# Patient Record
Sex: Female | Born: 1965 | Race: White | Hispanic: No | Marital: Married | Smoking: Former smoker
Health system: Southern US, Community
[De-identification: ages and names within clinical notes are randomized; demographics above are authoritative.]

## PROBLEM LIST (undated history)

## (undated) DIAGNOSIS — T7840XA Allergy, unspecified, initial encounter: Secondary | ICD-10-CM

## (undated) DIAGNOSIS — M1990 Unspecified osteoarthritis, unspecified site: Secondary | ICD-10-CM

## (undated) DIAGNOSIS — F419 Anxiety disorder, unspecified: Secondary | ICD-10-CM

## (undated) DIAGNOSIS — J45909 Unspecified asthma, uncomplicated: Secondary | ICD-10-CM

## (undated) DIAGNOSIS — F4310 Post-traumatic stress disorder, unspecified: Secondary | ICD-10-CM

## (undated) DIAGNOSIS — R0902 Hypoxemia: Secondary | ICD-10-CM

## (undated) DIAGNOSIS — J449 Chronic obstructive pulmonary disease, unspecified: Secondary | ICD-10-CM

## (undated) DIAGNOSIS — F32A Depression, unspecified: Secondary | ICD-10-CM

## (undated) DIAGNOSIS — J439 Emphysema, unspecified: Secondary | ICD-10-CM

## (undated) HISTORY — DX: Chronic obstructive pulmonary disease, unspecified: J44.9

## (undated) HISTORY — DX: Hypoxemia: R09.02

## (undated) HISTORY — DX: Depression, unspecified: F32.A

## (undated) HISTORY — PX: ABDOMINAL HYSTERECTOMY: SHX81

## (undated) HISTORY — DX: Post-traumatic stress disorder, unspecified: F43.10

## (undated) HISTORY — DX: Anxiety disorder, unspecified: F41.9

## (undated) HISTORY — PX: OTHER SURGICAL HISTORY: SHX169

## (undated) HISTORY — PX: BREAST EXCISIONAL BIOPSY: SUR124

## (undated) HISTORY — DX: Unspecified osteoarthritis, unspecified site: M19.90

## (undated) HISTORY — DX: Allergy, unspecified, initial encounter: T78.40XA

## (undated) HISTORY — PX: TONSILLECTOMY: SUR1361

## (undated) HISTORY — DX: Emphysema, unspecified: J43.9

---

## 1998-09-30 ENCOUNTER — Inpatient Hospital Stay (HOSPITAL_COMMUNITY): Admission: RE | Admit: 1998-09-30 | Discharge: 1998-10-01 | Payer: Self-pay | Admitting: Gynecology

## 1998-09-30 ENCOUNTER — Encounter (INDEPENDENT_AMBULATORY_CARE_PROVIDER_SITE_OTHER): Payer: Self-pay | Admitting: Specialist

## 1999-09-09 ENCOUNTER — Other Ambulatory Visit: Admission: RE | Admit: 1999-09-09 | Discharge: 1999-09-09 | Payer: Self-pay | Admitting: Gynecology

## 2001-05-16 ENCOUNTER — Inpatient Hospital Stay (HOSPITAL_COMMUNITY): Admission: EM | Admit: 2001-05-16 | Discharge: 2001-05-21 | Payer: Self-pay | Admitting: Psychiatry

## 2001-05-22 ENCOUNTER — Other Ambulatory Visit (HOSPITAL_COMMUNITY): Admission: RE | Admit: 2001-05-22 | Discharge: 2001-06-04 | Payer: Self-pay | Admitting: Psychiatry

## 2005-08-27 ENCOUNTER — Emergency Department (HOSPITAL_COMMUNITY): Admission: EM | Admit: 2005-08-27 | Discharge: 2005-08-27 | Payer: Self-pay | Admitting: Emergency Medicine

## 2009-02-20 ENCOUNTER — Emergency Department (HOSPITAL_COMMUNITY): Admission: EM | Admit: 2009-02-20 | Discharge: 2009-02-20 | Payer: Self-pay | Admitting: Family Medicine

## 2009-10-12 ENCOUNTER — Emergency Department (HOSPITAL_COMMUNITY): Admission: EM | Admit: 2009-10-12 | Discharge: 2009-10-12 | Payer: Self-pay | Admitting: Family Medicine
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
## 2013-07-24 ENCOUNTER — Encounter (HOSPITAL_COMMUNITY): Payer: Self-pay | Admitting: Emergency Medicine

## 2013-07-24 ENCOUNTER — Emergency Department (HOSPITAL_COMMUNITY): Payer: BC Managed Care – PPO

## 2013-07-24 ENCOUNTER — Emergency Department (HOSPITAL_COMMUNITY)
Admission: EM | Admit: 2013-07-24 | Discharge: 2013-07-24 | Disposition: A | Discharge status: Home / Self Care | Payer: BC Managed Care – PPO | Attending: Emergency Medicine | Admitting: Emergency Medicine

## 2013-07-24 DIAGNOSIS — F17200 Nicotine dependence, unspecified, uncomplicated: Secondary | ICD-10-CM | POA: Insufficient documentation

## 2013-07-24 DIAGNOSIS — Y929 Unspecified place or not applicable: Secondary | ICD-10-CM | POA: Insufficient documentation

## 2013-07-24 DIAGNOSIS — X500XXA Overexertion from strenuous movement or load, initial encounter: Secondary | ICD-10-CM | POA: Insufficient documentation

## 2013-07-24 DIAGNOSIS — S93401A Sprain of unspecified ligament of right ankle, initial encounter: Secondary | ICD-10-CM

## 2013-07-24 DIAGNOSIS — S93409A Sprain of unspecified ligament of unspecified ankle, initial encounter: Secondary | ICD-10-CM | POA: Insufficient documentation

## 2013-07-24 DIAGNOSIS — Y9389 Activity, other specified: Secondary | ICD-10-CM | POA: Insufficient documentation

## 2013-07-24 MED ORDER — IBUPROFEN 800 MG PO TABS: 800.0000 mg | ORAL_TABLET | Freq: Three times a day (TID) | ORAL | Status: DC

## 2013-07-24 NOTE — Discharge Instructions (Signed)
Ankle Sprain °An ankle sprain is an injury to the strong, fibrous tissues (ligaments) that hold the bones of your ankle joint together.  °CAUSES °An ankle sprain is usually caused by a fall or by twisting your ankle. Ankle sprains most commonly occur when you step on the outer edge of your foot, and your ankle turns inward. People who participate in sports are more prone to these types of injuries.  °SYMPTOMS  °· Pain in your ankle. The pain may be present at rest or only when you are trying to stand or walk. °· Swelling. °· Bruising. Bruising may develop immediately or within 1 to 2 days after your injury. °· Difficulty standing or walking, particularly when turning corners or changing directions. °DIAGNOSIS  °Your caregiver will ask you details about your injury and perform a physical exam of your ankle to determine if you have an ankle sprain. During the physical exam, your caregiver will press on and apply pressure to specific areas of your foot and ankle. Your caregiver will try to move your ankle in certain ways. An X-ray exam may be done to be sure a bone was not broken or a ligament did not separate from one of the bones in your ankle (avulsion fracture).  °TREATMENT  °Certain types of braces can help stabilize your ankle. Your caregiver can make a recommendation for this. Your caregiver may recommend the use of medicine for pain. If your sprain is severe, your caregiver may refer you to a surgeon who helps to restore function to parts of your skeletal system (orthopedist) or a physical therapist. °HOME CARE INSTRUCTIONS  °· Apply ice to your injury for 1-2 days or as directed by your caregiver. Applying ice helps to reduce inflammation and pain. °¨ Put ice in a plastic bag. °¨ Place a towel between your skin and the bag. °¨ Leave the ice on for 15-20 minutes at a time, every 2 hours while you are awake. °· Only take over-the-counter or prescription medicines for pain, discomfort, or fever as directed by  your caregiver. °· Elevate your injured ankle above the level of your heart as much as possible for 2-3 days. °· If your caregiver recommends crutches, use them as instructed. Gradually put weight on the affected ankle. Continue to use crutches or a cane until you can walk without feeling pain in your ankle. °· If you have a plaster splint, wear the splint as directed by your caregiver. Do not rest it on anything harder than a pillow for the first 24 hours. Do not put weight on it. Do not get it wet. You may take it off to take a shower or bath. °· You may have been given an elastic bandage to wear around your ankle to provide support. If the elastic bandage is too tight (you have numbness or tingling in your foot or your foot becomes cold and blue), adjust the bandage to make it comfortable. °· If you have an air splint, you may blow more air into it or let air out to make it more comfortable. You may take your splint off at night and before taking a shower or bath. Wiggle your toes in the splint several times per day to decrease swelling. °SEEK MEDICAL CARE IF:  °· You have rapidly increasing bruising or swelling. °· Your toes feel extremely cold or you lose feeling in your foot. °· Your pain is not relieved with medicine. °SEEK IMMEDIATE MEDICAL CARE IF: °· Your toes are numb or blue. °·   You have severe pain that is increasing. °MAKE SURE YOU:  °· Understand these instructions. °· Will watch your condition. °· Will get help right away if you are not doing well or get worse. °Document Released: 12/20/2004 Document Revised: 09/14/2011 Document Reviewed: 01/01/2011 °ExitCare® Patient Information ©2015 [employer]. This information is not intended to replace advice given to you by your health care provider. Make sure you discuss any questions you have with your health care provider. ° °Cryotherapy °Cryotherapy means treatment with cold. Ice or gel packs can be used to reduce both pain and swelling. Ice is the most  helpful within the first 24 to 48 hours after an injury or flareup from overusing a muscle or joint. Sprains, strains, spasms, burning pain, shooting pain, and aches can all be eased with ice. Ice can also be used when recovering from surgery. Ice is effective, has very few side effects, and is safe for most people to use. °PRECAUTIONS  °Ice is not a safe treatment option for people with: °· Raynaud's phenomenon. This is a condition affecting small blood vessels in the extremities. Exposure to cold may cause your problems to return. °· Cold hypersensitivity. There are many forms of cold hypersensitivity, including: °¨ Cold urticaria. Red, itchy hives appear on the skin when the tissues begin to warm after being iced. °¨ Cold erythema. This is a red, itchy rash caused by exposure to cold. °¨ Cold hemoglobinuria. Red blood cells break down when the tissues begin to warm after being iced. The hemoglobin that carry oxygen are passed into the urine because they cannot combine with blood proteins fast enough. °· Numbness or altered sensitivity in the area being iced. °If you have any of the following conditions, do not use ice until you have discussed cryotherapy with your caregiver: °· Heart conditions, such as arrhythmia, angina, or chronic heart disease. °· High blood pressure. °· Healing wounds or open skin in the area being iced. °· Current infections. °· Rheumatoid arthritis. °· Poor circulation. °· Diabetes. °Ice slows the blood flow in the region it is applied. This is beneficial when trying to stop inflamed tissues from spreading irritating chemicals to surrounding tissues. However, if you expose your skin to cold temperatures for too long or without the proper protection, you can damage your skin or nerves. Watch for signs of skin damage due to cold. °HOME CARE INSTRUCTIONS °Follow these tips to use ice and cold packs safely. °· Place a dry or damp towel between the ice and skin. A damp towel will cool the skin  more quickly, so you may need to shorten the time that the ice is used. °· For a more rapid response, add gentle compression to the ice. °· Ice for no more than 10 to 20 minutes at a time. The bonier the area you are icing, the less time it will take to get the benefits of ice. °· Check your skin after 5 minutes to make sure there are no signs of a poor response to cold or skin damage. °· Rest 20 minutes or more in between uses. °· Once your skin is numb, you can end your treatment. You can test numbness by very lightly touching your skin. The touch should be so light that you do not see the skin dimple from the pressure of your fingertip. When using ice, most people will feel these normal sensations in this order: cold, burning, aching, and numbness. °· Do not use ice on someone who cannot communicate their responses   to pain, such as small children or people with dementia. °HOW TO MAKE AN ICE PACK °Ice packs are the most common way to use ice therapy. Other methods include ice massage, ice baths, and cryo-sprays. Muscle creams that cause a cold, tingly feeling do not offer the same benefits that ice offers and should not be used as a substitute unless recommended by your caregiver. °To make an ice pack, do one of the following: °· Place crushed ice or a bag of frozen vegetables in a sealable plastic bag. Squeeze out the excess air. Place this bag inside another plastic bag. Slide the bag into a pillowcase or place a damp towel between your skin and the bag. °· Mix 3 parts water with 1 part rubbing alcohol. Freeze the mixture in a sealable plastic bag. When you remove the mixture from the freezer, it will be slushy. Squeeze out the excess air. Place this bag inside another plastic bag. Slide the bag into a pillowcase or place a damp towel between your skin and the bag. °SEEK MEDICAL CARE IF: °· You develop white spots on your skin. This may give the skin a blotchy (mottled) appearance. °· Your skin turns blue or  pale. °· Your skin becomes waxy or hard. °· Your swelling gets worse. °MAKE SURE YOU:  °· Understand these instructions. °· Will watch your condition. °· Will get help right away if you are not doing well or get worse. °Document Released: 08/16/2010 Document Revised: 03/14/2011 Document Reviewed: 08/16/2010 °ExitCare® Patient Information ©2015 [employer]. This information is not intended to replace advice given to you by your health care provider. Make sure you discuss any questions you have with your health care provider. ° °

## 2013-07-24 NOTE — ED Provider Notes (Signed)
Medical screening examination/treatment/procedure(s) were performed by non-physician practitioner and as supervising physician I was immediately available for consultation/collaboration.   EKG Interpretation None      Devoria AlbeIva Latoya Diskin, MD, Armando GangFACEP   Ward GivensIva L Vinie Charity, MD 07/24/13 [phone]

## 2013-07-24 NOTE — ED Notes (Signed)
Pt presents with c/o right foot injury. Pt says that around 7 this morning she stepped on to some uneven concrete, has had trouble putting pressure on the foot since then. Pt does have some swelling to that foot.

## 2013-07-24 NOTE — ED Notes (Signed)
Patient transported to X-ray 

## 2013-07-24 NOTE — ED Provider Notes (Signed)
CSN: [id number]     Arrival date & time 07/24/13  [id number] History   First MD Initiated Contact with Patient 07/24/13 1014     Chief Complaint  Patient presents with  . Foot Injury     (Consider location/radiation/quality/duration/timing/severity/associated sxs/prior Treatment) Patient is a 48 y.o. female presenting with foot injury. The history is provided by the patient. No language interpreter was used.  Foot Injury Location:  Ankle Ankle location:  R ankle Associated symptoms: no fever   Associated symptoms comment:  Right ankle swelling after inversion injury earlier this morning. No other injury.    History reviewed. No pertinent past medical history. Past Surgical History  Procedure Laterality Date  . Abdominal hysterectomy     No family history on file. History  Substance Use Topics  . Smoking status: Current Every Day Smoker  . Smokeless tobacco: Not on file  . Alcohol Use: Yes     Comment: rarely    OB History   Grav Para Term Preterm Abortions TAB SAB Ect Mult Living                 Review of Systems  Constitutional: Negative for fever and chills.  Musculoskeletal:       See HPI.  Skin: Negative.   Neurological: Negative.  Negative for numbness.      Allergies  Codeine  Home Medications   Prior to Admission medications   Not on File   BP 115/78  Pulse 82  Temp(Src) 97.9 F (36.6 C) (Oral)  Resp 14  SpO2 98% Physical Exam  Constitutional: She is oriented to person, place, and time. She appears well-developed and well-nourished.  Neck: Normal range of motion.  Pulmonary/Chest: Effort normal.  Musculoskeletal:  Right ankle swollen laterally without discoloration or bony deformity. Joint stable. Achilles intact. FROM distally.   Neurological: She is alert and oriented to person, place, and time.  Skin: Skin is warm and dry.  Psychiatric: She has a normal mood and affect.    ED Course  Procedures (including critical care time) Labs  Review Labs Reviewed - No data to display  Imaging Review Dg Ankle Complete Right  07/24/2013   CLINICAL DATA:  Lateral right ankle pain and swelling status post injury  EXAM: RIGHT ANKLE - COMPLETE 3+ VIEW  COMPARISON:  None.  FINDINGS: The ankle joint mortise is preserved. The talar dome is intact. There is no acute malleolar fracture. There is soft tissue swelling laterally. There is a 2.5 x 4.5 mm bony density adjacent to the inferior aspect of the distal fibula and the adjacent talus. A definite avulsion fracture donor site is not demonstrated. The other hindfoot bones are normal.  IMPRESSION: There is soft tissue swelling laterally. There is no definite evidence of an acute fracture. An accessory fibular ossicle or old avulsion fracture fragment is present along the inferior aspect of the distal fibula.   Electronically Signed   By: David  SwazilandJordan   On: 07/24/2013 11:28     EKG Interpretation None      MDM   Final diagnoses:  None    1. Right ankle sprain  Uncomplicated ankle sprain without suspicion of worse underlying injury.    Arnoldo HookerShari A Atilla Zollner, PA-C 07/24/13 1158

## 2013-10-29 ENCOUNTER — Encounter (HOSPITAL_COMMUNITY): Payer: Self-pay | Admitting: Emergency Medicine

## 2013-10-29 ENCOUNTER — Emergency Department (HOSPITAL_COMMUNITY)
Admission: EM | Admit: 2013-10-29 | Discharge: 2013-10-29 | Disposition: A | Discharge status: Home / Self Care | Payer: BC Managed Care – PPO | Source: Home / Self Care | Attending: Family Medicine | Admitting: Family Medicine

## 2013-10-29 ENCOUNTER — Ambulatory Visit (HOSPITAL_COMMUNITY): Payer: BC Managed Care – PPO | Attending: Family Medicine

## 2013-10-29 DIAGNOSIS — R0602 Shortness of breath: Secondary | ICD-10-CM | POA: Diagnosis not present

## 2013-10-29 DIAGNOSIS — R05 Cough: Secondary | ICD-10-CM | POA: Insufficient documentation

## 2013-10-29 DIAGNOSIS — J441 Chronic obstructive pulmonary disease with (acute) exacerbation: Secondary | ICD-10-CM

## 2013-10-29 DIAGNOSIS — R509 Fever, unspecified: Secondary | ICD-10-CM | POA: Diagnosis not present

## 2013-10-29 HISTORY — DX: Unspecified asthma, uncomplicated: J45.909

## 2013-10-29 MED ORDER — IPRATROPIUM BROMIDE 0.02 % IN SOLN
RESPIRATORY_TRACT | Status: AC
  Filled 2013-10-29: qty 2.5

## 2013-10-29 MED ORDER — ALBUTEROL SULFATE (2.5 MG/3ML) 0.083% IN NEBU
5.0000 mg | INHALATION_SOLUTION | Freq: Once | RESPIRATORY_TRACT | Status: AC
  Administered 2013-10-29: 5 mg via RESPIRATORY_TRACT

## 2013-10-29 MED ORDER — MINOCYCLINE HCL 100 MG PO CAPS: 100.0000 mg | ORAL_CAPSULE | Freq: Two times a day (BID) | ORAL | Status: DC

## 2013-10-29 MED ORDER — METHYLPREDNISOLONE 4 MG PO KIT: PACK | ORAL | Status: DC

## 2013-10-29 MED ORDER — METHYLPREDNISOLONE SODIUM SUCC 125 MG IJ SOLR
INTRAMUSCULAR | Status: AC
  Filled 2013-10-29: qty 2

## 2013-10-29 MED ORDER — ALBUTEROL SULFATE (2.5 MG/3ML) 0.083% IN NEBU
INHALATION_SOLUTION | RESPIRATORY_TRACT | Status: AC
  Filled 2013-10-29: qty 6

## 2013-10-29 MED ORDER — METHYLPREDNISOLONE SODIUM SUCC 125 MG IJ SOLR
125.0000 mg | Freq: Once | INTRAMUSCULAR | Status: AC
  Administered 2013-10-29: 125 mg via INTRAMUSCULAR

## 2013-10-29 MED ORDER — IPRATROPIUM BROMIDE 0.02 % IN SOLN
0.5000 mg | Freq: Once | RESPIRATORY_TRACT | Status: AC
  Administered 2013-10-29: 0.5 mg via RESPIRATORY_TRACT

## 2013-10-29 NOTE — ED Provider Notes (Signed)
CSN: [id number]     Arrival date & time 10/29/13  1122 History   First MD Initiated Contact with Patient 10/29/13 1133     Chief Complaint  Patient presents with  . Cough   (Consider location/radiation/quality/duration/timing/severity/associated sxs/prior Treatment) Patient is a 48 y.o. female presenting with shortness of breath. The history is provided by the patient.  Shortness of Breath Severity:  Moderate Onset quality:  Gradual Duration:  1 week Progression:  Worsening Chronicity:  Chronic Context: smoke exposure   Ineffective treatments:  Inhaler Associated symptoms: wheezing   Associated symptoms: no fever   Risk factors: tobacco use     Past Medical History  Diagnosis Date  . Asthma    Past Surgical History  Procedure Laterality Date  . Abdominal hysterectomy    . Tonsillectomy     History reviewed. No pertinent family history. History  Substance Use Topics  . Smoking status: Current Every Day Smoker -- 1.00 packs/day  . Smokeless tobacco: Not on file  . Alcohol Use: Yes     Comment: rarely    OB History   Grav Para Term Preterm Abortions TAB SAB Ect Mult Living                 Review of Systems  Constitutional: Negative.  Negative for fever.  Respiratory: Positive for chest tightness, shortness of breath and wheezing.   Cardiovascular: Negative.  Negative for leg swelling.  Gastrointestinal: Negative.     Allergies  Codeine  Home Medications   Prior to Admission medications   Medication Sig Start Date End Date Taking? Authorizing Provider  ibuprofen (ADVIL,MOTRIN) 800 MG tablet Take 1 tablet (800 mg total) by mouth 3 (three) times daily. 07/24/13   Shari A Upstill, PA-C  methylPREDNISolone (MEDROL DOSEPAK) 4 MG tablet follow package directions, take until finished, start on wed. 10/29/13   Linna HoffJames D Jaquia Benedicto, MD  minocycline (MINOCIN,DYNACIN) 100 MG capsule Take 1 capsule (100 mg total) by mouth 2 (two) times daily. 10/29/13   Linna HoffJames D Jean Alejos, MD   BP  123/85  Pulse 76  Temp(Src) 99 F (37.2 C) (Oral)  Resp 16  SpO2 97% Physical Exam  Nursing note and vitals reviewed. Constitutional: She is oriented to person, place, and time. She appears well-developed and well-nourished. She appears distressed.  HENT:  Right Ear: External ear normal.  Left Ear: External ear normal.  Mouth/Throat: Oropharynx is clear and moist.  Eyes: Conjunctivae are normal. Pupils are equal, round, and reactive to light.  Neck: Normal range of motion. Neck supple.  Cardiovascular: Regular rhythm, normal heart sounds and intact distal pulses.   Pulmonary/Chest: Tachypnea noted. No respiratory distress. She has decreased breath sounds. She has wheezes.  Abdominal: Soft. Bowel sounds are normal.  Lymphadenopathy:    She has no cervical adenopathy.  Neurological: She is alert and oriented to person, place, and time.  Skin: Skin is warm and dry.    ED Course  Procedures (including critical care time) Labs Review Labs Reviewed - No data to display  Imaging Review Dg Chest 2 View  10/29/2013   CLINICAL DATA:  Six day history of productive cough and low grade fever. Shortness of breath beginning 3 days ago. Thirty year history of smoking.  EXAM: CHEST  2 VIEW  COMPARISON:  02/20/2009  FINDINGS: Lungs are hyperexpanded with flattened hemidiaphragms consistent with COPD. No lung consolidation or edema. No lung mass or nodule. Mild scarring is noted in the upper lobes at the apices, unchanged.  No  pleural effusion or pneumothorax.  Heart, mediastinum hila are unremarkable.  Bony thorax is intact.  IMPRESSION: 1. No acute cardiopulmonary disease. 2. COPD.   Electronically Signed   By: Amie Portlandavid  Ormond M.D.   On: 10/29/2013 12:52   X-rays reviewed and report per radiologist.   MDM   1. SOB (shortness of breath)   2. Acute exacerbation of chronic obstructive airways disease    Sx improved, lungs clear at time of d/c    Linna HoffJames D Creighton Longley, MD 10/29/13 [phone]

## 2013-10-29 NOTE — ED Notes (Signed)
Pt states that she has been coughing for over a week with no relief. Cough is continues to get worse. Pt denies pain at this time.

## 2013-10-29 NOTE — Discharge Instructions (Signed)
Take all of medicine, drink lots of fluids, no more smoking, see your doctor if further problems  °

## 2015-05-19 ENCOUNTER — Ambulatory Visit (INDEPENDENT_AMBULATORY_CARE_PROVIDER_SITE_OTHER): Payer: BLUE CROSS/BLUE SHIELD

## 2015-05-19 ENCOUNTER — Encounter (HOSPITAL_COMMUNITY): Payer: Self-pay | Admitting: *Deleted

## 2015-05-19 ENCOUNTER — Ambulatory Visit (HOSPITAL_COMMUNITY)
Admission: EM | Admit: 2015-05-19 | Discharge: 2015-05-19 | Disposition: A | Discharge status: Home / Self Care | Payer: BLUE CROSS/BLUE SHIELD | Attending: Family Medicine | Admitting: Family Medicine

## 2015-05-19 DIAGNOSIS — J441 Chronic obstructive pulmonary disease with (acute) exacerbation: Secondary | ICD-10-CM

## 2015-05-19 MED ORDER — PREDNISONE 50 MG PO TABS: ORAL_TABLET | ORAL | Status: DC

## 2015-05-19 MED ORDER — TIOTROPIUM BROMIDE MONOHYDRATE 18 MCG IN CAPS
18.0000 ug | ORAL_CAPSULE | Freq: Every morning | RESPIRATORY_TRACT | Status: DC
Start: 2015-05-19 — End: 2015-05-19

## 2015-05-19 MED ORDER — METHYLPREDNISOLONE SODIUM SUCC 125 MG IJ SOLR
125.0000 mg | Freq: Once | INTRAMUSCULAR | Status: AC
  Administered 2015-05-19: 125 mg via INTRAMUSCULAR

## 2015-05-19 MED ORDER — TIOTROPIUM BROMIDE MONOHYDRATE 18 MCG IN CAPS: 18.0000 ug | ORAL_CAPSULE | Freq: Every morning | RESPIRATORY_TRACT | Status: DC

## 2015-05-19 MED ORDER — IPRATROPIUM BROMIDE 0.02 % IN SOLN
0.5000 mg | Freq: Once | RESPIRATORY_TRACT | Status: AC
  Administered 2015-05-19: 0.5 mg via RESPIRATORY_TRACT

## 2015-05-19 MED ORDER — ALBUTEROL SULFATE (2.5 MG/3ML) 0.083% IN NEBU
INHALATION_SOLUTION | RESPIRATORY_TRACT | Status: AC
  Filled 2015-05-19: qty 6

## 2015-05-19 MED ORDER — ALBUTEROL SULFATE (2.5 MG/3ML) 0.083% IN NEBU
5.0000 mg | INHALATION_SOLUTION | Freq: Once | RESPIRATORY_TRACT | Status: AC
  Administered 2015-05-19: 5 mg via RESPIRATORY_TRACT

## 2015-05-19 MED ORDER — METHYLPREDNISOLONE SODIUM SUCC 125 MG IJ SOLR
INTRAMUSCULAR | Status: AC
  Filled 2015-05-19: qty 2

## 2015-05-19 MED ORDER — IPRATROPIUM BROMIDE 0.02 % IN SOLN
RESPIRATORY_TRACT | Status: AC
  Filled 2015-05-19: qty 2.5

## 2015-05-19 NOTE — Discharge Instructions (Signed)
Drink plenty of water, use medicine as prescribed, see your doctor as needed.

## 2015-05-19 NOTE — ED Provider Notes (Signed)
CSN: [id number]     Arrival date & time 05/19/15  1751 History   First MD Initiated Contact with Patient 05/19/15 1820     Chief Complaint  Patient presents with  . Cough   (Consider location/radiation/quality/duration/timing/severity/associated sxs/prior Treatment) Patient is a 50 y.o. female presenting with cough. The history is provided by the patient and the spouse.  Cough Cough characteristics:  Productive Sputum characteristics:  Yellow Severity:  Moderate Onset quality:  Gradual Duration:  4 weeks Progression:  Worsening Chronicity:  Chronic Smoker: yes   Ineffective treatments:  Beta-agonist inhaler Associated symptoms: shortness of breath and wheezing   Associated symptoms: no fever     Past Medical History  Diagnosis Date  . Asthma    Past Surgical History  Procedure Laterality Date  . Abdominal hysterectomy    . Tonsillectomy     History reviewed. No pertinent family history. Social History  Substance Use Topics  . Smoking status: Current Every Day Smoker -- 1.00 packs/day  . Smokeless tobacco: None  . Alcohol Use: Yes     Comment: rarely    OB History    No data available     Review of Systems  Constitutional: Negative.  Negative for fever.  HENT: Positive for congestion and postnasal drip.   Respiratory: Positive for cough, shortness of breath and wheezing.   Cardiovascular: Negative.   All other systems reviewed and are negative.   Allergies  Codeine  Home Medications   Prior to Admission medications   Medication Sig Start Date End Date Taking? Authorizing Provider  ibuprofen (ADVIL,MOTRIN) 800 MG tablet Take 1 tablet (800 mg total) by mouth 3 (three) times daily. 07/24/13   Elpidio AnisShari Upstill, PA-C  methylPREDNISolone (MEDROL DOSEPAK) 4 MG tablet follow package directions, take until finished, start on wed. 10/29/13   Linna HoffJames D Borghild Thaker, MD  minocycline (MINOCIN,DYNACIN) 100 MG capsule Take 1 capsule (100 mg total) by mouth 2 (two) times daily.  10/29/13   Linna HoffJames D Ling Flesch, MD  predniSONE (DELTASONE) 50 MG tablet 1 tab daily for 2 days then 1/2 tab daily for 2 days. 05/19/15   Linna HoffJames D Christan Defranco, MD  tiotropium (SPIRIVA) 18 MCG inhalation capsule Place 1 capsule (18 mcg total) into inhaler and inhale every morning. 05/19/15   Linna HoffJames D Latayna Ritchie, MD   Meds Ordered and Administered this Visit   Medications  ipratropium (ATROVENT) nebulizer solution 0.5 mg (0.5 mg Nebulization Given 05/19/15 1846)  albuterol (PROVENTIL) (2.5 MG/3ML) 0.083% nebulizer solution 5 mg (5 mg Nebulization Given 05/19/15 1845)  methylPREDNISolone sodium succinate (SOLU-MEDROL) 125 mg/2 mL injection 125 mg (125 mg Intramuscular Given 05/19/15 1846)    BP 133/72 mmHg  Pulse 102  Temp(Src) 98.5 F (36.9 C) (Oral)  Resp 28  SpO2 96% No data found.   Physical Exam  Constitutional: She is oriented to person, place, and time. She appears well-developed and well-nourished. No distress.  HENT:  Right Ear: External ear normal.  Left Ear: External ear normal.  Mouth/Throat: Oropharynx is clear and moist.  Neck: Normal range of motion. Neck supple.  Cardiovascular: Normal rate, regular rhythm, normal heart sounds and intact distal pulses.   Pulmonary/Chest: Effort normal. No respiratory distress. She has wheezes.  Lymphadenopathy:    She has no cervical adenopathy.  Neurological: She is alert and oriented to person, place, and time.  Skin: Skin is warm and dry.  Nursing note and vitals reviewed.   ED Course  Procedures (including critical care time)  Labs Review Labs Reviewed -  No data to display  Imaging Review Dg Chest 2 View  05/19/2015  CLINICAL DATA:  Sick for 2 days, loss of voice, bad cough, low-grade fever, smoker, history childhood asthma, pneumonia, bronchitis EXAM: CHEST  2 VIEW COMPARISON:  10/29/2013 FINDINGS: Normal heart size, mediastinal contours, and pulmonary vascularity. Lungs hyperinflated compatible with COPD. Chronic interstitial prominence again  identified, perhaps minimally increased. Mild RIGHT apex scarring. No definite acute infiltrate, pleural effusion or pneumothorax. Bones unremarkable. IMPRESSION: COPD changes with RIGHT apical scarring. Slight progression of chronic interstitial lung disease since 2015. Electronically Signed   By: Ulyses Southward M.D.   On: 05/19/2015 18:45   X-rays reviewed and report per radiologist.   Visual Acuity Review  Right Eye Distance:   Left Eye Distance:   Bilateral Distance:    Right Eye Near:   Left Eye Near:    Bilateral Near:         MDM   1. COPD with acute exacerbation (HCC)    Lungs clear and sx improved at d/c.    Linna Hoff, MD 05/19/15 [phone]

## 2015-05-19 NOTE — ED Notes (Signed)
Pt  Has   A  History  Of  Copd   /  Bronchitis        She  Is  A   Smoker    She reports     Symptoms  For  About  1  Month       Pt   Reports    She has  Used  Inhaler  In  Past     But has  Ran out

## 2020-11-02 ENCOUNTER — Observation Stay (HOSPITAL_COMMUNITY)
Admission: EM | Admit: 2020-11-02 | Discharge: 2020-11-03 | Disposition: A | Discharge status: Home / Self Care | Payer: BC Managed Care – PPO | Attending: Family Medicine | Admitting: Family Medicine

## 2020-11-02 ENCOUNTER — Emergency Department (HOSPITAL_COMMUNITY): Payer: BC Managed Care – PPO

## 2020-11-02 ENCOUNTER — Other Ambulatory Visit: Payer: Self-pay

## 2020-11-02 DIAGNOSIS — J101 Influenza due to other identified influenza virus with other respiratory manifestations: Secondary | ICD-10-CM | POA: Diagnosis not present

## 2020-11-02 DIAGNOSIS — J9621 Acute and chronic respiratory failure with hypoxia: Principal | ICD-10-CM | POA: Insufficient documentation

## 2020-11-02 DIAGNOSIS — E876 Hypokalemia: Secondary | ICD-10-CM | POA: Diagnosis not present

## 2020-11-02 DIAGNOSIS — Z20822 Contact with and (suspected) exposure to covid-19: Secondary | ICD-10-CM | POA: Diagnosis not present

## 2020-11-02 DIAGNOSIS — Z8616 Personal history of COVID-19: Secondary | ICD-10-CM | POA: Diagnosis not present

## 2020-11-02 DIAGNOSIS — F17200 Nicotine dependence, unspecified, uncomplicated: Secondary | ICD-10-CM | POA: Diagnosis not present

## 2020-11-02 DIAGNOSIS — F17210 Nicotine dependence, cigarettes, uncomplicated: Secondary | ICD-10-CM | POA: Insufficient documentation

## 2020-11-02 DIAGNOSIS — Z79899 Other long term (current) drug therapy: Secondary | ICD-10-CM | POA: Insufficient documentation

## 2020-11-02 DIAGNOSIS — J9601 Acute respiratory failure with hypoxia: Secondary | ICD-10-CM | POA: Diagnosis present

## 2020-11-02 DIAGNOSIS — R0602 Shortness of breath: Secondary | ICD-10-CM | POA: Diagnosis present

## 2020-11-02 DIAGNOSIS — J45909 Unspecified asthma, uncomplicated: Secondary | ICD-10-CM | POA: Insufficient documentation

## 2020-11-02 LAB — CBC WITH DIFFERENTIAL/PLATELET
Abs Immature Granulocytes: 0.02 10*3/uL (ref 0.00–0.07)
Basophils Absolute: 0.1 10*3/uL (ref 0.0–0.1)
Basophils Relative: 1 %
Eosinophils Absolute: 0.2 10*3/uL (ref 0.0–0.5)
Eosinophils Relative: 2 %
HCT: 43.4 % (ref 36.0–46.0)
Hemoglobin: 14.2 g/dL (ref 12.0–15.0)
Immature Granulocytes: 0 %
Lymphocytes Relative: 26 %
Lymphs Abs: 2.2 10*3/uL (ref 0.7–4.0)
MCH: 29.5 pg (ref 26.0–34.0)
MCHC: 32.7 g/dL (ref 30.0–36.0)
MCV: 90.2 fL (ref 80.0–100.0)
Monocytes Absolute: 0.3 10*3/uL (ref 0.1–1.0)
Monocytes Relative: 3 %
Neutro Abs: 5.6 10*3/uL (ref 1.7–7.7)
Neutrophils Relative %: 68 %
Platelets: 338 10*3/uL (ref 150–400)
RBC: 4.81 MIL/uL (ref 3.87–5.11)
RDW: 13.4 % (ref 11.5–15.5)
WBC: 8.3 10*3/uL (ref 4.0–10.5)
nRBC: 0 % (ref 0.0–0.2)

## 2020-11-02 LAB — COMPREHENSIVE METABOLIC PANEL
ALT: 14 U/L (ref 0–44)
AST: 21 U/L (ref 15–41)
Albumin: 4 g/dL (ref 3.5–5.0)
Alkaline Phosphatase: 52 U/L (ref 38–126)
Anion gap: 10 (ref 5–15)
BUN: 10 mg/dL (ref 6–20)
CO2: 23 mmol/L (ref 22–32)
Calcium: 8.8 mg/dL — ABNORMAL LOW (ref 8.9–10.3)
Chloride: 105 mmol/L (ref 98–111)
Creatinine, Ser: 0.66 mg/dL (ref 0.44–1.00)
GFR, Estimated: >60 mL/min (ref >60)
Glucose, Bld: 198 mg/dL — ABNORMAL HIGH (ref 70–99)
Potassium: 3.1 mmol/L — ABNORMAL LOW (ref 3.5–5.1)
Sodium: 138 mmol/L (ref 135–145)
Total Bilirubin: 0.4 mg/dL (ref 0.3–1.2)
Total Protein: 6.9 g/dL (ref 6.5–8.1)

## 2020-11-02 LAB — I-STAT VENOUS BLOOD GAS, ED
Acid-base deficit: 3 mmol/L — ABNORMAL HIGH (ref 0.0–2.0)
Bicarbonate: 22.2 mmol/L (ref 20.0–28.0)
Calcium, Ion: 1.13 mmol/L — ABNORMAL LOW (ref 1.15–1.40)
HCT: 43 % (ref 36.0–46.0)
Hemoglobin: 14.6 g/dL (ref 12.0–15.0)
O2 Saturation: 90 %
Potassium: 3.3 mmol/L — ABNORMAL LOW (ref 3.5–5.1)
Sodium: 141 mmol/L (ref 135–145)
TCO2: 23 mmol/L (ref 22–32)
pCO2, Ven: 41.3 mmHg — ABNORMAL LOW (ref 44.0–60.0)
pH, Ven: 7.338 (ref 7.250–7.430)
pO2, Ven: 62 mmHg — ABNORMAL HIGH (ref 32.0–45.0)

## 2020-11-02 LAB — D-DIMER, QUANTITATIVE: D-Dimer, Quant: <0.27 ug/mL-FEU (ref 0.00–0.50)

## 2020-11-02 LAB — RESP PANEL BY RT-PCR (FLU A&B, COVID) ARPGX2
Influenza A by PCR: POSITIVE — AB
Influenza B by PCR: NEGATIVE
SARS Coronavirus 2 by RT PCR: NEGATIVE

## 2020-11-02 LAB — BRAIN NATRIURETIC PEPTIDE: B Natriuretic Peptide: 14 pg/mL (ref 0.0–100.0)

## 2020-11-02 LAB — TROPONIN I (HIGH SENSITIVITY): Troponin I (High Sensitivity): 2 ng/L (ref <18)

## 2020-11-02 MED ORDER — ONDANSETRON 4 MG PO TBDP
4.0000 mg | ORAL_TABLET | Freq: Once | ORAL | Status: AC
  Administered 2020-11-02: 4 mg via ORAL
  Filled 2020-11-02: qty 1

## 2020-11-02 MED ORDER — OSELTAMIVIR PHOSPHATE 75 MG PO CAPS
75.0000 mg | ORAL_CAPSULE | Freq: Two times a day (BID) | ORAL | Status: DC
  Administered 2020-11-02 – 2020-11-03 (×2): 75 mg via ORAL
  Filled 2020-11-02 (×4): qty 1

## 2020-11-02 MED ORDER — IPRATROPIUM-ALBUTEROL 0.5-2.5 (3) MG/3ML IN SOLN
3.0000 mL | Freq: Four times a day (QID) | RESPIRATORY_TRACT | Status: DC
  Administered 2020-11-02 – 2020-11-03 (×4): 3 mL via RESPIRATORY_TRACT
  Filled 2020-11-02 (×5): qty 3

## 2020-11-02 MED ORDER — LORAZEPAM 2 MG/ML IJ SOLN: 0.5000 mg | Freq: Once | INTRAMUSCULAR | Status: DC | PRN

## 2020-11-02 MED ORDER — ONDANSETRON HCL 4 MG/2ML IJ SOLN
4.0000 mg | Freq: Once | INTRAMUSCULAR | Status: AC
  Administered 2020-11-02: 4 mg via INTRAVENOUS
  Filled 2020-11-02: qty 2

## 2020-11-02 MED ORDER — ALBUTEROL SULFATE (2.5 MG/3ML) 0.083% IN NEBU
10.0000 mg/h | INHALATION_SOLUTION | Freq: Once | RESPIRATORY_TRACT | Status: AC
  Administered 2020-11-02: 10 mg/h via RESPIRATORY_TRACT
  Filled 2020-11-02: qty 12

## 2020-11-02 MED ORDER — POTASSIUM CHLORIDE CRYS ER 20 MEQ PO TBCR
40.0000 meq | EXTENDED_RELEASE_TABLET | Freq: Once | ORAL | Status: AC
  Administered 2020-11-02: 40 meq via ORAL
  Filled 2020-11-02: qty 2

## 2020-11-02 MED ORDER — ONDANSETRON HCL 4 MG PO TABS: 4.0000 mg | ORAL_TABLET | Freq: Once | ORAL | Status: DC

## 2020-11-02 MED ORDER — NICOTINE 21 MG/24HR TD PT24
21.0000 mg | MEDICATED_PATCH | Freq: Every day | TRANSDERMAL | Status: DC
  Administered 2020-11-02 – 2020-11-03 (×2): 21 mg via TRANSDERMAL
  Filled 2020-11-02 (×2): qty 1

## 2020-11-02 MED ORDER — OSELTAMIVIR PHOSPHATE 75 MG PO CAPS: 75.0000 mg | ORAL_CAPSULE | Freq: Two times a day (BID) | ORAL | Status: DC

## 2020-11-02 MED ORDER — ENOXAPARIN SODIUM 40 MG/0.4ML IJ SOSY
40.0000 mg | PREFILLED_SYRINGE | INTRAMUSCULAR | Status: DC
  Administered 2020-11-02 – 2020-11-03 (×2): 40 mg via SUBCUTANEOUS
  Filled 2020-11-02 (×2): qty 0.4

## 2020-11-02 NOTE — ED Notes (Signed)
Patient is continuing to vomit, care team made aware

## 2020-11-02 NOTE — ED Notes (Signed)
Patient vommiting, Dr. Miquel Dunn paged at this time

## 2020-11-02 NOTE — ED Provider Notes (Signed)
I personally evaluated the patient during the encounter and completed a history, physical, procedures, medical decision making to contribute to the overall care of the patient and decision making for the patient briefly, the patient is a 55 y.o. female with history of asthma who presents the ED with shortness of breath, cough.  Patient on nonrebreather upon arrival.  Placed on BiPAP for increased work of breathing, diminished breath sounds throughout.  She was given breathing treatment, steroids, magnesium with EMS.  She has been having viral type symptoms the last several days.  She is a smoker and suppose it has asthma history.  She appears very uncomfortable and tachypneic upon arrival but on reevaluation after BiPAP she looks much improved.  Chest x-ray negative for infection.  No significant leukocytosis or anemia.  BNP within normal limits.  We will obtain a D-dimer, troponin, COVID swab.  Anticipate admission for what we suspect is hypoxia in the setting of respiratory disease from likely asthma/COPD.  Still awaiting lab work but patient has transitioned to 5 L of oxygen at this time.  Looking more comfortable.  This chart was dictated using voice recognition software.  Despite best efforts to proofread,  errors can occur which can change the documentation meaning.    EKG Interpretation None            Virgina Norfolk, DO 11/02/20 1546

## 2020-11-02 NOTE — ED Triage Notes (Signed)
Pt had cough since Thursday and becoming increasingly short of breath, pt has hx of asthma w/EMS she presented in tripod positioning and saturating at 84% on room air

## 2020-11-02 NOTE — H&P (Addendum)
Family Medicine [facility] Admission History and Physical Service Pager: [phone]  Patient name: Jill Glenn Medical record number: [id number] Date of birth: April 17, 1965 Age: 55 y.o. Gender: female  Primary Care Provider: Patient, No Pcp Per (Inactive) Consultants: None Code Status: Full Preferred Emergency Contact: Jill Glenn (spouse)  Chief Complaint: Shortness of breath  Assessment and Plan: Jill Glenn is a 55 y.o. female presenting with shortness of breath. PMH is significant for asthma, suspected COPD, tobacco use, and COVID-19 pneumonia in January 2022.  Acute Hypoxic Respiratory Failure likely 2/2 Influenza infection Patient began experiencing symptoms starting on 10/27, including non-productive cough, fever/chills, and headache. She developed shortness of breath beginning on 10/30. She had sats in the low 80's today with EMS. Shortly after arrival she was started on BiPAP x1 hr, and transitioned well to 2L [place]. She received methylpred 125 mg x1, 1 g of mag, albuterol and ipratropium. On my assessment patient has notable increased work of breathing but is able to speak in full sentences (albeit with difficulty). She is tachypneic. On auscultation she has coarse and decreased breath sounds. Found to be Flu A positive. Pertinent negatives include: an unremarkable CXR, negative D-Dimer, negative trop, and a BNP of 14 (patient does not appear volume overloaded on exam as well). Most likely etiology of AHRF currently is influenza infection.  -Admit to FPTS, attending Dr. Miquel Dunn -Start Tamiflu despite symptom onset 4 days ago given severity of presentation -Duonebs q6hrs -Consider steroids given COPD Hx -Goal O2 sat of 88-92% -Ambulatory O2 sats  Tobacco Use Disorder Smokes 1PPD for the past 40 years. Rates her motivation to quit a 10/10 to avoid future episodes of respiratory infection.  -Nicotine patch 21mg  daily -Cessation counseling  Hypokalemia K  of 3.1 on admission.  -40 meq PO potassium x1 -recheck BMP tomorrow AM  Asthma  ?COPD Only home med is Albuterol as needed, which the patient reports using regularly. Emphysematous changes/COPD noted on prior CXR and highly likely given her smoking history, although this has not been formally diagnosed (no PFTs). -Hold home medication, duonebs as above   FEN/GI: Regular diet Prophylaxis: Lovenox  Disposition: med-tele  History of Present Illness:  Jill Glenn is a 56 y.o. female presenting with shortness of breath. Reports her symptoms started 4 days ago (Thursday 10/27) with fever, chills, muscle aches, and cough. Cough is nonproductive. Also has clear rhinorrhea. She reports that she started taking Nyquil Cold & Flu which made her feel better. However, she developed shortness of breath yesterday evening while at work. She has not received a flu shot this year. Endorses slight chest heaviness, which is improved from initial presentation. Also reports a headache, which she attributes to lack of caffeine/nicotine today. She denies ABD pain, melena/hematochezia, urinary symptoms. Endorses recent weight loss, saying that she works 60-70 hours per week so she doesn't have a chance to eat as much. She states that she does not follow with a PCP. She reports home medications of albuterol prn, OTC meds (Ibuprofen, Goody powder, Nyquil/Dayquil, Mucinex)  Review Of Systems: Per HPI    Patient Active Problem List   Diagnosis Date Noted   Influenza A 11/02/2020   Acute respiratory failure with hypoxia (HCC)    Tobacco use disorder    Hypokalemia     Past Medical History: Past Medical History:  Diagnosis Date   Asthma     Past Surgical History: Past Surgical History:  Procedure Laterality Date   ABDOMINAL HYSTERECTOMY  TONSILLECTOMY      Social History: Social History   Tobacco Use   Smoking status: Every Day    Packs/day: 1.00    Types: Cigarettes  Substance Use Topics    Alcohol use: Yes    Comment: rarely    Drug use: No  3-4 alcoholic drinks/month Marijuana once in a while  Family History: Mom died of lung cancer and COPD Mom's side of the family heart disease and stroke Dad's side cancer  Allergies and Medications: Allergies  Allergen Reactions   Codeine    No current facility-administered medications on file prior to encounter.   Current Outpatient Medications on File Prior to Encounter  Medication Sig Dispense Refill   ibuprofen (ADVIL,MOTRIN) 800 MG tablet Take 1 tablet (800 mg total) by mouth 3 (three) times daily. 21 tablet 0   methylPREDNISolone (MEDROL DOSEPAK) 4 MG tablet follow package directions, take until finished, start on wed. 21 tablet 0   minocycline (MINOCIN,DYNACIN) 100 MG capsule Take 1 capsule (100 mg total) by mouth 2 (two) times daily. 20 capsule 0   predniSONE (DELTASONE) 50 MG tablet 1 tab daily for 2 days then 1/2 tab daily for 2 days. 3 tablet 0   tiotropium (SPIRIVA) 18 MCG inhalation capsule Place 1 capsule (18 mcg total) into inhaler and inhale every morning. 30 capsule 12    Objective: BP 108/75   Pulse 87   Temp 98.3 F (36.8 C) (Oral)   Resp (!) 21   Ht 5\' 5"  (1.651 m)   Wt 150 lb (68 kg)   SpO2 96%   BMI 24.96 kg/m  Physical Exam Vitals reviewed.  Cardiovascular:     Rate and Rhythm: Normal rate and regular rhythm.     Heart sounds: No murmur heard.    Comments: No JVD, no lower extremity edema Pulmonary:     Effort: Tachypnea present.     Comments: Coarse breath sounds, diminished Abdominal:     General: Bowel sounds are normal.     Palpations: Abdomen is soft.     Tenderness: There is no abdominal tenderness.  Neurological:     Mental Status: She is alert.     Labs and Imaging: CBC BMET  Recent Labs  Lab 11/02/20 0750 11/02/20 1246  WBC 8.3  --   HGB 14.2 14.6  HCT 43.4 43.0  PLT 338  --    Recent Labs  Lab 11/02/20 0750 11/02/20 1246  NA 138 141  K 3.1* 3.3*  CL 105  --    CO2 23  --   BUN 10  --   CREATININE 0.66  --   GLUCOSE 198*  --   CALCIUM 8.8*  --      EKG: sinus tachycardia, no notable ST irregularities, prolonged QT but difficult to manually discern T waves.   DG Chest Port 1 View  Result Date: 11/02/2020 CLINICAL DATA:  55 year old female with history of shortness of breath. EXAM: PORTABLE CHEST 1 VIEW COMPARISON:  Chest x-ray 05/19/2015. FINDINGS: Lung volumes are normal. No consolidative airspace disease. No pleural effusions. No pneumothorax. No pulmonary nodule or mass noted. Pulmonary vasculature and the cardiomediastinal silhouette are within normal limits. Metallic foreign body projecting over the lower cervical region appears to represent a safety pen. IMPRESSION: 1. No radiographic evidence of acute cardiopulmonary disease. 2. Safety pin projecting over the lower cervical region, presumably exterior to the patient. Correlation with physical examination is recommended to exclude the possibility of an aspirated foreign body. Electronically Signed  By: Trudie Reed M.D.   On: 11/02/2020 07:42     Carlyn Reichert, MD PGY-1 FPTS Intern pager: [phone], text pages welcome  FPTS Upper-Level Resident Addendum   I have independently interviewed and examined the patient. I have discussed the above with Dr. Jerrel Ivory and agree with the documented plan. My edits for correction/addition/clarification are included above. Please see any attending notes.   Maury Dus, MD PGY-2, [facility] 11/02/2020 4:32 PM  FPTS Service pager: [phone] (text pages welcome through [facility])

## 2020-11-02 NOTE — Progress Notes (Signed)
  FMTS Attending Admission Note: Burley Saver, MD  Personal pager:  [phone] FPTS Service Pager:  [phone]   I  have seen and examined this patient, reviewed their chart. I have discussed this patient with the resident. I agree with the resident's findings, assessment and care plan.  This Jill Glenn is a 55 year old female with a past medical history of asthma, possible COPD not on home oxygen, tobacco use who presents with 4 days of dry cough, fevers and chills, and headaches and shortness of breath that started on Saturday.  When she called EMS she was found to be satting in the low 80s and required an hour of BiPAP in the ED.  She was given 125 mg of IV Solu-Medrol and albuterol x1.  At the time my exam she is feeling better, she is on 2 L/min of nasal cannula.  On exam, she is tachypneic with sternal tugging and intercostal retractions.  Diffuse air movement in all 4 lung fields without wheezing. Heart is tachycardic without murmurs or gallops.  No JVD, no edema.    She notes she smoked 1 pack/day for 30 years.  Lab work notable for influenza A positive, D-dimer negative, troponin 2, BNP 14.  Her potassium is 3.1 on BMP.  VBG showing a decreased PCO2.  EKG showing sinus tachycardia rate 106, QTc 504, no ST segment elevation or T wave inversions.  Chest x-ray is negative for acute process.  Acute hypoxic respiratory failure secondary to influenza A-status post albuterol and IV steroids with EMS.  We will continue DuoNebs every 6 hours.  Despite that she is out of the 72-hour window, due to severity of illness we will start Tamiflu 75 mg twice daily.  Goal O2 sats 88 to 92%.  Reassess for steroids tomorrow, she is not truly meeting criteria for COPD exacerbation at this time.  Considered PE (Wells score 1.5) but D-dimer negative and no signs of edema at this time. Hypokalemia-we will replete orally. Tobacco use disorder-nicotine patch and lozenges as needed.  Resident H&P to follow.

## 2020-11-02 NOTE — Progress Notes (Signed)
RT at bedside to check on pt. RN stated she removed pt from BiPAP that pt stated it was uncomfortable. RN placed pt on 4LNC and pt tolerating well with SVS at this time, no distress noted.

## 2020-11-02 NOTE — ED Provider Notes (Signed)
[facility] EMERGENCY DEPARTMENT Provider Note   CSN: [id number] Arrival date & time: 11/02/20  0814     History Chief Complaint  Patient presents with   Shortness of Breath    Jill Glenn is a 55 y.o. female who presents emergency department with chief complaint of shortness of breath.  History is gathered by EMS.  History is limited by acuity of patient's condition.  4 days ago the patient began running a high fever, she has had persistent cough, worsening shortness of breath.  She has been using her MDI at home without relief of her symptoms.  Patient called out in respiratory distress this morning.  EMS reports that the patient's room air O2 saturation was 80%.  She was given high flow oxygen on nonrebreather and arrives with this in place.  She was given 125 of Solu-Medrol, 1 g of IV magnesium, 10 mg of albuterol and 1 mg of ipratropium prior to arrival.  Patient remains in respiratory distress with tachypnea, intermittent coughing and very labored breathing.  She denies any new onset swelling.  She complains of pain when she coughs.  She is a daily smoker but has not been able to smoke for several days due to her illness.  HPI     Past Medical History:  Diagnosis Date   Asthma     Patient Active Problem List   Diagnosis Date Noted   Influenza A 11/02/2020   Acute respiratory failure with hypoxia (HCC)    Tobacco use disorder    Hypokalemia     Past Surgical History:  Procedure Laterality Date   ABDOMINAL HYSTERECTOMY     TONSILLECTOMY       OB History   No obstetric history on file.     No family history on file.  Social History   Tobacco Use   Smoking status: Every Day    Packs/day: 1.00    Types: Cigarettes  Substance Use Topics   Alcohol use: Yes    Comment: rarely    Drug use: No    Home Medications Prior to Admission medications   Medication Sig Start Date End Date Taking? Authorizing Provider  ibuprofen (ADVIL,MOTRIN)  800 MG tablet Take 1 tablet (800 mg total) by mouth 3 (three) times daily. 07/24/13   Elpidio Anis, PA-C  methylPREDNISolone (MEDROL DOSEPAK) 4 MG tablet follow package directions, take until finished, start on wed. 10/29/13   Linna Hoff, MD  minocycline (MINOCIN,DYNACIN) 100 MG capsule Take 1 capsule (100 mg total) by mouth 2 (two) times daily. 10/29/13   Linna Hoff, MD  predniSONE (DELTASONE) 50 MG tablet 1 tab daily for 2 days then 1/2 tab daily for 2 days. 05/19/15   Linna Hoff, MD  tiotropium (SPIRIVA) 18 MCG inhalation capsule Place 1 capsule (18 mcg total) into inhaler and inhale every morning. 05/19/15   Linna Hoff, MD    Allergies    Codeine  Review of Systems   Review of Systems  Unable to perform ROS: Acuity of condition   Physical Exam Updated Vital Signs BP 115/74   Pulse (!) 102   Temp 98.3 F (36.8 C) (Oral)   Resp 19   Ht 5\' 5"  (1.651 m)   Wt 68 kg   SpO2 97%   BMI 24.96 kg/m   Physical Exam Vitals and nursing note reviewed.  Constitutional:      General: She is in acute distress.     Appearance: She is well-developed.  She is not diaphoretic.  HENT:     Head: Normocephalic and atraumatic.     Right Ear: External ear normal.     Left Ear: External ear normal.     Nose: Nose normal.     Mouth/Throat:     Mouth: Mucous membranes are moist.  Eyes:     General: No scleral icterus.    Conjunctiva/sclera: Conjunctivae normal.  Cardiovascular:     Rate and Rhythm: Normal rate and regular rhythm.     Heart sounds: Normal heart sounds. No murmur heard.   No friction rub. No gallop.  Pulmonary:     Effort: Tachypnea, accessory muscle usage and respiratory distress present.     Breath sounds: Decreased air movement present. Decreased breath sounds and wheezing present.  Abdominal:     General: Bowel sounds are normal. There is no distension.     Palpations: Abdomen is soft. There is no mass.     Tenderness: There is no abdominal tenderness. There  is no guarding.  Musculoskeletal:     Cervical back: Normal range of motion.     Right lower leg: No edema.     Left lower leg: No edema.  Skin:    General: Skin is warm and dry.  Neurological:     Mental Status: She is alert and oriented to person, place, and time.  Psychiatric:        Behavior: Behavior normal.    ED Results / Procedures / Treatments   Labs (all labs ordered are listed, but only abnormal results are displayed) Labs Reviewed  RESP PANEL BY RT-PCR (FLU A&B, COVID) ARPGX2 - Abnormal; Notable for the following components:      Result Value   Influenza A by PCR POSITIVE (*)    All other components within normal limits  COMPREHENSIVE METABOLIC PANEL - Abnormal; Notable for the following components:   Potassium 3.1 (*)    Glucose, Bld 198 (*)    Calcium 8.8 (*)    All other components within normal limits  I-STAT VENOUS BLOOD GAS, ED - Abnormal; Notable for the following components:   pCO2, Ven 41.3 (*)    pO2, Ven 62.0 (*)    Acid-base deficit 3.0 (*)    Potassium 3.3 (*)    Calcium, Ion 1.13 (*)    All other components within normal limits  CBC WITH DIFFERENTIAL/PLATELET  BRAIN NATRIURETIC PEPTIDE  D-DIMER, QUANTITATIVE  URINALYSIS, ROUTINE W REFLEX MICROSCOPIC  TROPONIN I (HIGH SENSITIVITY)  TROPONIN I (HIGH SENSITIVITY)    EKG EKG Interpretation  Date/Time:  Monday November 02 2020 09:27:13 EDT Ventricular Rate:  106 PR Interval:  138 QRS Duration: 73 QT Interval:  379 QTC Calculation: 504 R Axis:   76 Text Interpretation: Sinus tachycardia Anterolateral infarct, age indeterminate Confirmed by Virgina Norfolk ([id number]) on 11/02/2020 10:34:06 AM  Radiology DG Chest Port 1 View  Result Date: 11/02/2020 CLINICAL DATA:  55 year old female with history of shortness of breath. EXAM: PORTABLE CHEST 1 VIEW COMPARISON:  Chest x-ray 05/19/2015. FINDINGS: Lung volumes are normal. No consolidative airspace disease. No pleural effusions. No pneumothorax. No  pulmonary nodule or mass noted. Pulmonary vasculature and the cardiomediastinal silhouette are within normal limits. Metallic foreign body projecting over the lower cervical region appears to represent a safety pen. IMPRESSION: 1. No radiographic evidence of acute cardiopulmonary disease. 2. Safety pin projecting over the lower cervical region, presumably exterior to the patient. Correlation with physical examination is recommended to exclude the possibility of an aspirated  foreign body. Electronically Signed   By: Trudie Reed M.D.   On: 11/02/2020 07:42    Procedures .Critical Care E&M Performed by: Arthor Captain, PA-C  Critical care provider statement:    Critical care time (minutes):  75   Critical care time was exclusive of:  Separately billable procedures and treating other patients   Critical care was necessary to treat or prevent imminent or life-threatening deterioration of the following conditions:  Respiratory failure   Critical care was time spent personally by me on the following activities:  Development of treatment plan with patient or surrogate, discussions with consultants, evaluation of patient's response to treatment, examination of patient, interpretation of cardiac output measurements, ordering and performing treatments and interventions, ordering and review of laboratory studies, ordering and review of radiographic studies, pulse oximetry and re-evaluation of patient's condition After initial E/M assessment, critical care services were subsequently performed that were exclusive of separately billable procedures or treatment.     Medications Ordered in ED Medications  enoxaparin (LOVENOX) injection 40 mg (has no administration in time range)  nicotine (NICODERM CQ - dosed in mg/24 hours) patch 21 mg (has no administration in time range)  albuterol (PROVENTIL) (2.5 MG/3ML) 0.083% nebulizer solution (10 mg/hr Nebulization Given 11/02/20 0830)    ED Course  I have  reviewed the triage vital signs and the nursing notes.  Pertinent labs & imaging results that were available during my care of the patient were reviewed by me and considered in my medical decision making (see chart for details).  Clinical Course as of 11/02/20 1539  Mon Nov 02, 2020  0539 RN Sibyl Parr removed BIPAP due to patient intolerance with coughing  Currently maintaing 95 % on 5 L via nasal cannula [AH]    Clinical Course User Index [AH] Arthor Captain, PA-C   MDM Rules/Calculators/A&P                            7:37 AM Patient seen and evaluated at bedside with EMS. Patient will be placed on BIPAP immediately due to work off breathing. 10 mg albuterol ordered. Resp therapist notified.   JQ:[id number] distress VS: BP 115/74   Pulse (!) 102   Temp 98.3 F (36.8 C) (Oral)   Resp 19   Ht 5\' 5"  (1.651 m)   Wt 68 kg   SpO2 97%   BMI 24.96 kg/m  is gathered by patient  and EMS and patient. Previous records obtained and reviewed. DDX:The patient's complaint of sob involves an extensive number of diagnostic and treatment options, and is a complaint that carries with it a high risk of complications, morbidity, and potential mortality. Given the large differential diagnosis, medical decision making is of high complexity. The emergent differential diagnosis for shortness of breath includes, but is not limited to, Pulmonary edema, bronchoconstriction, Pneumonia, Pulmonary embolism, Pneumotherax/ Hemothorax, Dysrythmia, ACS.   Labs: I ordered reviewed and interpreted labs which include CBC shows no acute abnormalities, CMP with mild hypokalemia likely secondary to albuterol use, slightly elevated glucose of 198, BMP troponin and D-dimer all within normal limits. Imaging: I ordered and reviewed images which included . I independently visualized and interpreted all imaging. There are no acute, significant findings on today's images. EKG: EKG shows sinus tachycardia at a rate  of 106 Consults:family medicine service MDM: Patient here with acute hypoxic respiratory failure secondary to influenza, history of asthma and chronic smoking.  Patient initially placed on BiPAP but  able to be brought down to nasal cannula on 5 L. Patient disposition:The patient appears reasonably stabilized for admission considering the current resources, flow, and capabilities available in the ED at this time, and I doubt any other [facility] requiring further screening and/or treatment in the ED prior to admission.        Final Clinical Impression(s) / ED Diagnoses Final diagnoses:  Acute respiratory failure with hypoxia [facility])  Influenza A    Rx / DC Orders ED Discharge Orders     None        Arthor Captain, PA-C 11/02/20 1541    Virgina Norfolk, DO 11/02/20 1545

## 2020-11-02 NOTE — ED Notes (Signed)
Pt given sandwich and water.

## 2020-11-02 NOTE — Progress Notes (Signed)
FPTS Interim Progress Note  S:Patient was seen and evaluated at bedside on nighttime rounds with Dr. Rachael Darby. Patient reports cough b ut improvement in shortness of breath s/p DuoNebs x1. She had nausea and vomiting earlier, both of which are now resolved after Zofran IV 4mg . No chest pain, dizziness.  O: BP 113/83   Pulse 90   Temp 98.4 F (36.9 C)   Resp (!) 26   Ht 5\' 5"  (1.651 m)   Wt 68 kg   SpO2 97%   BMI 24.96 kg/m   General: Ill but non-toxic appearing, awake and alert Resp: Coughing, no increased work of breathing. On 1.5L [place] maintaining SpO2 99% CV: Regular rate per telemetry Skin: Warm, dry, well-perfused   A/P: Influenza A, acute hypoxic respiratory failure - Continue Tamiflu - Oxygen supplementation as needed, goal sats >90% - Continue other current management per day team - Duonebs q6h  Labs and orders reviewed. No changes made.   , DO 11/02/2020, 11:50 PM PGY-1, [facility] Family Medicine Service pager [phone]

## 2020-11-03 DIAGNOSIS — J9601 Acute respiratory failure with hypoxia: Secondary | ICD-10-CM | POA: Diagnosis not present

## 2020-11-03 LAB — BASIC METABOLIC PANEL
Anion gap: 8 (ref 5–15)
BUN: 10 mg/dL (ref 6–20)
CO2: 24 mmol/L (ref 22–32)
Calcium: 9.4 mg/dL (ref 8.9–10.3)
Chloride: 105 mmol/L (ref 98–111)
Creatinine, Ser: 0.71 mg/dL (ref 0.44–1.00)
GFR, Estimated: >60 mL/min (ref >60)
Glucose, Bld: 94 mg/dL (ref 70–99)
Potassium: 4.1 mmol/L (ref 3.5–5.1)
Sodium: 137 mmol/L (ref 135–145)

## 2020-11-03 LAB — URINALYSIS, ROUTINE W REFLEX MICROSCOPIC
Bilirubin Urine: NEGATIVE
Glucose, UA: NEGATIVE mg/dL
Hgb urine dipstick: NEGATIVE
Ketones, ur: NEGATIVE mg/dL
Leukocytes,Ua: NEGATIVE
Nitrite: NEGATIVE
Protein, ur: NEGATIVE mg/dL
Specific Gravity, Urine: 1.016 (ref 1.005–1.030)
pH: 6 (ref 5.0–8.0)

## 2020-11-03 LAB — HIV ANTIBODY (ROUTINE TESTING W REFLEX): HIV Screen 4th Generation wRfx: NONREACTIVE

## 2020-11-03 MED ORDER — ACETAMINOPHEN 325 MG PO TABS
650.0000 mg | ORAL_TABLET | Freq: Four times a day (QID) | ORAL | Status: DC | PRN
  Administered 2020-11-03: 650 mg via ORAL
  Filled 2020-11-03: qty 2

## 2020-11-03 MED ORDER — OSELTAMIVIR PHOSPHATE 75 MG PO CAPS: 75.0000 mg | ORAL_CAPSULE | Freq: Two times a day (BID) | ORAL | 0 refills | Status: AC

## 2020-11-03 MED ORDER — ACETAMINOPHEN 325 MG PO TABS: 650.0000 mg | ORAL_TABLET | Freq: Four times a day (QID) | ORAL | Status: AC | PRN

## 2020-11-03 MED ORDER — NICOTINE 21 MG/24HR TD PT24: 21.0000 mg | MEDICATED_PATCH | Freq: Every day | TRANSDERMAL | 0 refills | Status: DC

## 2020-11-03 MED ORDER — ALBUTEROL SULFATE HFA 108 (90 BASE) MCG/ACT IN AERS: 2.0000 | INHALATION_SPRAY | Freq: Four times a day (QID) | RESPIRATORY_TRACT | 0 refills | Status: DC | PRN

## 2020-11-03 NOTE — Progress Notes (Signed)
Discharge Note Patient given discharge instructions and verbalized understanding of medications changes.  Patient advised to obtain a PCP and she reports she intends to and verbalized the knowledge that [facility] offers a service to assist with finding a PCP.  IV d/c'd with bleeding controlled.  Spouse in room and will transport patient via POV.

## 2020-11-03 NOTE — Discharge Instructions (Addendum)
Dear Jill Glenn,   Thank you for letting us participate in your care! In this section, you will find a brief summary of why you were admitted to the hospital, what happened during your admission, your diagnosis/diagnoses, and recommended follow up.  You were admitted because you were experiencing shortness of breath.  You were diagnosed with the flu. You were treated with Tamiflu as well as inhalers and supplemental oxygen.  Your breathing improved and you were discharged from the hospital for meeting this goal.   POST-HOSPITAL & CARE INSTRUCTIONS You will need to continue your recovery over the next few days at home. Take it easy (increase activity slowly, only as tolerated by your breathing) Please see medications section of this packet for any medication changes.   DOCTOR'S APPOINTMENTS & FOLLOW UP It is very important to establish with a primary care doctor who you can see regularly. There are several health items we recommend your primary care doctor follow-up on.   Thank you for choosing [facility]! Take care and be well!  Family Medicine Teaching Service Inpatient Team [place]  [facility]  [address] [place], [place] [address] [phone]

## 2020-11-03 NOTE — Hospital Course (Addendum)
Jill Glenn is a 55 y.o. female admitted for shortness of breath and influenza A.  Acute Hypoxic Respiratory Failure 2/2 Influenza A Patient presented with shortness of breath and SpO2 in the low 80s via EMS. She received 125mg  Solumedrol and briefly required CAT and BiPap in the ED. Her breathing improved significantly and she was quickly weaned to 2L and then to room air by the following morning. Patient treated with Tamiflu (5 day total course) and Duonebs every 6 hours due to her hx of asthma and possible COPD.  Asthma  Suspected COPD Patient with hx of childhood asthma. Also suspected COPD (based on smoking hx of prior chest x-ray findings) although this has never been confirmed by PFTs. Other than the 125mg  Solumedrol, she was not treated with a course of steroids, as she did not meet criteria for COPD exacerbation (no sputum production, cough, etc). She was provided refills on her Albuterol inhaler at discharge. Recommend PCP follow-up for PFTs and possible addition of a daily controlled medication.  Tobacco Use Patient has smoked 1PPD for 30 years. She expressed motivation to quit during hospitalization. Encouraged PCP follow up for ongoing smoking cessation. Rx sent for nicotine patches at discharge.

## 2020-11-03 NOTE — ED Notes (Signed)
ED TO INPATIENT HANDOFF REPORT  ED Nurse Name and Phone #: [phone] Jill Glenn  S Name/Age/Gender Jill Glenn 55 y.o. female Room/Bed: 044C/044C  Code Status   Code Status: Full Code  Home/SNF/Other Home Patient oriented to: self, place, time, and situation Is this baseline? Yes    Triage Complete: Triage complete  Chief Complaint Influenza A [J10.1]  Triage Note Pt had cough since Thursday and becoming increasingly short of breath, pt has hx of asthma w/EMS she presented in tripod positioning and saturating at 84% on room air    Allergies Allergies  Allergen Reactions   Codeine     Level of Care/Admitting Diagnosis ED Disposition     ED Disposition  Admit   Condition  --   Comment  Hospital Area: [facility] [[id number]]  Level of Care: Telemetry Medical [104]  May place patient in observation at [facility] or [facility] if equivalent level of care is available:: No  Covid Evaluation: Confirmed COVID Negative  Diagnosis: Influenza A [614431]  Admitting Physician: Billey Co [[id number]]  Attending Physician: Billey Co [[id number]]          B Medical/Surgery History Past Medical History:  Diagnosis Date   Asthma    Past Surgical History:  Procedure Laterality Date   ABDOMINAL HYSTERECTOMY     TONSILLECTOMY       A IV Location/Drains/Wounds Patient Lines/Drains/Airways Status     Active Line/Drains/Airways     Name Placement date Placement time Site Days   Peripheral IV 11/02/20 20 G Anterior;Left;Proximal Antecubital 11/02/20  --  Antecubital  1   External Urinary Catheter 11/02/20  1609  --  1            Intake/Output Last 24 hours No intake or output data in the 24 hours ending 11/03/20 1256  Labs/Imaging Results for orders placed or performed during the hospital encounter of 11/02/20 (from the past 48 hour(s))  Resp Panel by RT-PCR (Flu A&B, Covid) Nasopharyngeal Swab     Status: Abnormal   Collection  Time: 11/02/20  7:32 AM   Specimen: Nasopharyngeal Swab; Nasopharyngeal(NP) swabs in vial transport medium  Result Value Ref Range   SARS Coronavirus 2 by RT PCR NEGATIVE NEGATIVE    Comment: (NOTE) SARS-CoV-2 target nucleic acids are NOT DETECTED.  The SARS-CoV-2 RNA is generally detectable in upper respiratory specimens during the acute phase of infection. The lowest concentration of SARS-CoV-2 viral copies this assay can detect is 138 copies/mL. A negative result does not preclude SARS-Cov-2 infection and should not be used as the sole basis for treatment or other patient management decisions. A negative result may occur with  improper specimen collection/handling, submission of specimen other than nasopharyngeal swab, presence of viral mutation(s) within the areas targeted by this assay, and inadequate number of viral copies(<138 copies/mL). A negative result must be combined with clinical observations, patient history, and epidemiological information. The expected result is Negative.  Fact Sheet for Patients:  BloggerCourse.com  Fact Sheet for Healthcare Providers:  SeriousBroker.it  This test is no t yet approved or cleared by the [place] FDA and  has been authorized for detection and/or diagnosis of SARS-CoV-2 by FDA under an Emergency Use Authorization (EUA). This EUA will remain  in effect (meaning this test can be used) for the duration of the COVID-19 declaration under Section 564(b)(1) of the Act, 21 U.S.C.section 360bbb-3(b)(1), unless the authorization is terminated  or revoked sooner.       Influenza  A by PCR POSITIVE (A) NEGATIVE   Influenza B by PCR NEGATIVE NEGATIVE    Comment: (NOTE) The Xpert Xpress SARS-CoV-2/FLU/RSV plus assay is intended as an aid in the diagnosis of influenza from Nasopharyngeal swab specimens and should not be used as a sole basis for treatment. Nasal washings and aspirates are  unacceptable for Xpert Xpress SARS-CoV-2/FLU/RSV testing.  Fact Sheet for Patients: BloggerCourse.com  Fact Sheet for Healthcare Providers: SeriousBroker.it  This test is not yet approved or cleared by the [place] FDA and has been authorized for detection and/or diagnosis of SARS-CoV-2 by FDA under an Emergency Use Authorization (EUA). This EUA will remain in effect (meaning this test can be used) for the duration of the COVID-19 declaration under Section 564(b)(1) of the Act, 21 U.S.C. section 360bbb-3(b)(1), unless the authorization is terminated or revoked.  Performed at [facility], [address], [place], [place] [address]   Comprehensive metabolic panel     Status: Abnormal   Collection Time: 11/02/20  7:50 AM  Result Value Ref Range   Sodium 138 135 - 145 mmol/L   Potassium 3.1 (L) 3.5 - 5.1 mmol/L   Chloride 105 98 - 111 mmol/L   CO2 23 22 - 32 mmol/L   Glucose, Bld 198 (H) 70 - 99 mg/dL    Comment: Glucose reference range applies only to samples taken after fasting for at least 8 hours.   BUN 10 6 - 20 mg/dL   Creatinine, Ser 3.97 0.44 - 1.00 mg/dL   Calcium 8.8 (L) 8.9 - 10.3 mg/dL   Total Protein 6.9 6.5 - 8.1 g/dL   Albumin 4.0 3.5 - 5.0 g/dL   AST 21 15 - 41 U/L   ALT 14 0 - 44 U/L   Alkaline Phosphatase 52 38 - 126 U/L   Total Bilirubin 0.4 0.3 - 1.2 mg/dL   GFR, Estimated >67 >34 mL/min    Comment: (NOTE) Calculated using the CKD-EPI Creatinine Equation (2021)    Anion gap 10 5 - 15    Comment: Performed at [facility], [address], [place], [place] [address]  CBC WITH DIFFERENTIAL     Status: None   Collection Time: 11/02/20  7:50 AM  Result Value Ref Range   WBC 8.3 4.0 - 10.5 K/uL   RBC 4.81 3.87 - 5.11 MIL/uL   Hemoglobin 14.2 12.0 - 15.0 g/dL   HCT 02.4 09.7 - 35.3 %   MCV 90.2 80.0 - 100.0 fL   MCH 29.5 26.0 - 34.0 pg   MCHC 32.7 30.0 - 36.0 g/dL   RDW 29.9 24.2 - 68.3 %    Platelets 338 150 - 400 K/uL   nRBC 0.0 0.0 - 0.2 %   Neutrophils Relative % 68 %   Neutro Abs 5.6 1.7 - 7.7 K/uL   Lymphocytes Relative 26 %   Lymphs Abs 2.2 0.7 - 4.0 K/uL   Monocytes Relative 3 %   Monocytes Absolute 0.3 0.1 - 1.0 K/uL   Eosinophils Relative 2 %   Eosinophils Absolute 0.2 0.0 - 0.5 K/uL   Basophils Relative 1 %   Basophils Absolute 0.1 0.0 - 0.1 K/uL   Immature Granulocytes 0 %   Abs Immature Granulocytes 0.02 0.00 - 0.07 K/uL    Comment: Performed at [facility], [address], [place], [place] [address]  Brain natriuretic peptide     Status: None   Collection Time: 11/02/20  7:50 AM  Result Value Ref  Range   B Natriuretic Peptide 14.0 0.0 - 100.0 pg/mL    Comment: Performed at [facility], [address], [place], [place] [address]  Troponin I (High Sensitivity)     Status: None   Collection Time: 11/02/20  9:25 AM  Result Value Ref Range   Troponin I (High Sensitivity) 2 <18 ng/L    Comment: (NOTE) Elevated high sensitivity troponin I (hsTnI) values and significant  changes across serial measurements may suggest ACS but many other  chronic and acute conditions are known to elevate hsTnI results.  Refer to the "Links" section for chest pain algorithms and additional  guidance. Performed at [facility], [address] [place], [place] [address]   D-dimer, quantitative     Status: None   Collection Time: 11/02/20  9:26 AM  Result Value Ref Range   D-Dimer, Quant <0.27 0.00 - 0.50 ug/mL-FEU    Comment: (NOTE) At the manufacturer cut-off value of 0.5 g/mL FEU, this assay has a negative predictive value of 95-100%.This assay is intended for use in conjunction with a clinical pretest probability (PTP) assessment model to exclude pulmonary embolism (PE) and deep venous thrombosis (DVT) in outpatients suspected of PE or DVT. Results should be correlated with clinical presentation. Performed at [facility], [address] [place], [place] [address]   I-Stat venous blood gas, [facility] ED)     Status: Abnormal   Collection Time: 11/02/20 12:46 PM  Result Value Ref Range   pH, Ven 7.338 7.250 - 7.430   pCO2, Ven 41.3 (L) 44.0 - 60.0 mmHg   pO2, Ven 62.0 (H) 32.0 - 45.0 mmHg   Bicarbonate 22.2 20.0 - 28.0 mmol/L   TCO2 23 22 - 32 mmol/L   O2 Saturation 90.0 %   Acid-base deficit 3.0 (H) 0.0 - 2.0 mmol/L   Sodium 141 135 - 145 mmol/L   Potassium 3.3 (L) 3.5 - 5.1 mmol/L   Calcium, Ion 1.13 (L) 1.15 - 1.40 mmol/L   HCT 43.0 36.0 - 46.0 %   Hemoglobin 14.6 12.0 - 15.0 g/dL   Sample type VENOUS   Urinalysis, Routine w reflex microscopic Urine, Clean Catch     Status: None   Collection Time: 11/03/20  5:00 AM  Result Value Ref Range   Color, Urine YELLOW YELLOW   APPearance CLEAR CLEAR   Specific Gravity, Urine 1.016 1.005 - 1.030   pH 6.0 5.0 - 8.0   Glucose, UA NEGATIVE NEGATIVE mg/dL   Hgb urine dipstick NEGATIVE NEGATIVE   Bilirubin Urine NEGATIVE NEGATIVE   Ketones, ur NEGATIVE NEGATIVE mg/dL   Protein, ur NEGATIVE NEGATIVE mg/dL   Nitrite NEGATIVE NEGATIVE   Leukocytes,Ua NEGATIVE NEGATIVE    Comment: Performed at [facility], [address], [place], [place] [address]  Basic metabolic panel     Status: None   Collection Time: 11/03/20  5:17 AM  Result Value Ref Range   Sodium 137 135 - 145 mmol/L   Potassium 4.1 3.5 - 5.1 mmol/L    Comment: DELTA CHECK NOTED   Chloride 105 98 - 111 mmol/L   CO2 24 22 - 32 mmol/L   Glucose, Bld 94 70 - 99 mg/dL    Comment: Glucose reference range applies only to samples taken after fasting for at least 8 hours.   BUN 10 6 - 20 mg/dL   Creatinine, Ser 2.70 0.44 - 1.00 mg/dL   Calcium 9.4 8.9 - 35.0 mg/dL  GFR, Estimated >60 >60 mL/min    Comment: (NOTE) Calculated using the CKD-EPI Creatinine Equation (2021)    Anion gap 8 5 - 15    Comment: Performed at [facility], [address], [place], [place] [address]   DG Chest Port 1 View  Result  Date: 11/02/2020 CLINICAL DATA:  55 year old female with history of shortness of breath. EXAM: PORTABLE CHEST 1 VIEW COMPARISON:  Chest x-ray 05/19/2015. FINDINGS: Lung volumes are normal. No consolidative airspace disease. No pleural effusions. No pneumothorax. No pulmonary nodule or mass noted. Pulmonary vasculature and the cardiomediastinal silhouette are within normal limits. Metallic foreign body projecting over the lower cervical region appears to represent a safety pen. IMPRESSION: 1. No radiographic evidence of acute cardiopulmonary disease. 2. Safety pin projecting over the lower cervical region, presumably exterior to the patient. Correlation with physical examination is recommended to exclude the possibility of an aspirated foreign body. Electronically Signed   By: Trudie Reed M.D.   On: 11/02/2020 07:42    Pending Labs Unresulted Labs (From admission, onward)     Start     Ordered   11/03/20 0500  HIV Antibody (routine testing w rflx)  (HIV Antibody (Routine testing w reflex) panel)  Tomorrow morning,   R        11/02/20 1518            Vitals/Pain Today's Vitals   11/03/20 1115 11/03/20 1205 11/03/20 1230 11/03/20 1245  BP:  91/63    Pulse: 80 73 79 82  Resp: 19 (!) 22 20 (!) 22  Temp:      TempSrc:      SpO2: 95% 98% 94% 95%  Weight:      Height:      PainSc:        Isolation Precautions No active isolations  Medications Medications  enoxaparin (LOVENOX) injection 40 mg (40 mg Subcutaneous Given 11/02/20 1553)  nicotine (NICODERM CQ - dosed in mg/24 hours) patch 21 mg (21 mg Transdermal Patch Applied 11/03/20 1111)  ipratropium-albuterol (DUONEB) 0.5-2.5 (3) MG/3ML nebulizer solution 3 mL (3 mLs Nebulization Given 11/03/20 1034)  oseltamivir (TAMIFLU) capsule 75 mg (75 mg Oral Given 11/03/20 1110)  acetaminophen (TYLENOL) tablet 650 mg (650 mg Oral Given 11/03/20 0116)  albuterol (PROVENTIL) (2.5 MG/3ML) 0.083% nebulizer solution (10 mg/hr Nebulization Given  11/02/20 0830)  potassium chloride SA (KLOR-CON) CR tablet 40 mEq (40 mEq Oral Given 11/02/20 1715)  ondansetron (ZOFRAN-ODT) disintegrating tablet 4 mg (4 mg Oral Given 11/02/20 2002)  ondansetron (ZOFRAN) injection 4 mg (4 mg Intravenous Given 11/02/20 2126)    Mobility walks Low fall risk   Focused Assessments   R Recommendations: See Admitting Provider Note  Report given to:   Additional Notes:

## 2020-11-03 NOTE — Evaluation (Signed)
Physical Therapy Evaluation Patient Details Name: Jill Glenn MRN: [id number] DOB: 1965-05-22 Today's Date: 11/03/2020  History of Present Illness  Jill Glenn is a 55 y.o. female presenting with shortness of breath. PMH is significant for asthma, suspected COPD, tobacco use, and COVID-19 pneumonia in January 2022.  Positive for Influenza A.  Clinical Impression  Patient presents with decreased mobility due to decreased strength, decreased activity tolerance,  decreased balanec and today needed min A with hand hold A to ambulate in hallway demonstrating wide BOS and staggering at times due to LOB.  She was SOB, but SpO2 stayed 89% or higher on RA.  She normally works at Production designer, theatre/television/film at [employer].  Patient will benefit from skilled PT in the acute setting prior to d/c home with spouse assist and follow up HHPT.     Recommendations for follow up therapy are one component of a multi-disciplinary discharge planning process, led by the attending physician.  Recommendations may be updated based on patient status, additional functional criteria and insurance authorization.  Follow Up Recommendations Home health PT    Assistance Recommended at Discharge Frequent or constant Supervision/Assistance  Functional Status Assessment Patient has had a recent decline in their functional status and demonstrates the ability to make significant improvements in function in a reasonable and predictable amount of time.  Equipment Recommendations  Rolling walker (2 wheels)    Recommendations for Other Services       Precautions / Restrictions Precautions Precautions: Fall      Mobility  Bed Mobility Overal bed mobility: Needs Assistance Bed Mobility: Supine to Sit;Sit to Supine     Supine to sit: Supervision;HOB elevated Sit to supine: Min guard   General bed mobility comments: assist for lines, for positioning once supine    Transfers Overall transfer level: Needs assistance   Transfers:  Sit to/from Stand Sit to Stand: Min guard           General transfer comment: assist for balance    Ambulation/Gait Ambulation/Gait assistance: Min assist Gait Distance (Feet): 120 Feet Assistive device: 1 person hand held assist Gait Pattern/deviations: Step-through pattern;Decreased stride length;Staggering left;Staggering right;Wide base of support     General Gait Details: reports heavy legs, noted wide BOS and LOB with min A needed for safety  Stairs            Wheelchair Mobility    Modified Rankin (Stroke Patients Only)       Balance Overall balance assessment: Needs assistance   Sitting balance-Leahy Scale: Good     Standing balance support: Single extremity supported Standing balance-Leahy Scale: Poor Standing balance comment: needs support for balance                             Pertinent Vitals/Pain Pain Assessment: No/denies pain    Home Living Family/patient expects to be discharged to:: Private residence Living Arrangements: Spouse/significant other Available Help at Discharge: Family Type of Home: House Home Access: Stairs to enter Entrance Stairs-Rails: Doctor, general practice of Steps: 3   Home Layout: One level Home Equipment: Cane - single point      Prior Function Prior Level of Function : Independent/Modified Independent             Mobility Comments: works as Production designer, theatre/television/film at [employer]        Extremity/Trunk Assessment   Upper Extremity Assessment Upper Extremity Assessment: Overall WFL for tasks  assessed    Lower Extremity Assessment Lower Extremity Assessment: Generalized weakness (esp weak with hip flexion)       Communication   Communication: No difficulties  Cognition Arousal/Alertness: Awake/alert Behavior During Therapy: WFL for tasks assessed/performed Overall Cognitive Status: Within Functional Limits for tasks assessed                                           General Comments General comments (skin integrity, edema, etc.): on RA SpO2 89% at lowest, moderate dyspnea noted    Exercises     Assessment/Plan    PT Assessment Patient needs continued PT services  PT Problem List Decreased strength;Decreased mobility;Decreased activity tolerance;Decreased knowledge of use of DME;Decreased balance       PT Treatment Interventions DME instruction;Therapeutic activities;Patient/family education;Therapeutic exercise;Gait training;Stair training;Functional mobility training;Balance training    PT Goals (Current goals can be found in the Care Plan section)  Acute Rehab PT Goals Patient Stated Goal: to feel better, go home PT Goal Formulation: With patient Time For Goal Achievement: 11/17/20 Potential to Achieve Goals: Good    Frequency Min 3X/week   Barriers to discharge        Co-evaluation               AM-PAC PT "6 Clicks" Mobility  Outcome Measure Help needed turning from your back to your side while in a flat bed without using bedrails?: None Help needed moving from lying on your back to sitting on the side of a flat bed without using bedrails?: None Help needed moving to and from a bed to a chair (including a wheelchair)?: A Little Help needed standing up from a chair using your arms (e.g., wheelchair or bedside chair)?: A Little Help needed to walk in hospital room?: A Little Help needed climbing 3-5 steps with a railing? : A Lot 6 Click Score: 19    End of Session   Activity Tolerance: Patient limited by fatigue Patient left: in bed;with call bell/phone within reach   PT Visit Diagnosis: Other abnormalities of gait and mobility (R26.89);Muscle weakness (generalized) (M62.81)    Time: 1320-1340 PT Time Calculation (min) (ACUTE ONLY): 20 min   Charges:   PT Evaluation $PT Eval Moderate Complexity: 1 Mod          Cyndi Joell Buerger, PT Acute Rehabilitation  Services Pager:[phone] Office:[phone] 11/03/2020   Elray Mcgregor 11/03/2020, 4:52 PM

## 2020-11-03 NOTE — Progress Notes (Signed)
Family Medicine Teaching Service Daily Progress Note Intern Pager: [phone]  Patient name: Jill Glenn Medical record number: [id number] Date of birth: 1965-08-24 Age: 55 y.o. Gender: female  Primary Care Provider: Patient, No Pcp Per (Inactive) Consultants: None Code Status: FULL  Pt Overview and Major Events to Date:  10/31 admitted for SOB  Assessment and Plan:  Jill Glenn is a 55 year old female admitted for SOB found to be positive for Flu A with PMH significant for asthma, possible COPD, and tobacco use.   Acute Hypoxic Respiratory Failure 2/2 to Influenza A Patient's breathing today is improved per patient. Overnight she had some nausea after her breathing treatment which has resolved with zofran yesterday. Says her headache improved last night with tylenol. CTAB on lung exam today without retractions. Cough present with clear sputum. Feels she is improving overall with the cough being her main symptom. Was on 2L O2 [place] sats 99-100%, turned oxygen off in room and continued saturating high. Has remained afebrile. -ambulatory o2 sats today -acetaminophen 650 mg q6h prn -duoneb 3 mL q6h -tamiflu 75 mg BID day 2/5 -O2 therapy prn keep sats 88-92%  Asthma/Possible COPD Home med of albuterol prn. No formal PFTs, COPD exacerbation less likely given clear sputum, cough present, but dyspnea improving. -management as in above problem -consider maintenance inhaler at discharge -consider PFTs outpatient  Hypokalemia, resolved K today is 4.1 from 3.3 yesterday. -potassium repletion as needed -Daily BMP  Tobacco Use Disorder Stable, had smoked 1 ppd for last 40 years. -Nicotine patch 21 mg daily  -Encourage cessation  FEN/GI: Regular Diet PPx: lovenox 40 mg q24h Dispo:Home pending ambulatory oxygen saturation/respiratory status today  Subjective:  Says she feels better today than before.  Objective: Temp:  [98.4 F (36.9 C)-98.5 F (36.9 C)] 98.5 F (36.9 C)  (11/01 0014) Pulse Rate:  [63-132] 63 (11/01 0800) Resp:  [16-31] 19 (11/01 0800) BP: (97-129)/(66-86) 97/83 (11/01 0800) SpO2:  [93 %-100 %] 95 % (11/01 0800) Physical Exam: General: Non-toxic, tired Cardiovascular: RRR no m/r/g Respiratory: Speaking full sentences. CTAB no w/r/c no retractions, productive sounding cough present Abdomen: Non distended non tender to palpation Extremities: No LE edema  Laboratory: Recent Labs  Lab 11/02/20 0750 11/02/20 1246  WBC 8.3  --   HGB 14.2 14.6  HCT 43.4 43.0  PLT 338  --    Recent Labs  Lab 11/02/20 0750 11/02/20 1246 11/03/20 0517  NA 138 141 137  K 3.1* 3.3* 4.1  CL 105  --  105  CO2 23  --  24  BUN 10  --  10  CREATININE 0.66  --  0.71  CALCIUM 8.8*  --  9.4  PROT 6.9  --   --   BILITOT 0.4  --   --   ALKPHOS 52  --   --   ALT 14  --   --   AST 21  --   --   GLUCOSE 198*  --  94    Imaging/Diagnostic Tests:   Levin Erp, MD 11/03/2020, 11:49 AM PGY-1, [facility] FPTS Intern pager: [phone], text pages welcome

## 2020-11-03 NOTE — Progress Notes (Signed)
SATURATION QUALIFICATIONS: (This note is used to comply with regulatory documentation for home oxygen)  Patient Saturations on Room Air at Rest = 95%  Patient Saturations on Room Air while Ambulating = 88%  Patient Saturations on 0  Liters of oxygen while Ambulating = na%  Please briefly explain why patient needs home oxygen:  Patient on room air while resting with saturation of 95%.  With ambulation saturation dropped to 88% and upon returning to resting saturation returned to 95% quickly.

## 2020-11-06 NOTE — Discharge Summary (Signed)
Family Medicine Teaching [facility] Discharge Summary  Patient name: Jill Glenn Medical record number: [id number] Date of birth: [birth year] Age: 55 y.o. Gender: female Date of Admission: 11/02/2020  Date of Discharge: 11/03/2020 Admitting Physician: Billey Co, MD  Primary Care Provider: Patient, No Pcp Per (Inactive) Consultants: None  Indication for Hospitalization: Acute Hypoxic Respiratory Failure, Influenza A  Discharge Diagnoses/Problem List:  Acute Hypoxic Respiratory Failure Influenza A Asthma Suspected COPD Hypokalemia Tobacco Use Disorder  Disposition: Home  Discharge Condition: Improved, stable  Discharge Exam:  Performed by Dr. Laroy Apple on day of discharge: General: Non-toxic, tired Cardiovascular: RRR no m/r/g Respiratory: Speaking full sentences. CTAB no w/r/c no retractions, productive sounding cough present Abdomen: Non distended non tender to palpation Extremities: No LE edema  Brief Hospital Course:  Jill Glenn is a 55 y.o. female admitted for shortness of breath and influenza A.  Acute Hypoxic Respiratory Failure 2/2 Influenza A Patient presented with shortness of breath and SpO2 in the low 80s via EMS. She received 125mg  Solumedrol and briefly required CAT and BiPap in the ED. Her breathing improved significantly and she was quickly weaned to 2L and then to room air by the following morning. Patient treated with Tamiflu (5 day total course) and Duonebs every 6 hours due to her hx of asthma and possible COPD.  Asthma  Suspected COPD Patient with hx of childhood asthma. Also suspected COPD (based on smoking hx of prior chest x-ray findings) although this has never been confirmed by PFTs. Other than the 125mg  Solumedrol, she was not treated with a course of steroids, as she did not meet criteria for COPD exacerbation (no sputum production, cough, etc). She was provided refills on her Albuterol inhaler at discharge. Recommend PCP  follow-up for PFTs and possible addition of a daily controlled medication.  Tobacco Use Patient has smoked 1PPD for 30 years. She expressed motivation to quit during hospitalization. Encouraged PCP follow up for ongoing smoking cessation. Rx sent for nicotine patches at discharge.  Hypokalemia Patient with mild hypokalemia to 3.1. She was given PO repletion and this resolved.   Issues for Follow Up:  Recommend PFTs as an outpatient She may benefit from daily controller medication for asthma vs COPD Ongoing smoking cessation counseling  Significant Procedures: None  Significant Labs and Imaging:  Recent Labs  Lab 11/02/20 0750 11/02/20 1246  WBC 8.3  --   HGB 14.2 14.6  HCT 43.4 43.0  PLT 338  --    Recent Labs  Lab 11/02/20 0750 11/02/20 1246 11/03/20 0517  NA 138 141 137  K 3.1* 3.3* 4.1  CL 105  --  105  CO2 23  --  24  GLUCOSE 198*  --  94  BUN 10  --  10  CREATININE 0.66  --  0.71  CALCIUM 8.8*  --  9.4  ALKPHOS 52  --   --   AST 21  --   --   ALT 14  --   --   ALBUMIN 4.0  --   --       Results/Tests Pending at Time of Discharge: None  Discharge Medications:  Allergies as of 11/03/2020       Reactions   Codeine         Medication List     STOP taking these medications    ibuprofen 800 MG tablet Commonly known as: ADVIL   methylPREDNISolone 4 MG tablet Commonly known as: MEDROL DOSEPAK   minocycline 100 MG  capsule Commonly known as: MINOCIN   NYQUIL PO   predniSONE 50 MG tablet Commonly known as: DELTASONE   tiotropium 18 MCG inhalation capsule Commonly known as: SPIRIVA       TAKE these medications    acetaminophen 325 MG tablet Commonly known as: TYLENOL Take 2 tablets (650 mg total) by mouth every 6 (six) hours as needed for moderate pain, mild pain or headache.   albuterol 108 (90 Base) MCG/ACT inhaler Commonly known as: VENTOLIN HFA Inhale 2 puffs into the lungs every 6 (six) hours as needed for wheezing or shortness  of breath.   Cranberry 500 MG Tabs Take 1 tablet by mouth daily as needed (uti prevention).   guaiFENesin 600 MG 12 hr tablet Commonly known as: MUCINEX Take 600 mg by mouth 2 (two) times daily as needed for cough.   Multivitamin Adult Tabs Take 1 tablet by mouth daily.   nicotine 21 mg/24hr patch Commonly known as: NICODERM CQ - dosed in mg/24 hours Place 1 patch (21 mg total) onto the skin daily.   oseltamivir 75 MG capsule Commonly known as: TAMIFLU Take 1 capsule (75 mg total) by mouth 2 (two) times daily for 3 days.   vitamin C 500 MG tablet Commonly known as: ASCORBIC ACID Take 500 mg by mouth daily.        Discharge Instructions: Please refer to Patient Instructions section of EMR for full details.  Patient was counseled important signs and symptoms that should prompt return to medical care, changes in medications, dietary instructions, activity restrictions, and follow up appointments.   Follow-Up Appointments: Patient advised to follow up with a primary care physician. Future Appointments  Date Time Provider Department Center  12/21/2020  8:20 AM Etta Grandchild, MD [facility] None     Maury Dus, MD 11/06/2020, 3:51 PM PGY-2, [facility]

## 2020-12-21 ENCOUNTER — Ambulatory Visit: Payer: BC Managed Care – PPO | Admitting: Internal Medicine

## 2020-12-21 ENCOUNTER — Ambulatory Visit (INDEPENDENT_AMBULATORY_CARE_PROVIDER_SITE_OTHER): Payer: BC Managed Care – PPO

## 2020-12-21 ENCOUNTER — Other Ambulatory Visit: Payer: Self-pay

## 2020-12-21 ENCOUNTER — Encounter: Payer: Self-pay | Admitting: Internal Medicine

## 2020-12-21 VITALS — BP 114/78 | HR 61 | Temp 97.6°F | Resp 20 | Ht 65.0 in | Wt 129.0 lb

## 2020-12-21 DIAGNOSIS — Z1211 Encounter for screening for malignant neoplasm of colon: Secondary | ICD-10-CM | POA: Insufficient documentation

## 2020-12-21 DIAGNOSIS — J431 Panlobular emphysema: Secondary | ICD-10-CM

## 2020-12-21 DIAGNOSIS — J418 Mixed simple and mucopurulent chronic bronchitis: Secondary | ICD-10-CM | POA: Diagnosis not present

## 2020-12-21 DIAGNOSIS — Z1231 Encounter for screening mammogram for malignant neoplasm of breast: Secondary | ICD-10-CM

## 2020-12-21 DIAGNOSIS — R053 Chronic cough: Secondary | ICD-10-CM

## 2020-12-21 DIAGNOSIS — Z0001 Encounter for general adult medical examination with abnormal findings: Secondary | ICD-10-CM | POA: Diagnosis not present

## 2020-12-21 DIAGNOSIS — F17200 Nicotine dependence, unspecified, uncomplicated: Secondary | ICD-10-CM

## 2020-12-21 DIAGNOSIS — Z23 Encounter for immunization: Secondary | ICD-10-CM | POA: Diagnosis not present

## 2020-12-21 DIAGNOSIS — Z1159 Encounter for screening for other viral diseases: Secondary | ICD-10-CM

## 2020-12-21 LAB — LIPID PANEL
Cholesterol: 177 mg/dL (ref 0–200)
HDL: 74.1 mg/dL (ref >39.00)
LDL Cholesterol: 91 mg/dL (ref 0–99)
NonHDL: 102.58
Total CHOL/HDL Ratio: 2
Triglycerides: 59 mg/dL (ref 0.0–149.0)
VLDL: 11.8 mg/dL (ref 0.0–40.0)

## 2020-12-21 LAB — HEPATITIS C ANTIBODY
Hepatitis C Ab: NONREACTIVE
SIGNAL TO CUT-OFF: <0.02 (ref <1.00)

## 2020-12-21 MED ORDER — TRELEGY ELLIPTA 100-62.5-25 MCG/ACT IN AEPB: 1.0000 | INHALATION_SPRAY | Freq: Every day | RESPIRATORY_TRACT | 1 refills | Status: DC

## 2020-12-21 NOTE — Patient Instructions (Signed)

## 2020-12-21 NOTE — Progress Notes (Signed)
Subjective:  Patient ID: Jill Glenn, female    DOB: August 18, 1965  Age: 55 y.o. MRN: [id number]  CC: Annual Exam, Cough, and COPD  This visit occurred during the SARS-CoV-2 public health emergency.  Safety protocols were in place, including screening questions prior to the visit, additional usage of staff PPE, and extensive cleaning of exam room while observing appropriate contact time as indicated for disinfecting solutions.    HPI Jill Glenn presents for a CPX and f/up -  She has a chronic cough and has had 2 exacerbations of COPD and pneumonia over the last year.  She continues to have a cough that is nonproductive.  She has shortness of breath and wheezing.  History Jill Glenn has a past medical history of Asthma.   [name]   Her family history includes COPD in her mother; Lung cancer in her mother.She reports that she has been smoking cigarettes. She has a 40.00 pack-year smoking history. She has never used smokeless tobacco. She reports that she does not currently use alcohol. She reports that she does not use drugs.  Outpatient Medications Prior to Visit  Medication Sig Dispense Refill   acetaminophen (TYLENOL) 325 MG tablet Take 2 tablets (650 mg total) by mouth every 6 (six) hours as needed for moderate pain, mild pain or headache.     albuterol (VENTOLIN HFA) 108 (90 Base) MCG/ACT inhaler Inhale 2 puffs into the lungs every 6 (six) hours as needed for wheezing or shortness of breath. 18 g 0   Cranberry 500 MG TABS Take 1 tablet by mouth daily as needed (uti prevention).     guaiFENesin (MUCINEX) 600 MG 12 hr tablet Take 600 mg by mouth 2 (two) times daily as needed for cough.     Multiple Vitamin (MULTIVITAMIN ADULT) TABS Take 1 tablet by mouth daily.     nicotine (NICODERM CQ - DOSED IN MG/24 HOURS) 21 mg/24hr patch Place 1 patch (21 mg total) onto the skin daily. 28 patch 0   vitamin C  (ASCORBIC ACID) 500 MG tablet Take 500 mg by mouth daily.     No facility-administered medications prior to visit.    ROS Review of Systems  Constitutional:  Negative for chills, diaphoresis, fatigue and fever.  HENT: Negative.    Eyes: Negative.   Respiratory:  Positive for cough, shortness of breath and wheezing. Negative for choking, chest tightness and stridor.   Cardiovascular:  Negative for chest pain, palpitations and leg swelling.  Gastrointestinal:  Negative for abdominal pain, constipation, diarrhea and vomiting.  Endocrine: Negative.   Genitourinary: Negative.  Negative for difficulty urinating.  Musculoskeletal: Negative.   Skin: Negative.   Neurological: Negative.  Negative for dizziness, weakness, light-headedness, numbness and headaches.  Hematological:  Negative for adenopathy. Does not bruise/bleed easily.  Psychiatric/Behavioral: Negative.     Objective:  BP 114/78 (BP Location: Right Arm, Patient Position: Sitting, Cuff Size: Large)    Pulse 61    Temp 97.6 F (36.4 C) (Oral)    Resp 20    Ht 5\' 5"  (1.651 m)    Wt 129 lb (58.5 kg)    SpO2 97%    BMI 21.47 kg/m   Physical Exam Vitals reviewed.  Constitutional:      Appearance: Normal appearance. She is not ill-appearing.  HENT:     Nose: Nose normal.     Mouth/Throat:     Mouth: Mucous membranes are moist.  Pharynx: No oropharyngeal exudate or posterior oropharyngeal erythema.  Eyes:     General: No scleral icterus.    Conjunctiva/sclera: Conjunctivae normal.  Cardiovascular:     Rate and Rhythm: Normal rate and regular rhythm.     Heart sounds: No murmur heard. Pulmonary:     Effort: Accessory muscle usage present. No tachypnea or respiratory distress.     Breath sounds: Examination of the right-upper field reveals decreased breath sounds and rhonchi. Examination of the left-upper field reveals decreased breath sounds and rhonchi. Examination of the right-middle field reveals decreased breath sounds.  Examination of the left-middle field reveals decreased breath sounds. Examination of the right-lower field reveals decreased breath sounds. Examination of the left-lower field reveals decreased breath sounds. Decreased breath sounds and rhonchi present. No wheezing or rales.  Abdominal:     General: Abdomen is flat.     Palpations: There is no mass.     Tenderness: There is no abdominal tenderness. There is no guarding.     Hernia: No hernia is present.  Musculoskeletal:        General: Normal range of motion.     Cervical back: Neck supple.  Lymphadenopathy:     Cervical: No cervical adenopathy.  Skin:    General: Skin is warm and dry.     Findings: No rash.  Neurological:     General: No focal deficit present.     Mental Status: She is alert. Mental status is at baseline.  Psychiatric:        Mood and Affect: Mood normal.        Behavior: Behavior normal.    Lab Results  Component Value Date   WBC 8.3 11/02/2020   HGB 14.6 11/02/2020   HCT 43.0 11/02/2020   PLT 338 11/02/2020   GLUCOSE 94 11/03/2020   CHOL 177 12/21/2020   TRIG 59.0 12/21/2020   HDL 74.10 12/21/2020   LDLCALC 91 12/21/2020   ALT 14 11/02/2020   AST 21 11/02/2020   NA 137 11/03/2020   K 4.1 11/03/2020   CL 105 11/03/2020   CREATININE 0.71 11/03/2020   BUN 10 11/03/2020   CO2 24 11/03/2020    FINDINGS: The lungs are hyperinflated, increased from prior exam. Increased bronchial thickening. No focal airspace disease, pleural effusion, pulmonary edema or pneumothorax. No visualized pulmonary nodule or mass. The heart is normal in size with stable mediastinal contours. No acute osseous abnormalities are seen.   IMPRESSION: Increased hyperinflation and bronchial thickening, suggesting acute bronchitis. No evidence of pneumonia.     Electronically Signed   By: Narda Rutherford M.D.   On: 12/21/2020 10:52  Assessment & Plan:   Jill Glenn was seen today for annual exam, cough and copd.  Diagnoses and  all orders for this visit:  Mixed simple and mucopurulent chronic bronchitis (HCC)- I recommended that she stop smoking cigarettes and start using inhaled ICS/LAMA/LABA. -     TRELEGY ELLIPTA 100-62.5-25 MCG/ACT AEPB; Inhale 1 puff into the lungs daily.  Panlobular emphysema (HCC) -     TRELEGY ELLIPTA 100-62.5-25 MCG/ACT AEPB; Inhale 1 puff into the lungs daily.  Chronic cough- Her chest x-ray is negative for mass or infiltrate. -     DG Chest 2 View; Future  Need for hepatitis C screening test -     Hepatitis C antibody; Future -     Hepatitis C antibody  Encounter for general adult medical examination with abnormal findings- Exam completed, labs reviewed, vaccines reviewed and updated, cancer  screenings addressed, patient education was given. -     Lipid panel; Future -     Hepatitis C antibody; Future -     Hepatitis C antibody -     Lipid panel  Need for vaccination -     Pneumococcal conjugate vaccine 20-valent (Prevnar 20)  Screen for colon cancer -     Cologuard  Visit for screening mammogram -     MM DIGITAL SCREENING BILATERAL; Future  Tobacco use disorder -     Ambulatory Referral for Lung Cancer Scre  Other orders -     Flu Vaccine QUAD 6+ mos PF IM (Fluarix Quad PF)   I am having Jill Glenn start on Trelegy Ellipta. I am also having her maintain her guaiFENesin, vitamin C, Multivitamin Adult, Cranberry, albuterol, acetaminophen, and nicotine.  Meds ordered this encounter  Medications   TRELEGY ELLIPTA 100-62.5-25 MCG/ACT AEPB    Sig: Inhale 1 puff into the lungs daily.    Dispense:  120 each    Refill:  1     Follow-up: Return in about 3 months (around 03/21/2021).  Sanda Linger, MD

## 2021-01-18 LAB — COLOGUARD: COLOGUARD: NEGATIVE

## 2021-02-04 ENCOUNTER — Ambulatory Visit
Admission: RE | Admit: 2021-02-04 | Discharge: 2021-02-04 | Disposition: A | Discharge status: Home / Self Care | Payer: BC Managed Care – PPO | Source: Ambulatory Visit | Attending: Internal Medicine | Admitting: Internal Medicine

## 2021-02-04 DIAGNOSIS — Z1231 Encounter for screening mammogram for malignant neoplasm of breast: Secondary | ICD-10-CM

## 2021-02-10 ENCOUNTER — Telehealth: Payer: Self-pay | Admitting: Internal Medicine

## 2021-02-10 NOTE — Telephone Encounter (Signed)
Connected to [facility] 2.7.2023.  Caller states she started Trelegy on 12/19- states she had a HA this AM, and a busted blood vessel in her eye. Caller states she also has bilateral leg cramping.  Advised to call EMS 911 .

## 2021-02-23 ENCOUNTER — Other Ambulatory Visit: Payer: Self-pay | Admitting: *Deleted

## 2021-02-23 DIAGNOSIS — Z87891 Personal history of nicotine dependence: Secondary | ICD-10-CM

## 2021-02-23 DIAGNOSIS — F17210 Nicotine dependence, cigarettes, uncomplicated: Secondary | ICD-10-CM

## 2021-03-05 ENCOUNTER — Ambulatory Visit: Payer: BC Managed Care – PPO

## 2021-03-09 ENCOUNTER — Ambulatory Visit (INDEPENDENT_AMBULATORY_CARE_PROVIDER_SITE_OTHER): Payer: BC Managed Care – PPO | Admitting: Acute Care

## 2021-03-09 ENCOUNTER — Other Ambulatory Visit: Payer: Self-pay

## 2021-03-09 ENCOUNTER — Encounter: Payer: Self-pay | Admitting: Acute Care

## 2021-03-09 DIAGNOSIS — F17210 Nicotine dependence, cigarettes, uncomplicated: Secondary | ICD-10-CM | POA: Diagnosis not present

## 2021-03-09 NOTE — Progress Notes (Signed)
Virtual Visit via Telephone Note ? ?I connected with Jill Glenn on 03/09/21 at  9:30 AM EST by telephone and verified that I am speaking with the correct person using two identifiers. ? ?Location: ?Patient: At home ?Provider:  [address], [place], [place], [address]  ?  ?I discussed the limitations, risks, security and privacy concerns of performing an evaluation and management service by telephone and the availability of in person appointments. I also discussed with the patient that there may be a patient responsible charge related to this service. The patient expressed understanding and agreed to proceed. ? ? ?Shared Decision Making Visit Lung Cancer Screening Program ?([phone]) ? ? ?Eligibility: ?Age 56 y.o. ?Pack Years Smoking History Calculation 60 pack year smoking history ?(# packs/per year x # years smoked) ?Recent History of coughing up blood  no ?Unexplained weight loss? no ?( >Than 15 pounds within the last 6 months ) ?Prior History Lung / other cancer no ?(Diagnosis within the last 5 years already requiring surveillance chest CT Scans). ?Smoking Status Current Smoker ?Former Smokers: Years since quit:  NA ? Quit Date:  NA ? ?Visit Components: ?Discussion included one or more decision making aids. yes ?Discussion included risk/benefits of screening. yes ?Discussion included potential follow up diagnostic testing for abnormal scans. yes ?Discussion included meaning and risk of over diagnosis. yes ?Discussion included meaning and risk of False Positives. yes ?Discussion included meaning of total radiation exposure. yes ? ?Counseling Included: ?Importance of adherence to annual lung cancer LDCT screening. yes ?Impact of comorbidities on ability to participate in the program. yes ?Ability and willingness to under diagnostic treatment. yes ? ?Smoking Cessation Counseling: ?Current Smokers:  ?Discussed importance of smoking cessation. yes ?Information about tobacco cessation classes and  interventions provided to patient. yes ?Patient provided with "ticket" for LDCT Scan. yes ?Symptomatic Patient. no ? Counseling NA ?Diagnosis Code: Tobacco Use Z72.0 ?Asymptomatic Patient yes ? Counseling (Intermediate counseling: > three minutes counseling) J6967 ?Former Smokers:  ?Discussed the importance of maintaining cigarette abstinence. yes ?Diagnosis Code: Personal History of Nicotine Dependence. E93.810 ?Information about tobacco cessation classes and interventions provided to patient. Yes ?Patient provided with "ticket" for LDCT Scan. yes ?Written Order for Lung Cancer Screening with LDCT placed in Epic. Yes ?(CT Chest Lung Cancer Screening Low Dose W/O CM) [id number] ?Z12.2-Screening of respiratory organs ?Z87.891-Personal history of nicotine dependence ? ?I have spent 25 minutes of face to face/ virtual visit   time with  Jill Glenn discussing the risks and benefits of lung cancer screening. We viewed / discussed a power point together that explained in detail the above noted topics. We paused at intervals to allow for questions to be asked and answered to ensure understanding.We discussed that the single most powerful action that she can take to decrease her risk of developing lung cancer is to quit smoking. We discussed whether or not she is ready to commit to setting a quit date. We discussed options for tools to aid in quitting smoking including nicotine replacement therapy, non-nicotine medications, support groups, Quit Smart classes, and behavior modification. We discussed that often times setting smaller, more achievable goals, such as eliminating 1 cigarette a day for a week and then 2 cigarettes a day for a week can be helpful in slowly decreasing the number of cigarettes smoked. This allows for a sense of accomplishment as well as providing a clinical benefit. I provided  her  with smoking cessation  information  with contact information for community resources, classes,  free nicotine  replacement therapy, and access to mobile apps, text messaging, and on-line smoking cessation help. I have also provided  her  the office contact information in the event she needs to contact me, or the screening staff. We discussed the time and location of the scan, and that either Abigail Miyamoto RN, Karlton Lemon, RN  or I will call / send a letter with the results within 24-72 hours of receiving them. The patient verbalized understanding of all of  the above and had no further questions upon leaving the office. They have my contact information in the event they have any further questions. ? ?I spent 3 minutes counseling on smoking cessation and the health risks of continued tobacco abuse. ? ?I explained to the patient that there has been a high incidence of coronary artery disease noted on these exams. I explained that this is a non-gated exam therefore degree or severity cannot be determined. This patient is  not on statin therapy. I have asked the patient to follow-up with their PCP regarding any incidental finding of coronary artery disease and management with diet or medication as their PCP  feels is clinically indicated. The patient verbalized understanding of the above and had no further questions upon completion of the visit. ? ?  ? ? ?Bevelyn Ngo, NP ?03/09/2021 ? ? ? ? ? ? ?

## 2021-03-09 NOTE — Patient Instructions (Signed)
Thank you for participating in the [place] Lung Cancer Screening Program. °It was our pleasure to meet you today. °We will call you with the results of your scan within the next few days. °Your scan will be assigned a Lung RADS category score by the physicians reading the scans.  °This Lung RADS score determines follow up scanning.  °See below for description of categories, and follow up screening recommendations. °We will be in touch to schedule your follow up screening annually or based on recommendations of our providers. °We will fax a copy of your scan results to your Primary Care Physician, or the physician who referred you to the program, to ensure they have the results. °Please call the office if you have any questions or concerns regarding your scanning experience or results.  °Our office number is [phone]. °Please speak with Denise Phelps, RN. She is our Lung Cancer Screening RN. °If she is unavailable when you call, please have the office staff send her a message. She will return your call at her earliest convenience. °Remember, if your scan is normal, we will scan you annually as long as you continue to meet the criteria for the program. (Age 55-77, Current smoker or smoker who has quit within the last 15 years). °If you are a smoker, remember, quitting is the single most powerful action that you can take to decrease your risk of lung cancer and other pulmonary, breathing related problems. °We know quitting is hard, and we are here to help.  °Please let us know if there is anything we can do to help you meet your goal of quitting. °If you are a former smoker, congratulations. We are proud of you! Remain smoke free! °Remember you can refer friends or family members through the number above.  °We will screen them to make sure they meet criteria for the program. °Thank you for helping us take better care of you by participating in Lung Screening. ° °You can receive free nicotine replacement therapy  ( patches, gum or mints) by calling 1-800-QUIT NOW. Please call so we can get you on the path to becoming  a non-smoker. I know it is hard, but you can do this! ° °Lung RADS Categories: ° °Lung RADS 1: no nodules or definitely non-concerning nodules.  °Recommendation is for a repeat annual scan in 12 months. ° °Lung RADS 2:  nodules that are non-concerning in appearance and behavior with a very low likelihood of becoming an active cancer. °Recommendation is for a repeat annual scan in 12 months. ° °Lung RADS 3: nodules that are probably non-concerning , includes nodules with a low likelihood of becoming an active cancer.  Recommendation is for a 6-month repeat screening scan. Often noted after an upper respiratory illness. We will be in touch to make sure you have no questions, and to schedule your 6-month scan. ° °Lung RADS 4 A: nodules with concerning findings, recommendation is most often for a follow up scan in 3 months or additional testing based on our provider's assessment of the scan. We will be in touch to make sure you have no questions and to schedule the recommended 3 month follow up scan. ° °Lung RADS 4 B:  indicates findings that are concerning. We will be in touch with you to schedule additional diagnostic testing based on our provider's  assessment of the scan. ° °Hypnosis for smoking cessation  °[employer] °[phone] ° °Acupuncture for smoking cessation  °[facility] °[phone]  °

## 2021-03-10 ENCOUNTER — Ambulatory Visit (INDEPENDENT_AMBULATORY_CARE_PROVIDER_SITE_OTHER)
Admission: RE | Admit: 2021-03-10 | Discharge: 2021-03-10 | Disposition: A | Discharge status: Home / Self Care | Payer: BC Managed Care – PPO | Source: Ambulatory Visit | Attending: Acute Care | Admitting: Acute Care

## 2021-03-10 ENCOUNTER — Telehealth: Payer: Self-pay | Admitting: Acute Care

## 2021-03-10 ENCOUNTER — Other Ambulatory Visit: Payer: Self-pay

## 2021-03-10 DIAGNOSIS — F17210 Nicotine dependence, cigarettes, uncomplicated: Secondary | ICD-10-CM

## 2021-03-10 DIAGNOSIS — R911 Solitary pulmonary nodule: Secondary | ICD-10-CM

## 2021-03-10 DIAGNOSIS — Z87891 Personal history of nicotine dependence: Secondary | ICD-10-CM

## 2021-03-10 DIAGNOSIS — J439 Emphysema, unspecified: Secondary | ICD-10-CM

## 2021-03-10 NOTE — Telephone Encounter (Signed)
I have called the patient with the results of her low dose CT Chest. I explained that here scan was read as a Lung  RADS 3, nodules that are probably benign findings, short term follow up suggested: includes nodules with a low likelihood of becoming a clinically active cancer. Radiology recommends a 6 month repeat LDCT follow up. I explained that there is a 7.8 mm nodule that we would like to re-evaluate for stability in 6 months vs a year. She is in agreement with this plan. There were no additional incidental findings noted on the scan. ?Angelique Blonder, please fax results to PCP and place order for 6 month follow up LDCT. Thanks so much ?

## 2021-03-10 NOTE — Telephone Encounter (Signed)
This is an FYI to the provider.  °

## 2021-03-10 NOTE — Telephone Encounter (Signed)
Order placed and results routed to PCP with plan for follow up CT 6 months ?

## 2021-03-22 ENCOUNTER — Ambulatory Visit: Payer: BC Managed Care – PPO | Admitting: Internal Medicine

## 2021-03-22 ENCOUNTER — Other Ambulatory Visit: Payer: Self-pay

## 2021-03-22 ENCOUNTER — Encounter: Payer: Self-pay | Admitting: Internal Medicine

## 2021-03-22 DIAGNOSIS — J431 Panlobular emphysema: Secondary | ICD-10-CM

## 2021-03-22 DIAGNOSIS — J418 Mixed simple and mucopurulent chronic bronchitis: Secondary | ICD-10-CM | POA: Diagnosis not present

## 2021-03-22 DIAGNOSIS — Z23 Encounter for immunization: Secondary | ICD-10-CM | POA: Diagnosis not present

## 2021-03-22 MED ORDER — TRELEGY ELLIPTA 100-62.5-25 MCG/ACT IN AEPB
1.0000 | INHALATION_SPRAY | Freq: Every day | RESPIRATORY_TRACT | 1 refills | Status: DC
Start: 2021-03-22 — End: 2021-04-27

## 2021-03-22 NOTE — Patient Instructions (Signed)
Chronic Obstructive Pulmonary Disease ?Chronic obstructive pulmonary disease (COPD) is a long-term (chronic) condition that affects the lungs. COPD is a general term that can be used to describe many different lung problems that cause lung inflammation and limit airflow, including chronic bronchitis and emphysema. ?If you have COPD, your lung function will probably never return to normal. In most cases, it gets worse over time. However, there are steps you can take to slow the progression of the disease and improve your quality of life. ?What are the causes? ?This condition may be caused by: ?Smoking. This is the most common cause. ?Certain genes passed down through families. ?What increases the risk? ?The following factors may make you more likely to develop this condition: ?Being exposed to secondhand smoke from cigarettes, pipes, or cigars. ?Being exposed to chemicals and other irritants, such as fumes and dust in the work environment. ?Having chronic lung conditions or infections. ?What are the signs or symptoms? ?Symptoms of this condition include: ?Shortness of breath, especially during physical activity. ?Chronic cough with a large amount of thick mucus. Sometimes, the cough may not have any mucus (dry cough). ?Wheezing and rapid breathing. ?Gray or bluish discoloration (cyanosis) of the skin, especially in the fingers, toes, or lips. ?Feeling tired (fatigue). ?Weight loss. ?Chest tightness. ?Frequent infections. ?Episodes when breathing symptoms become much worse (exacerbations). ?At the later stages of this disease, you may have swelling in the ankles, feet, or legs. ?How is this diagnosed? ?This condition is diagnosed based on: ?Your medical history. ?A physical exam. ?You may also have tests, including: ?Lung (pulmonary) function tests. This may include a spirometry test, which measures your ability to exhale properly. ?Chest X-ray. ?CT scan. ?Blood tests. ?How is this treated? ?This condition may be  treated with: ?Medicines. These may include inhaled rescue medicines to treat acute exacerbations as well as medicines that you take long-term (maintenance medicines) to prevent flare-ups of COPD. ?Bronchodilators help treat COPD by dilating the airways to allow increased airflow and make your breathing more comfortable. ?Steroids can reduce airway inflammation and help prevent exacerbations. ?Smoking cessation. If you smoke, your health care provider may ask you to quit, and may also recommend therapy or replacement products to help you quit. ?Pulmonary rehabilitation. This may involve working with a team of health care providers and specialists, such as respiratory, occupational, and physical therapists. ?Exercise and physical activity. These are beneficial for nearly all people with COPD. ?Nutrition therapy to gain weight, if you are underweight. ?Oxygen. Supplemental oxygen therapy is only helpful if you have a low oxygen level in your blood (hypoxemia). ?Lung surgery or transplant. ?Palliative care. This is to help people with COPD feel comfortable when treatment is no longer working. ?Follow these instructions at home: ?Medicines ?Take over-the-counter and prescription medicines only as told by your health care provider. This includes inhaled medicines and pills. ?Talk to your health care provider before taking any cough or allergy medicines. You may need to avoid certain medicines that dry out your airways. ?Lifestyle ?If you smoke, the most important thing that you can do is to stop smoking. Continuing to smoke will cause the disease to progress faster. ?Do not use any products that contain nicotine or tobacco. These products include cigarettes, chewing tobacco, and vaping devices, such as e-cigarettes. If you need help quitting, ask your health care provider. ?Avoid exposure to things that irritate your lungs, such as smoke, chemicals, and fumes. ?Stay active, but balance activity with periods of rest.  Exercise and physical   activity will help you maintain your ability to do things you want to do. ?Learn and use relaxation techniques to manage stress and to control your breathing. ?Get the right amount of sleep and get quality sleep. Most adults need 7 or more hours per night. ?Eat healthy foods. Eating smaller, more frequent meals and resting before meals may help you maintain your strength. ?Controlled breathing ?Learn and use controlled breathing techniques as directed by your health care provider. Controlled breathing techniques include: ?Pursed lip breathing. Start by breathing in (inhaling) through your nose for 1 second. Then, purse your lips as if you were going to whistle and breathe out (exhale) through the pursed lips for 2 seconds. ?Diaphragmatic breathing. Start by putting one hand on your abdomen just above your waist. Inhale slowly through your nose. The hand on your abdomen should move out. Then purse your lips and exhale slowly. You should be able to feel the hand on your abdomen moving in as you exhale. ? ?Controlled coughing ?Learn and use controlled coughing to clear mucus from your lungs. Controlled coughing is a series of short, progressive coughs. The steps of controlled coughing are: ?Lean your head slightly forward. ?Breathe in deeply using diaphragmatic breathing. ?Try to hold your breath for 3 seconds. ?Keep your mouth slightly open while coughing twice. ?Spit any mucus out into a tissue. ?Rest and repeat the steps once or twice as needed. ?General instructions ?Make sure you receive all the vaccines that your health care provider recommends, especially the pneumococcal and influenza vaccines. Preventing infection and hospitalization is very important when you have COPD. ?Drink enough fluid to keep your urine pale yellow, unless you have a medical condition that requires fluid restriction. ?Use oxygen therapy and pulmonary rehabilitation if told by your health care provider. If you  require home oxygen therapy, ask your health care provider whether you should purchase a pulse oximeter to measure your oxygen level at home. ?Work with your health care provider to develop a COPD action plan. This will help you know what steps to take if your condition gets worse. ?Keep other chronic health conditions under control as told by your health care provider. ?Avoid extreme temperature and humidity changes. ?Avoid contact with people who have an illness that spreads from person to person (is contagious), such as viral infections or pneumonia. ?Keep all follow-up visits. This is important. ?Contact a health care provider if: ?You are coughing up more mucus than usual. ?There is a change in the color or thickness of your mucus. ?Your breathing is more labored than usual. ?Your breathing is faster than usual. ?You have difficulty sleeping. ?You need to use your rescue medicines or inhalers more often than expected. ?You have trouble doing routine activities such as getting dressed or walking around the house. ?Get help right away if: ?You have shortness of breath while you are resting. ?You have shortness of breath that prevents you from: ?Being able to talk. ?Performing your usual physical activities. ?You have chest pain lasting longer than 5 minutes. ?Your skin color is more blue (cyanotic) than usual. ?You measure low oxygen saturations for longer than 5 minutes with a pulse oximeter. ?You have a fever. ?You feel too tired to breathe normally. ?These symptoms may represent a serious problem that is an emergency. Do not wait to see if the symptoms will go away. Get medical help right away. Call your local emergency services (911 in the [place] Do not drive yourself to the hospital. ?Summary ?Chronic obstructive pulmonary   disease (COPD) is a long-term (chronic) condition that affects the lungs. ?Your lung function will probably never return to normal. In most cases, it gets worse over time. However, there  are steps you can take to slow the progression of the disease and improve your quality of life. ?Treatment for COPD may include taking medicines, quitting smoking, pulmonary rehabilitation, and changes to diet and e

## 2021-03-22 NOTE — Progress Notes (Signed)
? ?Subjective:  ?Patient ID: Jill Glenn, female    DOB: [birth year]  Age: 56 y.o. MRN: [id number] ? ?CC: COPD ? ?This visit occurred during the SARS-CoV-2 public health emergency.  Safety protocols were in place, including screening questions prior to the visit, additional usage of staff PPE, and extensive cleaning of exam room while observing appropriate contact time as indicated for disinfecting solutions.   ? ?HPI ?[name] presents for f/up -  ? ?Her cough has decreased since she started using trelegy. It is rarely productive of clear phlegm. ? ?Outpatient Medications Prior to Visit  ?Medication Sig Dispense Refill  ? acetaminophen (TYLENOL) 325 MG tablet Take 2 tablets (650 mg total) by mouth every 6 (six) hours as needed for moderate pain, mild pain or headache.    ? albuterol (VENTOLIN HFA) 108 (90 Base) MCG/ACT inhaler Inhale 2 puffs into the lungs every 6 (six) hours as needed for wheezing or shortness of breath. 18 g 0  ? Cranberry 500 MG TABS Take 1 tablet by mouth daily as needed (uti prevention).    ? guaiFENesin (MUCINEX) 600 MG 12 hr tablet Take 600 mg by mouth 2 (two) times daily as needed for cough.    ? Multiple Vitamin (MULTIVITAMIN ADULT) TABS Take 1 tablet by mouth daily.    ? nicotine (NICODERM CQ - DOSED IN MG/24 HOURS) 21 mg/24hr patch Place 1 patch (21 mg total) onto the skin daily. 28 patch 0  ? vitamin C (ASCORBIC ACID) 500 MG tablet Take 500 mg by mouth daily.    ? TRELEGY ELLIPTA 100-62.5-25 MCG/ACT AEPB Inhale 1 puff into the lungs daily. 120 each 1  ? ?No facility-administered medications prior to visit.  ? ? ?ROS ?Review of Systems  ?Constitutional:  Negative for chills, fatigue and fever.  ?HENT: Negative.  Negative for sore throat and trouble swallowing.   ?Eyes: Negative.   ?Respiratory:  Positive for cough. Negative for chest tightness, shortness of breath and wheezing.   ?Cardiovascular:  Negative for chest pain, palpitations and leg swelling.  ?Gastrointestinal:   Negative for abdominal pain, diarrhea and nausea.  ?Endocrine: Negative.   ?Genitourinary: Negative.  Negative for difficulty urinating.  ?Musculoskeletal: Negative.   ?Skin: Negative.   ?Allergic/Immunologic: Negative.   ?Neurological: Negative.  Negative for dizziness.  ?Hematological:  Negative for adenopathy. Does not bruise/bleed easily.  ?Psychiatric/Behavioral: Negative.    ? ?Objective:  ?BP 126/72 (BP Location: Left Arm, Patient Position: Sitting, Cuff Size: Large)   Pulse 61   Temp 97.8 ?F (36.6 ?C) (Oral)   Ht 5\' 3"  (1.6 m)   Wt 127 lb 4 oz (57.7 kg)   SpO2 94%   BMI 22.54 kg/m?  ? ?BP Readings from Last 3 Encounters:  ?03/22/21 126/72  ?12/21/20 114/78  ?11/03/20 100/71  ? ? ?Wt Readings from Last 3 Encounters:  ?03/22/21 127 lb 4 oz (57.7 kg)  ?12/21/20 129 lb (58.5 kg)  ?11/02/20 150 lb (68 kg)  ? ? ?Physical Exam ?Vitals reviewed.  ?Constitutional:   ?   Appearance: She is not ill-appearing.  ?HENT:  ?   Mouth/Throat:  ?   Mouth: Mucous membranes are moist.  ?Eyes:  ?   General: No scleral icterus. ?   Conjunctiva/sclera: Conjunctivae normal.  ?Cardiovascular:  ?   Rate and Rhythm: Normal rate and regular rhythm.  ?   Heart sounds: No murmur heard. ?Pulmonary:  ?   Effort: Pulmonary effort is normal.  ?   Breath sounds: No stridor.  No wheezing, rhonchi or rales.  ?Abdominal:  ?   General: Abdomen is flat.  ?   Palpations: There is no mass.  ?   Tenderness: There is no abdominal tenderness. There is no guarding.  ?   Hernia: No hernia is present.  ?Musculoskeletal:     ?   General: Normal range of motion.  ?   Cervical back: Neck supple.  ?   Right lower leg: No edema.  ?   Left lower leg: No edema.  ?Lymphadenopathy:  ?   Cervical: No cervical adenopathy.  ?Skin: ?   General: Skin is warm and dry.  ?Neurological:  ?   General: No focal deficit present.  ?   Mental Status: She is alert.  ?Psychiatric:     ?   Mood and Affect: Mood normal.     ?   Behavior: Behavior normal.  ? ? ?Lab Results   ?Component Value Date  ? WBC 8.3 11/02/2020  ? HGB 14.6 11/02/2020  ? HCT 43.0 11/02/2020  ? PLT 338 11/02/2020  ? GLUCOSE 94 11/03/2020  ? CHOL 177 12/21/2020  ? TRIG 59.0 12/21/2020  ? HDL 74.10 12/21/2020  ? [place] 91 12/21/2020  ? ALT 14 11/02/2020  ? AST 21 11/02/2020  ? NA 137 11/03/2020  ? K 4.1 11/03/2020  ? CL 105 11/03/2020  ? CREATININE 0.71 11/03/2020  ? BUN 10 11/03/2020  ? CO2 24 11/03/2020  ? ? ?CT CHEST LUNG CA SCREEN LOW DOSE W/O CM ? ?Result Date: 03/10/2021 ?CLINICAL DATA:  Current smoker, 60 pack-year history. EXAM: CT CHEST WITHOUT CONTRAST LOW-DOSE FOR LUNG CANCER SCREENING TECHNIQUE: Multidetector CT imaging of the chest was performed following the standard protocol without IV contrast. RADIATION DOSE REDUCTION: This exam was performed according to the departmental dose-optimization program which includes automated exposure control, adjustment of the mA and/or kV according to patient size and/or use of iterative reconstruction technique. COMPARISON:  None. FINDINGS: Cardiovascular: Heart size normal.  No pericardial effusion. Mediastinum/Nodes: No pathologically enlarged mediastinal or axillary lymph nodes. Hilar regions are difficult to definitively evaluate without IV contrast. Esophagus is grossly unremarkable. Lungs/Pleura: Biapical pleuroparenchymal scarring. Centrilobular and paraseptal emphysema. Numerous calcified granulomas. Subpleural atelectasis or scarring in the posterior right upper lobe (3/86). 7.8 mm subpleural left lower lobe nodule (3/293). No pleural fluid. Minimal debris in the airway. Upper Abdomen: Visualized portions of the liver, adrenal glands, kidneys, spleen, pancreas, stomach and bowel are grossly unremarkable. Musculoskeletal: Degenerative changes in the spine. No worrisome lytic or sclerotic lesions. IMPRESSION: 1. Lung-RADS 3, probably benign findings. Short-term follow-up in 6 months is recommended with repeat low-dose chest CT without contrast (please use the  following order, "CT CHEST LCS NODULE FOLLOW-UP W/O CM"). 7.8 mm subpleural nodule in the left lower lobe, possibly a benign subpleural lymph node. These results will be called to the ordering clinician or representative by the Radiologist Assistant, and communication documented in the PACS or [employer]. 2.  Emphysema (ICD10-J43.9). Electronically Signed   By: Lorin Picket M.D.   On: 03/10/2021 14:34  ? ? ?Assessment & Plan:  ? ?Shyan was seen today for copd. ? ?Diagnoses and all orders for this visit: ? ?Mixed simple and mucopurulent chronic bronchitis ([place])- She has improved. Will continue triple therapy. ?-     TRELEGY ELLIPTA 100-62.5-25 MCG/ACT AEPB; Inhale 1 puff into the lungs daily. ? ?Panlobular emphysema ([place]) ?-     TRELEGY ELLIPTA 100-62.5-25 MCG/ACT AEPB; Inhale 1 puff into the lungs daily. ? ?  Other orders ?-     Tdap vaccine greater than or equal to 7yo IM ?-     Varicella-zoster vaccine IM (Shingrix) ? ? ?I am having Mariem L. Jun maintain her guaiFENesin, vitamin C, Multivitamin Adult, Cranberry, albuterol, acetaminophen, nicotine, and Trelegy Ellipta. ? ?Meds ordered this encounter  ?Medications  ? TRELEGY ELLIPTA 100-62.5-25 MCG/ACT AEPB  ?  Sig: Inhale 1 puff into the lungs daily.  ?  Dispense:  120 each  ?  Refill:  1  ? ? ? ?Follow-up: Return in about 4 months (around 07/22/2021). ? ?Scarlette Calico, MD ?

## 2021-04-27 ENCOUNTER — Other Ambulatory Visit: Payer: Self-pay | Admitting: Internal Medicine

## 2021-04-27 DIAGNOSIS — J431 Panlobular emphysema: Secondary | ICD-10-CM

## 2021-04-27 DIAGNOSIS — J418 Mixed simple and mucopurulent chronic bronchitis: Secondary | ICD-10-CM

## 2021-07-21 ENCOUNTER — Telehealth: Payer: BC Managed Care – PPO | Admitting: Physician Assistant

## 2021-07-21 DIAGNOSIS — H60391 Other infective otitis externa, right ear: Secondary | ICD-10-CM | POA: Diagnosis not present

## 2021-07-21 DIAGNOSIS — H6011 Cellulitis of right external ear: Secondary | ICD-10-CM | POA: Diagnosis not present

## 2021-07-21 MED ORDER — AMOXICILLIN 500 MG PO CAPS: 500.0000 mg | ORAL_CAPSULE | Freq: Two times a day (BID) | ORAL | 0 refills | Status: AC

## 2021-07-21 MED ORDER — CIPROFLOXACIN-DEXAMETHASONE 0.3-0.1 % OT SUSP: 4.0000 [drp] | Freq: Two times a day (BID) | OTIC | 0 refills | Status: DC

## 2021-07-21 NOTE — Progress Notes (Signed)
Virtual Visit Consent   Jill Glenn, you are scheduled for a virtual visit with a [facility] provider today. Just as with appointments in the office, your consent must be obtained to participate. Your consent will be active for this visit and any virtual visit you may have with one of our providers in the next 365 days. If you have a MyChart account, a copy of this consent can be sent to you electronically.  As this is a virtual visit, video technology does not allow for your provider to perform a traditional examination. This may limit your provider's ability to fully assess your condition. If your provider identifies any concerns that need to be evaluated in person or the need to arrange testing (such as labs, EKG, etc.), we will make arrangements to do so. Although advances in technology are sophisticated, we cannot ensure that it will always work on either your end or our end. If the connection with a video visit is poor, the visit may have to be switched to a telephone visit. With either a video or telephone visit, we are not always able to ensure that we have a secure connection.  By engaging in this virtual visit, you consent to the provision of healthcare and authorize for your insurance to be billed (if applicable) for the services provided during this visit. Depending on your insurance coverage, you may receive a charge related to this service.  I need to obtain your verbal consent now. Are you willing to proceed with your visit today? Jill Glenn has provided verbal consent on 07/21/2021 for a virtual visit (video or telephone). Jill Loveless, PA-C  Date: 07/21/2021 12:12 PM  Virtual Visit via Video Note   IMargaretann Glenn, connected with  Jill Glenn  ([id number], 11-Jun-1965) on 07/21/21 at 12:00 PM EDT by a video-enabled telemedicine application and verified that I am speaking with the correct person using two identifiers.  Location: Patient: Virtual  Visit Location Patient: Home Provider: Virtual Visit Location Provider: Home Office   I discussed the limitations of evaluation and management by telemedicine and the availability of in person appointments. The patient expressed understanding and agreed to proceed.    History of Present Illness: Jill Glenn is a 56 y.o. who identifies as a female who was assigned female at birth, and is being seen today for ear drainage.  HPI: Ear Drainage  There is pain in the right ear. This is a new problem. Episode onset: overnight around 2am. The problem occurs constantly. The problem has been unchanged. There has been no fever. The pain is moderate. Associated symptoms include ear discharge (bloody discharge and purulent drainage), headaches and hearing loss. Pertinent negatives include no coughing, rhinorrhea or sore throat. Associated symptoms comments: Ear swelling, ear pain. She has tried acetaminophen (heating pad) for the symptoms. The treatment provided no relief. Her past medical history is significant for a chronic ear infection. There is no history of hearing loss or a tympanostomy tube.      Problems:  Patient Active Problem List   Diagnosis Date Noted   Mixed simple and mucopurulent chronic bronchitis (HCC) 12/21/2020   Panlobular emphysema (HCC) 12/21/2020   Chronic cough 12/21/2020   Need for hepatitis C screening test 12/21/2020   Encounter for general adult medical examination with abnormal findings 12/21/2020   Need for vaccination 12/21/2020   Screen for colon cancer 12/21/2020   Visit for screening mammogram 12/21/2020   Influenza A 11/02/2020  Acute respiratory failure with hypoxia (HCC)    Tobacco use disorder    Hypokalemia     Allergies:  Allergies  Allergen Reactions   Codeine    Medications:  Current Outpatient Medications:    amoxicillin (AMOXIL) 500 MG capsule, Take 1 capsule (500 mg total) by mouth 2 (two) times daily for 10 days., Disp: 20 capsule, Rfl:  0   ciprofloxacin-dexamethasone (CIPRODEX) OTIC suspension, Place 4 drops into the right ear 2 (two) times daily., Disp: 7.5 mL, Rfl: 0   acetaminophen (TYLENOL) 325 MG tablet, Take 2 tablets (650 mg total) by mouth every 6 (six) hours as needed for moderate pain, mild pain or headache., Disp: , Rfl:    albuterol (VENTOLIN HFA) 108 (90 Base) MCG/ACT inhaler, Inhale 2 puffs into the lungs every 6 (six) hours as needed for wheezing or shortness of breath., Disp: 18 g, Rfl: 0   Cranberry 500 MG TABS, Take 1 tablet by mouth daily as needed (uti prevention)., Disp: , Rfl:    guaiFENesin (MUCINEX) 600 MG 12 hr tablet, Take 600 mg by mouth 2 (two) times daily as needed for cough., Disp: , Rfl:    Multiple Vitamin (MULTIVITAMIN ADULT) TABS, Take 1 tablet by mouth daily., Disp: , Rfl:    nicotine (NICODERM CQ - DOSED IN MG/24 HOURS) 21 mg/24hr patch, Place 1 patch (21 mg total) onto the skin daily., Disp: 28 patch, Rfl: 0   TRELEGY ELLIPTA 100-62.5-25 MCG/ACT AEPB, Inhale 1 puff by mouth once daily, Disp: 60 each, Rfl: 0   vitamin C (ASCORBIC ACID) 500 MG tablet, Take 500 mg by mouth daily., Disp: , Rfl:   Observations/Objective: Patient is well-developed, well-nourished in no acute distress.  Resting comfortably at home.  Head is normocephalic, atraumatic.  No labored breathing.  Speech is clear and coherent with logical content.  Patient is alert and oriented at baseline.    Assessment and Plan: 1. Other infective acute otitis externa of right ear - amoxicillin (AMOXIL) 500 MG capsule; Take 1 capsule (500 mg total) by mouth 2 (two) times daily for 10 days.  Dispense: 20 capsule; Refill: 0 - ciprofloxacin-dexamethasone (CIPRODEX) OTIC suspension; Place 4 drops into the right ear 2 (two) times daily.  Dispense: 7.5 mL; Refill: 0  2. Cellulitis of right ear  - Unknown if possible ruptured ear drum vs severe otitis externa - Will give Ciprodex drops to be safe in case ear drum is ruptured -  Amoxicillin for acute infection and ear cellulitis - May continue tylenol as needed - Warm compresses - Seek in person evaluation if not improving or if symptoms worsen  Follow Up Instructions: I discussed the assessment and treatment plan with the patient. The patient was provided an opportunity to ask questions and all were answered. The patient agreed with the plan and demonstrated an understanding of the instructions.  A copy of instructions were sent to the patient via MyChart unless otherwise noted below.    The patient was advised to call back or seek an in-person evaluation if the symptoms worsen or if the condition fails to improve as anticipated.  Time:  I spent 13 minutes with the patient via telehealth technology discussing the above problems/concerns.    Jill Loveless, PA-C

## 2021-07-21 NOTE — Patient Instructions (Signed)
Jill Glenn, thank you for joining [name], PA-C for today's virtual visit.  While this provider is not your primary care provider (PCP), if your PCP is located in our provider database this encounter information will be shared with them immediately following your visit.  Consent: (Patient) Jill Humphrey Dysert provided verbal consent for this virtual visit at the beginning of the encounter.  Current Medications:  Current Outpatient Medications:    amoxicillin (AMOXIL) 500 MG capsule, Take 1 capsule (500 mg total) by mouth 2 (two) times daily for 10 days., Disp: 20 capsule, Rfl: 0   ciprofloxacin-dexamethasone (CIPRODEX) OTIC suspension, Place 4 drops into the right ear 2 (two) times daily., Disp: 7.5 mL, Rfl: 0   acetaminophen (TYLENOL) 325 MG tablet, Take 2 tablets (650 mg total) by mouth every 6 (six) hours as needed for moderate pain, mild pain or headache., Disp: , Rfl:    albuterol (VENTOLIN HFA) 108 (90 Base) MCG/ACT inhaler, Inhale 2 puffs into the lungs every 6 (six) hours as needed for wheezing or shortness of breath., Disp: 18 g, Rfl: 0   Cranberry 500 MG TABS, Take 1 tablet by mouth daily as needed (uti prevention)., Disp: , Rfl:    guaiFENesin (MUCINEX) 600 MG 12 hr tablet, Take 600 mg by mouth 2 (two) times daily as needed for cough., Disp: , Rfl:    Multiple Vitamin (MULTIVITAMIN ADULT) TABS, Take 1 tablet by mouth daily., Disp: , Rfl:    nicotine (NICODERM CQ - DOSED IN MG/24 HOURS) 21 mg/24hr patch, Place 1 patch (21 mg total) onto the skin daily., Disp: 28 patch, Rfl: 0   TRELEGY ELLIPTA 100-62.5-25 MCG/ACT AEPB, Inhale 1 puff by mouth once daily, Disp: 60 each, Rfl: 0   vitamin C (ASCORBIC ACID) 500 MG tablet, Take 500 mg by mouth daily., Disp: , Rfl:    Medications ordered in this encounter:  Meds ordered this encounter  Medications   amoxicillin (AMOXIL) 500 MG capsule    Sig: Take 1 capsule (500 mg total) by mouth 2 (two) times daily for 10 days.     Dispense:  20 capsule    Refill:  0    Order Specific Question:   Supervising Provider    Answer:   Hyacinth Meeker, BRIAN [[id number]]   ciprofloxacin-dexamethasone (CIPRODEX) OTIC suspension    Sig: Place 4 drops into the right ear 2 (two) times daily.    Dispense:  7.5 mL    Refill:  0    Order Specific Question:   Supervising Provider    Answer:   Hyacinth Meeker, BRIAN [[id number]]     *If you need refills on other medications prior to your next appointment, please contact your pharmacy*  Follow-Up: Call back or seek an in-person evaluation if the symptoms worsen or if the condition fails to improve as anticipated.  Other Instructions Eardrum Rupture  An eardrum rupture is a hole (perforation) in the eardrum. The eardrum is a thin, round tissue inside the ear. It allows you to hear. This condition may cause pain and hearing loss. There is often little or no long-term hearing loss. What are the causes? This condition may be caused by: An infection. An injury from: Putting a thin object into the ear. Getting hit on the side of the head. Falling onto water or a flat surface. Changes in pressure that can happen from flying, scuba diving, or a very loud noise. Inserting a cotton swab in the ear. Long-term ear problems. Surgery on the ear. Getting  a tube called a PE tube removed or having it fall out. This is a tube placed during a surgery to help with ear problems. What increases the risk? Having had PE tubes put in your ears. Having an ear infection. Playing sports that: Involve balls or contact with other players. Take place in water, such as diving, scuba diving, or waterskiing. What are the signs or symptoms? Pain. Ringing in the ear. Fluid leaking from the ear. Hearing loss. Dizziness. How is this treated? The eardrum often heals on its own in a few weeks. If your eardrum does not heal, your doctor may recommend surgery to fix the eardrum. You may also need antibiotic medicine to help prevent  infection. Follow these instructions at home: Medicines Take over-the-counter and prescription medicines only as told by your doctor. If you were prescribed an antibiotic medicine, use it as told by your doctor. Do not stop using it even if you start to feel better. Caring for your ear Keep your ear dry. Follow instructions from your doctor about how to keep your ear dry. You may need to wear waterproof earplugs when bathing and swimming. If told, put heat on the affected ear to help with pain. Do this as often as told by your doctor. Use the heat source that your doctor recommends, such as a moist heat pack or a heating pad. Place a towel between your skin and the heat source. Leave the heat on for 20-30 minutes. Take off the heat if your skin turns bright red. This is very important. If you cannot feel pain, heat, or cold, you have a greater risk of getting burned. General instructions Return to your normal activities when your doctor says that it is safe. When you play sports in which ear injuries may happen, wear headgear with ear protection. Talk to your doctor before you fly on an airplane. Keep all follow-up visits. Contact a doctor if: You have a fever. You have ear pain. You have mucus or blood leaking from your ear. You cannot hear. You have ringing in the ear. You feel dizzy. Get help right away if: You have sudden hearing loss. You are very dizzy. You get very bad pain in your ear. You have weakness in your face. You cannot move parts of your face. These symptoms may be an emergency. Do not wait to see if the symptoms will go away. Get help right away. Call your local emergency services (911 in the [place] Summary An eardrum rupture is a tear that makes a hole in the eardrum. The eardrum will likely heal on its own within a few weeks. Follow instructions from your doctor about how to keep your ear dry and protected as it heals. This information is not intended to replace  advice given to you by your health care provider. Make sure you discuss any questions you have with your health care provider. Document Revised: 11/11/2019 Document Reviewed: 11/11/2019 Elsevier Patient Education  2023 Elsevier Inc.   Otitis Externa  Otitis externa is an infection of the outer ear canal. The outer ear canal is the area between the outside of the ear and the eardrum. Otitis externa is sometimes called swimmer's ear. What are the causes? Common causes of this condition include: Swimming in dirty water. Moisture in the ear. An injury to the inside of the ear. An object stuck in the ear. A cut or scrape on the outside of the ear or in the ear canal. What increases the risk? You  are more likely to get this condition if you go swimming often. What are the signs or symptoms? Itching in the ear. This is often the first symptom. Swelling of the ear. Redness in the ear. Ear pain. The pain may get worse when you pull on your ear. Pus coming from the ear. How is this treated? This condition may be treated with: Antibiotic ear drops. These are often given for 10-14 days. Medicines to reduce itching and swelling. Follow these instructions at home: If you were prescribed antibiotic ear drops, use them as told by your doctor. Do not stop using them even if you start to feel better. Take over-the-counter and prescription medicines only as told by your doctor. Avoid getting water in your ears as told by your doctor. You may be told to avoid swimming or water sports for a few days. Keep all follow-up visits. How is this prevented? Keep your ears dry. Use the corner of a towel to dry your ears after you swim or bathe. Try not to scratch or put things in your ear. Doing these things makes it easier for germs to grow in your ear. Avoid swimming in lakes, dirty water, or swimming pools that may not have the right amount of a chemical called chlorine. Contact a doctor if: You have a  fever. Your ear is still red, swollen, or painful after 3 days. You still have pus coming from your ear after 3 days. Your redness, swelling, or pain gets worse. You have a very bad headache. Get help right away if: You have redness, swelling, and pain or tenderness behind your ear. Summary Otitis externa is an infection of the outer ear canal. Symptoms include pain, redness, and swelling of the ear. If you were prescribed antibiotic ear drops, use them as told by your doctor. Do not stop using them even if you start to feel better. Try not to scratch or put things in your ear. This information is not intended to replace advice given to you by your health care provider. Make sure you discuss any questions you have with your health care provider. Document Revised: 03/04/2020 Document Reviewed: 03/04/2020 Elsevier Patient Education  2023 Elsevier Inc.    If you have been instructed to have an in-person evaluation today at a local Urgent Care facility, please use the link below. It will take you to a list of all of our available [facility], including address, phone number and hours of operation. Please do not delay care.  [facility]  If you or a family member do not have a primary care provider, use the link below to schedule a visit and establish care. When you choose a [facility] primary care physician or advanced practice provider, you gain a long-term partner in health. Find a Primary Care Provider  Learn more about [facility]'s in-office and virtual care options: [facility] - Get Care Now

## 2021-09-27 ENCOUNTER — Ambulatory Visit
Admission: RE | Admit: 2021-09-27 | Discharge: 2021-09-27 | Disposition: A | Discharge status: Home / Self Care | Payer: 59 | Source: Ambulatory Visit | Attending: Acute Care | Admitting: Acute Care

## 2021-09-27 DIAGNOSIS — R911 Solitary pulmonary nodule: Secondary | ICD-10-CM

## 2021-09-27 DIAGNOSIS — F17210 Nicotine dependence, cigarettes, uncomplicated: Secondary | ICD-10-CM

## 2021-09-27 DIAGNOSIS — Z87891 Personal history of nicotine dependence: Secondary | ICD-10-CM

## 2021-09-29 ENCOUNTER — Other Ambulatory Visit: Payer: Self-pay | Admitting: Acute Care

## 2021-09-29 DIAGNOSIS — Z87891 Personal history of nicotine dependence: Secondary | ICD-10-CM

## 2021-09-29 DIAGNOSIS — F17210 Nicotine dependence, cigarettes, uncomplicated: Secondary | ICD-10-CM

## 2021-09-29 DIAGNOSIS — Z122 Encounter for screening for malignant neoplasm of respiratory organs: Secondary | ICD-10-CM

## 2021-10-14 ENCOUNTER — Other Ambulatory Visit (HOSPITAL_COMMUNITY): Payer: Self-pay

## 2021-10-14 ENCOUNTER — Telehealth: Payer: Self-pay | Admitting: *Deleted

## 2021-10-14 ENCOUNTER — Ambulatory Visit: Payer: 59 | Admitting: Internal Medicine

## 2021-10-14 ENCOUNTER — Encounter: Payer: Self-pay | Admitting: Internal Medicine

## 2021-10-14 ENCOUNTER — Institutional Professional Consult (permissible substitution): Payer: 59 | Admitting: Pulmonary Disease

## 2021-10-14 VITALS — BP 110/60 | HR 81 | Temp 98.4°F | Ht 63.0 in | Wt 144.8 lb

## 2021-10-14 DIAGNOSIS — Z23 Encounter for immunization: Secondary | ICD-10-CM

## 2021-10-14 DIAGNOSIS — F17200 Nicotine dependence, unspecified, uncomplicated: Secondary | ICD-10-CM

## 2021-10-14 DIAGNOSIS — J4489 Other specified chronic obstructive pulmonary disease: Secondary | ICD-10-CM

## 2021-10-14 DIAGNOSIS — J439 Emphysema, unspecified: Secondary | ICD-10-CM

## 2021-10-14 MED ORDER — VARENICLINE TARTRATE 0.5 MG PO TABS: 0.5000 mg | ORAL_TABLET | Freq: Two times a day (BID) | ORAL | 0 refills | Status: DC

## 2021-10-14 MED ORDER — BREZTRI AEROSPHERE 160-9-4.8 MCG/ACT IN AERO: 2.0000 | INHALATION_SPRAY | Freq: Two times a day (BID) | RESPIRATORY_TRACT | 5 refills | Status: DC

## 2021-10-14 MED ORDER — VARENICLINE TARTRATE 1 MG PO TABS: 1.0000 mg | ORAL_TABLET | Freq: Two times a day (BID) | ORAL | 5 refills | Status: DC

## 2021-10-14 NOTE — Patient Instructions (Addendum)
Please schedule follow up scheduled with myself in 4 weeks.  If my schedule is not open yet, we will contact you with a reminder closer to that time. Please call [phone] if you haven't heard from [place] a month before.   Before your next visit I would like you to have: Full pft - 1 hour, can be same day or different day as appointment  Varenicline -- Varenicline (brand name: Chantix) is a prescription medication that works in the brain to reduce nicotine withdrawal symptoms and cigarette cravings. In several studies, it was more effective than placebo (a lookalike substitute that contains no medication.)  We are working to get this approved with your insurance - it needs a prior authorization.   It should be taken after eating with a full glass of water as follows: ?One 0.5 mg tablet daily for three days ?One 0.5 mg tablet twice daily for the next four days ?One 1.0 mg tablet twice daily starting at day 7  You should plan to quit smoking between one and four weeks after starting varenicline.   You should continue it for 12 weeks before concluding if it is working; if you successfully quit at 12 weeks, you may continue taking it for an additional 12 weeks. If you have not quit after taking varenicline for 12 weeks, talk to your health care provider about the next step. Options include working harder to make behavioral changes and continuing varenicline or switching to another treatment.  Common side effects of varenicline include nausea and abnormal dreams.  In the very early years of Chantix there were some concerns about Chantix affecting mood and depression.  However, there has been lots of research on this and this is no longer a concern.  Chantix is considered safe to use even in patients taking medication for depression.   In 2011, the [place] Food and Drug Administration (FDA) issued an advisory that, in people who already have heart or blood vessel disease, varenicline may increase the risk  of acute heart problems. If there is such a risk, it appears to be small, and is likely outweighed by the benefits of quitting smoking.   Start breztri inhaler 2 puffs twice a day. Gargle after use.  Take the albuterol rescue inhaler every 4 to 6 hours as needed for wheezing or shortness of breath. You can also take it 15 minutes before exercise or exertional activity. Side effects include heart racing or pounding, jitters or anxiety. If you have a history of an irregular heart rhythm, it can make this worse. Can also give some patients a hard time sleeping.  Understanding COPD   What is COPD? COPD stands for chronic obstructive pulmonary (lung) disease. COPD is a general term used for several lung diseases.  COPD is an umbrella term and encompasses other  common diseases in this group like chronic bronchitis and emphysema. Chronic asthma may also be included in this group. While some patients with COPD have only chronic bronchitis or emphysema, most patients have a combination of both.  You might hear these terms used in exchange for one another.   COPD adds to the work of the heart. Diseased lungs may reduce the amount of oxygen that goes to the blood. High blood pressure in blood vessels from the heart to the lungs makes it difficult for the heart to pump. Lung disease can also cause the body to produce too many red blood cells which may make the blood thicker and harder to pump.  Patients who have COPD with low oxygen levels may develop an enlarged heart (cor pulmonale). This condition weakens the heart and causes increased shortness of breath and swelling in the legs and feet.   Chronic bronchitis Chronic bronchitis is irritation and inflammation (swelling) of the lining in the bronchial tubes (air passages). The irritation causes coughing and an excess amount of mucus in the airways. The swelling makes it difficult to get air in and out of the lungs. The small, hair-like structures on the  inside of the airways (called cilia) may be damaged by the irritation. The cilia are then unable to help clean mucus from the airways.  Bronchitis is generally considered to be chronic when you have: a productive cough (cough up mucus) and shortness of breath that lasts about 3 months or more each year for 2 or more years in a row. Your doctor may define chronic bronchitis differently.   Emphysema Emphysema is the destruction, or breakdown, of the walls of the alveoli (air sacs) located at the end of the bronchial tubes. The damaged alveoli are not able to exchange oxygen and carbon dioxide between the lungs and the blood. The bronchioles lose their elasticity and collapse when you exhale, trapping air in the lungs. The trapped air keeps fresh air and oxygen from entering the lungs.   Who is affected by COPD? Emphysema and chronic bronchitis affect approximately 16 million people in the [place], or close to 11 percent of the population.   Symptoms of COPD  Shortness of breath  Shortness of breath with mild exercise (walking, using the stairs, etc.)  Chronic, productive cough (with mucus)  A feeling of "tightness" in the chest  Wheezing   What causes COPD? The two primary causes of COPD are cigarette smoking and alpha1-antitrypsin (AAT) deficiency. Air pollution and occupational dusts may also contribute to COPD, especially when the person exposed to these substances is a cigarette smoker.  Cigarette smoke causes COPD by irritating the airways and creating inflammation that narrows the airways, making it more difficult to breathe. Cigarette smoke also causes the cilia to stop working properly so mucus and trapped particles are not cleaned from the airways. As a result, chronic cough and excess mucus production develop, leading to chronic bronchitis.  In some people, chronic bronchitis and infections can lead to destruction of the small airways, or emphysema.  AAT deficiency, an inherited  disorder, can also lead to emphysema. Alpha antitrypsin (AAT) is a protective material produced in the liver and transported to the lungs to help combat inflammation. When there is not enough of the chemical AAT, the body is no longer protected from an enzyme in the white blood cells.   How is COPD diagnosed?  To diagnose COPD, the physician needs to know: Do you smoke?  Have you had chronic exposure to dust or air pollutants?  Do other members of your family have lung disease?  Are you short of breath?  Do you get short of breath with exercise?  Do you have chronic cough and/or wheezing?  Do you cough up excess mucus?  To help with the diagnosis, the physician will conduct a thorough physical exam which includes:  Listening to your lungs and heart  Checking your blood pressure and pulse  Examining your nose and throat  Checking your feet and ankles for swelling   Laboratory and other tests Several laboratory and other tests are needed to confirm a diagnosis of COPD. These tests may include:  Chest X-ray  to look for lung changes that could be caused by COPD   Spirometry and pulmonary function tests (PFTs) to determine lung volume and air flow  Pulse oximetry to measure the saturation of oxygen in the blood  Arterial blood gases (ABGs) to determine the amount of oxygen and carbon dioxide in the blood  Exercise testing to determine if the oxygen level in the blood drops during exercise   Treatment In the beginning stages of COPD, there is minimal shortness of breath that may be noticed only during exercise. As the disease progresses, shortness of breath may worsen and you may need to wear an oxygen device.   To help control other symptoms of COPD, the following treatments and lifestyle changes may be prescribed.  Quitting smoking  Avoiding cigarette smoke and other irritants  Taking medications including: a. bronchodilators b. anti-inflammatory agents c. oxygen d. antibiotics   Maintaining a healthy diet  Following a structured exercise program such as pulmonary rehabilitation Preventing respiratory infections  Controlling stress   If your COPD progresses, you may be eligible to be evaluated for lung volume reduction surgery or lung transplantation. You may also be eligible to participate in certain clinical trials (research studies). Ask your health care providers about studies being conducted in your hospital.   What is the outlook? Although COPD can not be cured, its symptoms can be treated and your quality of life can be improved. Your prognosis or outlook for the future will depend on how well your lungs are functioning, your symptoms, and how well you respond to and follow your treatment plan.  What are the benefits of quitting smoking? Quitting smoking can lower your chances of getting or dying from heart disease, lung disease, kidney failure, infection, or cancer. It can also lower your chances of getting osteoporosis, a condition that makes your bones weak. Plus, quitting smoking can help your skin look younger and reduce the chances that you will have problems with sex.  Quitting smoking will improve your health no matter how old you are, and no matter how long or how much you have smoked.  What should I do if I want to quit smoking? The letters in the word "START" can help you remember the steps to take: S = Set a quit date. T = Tell family, friends, and the people around you that you plan to quit. A = Anticipate or plan ahead for the tough times you'll face while quitting. R = Remove cigarettes and other tobacco products from your home, car, and work. T = Talk to your doctor about getting help to quit.  How can my doctor or nurse help? Your doctor or nurse can give you advice on the best way to quit. He or she can also put you in touch with counselors or other people you can call for support. Plus, your doctor or nurse can give you medicines  to: ?Reduce your craving for cigarettes ?Reduce the unpleasant symptoms that happen when you stop smoking (called "withdrawal symptoms"). You can also get help from a free phone line (1-800-QUIT-NOW) or go online to [employer].  What are the symptoms of withdrawal? The symptoms include: ?Trouble sleeping ?Being irritable, anxious or restless ?Getting frustrated or angry ?Having trouble thinking clearly  Some people who stop smoking become temporarily depressed. Some people need treatment for depression, such as counseling or antidepressant medicines. Depressed people might: ?No longer enjoy or care about doing the things they used to like to do ?Feel sad, down, hopeless,  nervous, or cranky most of the day, almost every day ?Lose or gain weight ?Sleep too much or too little ?Feel tired or like they have no energy ?Feel guilty or like they are worth nothing ?Forget things or feel confused ?Move and speak more slowly than usual ?Act restless or have trouble staying still ?Think about death or suicide  If you think you might be depressed, see your doctor or nurse. Only someone trained in mental health can tell for sure if you are depressed. If you ever feel like you might hurt yourself, go straight to the nearest emergency department. Or you can call for an ambulance (in the [place] and [place], [place] 9-1-1) or call your doctor or nurse right away and tell them it is an emergency. You can also reach the [place] National Suicide Prevention Lifeline at (680)609-5011 or http://walker-sanchez.info/.  How do medicines help you stop smoking? Different medicines work in different ways: ?Nicotine replacement therapy eases withdrawal and reduces your body's craving for nicotine, the main drug found in cigarettes. There are different forms of nicotine replacement, including skin patches, lozenges, gum, nasal sprays, and "puffers" or inhalers. Many can be bought without a prescription, while others  might require one. ?Bupropion is a prescription medicine that reduces your desire to smoke. This medicine is sold under the brand names Zyban and Wellbutrin. It is also available in a generic version, which is cheaper than brand name medicines. ?Varenicline (brand names: Chantix, Champix) is a prescription medicine that reduces withdrawal symptoms and cigarette cravings. If you think you'd like to take varenicline and you have a history of depression, anxiety, or heart disease, discuss this with your doctor or nurse before taking the medicine. Varenicline can also increase the effects of alcohol in some people. It's a good idea to limit drinking while you're taking it, at least until you know how it affects you.  How does counseling work? Counseling can happen during formal office visits or just over the phone. A counselor can help you: ?Figure out what triggers your smoking and what to do instead ?Overcome cravings ?Figure out what went wrong when you tried to quit before  What works best? Studies show that people have the best luck at quitting if they take medicines to help them quit and work with a Social worker. It might also be helpful to combine nicotine replacement with one of the prescription medicines that help people quit. In some cases, it might even make sense to take bupropion and varenicline together.  What about e-cigarettes? Sometimes people wonder if using electronic cigarettes, or "e-cigarettes," might help them quit smoking. Using e-cigarettes is also called "vaping." Doctors do not recommend e-cigarettes in place of medicines and counseling. That's because e-cigarettes still contain nicotine as well as other substances that might be harmful. It's not clear how they can affect a person's health in the long term.  Will I gain weight if I quit? Yes, you might gain a few pounds. But quitting smoking will have a much more positive effect on your health than weighing a few pounds more.  Plus, you can help prevent some weight gain by being more active and eating less. Taking the medicine bupropion might help control weight gain.   What else can I do to improve my chances of quitting? You can: ?Start exercising. ?Stay away from smokers and places that you associate with smoking. If people close to you smoke, ask them to quit with you. ?Keep gum, hard candy, or something to put  in your mouth handy. If you get a craving for a cigarette, try one of these instead. ?Don't give up, even if you start smoking again. It takes most people a few tries before they succeed.  What if I am pregnant and I smoke? If you are pregnant, it's really important for the health of your baby that you quit. Ask your doctor what options you have, and what is safest for your baby

## 2021-10-14 NOTE — Telephone Encounter (Signed)
Please do PA for Chantix, patient has already failed Welbutrin.  Thank you.

## 2021-10-14 NOTE — Telephone Encounter (Signed)
Received notification from [employer] regarding a prior authorization for Varenicline 0.5mg . Authorization has been APPROVED from 10/14/21 to 10/15/22.   Per test claim, copay for 7 days supply is $0.00  Per test claim, continuing dose of 1mg  tablets is also covered for $0.00

## 2021-10-14 NOTE — Progress Notes (Signed)
Jill Glenn    [id number]    1965-04-23  Primary Care Physician:Patient, No Pcp Per  Referring Physician: No referring provider defined for this encounter. Reason for Consultation: dyspnea, emphysema Date of Consultation: 10/14/2021  Chief complaint:   Chief Complaint  Patient presents with   Consult    Dramatic change in CT scan from the initial scan.  More sob with exertion in the past 6 months.  Dry cough mostly in am.  Had mild asthma all her life.     HPI: Jill Glenn is a 56 y.o. with tobaco use disorder who presents for new patient evaluation for dyspnea on exertion. Notes worsening over the last few months. She has to pause during ADLs to catch her breath. Has dyspnea with getting ready.   Has early morning cough and trouble bringing up sputum. Has chest tightness and wheezing. Had been prescribed a trelegy inhaler - but it was expensive with her old insurance. She has new insurance now.   Has been hospitalized for pneumonia in the past when she was a kid. Multiple times as an adult. She was hospitalized at [facility] Jan 2022 and in October 2022 for pneumonia/copd exacerbation.   No prednisone since Jan 2023. Uses rescue inhaler 1-2 times/day.  Last used trelegy in April 2023 due to cost.   Has been on wellbutrin recently and failed that and deeply desires to quit smoking  Social history:  Occupation: works as Clinical cytogeneticist.  Exposures: lives at home with husband who does not smoke.  Smoking history: started smoking at 37, had passive smoke exposure. 1 ppd, sometimes up to 1.5 ppd. Currently down to 0.25 ppd.    Social History   Occupational History   Not on file  Tobacco Use   Smoking status: Some Days    Packs/day: 1.00    Years: 40.00    Total pack years: 40.00    Types: Cigarettes   Smokeless tobacco: Never   Tobacco comments:    Smokes with other people.  Trying to quit.  Smokes a pack in 4 days.  10/14/2021 hfb  Substance and  Sexual Activity   Alcohol use: Not Currently    Comment: rarely    Drug use: No   Sexual activity: Yes    Partners: Male    Relevant family history:  Family History  Problem Relation Age of Onset   COPD Mother    Lung cancer Mother     Past Medical History:  Diagnosis Date   Asthma     Past Surgical History:  Procedure Laterality Date   ABDOMINAL HYSTERECTOMY     BREAST EXCISIONAL BIOPSY     TONSILLECTOMY       Physical Exam: Blood pressure 110/60, pulse 81, temperature 98.4 F (36.9 C), temperature source Oral, height 5\' 3"  (1.6 m), weight 144 lb 12.8 oz (65.7 kg), SpO2 94 %. Gen:      No acute distress ENT:  no nasal polyps, mucus membranes moist Lungs:    No increased respiratory effort, symmetric chest wall excursion, clear to auscultation bilaterally, diminished, no wheezes or crackles CV:         Regular rate and rhythm; no murmurs, rubs, or gallops.  No pedal edema Abd:      + bowel sounds; soft, non-tender; no distension MSK: no acute synovitis of DIP or PIP joints, no mechanics hands.  Skin:      Warm and dry; no rashes Neuro:  normal speech, no focal facial asymmetry Psych: alert and oriented x3, normal mood and affect   Data Reviewed/Medical Decision Making:  Independent interpretation of tests: Imaging:  Review of patient's CT CHest images 09/2021 revealed severe emphysema. The patient's images have been independently reviewed by me.    PFTs:  Labs:  Lab Results  Component Value Date   WBC 8.3 11/02/2020   HGB 14.6 11/02/2020   HCT 43.0 11/02/2020   MCV 90.2 11/02/2020   PLT 338 11/02/2020   Lab Results  Component Value Date   NA 137 11/03/2020   K 4.1 11/03/2020   CL 105 11/03/2020   CO2 24 11/03/2020     Immunization status:  Immunization History  Administered Date(s) Administered   Influenza,inj,Quad PF,6+ Mos 12/21/2020   PNEUMOCOCCAL CONJUGATE-20 12/21/2020   Tdap 03/22/2021   Zoster Recombinat (Shingrix) 03/22/2021     I  reviewed prior external note(s) from pcp, hospital stay, lung cancer screening  I reviewed the result(s) of the labs and imaging as noted above.   I have ordered pft  Assessment:  COPD with emphysema Tobacco use disorder Need for lung cancer screening  Plan/Recommendations:  Ordering full PFT Start chantix for smoking cessation Start breztri 2 puffs twice a day, gargle after use.  Continue albuterol inhaler as needed.  Needs repeat CT scan in sept 2024 for lung cancer screening.   Has been on wellbutrin recently and failed that and deeply desires to quit smoking. Will need insurance approval for chantix which is a medically necessary medication to prevent further morbidity and mortality from smoking in this highly motivated patient. Failure to approve this medication would be medically negligent and cause patient harm.     Smoking Cessation Counseling:  1. The patient is an everyday smoker and symptomatic due to the following condition copd 2. The patient is currently contemplative in quitting smoking. 3. I advised patient to quit smoking. 4. We identified patient specific barriers to change.  5. I personally spent 3 minutes counseling the patient regarding tobacco use disorder. 6. We discussed management of stress and anxiety to help with smoking cessation, when applicable. 7. We discussed nicotine replacement therapy, Wellbutrin, Chantix as possible options. 8. I advised setting a quit date. 9. Follow?up arranged with our office to continue ongoing discussions. 10.Resources given to patient including quit hotline.   We discussed disease management and progression at length today.    Return to Care: Return in about 4 weeks (around 11/11/2021).  Lenice Llamas, MD Pulmonary and [place]  CC: No ref. provider found

## 2021-10-14 NOTE — Telephone Encounter (Signed)
PA started in [facility] per request by provider for Varenicline Tartrate 0.5MG  tablets  PA sent to [place].  Awaiting determination to do additional test claims to see if a PA is also needed for subsequent 1mg  tablets for continuing therapy.  Key: [id number] - PA Case ID: [id number]

## 2021-10-15 ENCOUNTER — Ambulatory Visit (INDEPENDENT_AMBULATORY_CARE_PROVIDER_SITE_OTHER): Payer: 59 | Admitting: Internal Medicine

## 2021-10-15 DIAGNOSIS — J439 Emphysema, unspecified: Secondary | ICD-10-CM

## 2021-10-15 DIAGNOSIS — J4489 Other specified chronic obstructive pulmonary disease: Secondary | ICD-10-CM | POA: Diagnosis not present

## 2021-10-15 LAB — PULMONARY FUNCTION TEST
DL/VA % pred: 67 %
DL/VA: 2.91 ml/min/mmHg/L
DLCO cor % pred: 58 %
DLCO cor: 11.71 ml/min/mmHg
DLCO unc % pred: 58 %
DLCO unc: 11.71 ml/min/mmHg
FEF 25-75 Post: 0.47 L/sec
FEF 25-75 Pre: 0.28 L/sec
FEF2575-%Change-Post: 64 %
FEF2575-%Pred-Post: 18 %
FEF2575-%Pred-Pre: 11 %
FEV1-%Change-Post: 24 %
FEV1-%Pred-Post: 30 %
FEV1-%Pred-Pre: 24 %
FEV1-Post: 0.79 L
FEV1-Pre: 0.64 L
FEV1FVC-%Change-Post: 2 %
FEV1FVC-%Pred-Pre: 50 %
FEV6-%Change-Post: 24 %
FEV6-%Pred-Post: 59 %
FEV6-%Pred-Pre: 48 %
FEV6-Post: 1.92 L
FEV6-Pre: 1.54 L
FEV6FVC-%Change-Post: 2 %
FEV6FVC-%Pred-Post: 103 %
FEV6FVC-%Pred-Pre: 100 %
FVC-%Change-Post: 21 %
FVC-%Pred-Post: 58 %
FVC-%Pred-Pre: 47 %
FVC-Post: 1.92 L
FVC-Pre: 1.58 L
Post FEV1/FVC ratio: 41 %
Post FEV6/FVC ratio: 100 %
Pre FEV1/FVC ratio: 40 %
Pre FEV6/FVC Ratio: 98 %
RV % pred: 253 %
RV: 4.66 L
TLC % pred: 134 %
TLC: 6.59 L

## 2021-10-15 NOTE — Patient Instructions (Signed)
Full PFT performed today. °

## 2021-10-15 NOTE — Progress Notes (Unsigned)
Full PFT performed today. °

## 2021-10-19 NOTE — Telephone Encounter (Signed)
Called and spoke with patient, advised her that we received confirmation from our pharmacy team that the generic for chantix has been approved by her insurance and the copay will be $0.  She verified understanding.  Nothing further needed.

## 2021-11-04 ENCOUNTER — Ambulatory Visit (INDEPENDENT_AMBULATORY_CARE_PROVIDER_SITE_OTHER): Payer: 59 | Admitting: Nurse Practitioner

## 2021-11-04 ENCOUNTER — Encounter: Payer: Self-pay | Admitting: Nurse Practitioner

## 2021-11-04 VITALS — BP 132/82 | HR 67 | Ht 63.0 in | Wt 151.8 lb

## 2021-11-04 DIAGNOSIS — J449 Chronic obstructive pulmonary disease, unspecified: Secondary | ICD-10-CM

## 2021-11-04 DIAGNOSIS — F17200 Nicotine dependence, unspecified, uncomplicated: Secondary | ICD-10-CM | POA: Diagnosis not present

## 2021-11-04 MED ORDER — ALBUTEROL SULFATE HFA 108 (90 BASE) MCG/ACT IN AERS: 2.0000 | INHALATION_SPRAY | Freq: Four times a day (QID) | RESPIRATORY_TRACT | 3 refills | Status: DC | PRN

## 2021-11-04 NOTE — Assessment & Plan Note (Signed)
Very severe obstructive lung disease; possible asthma overlap with 24% change in FEV1. She has had improvement with Judithann Sauger; we will continue this. We reviewed disease state and progression of disease. Verbalized understanding. She struggles with ADLs; has had some improvement with smoking cessation. Discussed referral to pulmonary rehab, which she was agreeable to. Encouraged her to work on graded exercises, as tolerated.  Patient Instructions  Continue Albuterol inhaler 2 puffs every 6 hours as needed for shortness of breath or wheezing. Notify if symptoms persist despite rescue inhaler/neb use. Continue Breztri 2 puffs Twice daily. Brush tongue and rinse mouth afterwards  Continue Chantix 1 mg Twice daily for at least the next 10 weeks to complete 12 weeks of therapy   Great job on quitting smoking!!  Referral to pulmonary rehab placed today  Follow up in 3 months with Dr. [name]. If symptoms worsen, please contact office for sooner follow up or seek emergency care.

## 2021-11-04 NOTE — Progress Notes (Signed)
@Patient  ID: , female    DOB: [birth year], 56 y.o.   MRN: [id number]  Chief Complaint  Patient presents with   Follow-up    Pt f/u to discuss PFT results, she reports her breathing has been okay using Breztri and albuterol.     Referring provider: No ref. provider found  HPI: 56 year old female, active smoker followed for COPD with chronic bronchitis and emphysema. She is a patient of Dr. 53 and last seen in office on 10/14/2021. Past medical history significant for chronic cough.  TEST/EVENTS:  09/27/2021 lung cancer screen CT: no LAD. There is biapical pleuroparenchymal scarring. Centrilobular and bullous paraseptal emphysema. Calcified granulomas. There is a 7.7 mm LLL nodule, unchanged. Lung RADS 2. Low attenuation lesion in the hepatic lobe; too small to characterize but likely benign  10/15/2021 PFTs: FVC 58, FEV1 24, ratio 41, TLC 134, DLCOcor 58. Positive BD response with 24% change.   10/14/2021: OV with Dr. 12/14/2021 for pulmonary consult. DOE; worse over the past few months. Has to pause during ADLs to catch her breath. Early morning cough; trouble producing sputum. She has chest tightness and wheezing. She was previously prescribed Trelegy but it was expensive with old insurance. She's been hospitalized numerous times for pneumonia/COPD exacerbation; most recently January 2022 and October 2022. No prednisone since Jan 2023. Uses rescue 1-2 times a day. Continues to smoke; tried Wellbutrin in past. Started on [place]. Chantix for smoking cessation. Ordered full PFTs. Needs repeat CT in 09/2022 for lung cancer screening.   11/04/2021: Today - follow up Patient presents today with her husband for follow up after PFTs which were consistent with very severe obstruction with reversibility. She has been feeling better since she was here last. She quit smoking on 10/12, after she was here. Feels like the 12/12 has helped her; she doesn't have much chest tightness or  wheezing. Activity is still limited by her shortness of breath but she has noticed that she can walk further now that she's stopped smoking. She has an occasional cough in AM. No increased sputum production, fevers, chills, hemoptysis, weight loss. She rarely uses albuterol. She is still taking Chantix; unsure if she has refills to continue  Allergies  Allergen Reactions   Codeine     Immunization History  Administered Date(s) Administered   Influenza,inj,Quad PF,6+ Mos 12/21/2020, 10/14/2021   PNEUMOCOCCAL CONJUGATE-20 12/21/2020   Tdap 03/22/2021   Zoster Recombinat (Shingrix) 03/22/2021    Past Medical History:  Diagnosis Date   Asthma     Tobacco History: Social History   Tobacco Use  Smoking Status Former   Packs/day: 1.00   Years: 40.00   Total pack years: 40.00   Types: Cigarettes   Start date: 03/05/1981   Quit date: 10/14/2021   Years since quitting: 0.0  Smokeless Tobacco Never   Counseling given: Not Answered   Outpatient Medications Prior to Visit  Medication Sig Dispense Refill   acetaminophen (TYLENOL) 325 MG tablet Take 2 tablets (650 mg total) by mouth every 6 (six) hours as needed for moderate pain, mild pain or headache.     Budeson-Glycopyrrol-Formoterol (BREZTRI AEROSPHERE) 160-9-4.8 MCG/ACT AERO Inhale 2 puffs into the lungs in the morning and at bedtime. 1 each 5   Cranberry 500 MG TABS Take 1 tablet by mouth daily as needed (uti prevention).     guaiFENesin (MUCINEX) 600 MG 12 hr tablet Take 600 mg by mouth 2 (two) times daily as needed for cough.  Multiple Vitamin (MULTIVITAMIN ADULT) TABS Take 1 tablet by mouth daily.     varenicline (CHANTIX CONTINUING MONTH PAK) 1 MG tablet Take 1 tablet (1 mg total) by mouth 2 (two) times daily. 60 tablet 5   vitamin C (ASCORBIC ACID) 500 MG tablet Take 500 mg by mouth daily.     albuterol (VENTOLIN HFA) 108 (90 Base) MCG/ACT inhaler Inhale 2 puffs into the lungs every 6 (six) hours as needed for wheezing or  shortness of breath. 18 g 0   varenicline (CHANTIX) 0.5 MG tablet Take 1 tablet (0.5 mg total) by mouth 2 (two) times daily. 14 tablet 0   No facility-administered medications prior to visit.     Review of Systems:   Constitutional: No weight loss or gain, night sweats, fevers, chills, fatigue, or lassitude. HEENT: No headaches, difficulty swallowing, tooth/dental problems, or sore throat. No sneezing, itching, ear ache, nasal congestion, or post nasal drip CV:  No chest pain, orthopnea, PND, swelling in lower extremities, anasarca, dizziness, palpitations, syncope Resp: +shortness of breath with exertion; occasional AM cough; chest congestion (improved). No excess mucus or change in color of mucus. No hemoptysis. No wheezing.  No chest wall deformity GI:  No heartburn, indigestion, abdominal pain, nausea, vomiting, diarrhea, change in bowel habits, loss of appetite, bloody stools.  MSK:  No joint pain or swelling.  No decreased range of motion.  No back pain. Neuro: No dizziness or lightheadedness.  Psych: No depression or anxiety. Mood stable.     Physical Exam:  BP 132/82   Pulse 67   Ht 5\' 3"  (1.6 m)   Wt 151 lb 12.8 oz (68.9 kg)   SpO2 97%   BMI 26.89 kg/m   GEN: Pleasant, interactive, well-appearing; in no acute distress. HEENT:  Normocephalic and atraumatic. PERRLA. Sclera white. Nasal turbinates pink, moist and patent bilaterally. No rhinorrhea present. Oropharynx pink and moist, without exudate or edema. No lesions, ulcerations, or postnasal drip.  NECK:  Supple w/ fair ROM. No JVD present. Normal carotid impulses w/o bruits. Thyroid symmetrical with no goiter or nodules palpated. No lymphadenopathy.   CV: RRR, no m/r/g, no peripheral edema. Pulses intact, +2 bilaterally. No cyanosis, pallor or clubbing. PULMONARY:  Unlabored, regular breathing. Diminished bilaterally A&P w/o wheezes/rales/rhonchi. No accessory muscle use.  GI: BS present and normoactive. Soft, non-tender  to palpation. No organomegaly or masses detected. MSK: No erythema, warmth or tenderness. Cap refil <2 sec all extrem. No deformities or joint swelling noted.  Neuro: A/Ox3. No focal deficits noted.   Skin: Warm, no lesions or rashe Psych: Normal affect and behavior. Judgement and thought content appropriate.     Lab Results:  CBC    Component Value Date/Time   WBC 8.3 11/02/2020 0750   RBC 4.81 11/02/2020 0750   HGB 14.6 11/02/2020 1246   HCT 43.0 11/02/2020 1246   PLT 338 11/02/2020 0750   MCV 90.2 11/02/2020 0750   MCH 29.5 11/02/2020 0750   MCHC 32.7 11/02/2020 0750   RDW 13.4 11/02/2020 0750   LYMPHSABS 2.2 11/02/2020 0750   MONOABS 0.3 11/02/2020 0750   EOSABS 0.2 11/02/2020 0750   BASOSABS 0.1 11/02/2020 0750    BMET    Component Value Date/Time   NA 137 11/03/2020 0517   K 4.1 11/03/2020 0517   CL 105 11/03/2020 0517   CO2 24 11/03/2020 0517   GLUCOSE 94 11/03/2020 0517   BUN 10 11/03/2020 0517   CREATININE 0.71 11/03/2020 0517   CALCIUM 9.4 11/03/2020  0517   GFRNONAA >60 11/03/2020 0517    BNP    Component Value Date/Time   BNP 14.0 11/02/2020 0750     Imaging:  No results found.       Latest Ref Rng & Units 10/15/2021    8:50 AM  PFT Results  FVC-Pre L 1.58   FVC-Predicted Pre % 47   FVC-Post L 1.92   FVC-Predicted Post % 58   Pre FEV1/FVC % % 40   Post FEV1/FCV % % 41   FEV1-Pre L 0.64   FEV1-Predicted Pre % 24   FEV1-Post L 0.79   DLCO uncorrected ml/min/mmHg 11.71   DLCO UNC% % 58   DLCO corrected ml/min/mmHg 11.71   DLCO COR %Predicted % 58   DLVA Predicted % 67   TLC L 6.59   TLC % Predicted % 134   RV % Predicted % 253     No results found for: "NITRICOXIDE"      Assessment & Plan:   COPD, very severe (HCC) Very severe obstructive lung disease; possible asthma overlap with 24% change in FEV1. She has had improvement with Markus Daft; we will continue this. We reviewed disease state and progression of disease.  Verbalized understanding. She struggles with ADLs; has had some improvement with smoking cessation. Discussed referral to pulmonary rehab, which she was agreeable to. Encouraged her to work on graded exercises, as tolerated.  Patient Instructions  Continue Albuterol inhaler 2 puffs every 6 hours as needed for shortness of breath or wheezing. Notify if symptoms persist despite rescue inhaler/neb use. Continue Breztri 2 puffs Twice daily. Brush tongue and rinse mouth afterwards  Continue Chantix 1 mg Twice daily for at least the next 10 weeks to complete 12 weeks of therapy   Great job on quitting smoking!!  Referral to pulmonary rehab placed today  Follow up in 3 months with Dr. [name]. If symptoms worsen, please contact office for sooner follow up or seek emergency care.    Tobacco use disorder 40 pack year history. Quit smoking 10/14/2021 using Chantix. We will continue her on maintenance pack for at least 10 more weeks to accomplish 12 weeks of therapy. She has refills on current rx and will call pharmacy to fill these. Encouraged to remain smoke free.  Lung cancer screening CT 09/2022.    I spent 32 minutes of dedicated to the care of this patient on the date of this encounter to include pre-visit review of records, face-to-face time with the patient discussing conditions above, post visit ordering of testing, clinical documentation with the electronic health record, making appropriate referrals as documented, and communicating necessary findings to members of the patients care team.  Noemi Chapel, NP 11/04/2021  Pt aware and understands NP's role.

## 2021-11-04 NOTE — Patient Instructions (Addendum)
Continue Albuterol inhaler 2 puffs every 6 hours as needed for shortness of breath or wheezing. Notify if symptoms persist despite rescue inhaler/neb use. Continue Breztri 2 puffs Twice daily. Brush tongue and rinse mouth afterwards  Continue Chantix 1 mg Twice daily for at least the next 10 weeks to complete 12 weeks of therapy   Great job on quitting smoking!!  Referral to pulmonary rehab placed today  Follow up in 3 months with Dr. [name]. If symptoms worsen, please contact office for sooner follow up or seek emergency care.

## 2021-11-04 NOTE — Assessment & Plan Note (Signed)
40 pack year history. Quit smoking 10/14/2021 using Chantix. We will continue her on maintenance pack for at least 10 more weeks to accomplish 12 weeks of therapy. She has refills on current rx and will call pharmacy to fill these. Encouraged to remain smoke free.  Lung cancer screening CT 09/2022.

## 2021-11-11 ENCOUNTER — Telehealth (HOSPITAL_COMMUNITY): Payer: Self-pay

## 2021-11-11 NOTE — Telephone Encounter (Signed)
Called and spoke with pt in regards to PR, pt stated she was interested in PR. Explained scheduling process, patient verbalized understanding.

## 2021-11-13 ENCOUNTER — Other Ambulatory Visit: Payer: Self-pay | Admitting: Internal Medicine

## 2021-11-15 NOTE — Telephone Encounter (Signed)
Please advise on refill request

## 2021-11-15 NOTE — Telephone Encounter (Signed)
Doesn't need starter pack refill just needs to fill the 1mg  BID Rx already sent

## 2021-11-19 ENCOUNTER — Telehealth (HOSPITAL_COMMUNITY): Payer: Self-pay

## 2021-11-19 NOTE — Telephone Encounter (Signed)
Pt insurance is active and benefits verified through [employer], DED $3,500/$1,962.08 met, out of pocket $5,950/$1,962.08 met, co-insurance 0%. no pre-authorization required, [employer] 11/19/2021_0 :23am, REF# [id number]   Pt responsible for full amount of program until DED. Is met. No Josem [place] is required per the Automated system.

## 2021-11-22 ENCOUNTER — Encounter (HOSPITAL_COMMUNITY)
Admission: RE | Admit: 2021-11-22 | Discharge: 2021-11-22 | Disposition: A | Discharge status: Home / Self Care | Payer: 59 | Source: Ambulatory Visit | Attending: Internal Medicine | Admitting: Internal Medicine

## 2021-11-22 ENCOUNTER — Encounter (HOSPITAL_COMMUNITY): Payer: Self-pay

## 2021-11-22 VITALS — BP 104/70 | HR 70 | Ht 63.0 in | Wt 153.9 lb

## 2021-11-22 DIAGNOSIS — J449 Chronic obstructive pulmonary disease, unspecified: Secondary | ICD-10-CM | POA: Diagnosis not present

## 2021-11-22 NOTE — Progress Notes (Signed)
Jill Glenn 56 y.o. female  Initial Psychosocial Assessment  Pt psychosocial assessment reveals pt lives with their spouse. Pt is currently full time job doing Audiological scientist. Pt hobbies include  spending time with her animals . Pt reports her stress level is moderate. Areas of stress/anxiety include Health.  Pt does exhibit signs of depression. Signs of depression include helplessness and hopelessness and difficulty maintaining sleep. Pt shows good  coping skills with positive outlook . Offered emotional support and reassurance. Monitor and evaluate progress toward psychosocial goal(s).  Goal(s): Improved management of stress and depression Improved coping skills Help patient work toward returning to meaningful activities that improve patient's QOL and are attainable with patient's lung disease  Jill Glenn BS, ACSM-CEP 11/22/2021 3:42 PM

## 2021-11-22 NOTE — Progress Notes (Signed)
Pulmonary Individual Treatment Plan  Patient Details  Name: Jill Glenn MRN: [id number] Date of Birth: 03/30/1965 Referring Provider:   [name] Pulmonary Rehab Walk Test from 11/22/2021 in [facility] [place]  Referring Provider [name]       Initial Encounter Date:  Flowsheet Row Pulmonary Rehab Walk Test from 11/22/2021 in [facility] for [facility], [facility], & [place]  Date 11/22/21       Visit Diagnosis: Stage 3 severe COPD by GOLD classification ([place])  Patient's Home Medications on Admission:   Current Outpatient Medications:    acetaminophen (TYLENOL) 325 MG tablet, Take 2 tablets (650 mg total) by mouth every 6 (six) hours as needed for moderate pain, mild pain or headache., Disp: , Rfl:    albuterol (VENTOLIN HFA) 108 (90 Base) MCG/ACT inhaler, Inhale 2 puffs into the lungs every 6 (six) hours as needed for wheezing or shortness of breath., Disp: 18 g, Rfl: 3   Budeson-Glycopyrrol-Formoterol (BREZTRI AEROSPHERE) 160-9-4.8 MCG/ACT AERO, Inhale 2 puffs into the lungs in the morning and at bedtime., Disp: 1 each, Rfl: 5   Cranberry 500 MG TABS, Take 1 tablet by mouth daily as needed (uti prevention)., Disp: , Rfl:    guaiFENesin (MUCINEX) 600 MG 12 hr tablet, Take 600 mg by mouth 2 (two) times daily as needed for cough., Disp: , Rfl:    Multiple Vitamin (MULTIVITAMIN ADULT) TABS, Take 1 tablet by mouth daily., Disp: , Rfl:    varenicline (CHANTIX CONTINUING MONTH PAK) 1 MG tablet, Take 1 tablet (1 mg total) by mouth 2 (two) times daily., Disp: 60 tablet, Rfl: 5   vitamin C (ASCORBIC ACID) 500 MG tablet, Take 500 mg by mouth daily., Disp: , Rfl:   Past Medical History: Past Medical History:  Diagnosis Date   Asthma     Tobacco Use: Social History   Tobacco Use  Smoking Status Former   Packs/day: 1.00   Years: 40.00   Total pack years: 40.00   Types: Cigarettes   Start date:  03/05/1981   Quit date: 10/14/2021   Years since quitting: 0.1  Smokeless Tobacco Never    Labs: Review Flowsheet       Latest Ref Rng & Units 11/02/2020 12/21/2020  Labs for ITP Cardiac and Pulmonary Rehab  Cholestrol 0 - 200 mg/dL - 177   LDL (calc) 0 - 99 mg/dL - 91   HDL-C >39.00 mg/dL - 74.10   Trlycerides 0.0 - 149.0 mg/dL - 59.0   Bicarbonate 20.0 - 28.0 mmol/L 22.2  -  TCO2 22 - 32 mmol/L 23  -  Acid-base deficit 0.0 - 2.0 mmol/L 3.0  -  O2 Saturation % 90.0  -    Capillary Blood Glucose: No results found for: "GLUCAP"   Pulmonary Assessment Scores:  Pulmonary Assessment Scores     Row Name 11/22/21 1426         ADL UCSD   ADL Phase Entry     SOB Score total 45       CAT Score   CAT Score 24       mMRC Score   mMRC Score 2             UCSD: Self-administered rating of dyspnea associated with activities of daily living (ADLs) 6-point scale (0 = "not at all" to 5 = "maximal or unable to do because of breathlessness")  Scoring Scores range from 0 to 120.  Minimally  important difference is 5 units  CAT: CAT can identify the health impairment of COPD patients and is better correlated with disease progression.  CAT has a scoring range of zero to 40. The CAT score is classified into four groups of low (less than 10), medium (10 - 20), high (21-30) and very high (31-40) based on the impact level of disease on health status. A CAT score over 10 suggests significant symptoms.  A worsening CAT score could be explained by an exacerbation, poor medication adherence, poor inhaler technique, or progression of COPD or comorbid conditions.  CAT MCID is 2 points  mMRC: mMRC (Modified Medical Research Council) Dyspnea Scale is used to assess the degree of baseline functional disability in patients of respiratory disease due to dyspnea. No minimal important difference is established. A decrease in score of 1 point or greater is considered a positive change.   Pulmonary  Function Assessment:  Pulmonary Function Assessment - 11/22/21 1429       Breath   Bilateral Breath Sounds Decreased   diminished throughout   Shortness of Breath Yes;Limiting activity             Exercise Target Goals: Exercise Program Goal: Individual exercise prescription set using results from initial 6 min walk test and THRR while considering  patient's activity barriers and safety.   Exercise Prescription Goal: Initial exercise prescription builds to 30-45 minutes a day of aerobic activity, 2-3 days per week.  Home exercise guidelines will be given to patient during program as part of exercise prescription that the participant will acknowledge.  Activity Barriers & Risk Stratification:  Activity Barriers & Cardiac Risk Stratification - 11/22/21 1423       Activity Barriers & Cardiac Risk Stratification   Activity Barriers Deconditioning;Muscular Weakness;Shortness of Breath    Cardiac Risk Stratification Moderate             6 Minute Walk:  6 Minute Walk     Row Name 11/22/21 1526         6 Minute Walk   Phase Initial     Distance 1170 feet     Walk Time 6 minutes     # of Rest Breaks 0     MPH 2.22     METS 3.17     RPE 13     Perceived Dyspnea  2     VO2 Peak 11.09     Symptoms Yes (comment)     Comments SOB, practiced pursed lip breathing     Resting HR 70 bpm     Resting BP 104/70     Resting Oxygen Saturation  98 %     Exercise Oxygen Saturation  during 6 min walk 91 %     Max Ex. HR 80 bpm     Max Ex. BP 120/60     2 Minute Post BP 112/70       Interval HR   1 Minute HR 70     2 Minute HR 80     3 Minute HR 76     4 Minute HR 76     5 Minute HR 76     6 Minute HR 76     2 Minute Post HR 71     Interval Heart Rate? Yes       Interval Oxygen   Interval Oxygen? Yes     Baseline Oxygen Saturation % 98 %     1 Minute Oxygen Saturation % 95 %  1 Minute Liters of Oxygen 0 L     2 Minute Oxygen Saturation % 95 %     2 Minute Liters  of Oxygen 0 L     3 Minute Oxygen Saturation % 94 %     3 Minute Liters of Oxygen 0 L     4 Minute Oxygen Saturation % 91 %     4 Minute Liters of Oxygen 0 L     5 Minute Oxygen Saturation % 93 %     5 Minute Liters of Oxygen 0 L     6 Minute Oxygen Saturation % 93 %     6 Minute Liters of Oxygen 0 L     2 Minute Post Oxygen Saturation % 98 %     2 Minute Post Liters of Oxygen 0 L              Oxygen Initial Assessment:  Oxygen Initial Assessment - 11/22/21 1422       Home Oxygen   Home Oxygen Device None    Sleep Oxygen Prescription None    Home Exercise Oxygen Prescription None    Home Resting Oxygen Prescription None    Compliance with Home Oxygen Use Yes      Initial 6 min Walk   Oxygen Used None      Program Oxygen Prescription   Program Oxygen Prescription None      Intervention   Short Term Goals To learn and exhibit compliance with exercise, home and travel O2 prescription;To learn and understand importance of monitoring SPO2 with pulse oximeter and demonstrate accurate use of the pulse oximeter.;To learn and understand importance of maintaining oxygen saturations>88%;To learn and demonstrate proper pursed lip breathing techniques or other breathing techniques. ;To learn and demonstrate proper use of respiratory medications    Long  Term Goals Exhibits compliance with exercise, home  and travel O2 prescription;Verbalizes importance of monitoring SPO2 with pulse oximeter and return demonstration;Maintenance of O2 saturations>88%;Exhibits proper breathing techniques, such as pursed lip breathing or other method taught during program session;Compliance with respiratory medication;Demonstrates proper use of MDI's             Oxygen Re-Evaluation:   Oxygen Discharge (Final Oxygen Re-Evaluation):   Initial Exercise Prescription:  Initial Exercise Prescription - 11/22/21 1500       Date of Initial Exercise RX and Referring Provider   Date 11/22/21     Referring Provider [name]    Expected Discharge Date 01/27/22      Recumbant Bike   Level 1    RPM 70    Minutes 15      Recumbant Elliptical   Level 1    RPM 70    Minutes 15      Prescription Details   Frequency (times per week) 2    Duration Progress to 30 minutes of continuous aerobic without signs/symptoms of physical distress      Intensity   THRR 40-80% of Max Heartrate 66-132    Ratings of Perceived Exertion 11-13    Perceived Dyspnea 0-4      Progression   Progression Continue progressive overload as per policy without signs/symptoms or physical distress.      Resistance Training   Training Prescription Yes    Weight blue bands    Reps 10-15             Perform Capillary Blood Glucose checks as needed.  Exercise Prescription Changes:   Exercise Comments:   Exercise Goals  and Review:   Exercise Goals     Row Name 11/22/21 1519             Exercise Goals   Increase Physical Activity Yes       Intervention Provide advice, education, support and counseling about physical activity/exercise needs.;Develop an individualized exercise prescription for aerobic and resistive training based on initial evaluation findings, risk stratification, comorbidities and participant's personal goals.       Expected Outcomes Short Term: Attend rehab on a regular basis to increase amount of physical activity.;Long Term: Exercising regularly at least 3-5 days a week.;Long Term: Add in home exercise to make exercise part of routine and to increase amount of physical activity.       Increase Strength and Stamina Yes       Intervention Provide advice, education, support and counseling about physical activity/exercise needs.;Develop an individualized exercise prescription for aerobic and resistive training based on initial evaluation findings, risk stratification, comorbidities and participant's personal goals.       Expected Outcomes Short Term: Increase workloads from initial  exercise prescription for resistance, speed, and METs.;Short Term: Perform resistance training exercises routinely during rehab and add in resistance training at home;Long Term: Improve cardiorespiratory fitness, muscular endurance and strength as measured by increased METs and functional capacity (6MWT)       Able to understand and use rate of perceived exertion (RPE) scale Yes       Intervention Provide education and explanation on how to use RPE scale       Expected Outcomes Short Term: Able to use RPE daily in rehab to express subjective intensity level;Long Term:  Able to use RPE to guide intensity level when exercising independently       Able to understand and use Dyspnea scale Yes       Intervention Provide education and explanation on how to use Dyspnea scale       Expected Outcomes Short Term: Able to use Dyspnea scale daily in rehab to express subjective sense of shortness of breath during exertion;Long Term: Able to use Dyspnea scale to guide intensity level when exercising independently       Knowledge and understanding of Target Heart Rate Range (THRR) Yes       Intervention Provide education and explanation of THRR including how the numbers were predicted and where they are located for reference       Expected Outcomes Short Term: Able to state/look up THRR;Long Term: Able to use THRR to govern intensity when exercising independently;Short Term: Able to use daily as guideline for intensity in rehab       Understanding of Exercise Prescription Yes       Intervention Provide education, explanation, and written materials on patient's individual exercise prescription       Expected Outcomes Short Term: Able to explain program exercise prescription;Long Term: Able to explain home exercise prescription to exercise independently                Exercise Goals Re-Evaluation :   Discharge Exercise Prescription (Final Exercise Prescription Changes):   Nutrition:  Target Goals:  Understanding of nutrition guidelines, daily intake of sodium <1579m, cholesterol <2052m calories 30% from fat and 7% or less from saturated fats, daily to have 5 or more servings of fruits and vegetables.  Biometrics:  Pre Biometrics - 11/22/21 1529       Pre Biometrics   Grip Strength 26 kg  Nutrition Therapy Plan and Nutrition Goals:   Nutrition Assessments:  MEDIFICTS Score Key: ?70 Need to make dietary changes  40-70 Heart Healthy Diet ? 40 Therapeutic Level Cholesterol Diet   Picture Your Plate Scores: <76 Unhealthy dietary pattern with much room for improvement. 41-50 Dietary pattern unlikely to meet recommendations for good health and room for improvement. 51-60 More healthful dietary pattern, with some room for improvement.  >60 Healthy dietary pattern, although there may be some specific behaviors that could be improved.    Nutrition Goals Re-Evaluation:   Nutrition Goals Discharge (Final Nutrition Goals Re-Evaluation):   Psychosocial: Target Goals: Acknowledge presence or absence of significant depression and/or stress, maximize coping skills, provide positive support system. Participant is able to verbalize types and ability to use techniques and skills needed for reducing stress and depression.  Initial Review & Psychosocial Screening:  Initial Psych Review & Screening - 11/22/21 1405       Initial Review   Current issues with Current Depression      Family Dynamics   Good Support System? Yes    Comments Husband, Brigid Re, 2 daughters      Barriers   Psychosocial barriers to participate in program The patient should benefit from training in stress management and relaxation.      Screening Interventions   Interventions Encouraged to exercise    Expected Outcomes Short Term goal: Utilizing psychosocial counselor, staff and physician to assist with identification of specific Stressors or current issues interfering with healing  process. Setting desired goal for each stressor or current issue identified.;Long Term Goal: Stressors or current issues are controlled or eliminated.;Short Term goal: Identification and review with participant of any Quality of Life or Depression concerns found by scoring the questionnaire.;Long Term goal: The participant improves quality of Life and PHQ9 Scores as seen by post scores and/or verbalization of changes             Quality of Life Scores:  Scores of 19 and below usually indicate a poorer quality of life in these areas.  A difference of  2-3 points is a clinically meaningful difference.  A difference of 2-3 points in the total score of the Quality of Life Index has been associated with significant improvement in overall quality of life, self-image, physical symptoms, and general health in studies assessing change in quality of life.  PHQ-9: Review Flowsheet       11/22/2021 03/22/2021 12/21/2020  Depression screen PHQ 2/9  Decreased Interest 1 1 0  Down, Depressed, Hopeless _0 PHQ - 2 Score _1 Altered sleeping 2 3 -  Tired, decreased energy 1 3 -  Change in appetite 2 1 -  Feeling bad or failure about yourself  3 0 -  Trouble concentrating 0 0 -  Moving slowly or fidgety/restless 1 0 -  Suicidal thoughts 0 0 -  PHQ-9 Score 11 9 -  Difficult doing work/chores Somewhat difficult - -   Interpretation of Total Score  Total Score Depression Severity:  1-4 = Minimal depression, 5-9 = Mild depression, 10-14 = Moderate depression, 15-19 = Moderately severe depression, 20-27 = Severe depression   Psychosocial Evaluation and Intervention:  Psychosocial Evaluation - 11/22/21 1415       Psychosocial Evaluation & Interventions   Interventions Stress management education;Relaxation education;Encouraged to exercise with the program and follow exercise prescription    Comments Kaleisha will benefit from exercise    Expected Outcomes For pt to participate in PR  Continue  Psychosocial Services  Follow up required by staff             Psychosocial Re-Evaluation:   Psychosocial Discharge (Final Psychosocial Re-Evaluation):   Education: Education Goals: Education classes will be provided on a weekly basis, covering required topics. Participant will state understanding/return demonstration of topics presented.  Learning Barriers/Preferences:  Learning Barriers/Preferences - 11/22/21 1415       Learning Barriers/Preferences   Learning Barriers Sight    Learning Preferences Audio;Group Instruction;Individual Instruction;Verbal Instruction;Video;Written Material             Education Topics: Introduction to Pulmonary Rehab Group instruction provided by PowerPoint, verbal discussion, and written material to support subject matter. Instructor reviews what Pulmonary Rehab is, the purpose of the program, and how patients are referred.     Know Your Numbers Group instruction that is supported by a PowerPoint presentation. Instructor discusses importance of knowing and understanding resting, exercise, and post-exercise oxygen saturation, heart rate, and blood pressure. Oxygen saturation, heart rate, blood pressure, rating of perceived exertion, and dyspnea are reviewed along with a normal range for these values.    Exercise for the Pulmonary Patient Group instruction that is supported by a PowerPoint presentation. Instructor discusses benefits of exercise, core components of exercise, frequency, duration, and intensity of an exercise routine, importance of utilizing pulse oximetry during exercise, safety while exercising, and options of places to exercise outside of rehab.       MET Level  Group instruction provided by PowerPoint, verbal discussion, and written material to support subject matter. Instructor reviews what METs are and how to increase METs.    Pulmonary Medications Verbally interactive group education provided by instructor with  focus on inhaled medications and proper administration.   Anatomy and Physiology of the Respiratory System Group instruction provided by PowerPoint, verbal discussion, and written material to support subject matter. Instructor reviews respiratory cycle and anatomical components of the respiratory system and their functions. Instructor also reviews differences in obstructive and restrictive respiratory diseases with examples of each.    Oxygen Safety Group instruction provided by PowerPoint, verbal discussion, and written material to support subject matter. There is an overview of "What is Oxygen" and "Why do we need it".  Instructor also reviews how to create a safe environment for oxygen use, the importance of using oxygen as prescribed, and the risks of noncompliance. There is a brief discussion on traveling with oxygen and resources the patient may utilize.   Oxygen Use Group instruction provided by PowerPoint, verbal discussion, and written material to discuss how supplemental oxygen is prescribed and different types of oxygen supply systems. Resources for more information are provided.    Breathing Techniques Group instruction that is supported by demonstration and informational handouts. Instructor discusses the benefits of pursed lip and diaphragmatic breathing and detailed demonstration on how to perform both.     Risk Factor Reduction Group instruction that is supported by a PowerPoint presentation. Instructor discusses the definition of a risk factor, different risk factors for pulmonary disease, and how the heart and lungs work together.   MD Day A group question and answer session with a medical doctor that allows participants to ask questions that relate to their pulmonary disease state.   Nutrition for the Pulmonary Patient Group instruction provided by PowerPoint slides, verbal discussion, and written materials to support subject matter. The instructor gives an explanation  and review of healthy diet recommendations, which includes a discussion on weight management, recommendations for fruit and  vegetable consumption, as well as protein, fluid, caffeine, fiber, sodium, sugar, and alcohol. Tips for eating when patients are short of breath are discussed.    Other Education Group or individual verbal, written, or video instructions that support the educational goals of the pulmonary rehab program.    Knowledge Questionnaire Score:  Knowledge Questionnaire Score - 11/22/21 1416       Knowledge Questionnaire Score   Pre Score 17/18             Core Components/Risk Factors/Patient Goals at Admission:  Personal Goals and Risk Factors at Admission - 11/22/21 1419       Core Components/Risk Factors/Patient Goals on Admission    Weight Management Weight Loss    Tobacco Cessation Yes    Number of packs per day only occasional puff currently    Intervention Offer self-teaching materials, assist with locating and accessing local/national Quit Smoking programs, and support quit date choice.;Assist the participant in steps to quit. Provide individualized education and counseling about committing to Tobacco Cessation, relapse prevention, and pharmacological support that can be provided by physician.    Expected Outcomes Short Term: Will quit all tobacco product use, adhering to prevention of relapse plan.;Long Term: Complete abstinence from all tobacco products for at least 12 months from quit date.    Improve shortness of breath with ADL's Yes    Intervention Provide education, individualized exercise plan and daily activity instruction to help decrease symptoms of SOB with activities of daily living.    Expected Outcomes Short Term: Improve cardiorespiratory fitness to achieve a reduction of symptoms when performing ADLs;Long Term: Be able to perform more ADLs without symptoms or delay the onset of symptoms             Core Components/Risk Factors/Patient  Goals Review:    Core Components/Risk Factors/Patient Goals at Discharge (Final Review):    ITP Comments:   Comments: Dr. Rodman Pickle is Medical Director for Pulmonary Rehab at [facility].

## 2021-11-22 NOTE — Progress Notes (Signed)
Pulmonary Rehab Orientation Physical Assessment Note  Physical assessment reveals  Pt is alert and oriented x 3.  Heart rate is normal, breath sounds clear to auscultation, no wheezes, rales, or rhonchi however diminished throughout. Reports non-productive cough. Bowel sounds present.  Pt denies abdominal discomfort, nausea, vomiting or diarrhea. Grip strength equal, strong. Distal pulses palpable; no swelling to lower extremities. Khizar Fiorella RN, BSN Cardiac and Pulmonary Rehab Nurse Navigator '  

## 2021-11-22 NOTE — Progress Notes (Signed)
Jill Glenn 56 y.o. female Pulmonary Rehab Orientation Note This patient who was referred to Pulmonary Rehab by Dr. [name] with the diagnosis of COPD 3 arrived today in Cardiac and Pulmonary Rehab. She  arrived ambulatory with normal gait. She  does not carry portable oxygen.  Color good, skin warm and dry. Patient is oriented to time and place. Patient's medical history, psychosocial health, and medications reviewed. Psychosocial assessment reveals patient lives with spouse. 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 Patient hobbies include  spending time with her animals . Patient reports her stress level is moderate. Areas of stress/anxiety include health. Patient does exhibit signs of depression. Signs of depression include helplessness and hopelessness and difficulty maintaining sleep. PHQ2/9 score 2/11. Jill Glenn shows good  coping skills with positive outlook on life. Offered emotional support and reassurance. Will continue to monitor and evaluate progress toward psychosocial goal(s) of decreasing stress and depression related to illness. Physical assessment performed by Karlene Lineman RN. Please see their orientation physical assessment note. Jill Glenn reports she does take medications as prescribed. Patient states she follows a regular  diet. The patient reports no specific efforts to gain or lose weight.. Patient's weight will be monitored closely. Demonstration and practice of PLB using pulse oximeter. Jill Glenn able to return demonstration satisfactorily. Safety and hand hygiene in the exercise area reviewed with patient. Jill Glenn voices understanding of the information reviewed. Department expectations discussed with patient and achievable goals were set. The patient shows enthusiasm about attending the program and we look forward to working with Jill Glenn. Jill Glenn completed a 6 min walk test today and is scheduled to begin exercise on 11/30/21 at 1 pm.   [id number] Jill Masson, MS,  ACSM-CEP

## 2021-11-30 ENCOUNTER — Encounter (HOSPITAL_COMMUNITY)
Admission: RE | Admit: 2021-11-30 | Discharge: 2021-11-30 | Disposition: A | Discharge status: Home / Self Care | Payer: 59 | Source: Ambulatory Visit | Attending: Internal Medicine | Admitting: Internal Medicine

## 2021-11-30 DIAGNOSIS — J449 Chronic obstructive pulmonary disease, unspecified: Secondary | ICD-10-CM | POA: Diagnosis not present

## 2021-11-30 NOTE — Progress Notes (Signed)
Daily Session Note  Patient Details  Name: Jill Glenn MRN: [id number] Date of Birth: [birth year] Referring Provider:   [name] Pulmonary Rehab Walk Test from 11/22/2021 in [facility], [facility], & [place]  Referring Provider [name]       Encounter Date: 11/30/2021  Check In:  Session Check In - 11/30/21 [place]       Check-In   Supervising physician immediately available to respond to emergencies [facility] - Physician supervision    Physician(s) Mannam    Location [facility]    Staff Present Josephina Shih BS, ACSM-CEP, Exercise Physiologist;Other;Casondra Gasca Rosana Hoes, MS, ACSM-CEP, Exercise Physiologist;Samantha [place], RD, Sherrye Payor, RN    Virtual Visit No    Medication changes reported     No    Fall or balance concerns reported    No    Tobacco Cessation No Change    Warm-up and Cool-down Performed as group-led instruction    Resistance Training Performed Yes    VAD Patient? No    PAD/SET Patient? No      Pain Assessment   Currently in Pain? Yes   MD aware of pain and pain comes and goes   Pain Score 4     Pain Location Rib cage    Pain Orientation Left    Pain Descriptors / Indicators Aching    Pain Type Chronic pain    Pain Onset More than a month ago    Pain Frequency Intermittent    Multiple Pain Sites No             Capillary Blood Glucose: No results found for this or any previous visit (from the past 24 hour(s)).    Social History   Tobacco Use  Smoking Status Former   Packs/day: 1.00   Years: 40.00   Total pack years: 40.00   Types: Cigarettes   Start date: 03/05/1981   Quit date: 10/14/2021   Years since quitting: 0.1  Smokeless Tobacco Never    Goals Met:  Proper associated with RPD/PD & O2 Sat Exercise tolerated well Strength training completed today  Goals Unmet:  Not Applicable  Comments: Service time is from 1315 to 1450.    Dr. Rodman Pickle is Medical Director for  Pulmonary Rehab at [facility].

## 2021-12-02 ENCOUNTER — Encounter (HOSPITAL_COMMUNITY)
Admission: RE | Admit: 2021-12-02 | Discharge: 2021-12-02 | Disposition: A | Discharge status: Home / Self Care | Payer: 59 | Source: Ambulatory Visit | Attending: Internal Medicine | Admitting: Internal Medicine

## 2021-12-02 DIAGNOSIS — J449 Chronic obstructive pulmonary disease, unspecified: Secondary | ICD-10-CM

## 2021-12-02 NOTE — Progress Notes (Signed)
Daily Session Note  Patient Details  Name: Jill Glenn MRN: [id number] Date of Birth: 07-30-1965 Referring Provider:   [name] Pulmonary Rehab Walk Test from 11/22/2021 in [facility] for [facility], Vascular, & [employer]  Referring Provider [name]       Encounter Date: 12/02/2021  Check In:  Session Check In - 12/02/21 [place]       Check-In   Supervising physician immediately available to respond to emergencies [facility] - Physician supervision    Physician(s) [facility]    Location [facility]    Staff Present Rodney Langton, Cathleen Fears, MS, ACSM-CEP, Exercise Physiologist;Randi Yevonne Pax, ACSM-CEP, Exercise Physiologist;Other    Virtual Visit No    Medication changes reported     No    Fall or balance concerns reported    No    Tobacco Cessation No Change    Warm-up and Cool-down Performed as group-led instruction    Resistance Training Performed Yes    VAD Patient? No    PAD/SET Patient? No      Pain Assessment   Currently in Pain? No/denies    Pain Score 0-No pain    Multiple Pain Sites No             Capillary Blood Glucose: No results found for this or any previous visit (from the past 24 hour(s)).    Social History   Tobacco Use  Smoking Status Former   Packs/day: 1.00   Years: 40.00   Total pack years: 40.00   Types: Cigarettes   Start date: 03/05/1981   Quit date: 10/14/2021   Years since quitting: 0.1  Smokeless Tobacco Never    Goals Met:  Independence with exercise equipment Personal goals reviewed No report of concerns or symptoms today Strength training completed today  Goals Unmet:  Not Applicable  Comments: Service time is from 1318 to [place]    Dr. Rodman Pickle is Medical Director for Pulmonary Rehab at [facility].

## 2021-12-07 ENCOUNTER — Encounter (HOSPITAL_COMMUNITY)
Admission: RE | Admit: 2021-12-07 | Discharge: 2021-12-07 | Disposition: A | Discharge status: Home / Self Care | Payer: 59 | Source: Ambulatory Visit | Attending: Internal Medicine | Admitting: Internal Medicine

## 2021-12-07 DIAGNOSIS — J449 Chronic obstructive pulmonary disease, unspecified: Secondary | ICD-10-CM | POA: Diagnosis present

## 2021-12-09 ENCOUNTER — Encounter (HOSPITAL_COMMUNITY)
Admission: RE | Admit: 2021-12-09 | Discharge: 2021-12-09 | Disposition: A | Discharge status: Home / Self Care | Payer: 59 | Source: Ambulatory Visit | Attending: Internal Medicine | Admitting: Internal Medicine

## 2021-12-09 DIAGNOSIS — J449 Chronic obstructive pulmonary disease, unspecified: Secondary | ICD-10-CM

## 2021-12-09 NOTE — Progress Notes (Signed)
Daily Session Note  Patient Details  Name: Jill Glenn MRN: [id number] Date of Birth: 1965/07/17 Referring Provider:   [name] Pulmonary Glenn Walk Test from 11/22/2021 in [facility] for Heart, Vascular, & [place]  Referring Provider Jill Glenn       Encounter Date: 12/09/2021  Check In:  Session Check In - 12/09/21 1426       Check-In   Supervising physician immediately available to respond to emergencies Jill Glenn - Physician supervision    Physician(s) Jill Glenn    Location Jill Glenn    Staff Present Jill Else, MS, ACSM-CEP, Exercise Physiologist;Jill Glenn, ACSM-CEP, Exercise Physiologist;Other    Virtual Visit No    Medication changes reported     No    Fall or balance concerns reported    No    Tobacco Cessation No Change    Warm-up and Cool-down Performed as group-led instruction    Resistance Training Performed Yes    VAD Patient? No    PAD/SET Patient? No      Pain Assessment   Currently in Pain? No/denies    Pain Score 0-No pain    Multiple Pain Sites No             Capillary Blood Glucose: No results found for this or any previous visit (from the past 24 hour(s)).    Social History   Tobacco Use  Smoking Status Former   Packs/day: 1.00   Years: 40.00   Total pack years: 40.00   Types: Cigarettes   Start date: 03/05/1981   Quit date: 10/14/2021   Years since quitting: 0.1  Smokeless Tobacco Never    Goals Met:  Independence with exercise equipment Exercise tolerated well No report of concerns or symptoms today Strength training completed today  Goals Unmet:  Not Applicable  Comments: Service time is from 1322 to 1450    Dr. Rodman Glenn is Medical Director for Pulmonary Glenn at [facility].

## 2021-12-14 ENCOUNTER — Encounter (HOSPITAL_COMMUNITY)
Admission: RE | Admit: 2021-12-14 | Discharge: 2021-12-14 | Disposition: A | Discharge status: Home / Self Care | Payer: 59 | Source: Ambulatory Visit | Attending: Internal Medicine | Admitting: Internal Medicine

## 2021-12-14 DIAGNOSIS — J449 Chronic obstructive pulmonary disease, unspecified: Secondary | ICD-10-CM | POA: Diagnosis not present

## 2021-12-14 NOTE — Progress Notes (Signed)
Daily Session Note  Patient Details  Name: Jill Glenn MRN: [id number] Date of Birth: [birth year] Referring Provider:   [name] Pulmonary Rehab Walk Test from 11/22/2021 in [facility] for Heart, Vascular, & [place]  Referring Provider [name]       Encounter Date: 12/14/2021  Check In:  Session Check In - 12/14/21 1413       Check-In   Supervising physician immediately available to respond to emergencies [facility] - Physician supervision    Physician(s) Dr. Tacy Learn    [facility]    Staff Present Rodney Langton, RN;Randi Yevonne Pax, ACSM-CEP, Exercise Physiologist;Kaylee Rosana Hoes, MS, ACSM-CEP, Exercise Physiologist;Other    Virtual Visit No    Medication changes reported     No    Fall or balance concerns reported    No    Tobacco Cessation No Change    Warm-up and Cool-down Performed as group-led instruction    Resistance Training Performed Yes    VAD Patient? No    PAD/SET Patient? No      Pain Assessment   Currently in Pain? No/denies             Capillary Blood Glucose: No results found for this or any previous visit (from the past 24 hour(s)).    Social History   Tobacco Use  Smoking Status Former   Packs/day: 1.00   Years: 40.00   Total pack years: 40.00   Types: Cigarettes   Start date: 03/05/1981   Quit date: 10/14/2021   Years since quitting: 0.1  Smokeless Tobacco Never    Goals Met:  Independence with exercise equipment Exercise tolerated well No report of concerns or symptoms today Strength training completed today  Goals Unmet:  Not Applicable  Comments: Service time is from 1319 to 1442    Dr. Rodman Pickle is Medical Director for Pulmonary Rehab at [facility].

## 2021-12-16 ENCOUNTER — Encounter (HOSPITAL_COMMUNITY)
Admission: RE | Admit: 2021-12-16 | Discharge: 2021-12-16 | Disposition: A | Discharge status: Home / Self Care | Payer: 59 | Source: Ambulatory Visit | Attending: Internal Medicine | Admitting: Internal Medicine

## 2021-12-16 DIAGNOSIS — J449 Chronic obstructive pulmonary disease, unspecified: Secondary | ICD-10-CM | POA: Diagnosis not present

## 2021-12-16 NOTE — Progress Notes (Signed)
Daily Session Note  Patient Details  Name: Jill Glenn MRN: [id number] Date of Birth: [birth year] Referring Provider:   [name] Pulmonary Rehab Walk Test from 11/22/2021 in [facility] for Heart, Vascular, & [place]  Referring Provider [name]       Encounter Date: 12/16/2021  Check In:  Session Check In - 12/16/21 1522       Check-In   Supervising physician immediately available to respond to emergencies [facility] - Physician supervision    Physician(s) Dr. Tacy Learn    Location [facility]    Staff Present Elmon Else, MS, ACSM-CEP, Exercise Physiologist;Lisa Ysidro Evert, RN;Other;Randi Yevonne Pax, ACSM-CEP, Exercise Physiologist    Virtual Visit No    Medication changes reported     No    Fall or balance concerns reported    No    Tobacco Cessation No Change    Warm-up and Cool-down Performed as group-led instruction    Resistance Training Performed Yes    VAD Patient? No    PAD/SET Patient? No      Pain Assessment   Currently in Pain? No/denies             Capillary Blood Glucose: No results found for this or any previous visit (from the past 24 hour(s)).    Social History   Tobacco Use  Smoking Status Former   Packs/day: 1.00   Years: 40.00   Total pack years: 40.00   Types: Cigarettes   Start date: 03/05/1981   Quit date: 10/14/2021   Years since quitting: 0.1  Smokeless Tobacco Never    Goals Met:  Independence with exercise equipment Exercise tolerated well No report of concerns or symptoms today Strength training completed today  Goals Unmet:  Not Applicable  Comments: Service time is from 1323 to [place]    Dr. Rodman Pickle is Medical Director for Pulmonary Rehab at [facility].

## 2021-12-16 NOTE — Progress Notes (Signed)
Home Exercise Prescription I have reviewed a Home Exercise Prescription with Jill Glenn.  She is currently walking 30-60 min 3-4 days a week on top of coming to [place]. No change needed, encouraged pt to continue. The patient stated that their goals were to stay off oxygen. We reviewed exercise guidelines, target heart rate during exercise, RPE Scale, weather conditions, endpoints for exercise, warmup and cool down. The patient is encouraged to come to me with any questions. I will continue to follow up with the patient to assist them with progression and safety.    Duilio Heritage [place], [place], ACSM-CEP 12/16/2021 4:20 PM

## 2021-12-21 ENCOUNTER — Encounter (HOSPITAL_COMMUNITY)
Admission: RE | Admit: 2021-12-21 | Discharge: 2021-12-21 | Disposition: A | Discharge status: Home / Self Care | Payer: 59 | Source: Ambulatory Visit | Attending: Internal Medicine | Admitting: Internal Medicine

## 2021-12-21 VITALS — Wt 156.5 lb

## 2021-12-21 DIAGNOSIS — J449 Chronic obstructive pulmonary disease, unspecified: Secondary | ICD-10-CM

## 2021-12-22 NOTE — Progress Notes (Signed)
Pulmonary Individual Treatment Plan  Patient Details  Name: Jill Glenn MRN: [id number] Date of Birth: 03/30/1965 Referring Provider:   [name] Pulmonary Rehab Walk Test from 11/22/2021 in [facility] [place]  Referring Provider [name]       Initial Encounter Date:  Flowsheet Row Pulmonary Rehab Walk Test from 11/22/2021 in [facility], & [place]  Date 11/22/21       Visit Diagnosis: Stage 3 severe COPD by GOLD classification ([place])  Patient's Home Medications on Admission:   Current Outpatient Medications:    acetaminophen (TYLENOL) 325 MG tablet, Take 2 tablets (650 mg total) by mouth every 6 (six) hours as needed for moderate pain, mild pain or headache., Disp: , Rfl:    albuterol (VENTOLIN HFA) 108 (90 Base) MCG/ACT inhaler, Inhale 2 puffs into the lungs every 6 (six) hours as needed for wheezing or shortness of breath., Disp: 18 g, Rfl: 3   Budeson-Glycopyrrol-Formoterol (BREZTRI AEROSPHERE) 160-9-4.8 MCG/ACT AERO, Inhale 2 puffs into the lungs in the morning and at bedtime., Disp: 1 each, Rfl: 5   Cranberry 500 MG TABS, Take 1 tablet by mouth daily as needed (uti prevention)., Disp: , Rfl:    guaiFENesin (MUCINEX) 600 MG 12 hr tablet, Take 600 mg by mouth 2 (two) times daily as needed for cough., Disp: , Rfl:    Multiple Vitamin (MULTIVITAMIN ADULT) TABS, Take 1 tablet by mouth daily., Disp: , Rfl:    varenicline (CHANTIX CONTINUING MONTH PAK) 1 MG tablet, Take 1 tablet (1 mg total) by mouth 2 (two) times daily., Disp: 60 tablet, Rfl: 5   vitamin C (ASCORBIC ACID) 500 MG tablet, Take 500 mg by mouth daily., Disp: , Rfl:   Past Medical History: Past Medical History:  Diagnosis Date   Asthma     Tobacco Use: Social History   Tobacco Use  Smoking Status Former   Packs/day: 1.00   Years: 40.00   Total pack years: 40.00   Types: Cigarettes   Start date:  03/05/1981   Quit date: 10/14/2021   Years since quitting: 0.1  Smokeless Tobacco Never    Labs: Review Flowsheet       Latest Ref Rng & Units 11/02/2020 12/21/2020  Labs for ITP Cardiac and Pulmonary Rehab  Cholestrol 0 - 200 mg/dL - 177   LDL (calc) 0 - 99 mg/dL - 91   HDL-C >39.00 mg/dL - 74.10   Trlycerides 0.0 - 149.0 mg/dL - 59.0   Bicarbonate 20.0 - 28.0 mmol/L 22.2  -  TCO2 22 - 32 mmol/L 23  -  Acid-base deficit 0.0 - 2.0 mmol/L 3.0  -  O2 Saturation % 90.0  -    Capillary Blood Glucose: No results found for: "GLUCAP"   Pulmonary Assessment Scores:  Pulmonary Assessment Scores     Row Name 11/22/21 1426         ADL UCSD   ADL Phase Entry     SOB Score total 45       CAT Score   CAT Score 24       mMRC Score   mMRC Score 2             UCSD: Self-administered rating of dyspnea associated with activities of daily living (ADLs) 6-point scale (0 = "not at all" to 5 = "maximal or unable to do because of breathlessness")  Scoring Scores range from 0 to 120.  Minimally  important difference is 5 units  CAT: CAT can identify the health impairment of COPD patients and is better correlated with disease progression.  CAT has a scoring range of zero to 40. The CAT score is classified into four groups of low (less than 10), medium (10 - 20), high (21-30) and very high (31-40) based on the impact level of disease on health status. A CAT score over 10 suggests significant symptoms.  A worsening CAT score could be explained by an exacerbation, poor medication adherence, poor inhaler technique, or progression of COPD or comorbid conditions.  CAT MCID is 2 points  mMRC: mMRC (Modified Medical Research Council) Dyspnea Scale is used to assess the degree of baseline functional disability in patients of respiratory disease due to dyspnea. No minimal important difference is established. A decrease in score of 1 point or greater is considered a positive change.   Pulmonary  Function Assessment:  Pulmonary Function Assessment - 11/22/21 1429       Breath   Bilateral Breath Sounds Decreased   diminished throughout   Shortness of Breath Yes;Limiting activity             Exercise Target Goals: Exercise Program Goal: Individual exercise prescription set using results from initial 6 min walk test and THRR while considering  patient's activity barriers and safety.   Exercise Prescription Goal: Initial exercise prescription builds to 30-45 minutes a day of aerobic activity, 2-3 days per week.  Home exercise guidelines will be given to patient during program as part of exercise prescription that the participant will acknowledge.  Activity Barriers & Risk Stratification:  Activity Barriers & Cardiac Risk Stratification - 11/22/21 1423       Activity Barriers & Cardiac Risk Stratification   Activity Barriers Deconditioning;Muscular Weakness;Shortness of Breath    Cardiac Risk Stratification Moderate             6 Minute Walk:  6 Minute Walk     Row Name 11/22/21 1526         6 Minute Walk   Phase Initial     Distance 1170 feet     Walk Time 6 minutes     # of Rest Breaks 0     MPH 2.22     METS 3.17     RPE 13     Perceived Dyspnea  2     VO2 Peak 11.09     Symptoms Yes (comment)     Comments SOB, practiced pursed lip breathing     Resting HR 70 bpm     Resting BP 104/70     Resting Oxygen Saturation  98 %     Exercise Oxygen Saturation  during 6 min walk 91 %     Max Ex. HR 80 bpm     Max Ex. BP 120/60     2 Minute Post BP 112/70       Interval HR   1 Minute HR 70     2 Minute HR 80     3 Minute HR 76     4 Minute HR 76     5 Minute HR 76     6 Minute HR 76     2 Minute Post HR 71     Interval Heart Rate? Yes       Interval Oxygen   Interval Oxygen? Yes     Baseline Oxygen Saturation % 98 %     1 Minute Oxygen Saturation % 95 %  1 Minute Liters of Oxygen 0 L     2 Minute Oxygen Saturation % 95 %     2 Minute Liters  of Oxygen 0 L     3 Minute Oxygen Saturation % 94 %     3 Minute Liters of Oxygen 0 L     4 Minute Oxygen Saturation % 91 %     4 Minute Liters of Oxygen 0 L     5 Minute Oxygen Saturation % 93 %     5 Minute Liters of Oxygen 0 L     6 Minute Oxygen Saturation % 93 %     6 Minute Liters of Oxygen 0 L     2 Minute Post Oxygen Saturation % 98 %     2 Minute Post Liters of Oxygen 0 L              Oxygen Initial Assessment:  Oxygen Initial Assessment - 11/24/21 1300       Home Oxygen   Home Oxygen Device None    Sleep Oxygen Prescription None    Home Exercise Oxygen Prescription None    Home Resting Oxygen Prescription None    Compliance with Home Oxygen Use Yes      Initial 6 min Walk   Oxygen Used None      Program Oxygen Prescription   Program Oxygen Prescription None      Intervention   Short Term Goals To learn and exhibit compliance with exercise, home and travel O2 prescription;To learn and understand importance of monitoring SPO2 with pulse oximeter and demonstrate accurate use of the pulse oximeter.;To learn and understand importance of maintaining oxygen saturations>88%;To learn and demonstrate proper pursed lip breathing techniques or other breathing techniques. ;To learn and demonstrate proper use of respiratory medications    Long  Term Goals Exhibits compliance with exercise, home  and travel O2 prescription;Verbalizes importance of monitoring SPO2 with pulse oximeter and return demonstration;Maintenance of O2 saturations>88%;Exhibits proper breathing techniques, such as pursed lip breathing or other method taught during program session;Compliance with respiratory medication;Demonstrates proper use of MDI's             Oxygen Re-Evaluation:  Oxygen Re-Evaluation     Row Name 12/21/21 0755             Program Oxygen Prescription   Program Oxygen Prescription None         Home Oxygen   Home Oxygen Device None       Sleep Oxygen Prescription None        Home Exercise Oxygen Prescription None       Home Resting Oxygen Prescription None       Compliance with Home Oxygen Use Yes         Goals/Expected Outcomes   Short Term Goals To learn and exhibit compliance with exercise, home and travel O2 prescription;To learn and understand importance of monitoring SPO2 with pulse oximeter and demonstrate accurate use of the pulse oximeter.;To learn and understand importance of maintaining oxygen saturations>88%;To learn and demonstrate proper pursed lip breathing techniques or other breathing techniques. ;To learn and demonstrate proper use of respiratory medications       Long  Term Goals Exhibits compliance with exercise, home  and travel O2 prescription;Verbalizes importance of monitoring SPO2 with pulse oximeter and return demonstration;Maintenance of O2 saturations>88%;Exhibits proper breathing techniques, such as pursed lip breathing or other method taught during program session;Compliance with respiratory medication;Demonstrates proper use of MDI's  Goals/Expected Outcomes Compliance and understanding of oxygen saturations monitoring and breathing techniques to decrease shortness of breath.                Oxygen Discharge (Final Oxygen Re-Evaluation):  Oxygen Re-Evaluation - 12/21/21 0755       Program Oxygen Prescription   Program Oxygen Prescription None      Home Oxygen   Home Oxygen Device None    Sleep Oxygen Prescription None    Home Exercise Oxygen Prescription None    Home Resting Oxygen Prescription None    Compliance with Home Oxygen Use Yes      Goals/Expected Outcomes   Short Term Goals To learn and exhibit compliance with exercise, home and travel O2 prescription;To learn and understand importance of monitoring SPO2 with pulse oximeter and demonstrate accurate use of the pulse oximeter.;To learn and understand importance of maintaining oxygen saturations>88%;To learn and demonstrate proper pursed lip breathing techniques  or other breathing techniques. ;To learn and demonstrate proper use of respiratory medications    Long  Term Goals Exhibits compliance with exercise, home  and travel O2 prescription;Verbalizes importance of monitoring SPO2 with pulse oximeter and return demonstration;Maintenance of O2 saturations>88%;Exhibits proper breathing techniques, such as pursed lip breathing or other method taught during program session;Compliance with respiratory medication;Demonstrates proper use of MDI's    Goals/Expected Outcomes Compliance and understanding of oxygen saturations monitoring and breathing techniques to decrease shortness of breath.             Initial Exercise Prescription:  Initial Exercise Prescription - 11/22/21 1500       Date of Initial Exercise RX and Referring Provider   Date 11/22/21    Referring Provider [name]    Expected Discharge Date 01/27/22      Recumbant Bike   Level 1    RPM 70    Minutes 15      Recumbant Elliptical   Level 1    RPM 70    Minutes 15      Prescription Details   Frequency (times per week) 2    Duration Progress to 30 minutes of continuous aerobic without signs/symptoms of physical distress      Intensity   THRR 40-80% of Max Heartrate 66-132    Ratings of Perceived Exertion 11-13    Perceived Dyspnea 0-4      Progression   Progression Continue progressive overload as per policy without signs/symptoms or physical distress.      Resistance Training   Training Prescription Yes    Weight blue bands    Reps 10-15             Perform Capillary Blood Glucose checks as needed.  Exercise Prescription Changes:   Exercise Prescription Changes     Row Name 12/16/21 1600 12/21/21 1352 12/22/21 1300         Response to Exercise   Blood Pressure (Admit) -- 110/72 --     Blood Pressure (Exercise) -- 119/86 --     Blood Pressure (Exit) -- 112/82 --     Heart Rate (Admit) -- 76 bpm --     Heart Rate (Exercise) -- 117 bpm --     Heart Rate  (Exit) -- 82 bpm --     Oxygen Saturation (Admit) -- 98 % --     Oxygen Saturation (Exercise) -- 95 % --     Oxygen Saturation (Exit) -- 94 % --     Rating of Perceived Exertion (Exercise) -- 13 --  Perceived Dyspnea (Exercise) -- 2 --     Duration -- Continue with 30 min of aerobic exercise without signs/symptoms of physical distress. --     Intensity -- THRR unchanged --       Progression   Progression -- Continue to progress workloads to maintain intensity without signs/symptoms of physical distress. --       Horticulturist, commercial Prescription -- Yes --     Weight -- blue bands --     Reps -- 10-15 --     Time -- 10 Minutes --       Recumbant Bike   Level -- 3 --     RPM -- 70 --     Minutes -- 3.7 --       Recumbant Elliptical   Level -- 3 --     Minutes -- 4 --       Home Exercise Plan   Plans to continue exercise at Home (comment)  walking -- --     Frequency --  n/a, pt is walking most days -- --     Initial Home Exercises Provided 12/16/21 -- --              Exercise Comments:   Exercise Comments     Row Name 12/16/21 1619           Exercise Comments Discussed pts home exercise plan. She is currently walking 30-60 min 3-4 days a week on top of coming to [place]. No change needed, encouraged pt to continue.                Exercise Goals and Review:   Exercise Goals     Row Name 11/22/21 1519 11/24/21 1259 12/21/21 0732         Exercise Goals   Increase Physical Activity Yes Yes Yes     Intervention Provide advice, education, support and counseling about physical activity/exercise needs.;Develop an individualized exercise prescription for aerobic and resistive training based on initial evaluation findings, risk stratification, comorbidities and participant's personal goals. Provide advice, education, support and counseling about physical activity/exercise needs.;Develop an individualized exercise prescription for aerobic and resistive  training based on initial evaluation findings, risk stratification, comorbidities and participant's personal goals. Provide advice, education, support and counseling about physical activity/exercise needs.;Develop an individualized exercise prescription for aerobic and resistive training based on initial evaluation findings, risk stratification, comorbidities and participant's personal goals.     Expected Outcomes Short Term: Attend rehab on a regular basis to increase amount of physical activity.;Long Term: Exercising regularly at least 3-5 days a week.;Long Term: Add in home exercise to make exercise part of routine and to increase amount of physical activity. Short Term: Attend rehab on a regular basis to increase amount of physical activity.;Long Term: Exercising regularly at least 3-5 days a week.;Long Term: Add in home exercise to make exercise part of routine and to increase amount of physical activity. Short Term: Attend rehab on a regular basis to increase amount of physical activity.;Long Term: Exercising regularly at least 3-5 days a week.;Long Term: Add in home exercise to make exercise part of routine and to increase amount of physical activity.     Increase Strength and Stamina Yes Yes Yes     Intervention Provide advice, education, support and counseling about physical activity/exercise needs.;Develop an individualized exercise prescription for aerobic and resistive training based on initial evaluation findings, risk stratification, comorbidities and participant's personal goals. Provide advice, education, support and counseling  about physical activity/exercise needs.;Develop an individualized exercise prescription for aerobic and resistive training based on initial evaluation findings, risk stratification, comorbidities and participant's personal goals. Provide advice, education, support and counseling about physical activity/exercise needs.;Develop an individualized exercise prescription for  aerobic and resistive training based on initial evaluation findings, risk stratification, comorbidities and participant's personal goals.     Expected Outcomes Short Term: Increase workloads from initial exercise prescription for resistance, speed, and METs.;Short Term: Perform resistance training exercises routinely during rehab and add in resistance training at home;Long Term: Improve cardiorespiratory fitness, muscular endurance and strength as measured by increased METs and functional capacity (6MWT) Short Term: Increase workloads from initial exercise prescription for resistance, speed, and METs.;Short Term: Perform resistance training exercises routinely during rehab and add in resistance training at home;Long Term: Improve cardiorespiratory fitness, muscular endurance and strength as measured by increased METs and functional capacity (6MWT) Short Term: Increase workloads from initial exercise prescription for resistance, speed, and METs.;Short Term: Perform resistance training exercises routinely during rehab and add in resistance training at home;Long Term: Improve cardiorespiratory fitness, muscular endurance and strength as measured by increased METs and functional capacity (6MWT)     Able to understand and use rate of perceived exertion (RPE) scale Yes Yes Yes     Intervention Provide education and explanation on how to use RPE scale Provide education and explanation on how to use RPE scale Provide education and explanation on how to use RPE scale     Expected Outcomes Short Term: Able to use RPE daily in rehab to express subjective intensity level;Long Term:  Able to use RPE to guide intensity level when exercising independently Short Term: Able to use RPE daily in rehab to express subjective intensity level;Long Term:  Able to use RPE to guide intensity level when exercising independently Short Term: Able to use RPE daily in rehab to express subjective intensity level;Long Term:  Able to use RPE to  guide intensity level when exercising independently     Able to understand and use Dyspnea scale Yes Yes Yes     Intervention Provide education and explanation on how to use Dyspnea scale Provide education and explanation on how to use Dyspnea scale Provide education and explanation on how to use Dyspnea scale     Expected Outcomes Short Term: Able to use Dyspnea scale daily in rehab to express subjective sense of shortness of breath during exertion;Long Term: Able to use Dyspnea scale to guide intensity level when exercising independently Short Term: Able to use Dyspnea scale daily in rehab to express subjective sense of shortness of breath during exertion;Long Term: Able to use Dyspnea scale to guide intensity level when exercising independently Short Term: Able to use Dyspnea scale daily in rehab to express subjective sense of shortness of breath during exertion;Long Term: Able to use Dyspnea scale to guide intensity level when exercising independently     Knowledge and understanding of Target Heart Rate Range (THRR) Yes Yes Yes     Intervention Provide education and explanation of THRR including how the numbers were predicted and where they are located for reference Provide education and explanation of THRR including how the numbers were predicted and where they are located for reference Provide education and explanation of THRR including how the numbers were predicted and where they are located for reference     Expected Outcomes Short Term: Able to state/look up THRR;Long Term: Able to use THRR to govern intensity when exercising independently;Short Term: Able to use daily  as guideline for intensity in rehab Short Term: Able to state/look up THRR;Long Term: Able to use THRR to govern intensity when exercising independently;Short Term: Able to use daily as guideline for intensity in rehab Short Term: Able to state/look up THRR;Long Term: Able to use THRR to govern intensity when exercising  independently;Short Term: Able to use daily as guideline for intensity in rehab     Understanding of Exercise Prescription Yes Yes Yes     Intervention Provide education, explanation, and written materials on patient's individual exercise prescription Provide education, explanation, and written materials on patient's individual exercise prescription Provide education, explanation, and written materials on patient's individual exercise prescription     Expected Outcomes Short Term: Able to explain program exercise prescription;Long Term: Able to explain home exercise prescription to exercise independently Short Term: Able to explain program exercise prescription;Long Term: Able to explain home exercise prescription to exercise independently Short Term: Able to explain program exercise prescription;Long Term: Able to explain home exercise prescription to exercise independently              Exercise Goals Re-Evaluation :  Exercise Goals Re-Evaluation     [place] Name 11/24/21 1259 12/21/21 0732           Exercise Goal Re-Evaluation   Exercise Goals Review Increase Physical Activity;Increase Strength and Stamina;Able to understand and use rate of perceived exertion (RPE) scale;Able to understand and use Dyspnea scale;Knowledge and understanding of Target Heart Rate Range (THRR);Understanding of Exercise Prescription Increase Physical Activity;Increase Strength and Stamina;Able to understand and use rate of perceived exertion (RPE) scale;Able to understand and use Dyspnea scale;Knowledge and understanding of Target Heart Rate Range (THRR);Understanding of Exercise Prescription      Comments Pt will begin exercise next week. Will monitor for progression. Pt has completed 6 exercise sessions. She is motivated and has not missed sessions. She is exercising on the recumbent elliptical for 15 min at level 3, METs 2.9 (however was talking this day and has had 3.4 METs). Then she exercises on the recumbent bike  for 15 min at level 3, METs 3.9. She performs warm up and cool down independently. She is exercising at home.  Will monitor for continued progression.      Expected Outcomes Through exercise at rehab and home, the patient will decrease shortness of breath with daily activities and feel confident in carrying out an exercise regimen at home. Through exercise at rehab and home, the patient will decrease shortness of breath with daily activities and feel confident in carrying out an exercise regimen at home.               Discharge Exercise Prescription (Final Exercise Prescription Changes):  Exercise Prescription Changes - 12/22/21 1300       Resistance Training   Time --             Nutrition:  Target Goals: Understanding of nutrition guidelines, daily intake of sodium <1557m, cholesterol <2067m calories 30% from fat and 7% or less from saturated fats, daily to have 5 or more servings of fruits and vegetables.  Biometrics:  Pre Biometrics - 11/22/21 1529       Pre Biometrics   Grip Strength 26 kg              Nutrition Therapy Plan and Nutrition Goals:  Nutrition Therapy & Goals - 12/21/21 1533       Nutrition Therapy   Diet General Healthy Diet      Personal Nutrition Goals  Nutrition Goal Patient to improve dietary quality by using the plate method as a daily guide for meal planning to include lean protein/plant protein, fruits, vegetables, whole grains, and nonfat/low fat dairy as part of well balanced diet    Personal Goal #2 Patient to identify strategies for weight loss of 0.5-2.0# per week of weight loss.    Comments Patient remains precontemplative toward nutrition changes. She reports over snacking and snacking in the middle of the night. She has been working on smoking cessation during this time.  [place] declines referral to behavioral health/counseling to aid with emotional eating, reports of depression, etc. She is up 2.6# since starting with our program  and up 29.3# since March 2023. [place] would continue to benefit from weight loss and decrease intake of refined carbohydrates to support pulmonary disease.      Intervention Plan   Intervention Prescribe, educate and counsel regarding individualized specific dietary modifications aiming towards targeted core components such as weight, hypertension, lipid management, diabetes, heart failure and other comorbidities.;Nutrition handout(s) given to patient.    Expected Outcomes Short Term Goal: Understand basic principles of dietary content, such as calories, fat, sodium, cholesterol and nutrients.;Long Term Goal: Adherence to prescribed nutrition plan.             Nutrition Assessments:  MEDIFICTS Score Key: ?70 Need to make dietary changes  40-70 Heart Healthy Diet ? 40 Therapeutic Level Cholesterol Diet   Picture Your Plate Scores: <58 Unhealthy dietary pattern with much room for improvement. 41-50 Dietary pattern unlikely to meet recommendations for good health and room for improvement. 51-60 More healthful dietary pattern, with some room for improvement.  >60 Healthy dietary pattern, although there may be some specific behaviors that could be improved.    Nutrition Goals Re-Evaluation:  Nutrition Goals Re-Evaluation     Yaak Name 11/30/21 1436 12/21/21 1533           Goals   Current Weight 153 lb 10.6 oz (69.7 kg) 156 lb 8.4 oz (71 kg)      Comment labs WNL No new labs at this time.      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 She lives at home with her husband. She does the grocery shopping and cooking. She reports normal appetite and eats three meals per day. She does report eating in the middle of the night and some emotional type eating over the last 6+ months. She was using Chantix to quit smoking. Her usual body weight is 128-130#; she is up ~25# over the last 11 months. 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 She lives at home with her husband. She does  the grocery shopping and cooking. She reports normal appetite and eats three meals per day. She does report eating in the middle of the night and some emotional type eating over the last 6+ months. She was using Chantix to quit smoking. Her typical weight is 128-130#; she is up ~25# over the last 11 months.  Gave resources on COPD nutrition, strategies for weight loss, and mindful eating resources. Patient remains precontemplative toward nutrition changes. She reports over snacking and snacking in the middle of the night. She has been working on smoking cessation during this time. Ayla declines referral to behavioral health/counseling to aid with emotional eating, reports of depression, etc. She is up 2.6# since starting with our program and up 29.3# since March 2023. Allahna would continue to benefit from weight loss and decreased intake of refined  carbohydrates to support pulmonary disease. She does report good support from her husband and father.               Nutrition Goals Discharge (Final Nutrition Goals Re-Evaluation):  Nutrition Goals Re-Evaluation - 12/21/21 1533       Goals   Current Weight 156 lb 8.4 oz (71 kg)    Comment No new labs at this time.    Expected Outcome Patient remains precontemplative toward nutrition changes. She reports over snacking and snacking in the middle of the night. She has been working on smoking cessation during this time. Sylvester declines referral to behavioral health/counseling to aid with emotional eating, reports of depression, etc. She is up 2.6# since starting with our program and up 29.3# since March 2023. Brinna would continue to benefit from weight loss and decreased intake of refined carbohydrates to support pulmonary disease. She does report good support from her husband and father.             Psychosocial: Target Goals: Acknowledge presence or absence of significant depression and/or stress, maximize coping skills, provide positive support  system. Participant is able to verbalize types and ability to use techniques and skills needed for reducing stress and depression.  Initial Review & Psychosocial Screening:  Initial Psych Review & Screening - 11/22/21 1405       Initial Review   Current issues with Current Depression      Family Dynamics   Good Support System? Yes    Comments Husband, Brigid Re, 2 daughters      Barriers   Psychosocial barriers to participate in program The patient should benefit from training in stress management and relaxation.      Screening Interventions   Interventions Encouraged to exercise    Expected Outcomes Short Term goal: Utilizing psychosocial counselor, staff and physician to assist with identification of specific Stressors or current issues interfering with healing process. Setting desired goal for each stressor or current issue identified.;Long Term Goal: Stressors or current issues are controlled or eliminated.;Short Term goal: Identification and review with participant of any Quality of Life or Depression concerns found by scoring the questionnaire.;Long Term goal: The participant improves quality of Life and PHQ9 Scores as seen by post scores and/or verbalization of changes             Quality of Life Scores:  Scores of 19 and below usually indicate a poorer quality of life in these areas.  A difference of  2-3 points is a clinically meaningful difference.  A difference of 2-3 points in the total score of the Quality of Life Index has been associated with significant improvement in overall quality of life, self-image, physical symptoms, and general health in studies assessing change in quality of life.  PHQ-9: Review Flowsheet       11/22/2021 03/22/2021 12/21/2020  Depression screen PHQ 2/9  Decreased Interest 1 1 0  Down, Depressed, Hopeless _0 PHQ - 2 Score _1 Altered sleeping 2 3 -  Tired, decreased energy 1 3 -  Change in appetite 2 1 -  Feeling bad or failure  about yourself  3 0 -  Trouble concentrating 0 0 -  Moving slowly or fidgety/restless 1 0 -  Suicidal thoughts 0 0 -  PHQ-9 Score 11 9 -  Difficult doing work/chores Somewhat difficult - -   Interpretation of Total Score  Total Score Depression Severity:  1-4 = Minimal depression, 5-9 = Mild depression,  10-14 = Moderate depression, 15-19 = Moderately severe depression, 20-27 = Severe depression   Psychosocial Evaluation and Intervention:  Psychosocial Evaluation - 11/22/21 1415       Psychosocial Evaluation & Interventions   Interventions Stress management education;Relaxation education;Encouraged to exercise with the program and follow exercise prescription    Comments Delma will benefit from exercise    Expected Outcomes For pt to participate in [place]  Follow up required by staff             Psychosocial Re-Evaluation:  Psychosocial Re-Evaluation     [place] Name 11/23/21 0954 12/22/21 1320           Psychosocial Re-Evaluation   Current issues with Current Depression Current Depression      Comments Vinisha has completed PR orientation and is starting the program 11/30/21. We will continue to monitor for psychosocail barriers. Trang admits that she has depression and was advised to follow up with counseling but, she has elected not to do this. This has not stopped her from attending the PR program. She states that she is enjoying the program and interacts with others in her class. We have provided her with a list of resources for seeking out a counselor. She is participating in the relaxation sessions during cool down.      Expected Outcomes For her to participate in the program without any psychosocial issues or concerns. For her to attend the program without psychosocial issues or concerns.      Interventions Stress management education;Relaxation education Encouraged to attend Pulmonary Rehabilitation for the exercise;Relaxation education;Stress  management education      Continue Psychosocial Services  No Follow up required No Follow up required               Psychosocial Discharge (Final Psychosocial Re-Evaluation):  Psychosocial Re-Evaluation - 12/22/21 1320       Psychosocial Re-Evaluation   Current issues with Current Depression    Comments Sarae admits that she has depression and was advised to follow up with counseling but, she has elected not to do this. This has not stopped her from attending the PR program. She states that she is enjoying the program and interacts with others in her class. We have provided her with a list of resources for seeking out a counselor. She is participating in the relaxation sessions during cool down.    Expected Outcomes For her to attend the program without psychosocial issues or concerns.    Interventions Encouraged to attend Pulmonary Rehabilitation for the exercise;Relaxation education;Stress management education    Continue Psychosocial Services  No Follow up required             Education: Education Goals: Education classes will be provided on a weekly basis, covering required topics. Participant will state understanding/return demonstration of topics presented.  Learning Barriers/Preferences:  Learning Barriers/Preferences - 11/22/21 1415       Learning Barriers/Preferences   Learning Barriers Sight    Learning Preferences Audio;Group Instruction;Individual Instruction;Verbal Instruction;Video;Written Material             Education Topics: Introduction to Pulmonary Rehab Group instruction provided by PowerPoint, verbal discussion, and written material to support subject matter. Instructor reviews what Pulmonary Rehab is, the purpose of the program, and how patients are referred.     Know Your Numbers Group instruction that is supported by a PowerPoint presentation. Instructor discusses importance of knowing and understanding resting, exercise, and post-exercise  oxygen saturation, heart  rate, and blood pressure. Oxygen saturation, heart rate, blood pressure, rating of perceived exertion, and dyspnea are reviewed along with a normal range for these values.    Exercise for the Pulmonary Patient Group instruction that is supported by a PowerPoint presentation. Instructor discusses benefits of exercise, core components of exercise, frequency, duration, and intensity of an exercise routine, importance of utilizing pulse oximetry during exercise, safety while exercising, and options of places to exercise outside of rehab.       MET Level  Group instruction provided by PowerPoint, verbal discussion, and written material to support subject matter. Instructor reviews what METs are and how to increase METs.    Pulmonary Medications Verbally interactive group education provided by instructor with focus on inhaled medications and proper administration.   Anatomy and Physiology of the Respiratory System Group instruction provided by PowerPoint, verbal discussion, and written material to support subject matter. Instructor reviews respiratory cycle and anatomical components of the respiratory system and their functions. Instructor also reviews differences in obstructive and restrictive respiratory diseases with examples of each.    Oxygen Safety Group instruction provided by PowerPoint, verbal discussion, and written material to support subject matter. There is an overview of "What is Oxygen" and "Why do we need it".  Instructor also reviews how to create a safe environment for oxygen use, the importance of using oxygen as prescribed, and the risks of noncompliance. There is a brief discussion on traveling with oxygen and resources the patient may utilize.   Oxygen Use Group instruction provided by PowerPoint, verbal discussion, and written material to discuss how supplemental oxygen is prescribed and different types of oxygen supply systems. Resources for more  information are provided.    Breathing Techniques Group instruction that is supported by demonstration and informational handouts. Instructor discusses the benefits of pursed lip and diaphragmatic breathing and detailed demonstration on how to perform both.  Flowsheet Row PULMONARY REHAB CHRONIC OBSTRUCTIVE PULMONARY DISEASE from 12/16/2021 in [facility], V[facility], & [facility]  Date 12/02/21  Educator EP  Instruction Review Code 1- Verbalizes Understanding        Risk Factor Reduction Group instruction that is supported by a PowerPoint presentation. Instructor discusses the definition of a risk factor, different risk factors for pulmonary disease, and how the heart and lungs work together. Flowsheet Row PULMONARY REHAB CHRONIC OBSTRUCTIVE PULMONARY DISEASE from 12/16/2021 in [facility] for Heart, Vascular, & [place]  Date 12/16/21  Educator EP  Instruction Review Code 1- Verbalizes Understanding       MD Day A group question and answer session with a medical doctor that allows participants to ask questions that relate to their pulmonary disease state.   Nutrition for the Pulmonary Patient Group instruction provided by PowerPoint slides, verbal discussion, and written materials to support subject matter. The instructor gives an explanation and review of healthy diet recommendations, which includes a discussion on weight management, recommendations for fruit and vegetable consumption, as well as protein, fluid, caffeine, fiber, sodium, sugar, and alcohol. Tips for eating when patients are short of breath are discussed.    Other Education Group or individual verbal, written, or video instructions that support the educational goals of the pulmonary rehab program. Flowsheet Row PULMONARY REHAB CHRONIC OBSTRUCTIVE PULMONARY DISEASE from 12/16/2021 in [facility] for Heart, Vascular, & Lung Health   Date 12/09/21  [Stress and Energy]  Educator EP  Instruction Review Code 1- Verbalizes Understanding  Knowledge Questionnaire Score:  Knowledge Questionnaire Score - 11/22/21 1416       Knowledge Questionnaire Score   Pre Score 17/18             Core Components/Risk Factors/Patient Goals at Admission:  Personal Goals and Risk Factors at Admission - 11/22/21 1419       Core Components/Risk Factors/Patient Goals on Admission    Weight Management Weight Loss    Tobacco Cessation Yes    Number of packs per day only occasional puff currently    Intervention Offer self-teaching materials, assist with locating and accessing local/national Quit Smoking programs, and support quit date choice.;Assist the participant in steps to quit. Provide individualized education and counseling about committing to Tobacco Cessation, relapse prevention, and pharmacological support that can be provided by physician.    Expected Outcomes Short Term: Will quit all tobacco product use, adhering to prevention of relapse plan.;Long Term: Complete abstinence from all tobacco products for at least 12 months from quit date.    Improve shortness of breath with ADL's Yes    Intervention Provide education, individualized exercise plan and daily activity instruction to help decrease symptoms of SOB with activities of daily living.    Expected Outcomes Short Term: Improve cardiorespiratory fitness to achieve a reduction of symptoms when performing ADLs;Long Term: Be able to perform more ADLs without symptoms or delay the onset of symptoms             Core Components/Risk Factors/Patient Goals Review:   Goals and Risk Factor Review     Row Name 11/24/21 1043 12/22/21 1327           Core Components/Risk Factors/Patient Goals Review   Personal Goals Review Weight Management/Obesity;Tobacco Cessation;Improve shortness of breath with ADL's;Develop more efficient breathing techniques such as purse lipped  breathing and diaphragmatic breathing and practicing self-pacing with activity. Weight Management/Obesity;Tobacco Cessation;Improve shortness of breath with ADL's;Develop more efficient breathing techniques such as purse lipped breathing and diaphragmatic breathing and practicing self-pacing with activity.      Review Carrie is starting the program 11/30/21 at 1:15 pm. We will continue to monitor her progression towards her core components. Jasia's weights have been stable no loss or gains. She has attended the breathing techniques education. No change in her tobacco use occasional puffs. She is exercising on the octane and recumbent bike. She has had increasung workloads and METS on both. She is reporting mild to mild with difficulity SOB and her oxygen saturation have been 93-96% with exercising.      Expected Outcomes See goals admission goals See admission goals.               Core Components/Risk Factors/Patient Goals at Discharge (Final Review):   Goals and Risk Factor Review - 12/22/21 1327       Core Components/Risk Factors/Patient Goals Review   Personal Goals Review Weight Management/Obesity;Tobacco Cessation;Improve shortness of breath with ADL's;Develop more efficient breathing techniques such as purse lipped breathing and diaphragmatic breathing and practicing self-pacing with activity.    Review Darrell's weights have been stable no loss or gains. She has attended the breathing techniques education. No change in her tobacco use occasional puffs. She is exercising on the octane and recumbent bike. She has had increasung workloads and METS on both. She is reporting mild to mild with difficulity SOB and her oxygen saturation have been 93-96% with exercising.    Expected Outcomes See admission goals.  ITP Comments: ITP REVIEW Pt is making expected progress toward pulmonary rehab goals after completing 7 sessions. Recommend continued exercise, life style modification,  education, and utilization of breathing techniques to increase stamina and strength and decrease shortness of breath with exertion.   Comments: Dr. Rodman Pickle is Medical Director for Pulmonary Rehab at [facility].

## 2021-12-22 NOTE — Progress Notes (Signed)
Daily Session Note  Patient Details  Name: Jill Glenn MRN: [id number] Date of Birth: August 06, 1965 Referring Provider:   [name] Pulmonary Rehab Walk Test from 11/22/2021 in [facility] for Heart, Vascular, & [place]  Referring Provider [name]       Encounter Date: 12/21/2021  Check In:  Session Check In - 12/21/21 1436       Check-In   Supervising physician immediately available to respond to emergencies [facility] - Physician supervision    Physician(s) Gala Murdoch    Location [facility]    Staff Present Elmon Else, MS, ACSM-CEP, Exercise Physiologist;Other;Randi Yevonne Pax, ACSM-CEP, Exercise Physiologist    Virtual Visit No    Medication changes reported     No    Fall or balance concerns reported    No    Tobacco Cessation No Change    Warm-up and Cool-down Performed as group-led instruction    Resistance Training Performed Yes    VAD Patient? No    PAD/SET Patient? No      Pain Assessment   Currently in Pain? No/denies    Pain Score 0-No pain    Multiple Pain Sites No               12/21/21 1352  Response to Exercise  Blood Pressure (Admit) 110/72  Blood Pressure (Exercise) 119/86  Blood Pressure (Exit) 112/82  Heart Rate (Admit) 76 bpm  Heart Rate (Exercise) 117 bpm  Heart Rate (Exit) 82 bpm  Oxygen Saturation (Admit) 98 %  Oxygen Saturation (Exercise) 95 %  Oxygen Saturation (Exit) 94 %  Rating of Perceived Exertion (Exercise) 13  Perceived Dyspnea (Exercise) 2  Duration Continue with 30 min of aerobic exercise without signs/symptoms of physical distress.  Intensity THRR unchanged  Progression  Progression Continue to progress workloads to maintain intensity without signs/symptoms of physical distress.  Resistance Training  Training Prescription Yes  Weight blue bands  Reps 10-15  Time 10 Minutes  Recumbant Bike  Level 3  RPM 70  Minutes 3.7  Recumbant Elliptical  Level 3  Minutes 4     Capillary Blood Glucose: No results found for this or any previous visit (from the past 24 hour(s)).   Exercise Prescription Changes - 12/22/21 1300       Resistance Training   Time --             Social History   Tobacco Use  Smoking Status Former   Packs/day: 1.00   Years: 40.00   Total pack years: 40.00   Types: Cigarettes   Start date: 03/05/1981   Quit date: 10/14/2021   Years since quitting: 0.1  Smokeless Tobacco Never    Goals Met:  Independence with exercise equipment Improved SOB with ADL's No report of concerns or symptoms today Strength training completed today  Goals Unmet:  Not Applicable  Comments: Service time is from 1319 to [place]    Dr. Rodman Pickle is Medical Director for Pulmonary Rehab at [facility].

## 2021-12-23 ENCOUNTER — Encounter (HOSPITAL_COMMUNITY)
Admission: RE | Admit: 2021-12-23 | Discharge: 2021-12-23 | Disposition: A | Discharge status: Home / Self Care | Payer: 59 | Source: Ambulatory Visit | Attending: Internal Medicine | Admitting: Internal Medicine

## 2021-12-23 DIAGNOSIS — J449 Chronic obstructive pulmonary disease, unspecified: Secondary | ICD-10-CM

## 2021-12-23 NOTE — Progress Notes (Signed)
Daily Session Note  Patient Details  Name: Jill Glenn MRN: [id number] Date of Birth: 05-28-1965 Referring Provider:   [name] Pulmonary Rehab Walk Test from 11/22/2021 in [facility] for Heart, Vascular, & [place]  Referring Provider [name]       Encounter Date: 12/23/2021  Check In:  Session Check In - 12/23/21 0721       Check-In   Supervising physician immediately available to respond to emergencies [facility] - Physician supervision    Physician(s) Dr. Gala Murdoch    Location [facility]    Staff Present Elmon Else, MS, ACSM-CEP, Exercise Physiologist;Other;Lisa Ysidro Evert, RN    Virtual Visit No    Medication changes reported     No    Fall or balance concerns reported    No    Tobacco Cessation No Change    Warm-up and Cool-down Performed as group-led instruction    Resistance Training Performed Yes    VAD Patient? No    PAD/SET Patient? No      Pain Assessment   Currently in Pain? No/denies    Multiple Pain Sites No             Capillary Blood Glucose: No results found for this or any previous visit (from the past 24 hour(s)).    Social History   Tobacco Use  Smoking Status Former   Packs/day: 1.00   Years: 40.00   Total pack years: 40.00   Types: Cigarettes   Start date: 03/05/1981   Quit date: 10/14/2021   Years since quitting: 0.1  Smokeless Tobacco Never    Goals Met:  Proper associated with RPD/PD & O2 Sat Independence with exercise equipment Exercise tolerated well No report of concerns or symptoms today Strength training completed today  Goals Unmet:  Not Applicable  Comments: Service time is from 1323 to 1441.    Dr. Rodman Pickle is Medical Director for Pulmonary Rehab at [facility].

## 2021-12-28 ENCOUNTER — Encounter (HOSPITAL_COMMUNITY): Payer: 59

## 2021-12-30 ENCOUNTER — Encounter (HOSPITAL_COMMUNITY)
Admission: RE | Admit: 2021-12-30 | Discharge: 2021-12-30 | Disposition: A | Discharge status: Home / Self Care | Payer: 59 | Source: Ambulatory Visit | Attending: Internal Medicine | Admitting: Internal Medicine

## 2021-12-30 DIAGNOSIS — J449 Chronic obstructive pulmonary disease, unspecified: Secondary | ICD-10-CM

## 2021-12-30 NOTE — Progress Notes (Signed)
Daily Session Note  Patient Details  Name: Jill Glenn MRN: [id number] Date of Birth: 04-28-1965 Referring Provider:   [name] Pulmonary Rehab Walk Test from 11/22/2021 in [facility] for Heart, Vascular, & [place]  Referring Provider Jill Glenn       Encounter Date: 12/30/2021  Check In:  Session Check In - 12/30/21 1702       Check-In   Supervising physician immediately available to respond to emergencies Jill Glenn    Physician(s) Erskine Emery    Location [facility]    Staff Present Maurice Small, RN, BSN;Lisa Ysidro Evert, RN;Milley Vining Yevonne Pax, ACSM-CEP, Exercise Physiologist;Jetta Walker BS, ACSM-CEP, Exercise Physiologist    Virtual Visit No    Medication changes reported     No    Fall or balance concerns reported    No    Tobacco Cessation No Change    Warm-up and Cool-down Performed as group-led instruction    Resistance Training Performed Yes    VAD Patient? No    PAD/SET Patient? No      Pain Assessment   Currently in Pain? No/denies             Capillary Blood Glucose: No results found for this or any previous visit (from the past 24 hour(s)).    Social History   Tobacco Use  Smoking Status Former   Packs/day: 1.00   Years: 40.00   Total pack years: 40.00   Types: Cigarettes   Start date: 03/05/1981   Quit date: 10/14/2021   Years since quitting: 0.2  Smokeless Tobacco Never    Goals Met:  Independence with exercise equipment Exercise tolerated well No report of concerns or symptoms today Strength training completed today  Goals Unmet:  Not Applicable  Comments: Service time is from 1330 to 1530.    Dr. Rodman Pickle is Medical Director for Pulmonary Rehab at [facility].

## 2022-01-04 ENCOUNTER — Encounter (HOSPITAL_COMMUNITY)
Admission: RE | Admit: 2022-01-04 | Discharge: 2022-01-04 | Disposition: A | Discharge status: Home / Self Care | Payer: 59 | Source: Ambulatory Visit | Attending: Internal Medicine | Admitting: Internal Medicine

## 2022-01-04 DIAGNOSIS — Z4889 Encounter for other specified surgical aftercare: Secondary | ICD-10-CM | POA: Diagnosis not present

## 2022-01-04 DIAGNOSIS — J449 Chronic obstructive pulmonary disease, unspecified: Secondary | ICD-10-CM | POA: Insufficient documentation

## 2022-01-04 NOTE — Progress Notes (Signed)
Daily Session Note  Patient Details  Name: Jill Glenn MRN: [id number] Date of Birth: [birth year] Referring Provider:   [name] Pulmonary Rehab Walk Test from 11/22/2021 in [facility] for Heart, Vascular, & [place]  Referring Provider [name]       Encounter Date: 01/04/2022  Check In:  Session Check In - 01/04/22 5621       Check-In   Supervising physician immediately available to respond to emergencies [facility] - Physician supervision    Physician(s) Dr. Gala Romney    Location [facility]    Staff Present Rodney Langton, RN;Mary Margette Fast, RN, BSN;Randi Olen Cordial BS, ACSM-CEP, Exercise Physiologist;Kaylee Rosana Hoes, MS, ACSM-CEP, Exercise Physiologist    Virtual Visit No    Medication changes reported     No    Fall or balance concerns reported    No    Tobacco Cessation No Change    Warm-up and Cool-down Performed as group-led instruction    Resistance Training Performed Yes    VAD Patient? No    PAD/SET Patient? No      Pain Assessment   Currently in Pain? No/denies    Multiple Pain Sites No             Capillary Blood Glucose: No results found for this or any previous visit (from the past 24 hour(s)).   Exercise Prescription Changes - 01/04/22 1500       Response to Exercise   Blood Pressure (Admit) 125/84    Blood Pressure (Exercise) 134/86    Blood Pressure (Exit) 106/78    Heart Rate (Admit) 72 bpm    Heart Rate (Exercise) 94 bpm    Heart Rate (Exit) 82 bpm    Oxygen Saturation (Admit) 100 %    Oxygen Saturation (Exercise) 94 %    Oxygen Saturation (Exit) 96 %    Rating of Perceived Exertion (Exercise) 13    Perceived Dyspnea (Exercise) 3    Duration Continue with 30 min of aerobic exercise without signs/symptoms of physical distress.    Intensity THRR unchanged      Progression   Progression Continue to progress workloads to maintain intensity without signs/symptoms of physical distress.      Resistance  Training   Reps 10-15    Time 10 Minutes      Recumbant Bike   Level 3    Minutes 15    METs 3.5      Recumbant Elliptical   Level 3    Minutes 15    METs 3.1             Social History   Tobacco Use  Smoking Status Former   Packs/day: 1.00   Years: 40.00   Total pack years: 40.00   Types: Cigarettes   Start date: 03/05/1981   Quit date: 10/14/2021   Years since quitting: 0.2  Smokeless Tobacco Never    Goals Met:  Proper associated with RPD/PD & O2 Sat Exercise tolerated well No report of concerns or symptoms today Strength training completed today  Goals Unmet:  Not Applicable  Comments: Service time is from 1318 to [place]    Dr. Rodman Pickle is Medical Director for Pulmonary Rehab at [facility].

## 2022-01-06 ENCOUNTER — Encounter (HOSPITAL_COMMUNITY)
Admission: RE | Admit: 2022-01-06 | Discharge: 2022-01-06 | Disposition: A | Discharge status: Home / Self Care | Payer: 59 | Source: Ambulatory Visit | Attending: Internal Medicine | Admitting: Internal Medicine

## 2022-01-06 DIAGNOSIS — J449 Chronic obstructive pulmonary disease, unspecified: Secondary | ICD-10-CM | POA: Diagnosis not present

## 2022-01-06 NOTE — Progress Notes (Signed)
Daily Session Note  Patient Details  Name: Jill Glenn MRN: [id number] Date of Birth: 07/25/1965 Referring Provider:   [name] Pulmonary Rehab Walk Test from 11/22/2021 in [facility] for Heart, Vascular, & [place]  Referring Provider [name]       Encounter Date: 01/06/2022  Check In:  Session Check In - 01/06/22 1524       Check-In   Supervising physician immediately available to respond to emergencies [facility] - Physician supervision    Physician(s) Dr. Gala Romney    Location [facility]    Staff Present Rodney Langton, RN;Mary Margette Fast, RN, BSN;Sayda Grable Olen Cordial BS, ACSM-CEP, Exercise Physiologist;Kaylee Rosana Hoes, MS, ACSM-CEP, Exercise Physiologist    Virtual Visit No    Medication changes reported     No    Fall or balance concerns reported    No    Tobacco Cessation No Change    Warm-up and Cool-down Performed as group-led instruction    Resistance Training Performed Yes    VAD Patient? No    PAD/SET Patient? No      Pain Assessment   Currently in Pain? No/denies    Pain Score 0-No pain    Multiple Pain Sites No             Capillary Blood Glucose: No results found for this or any previous visit (from the past 24 hour(s)).    Social History   Tobacco Use  Smoking Status Former   Packs/day: 1.00   Years: 40.00   Total pack years: 40.00   Types: Cigarettes   Start date: 03/05/1981   Quit date: 10/14/2021   Years since quitting: 0.2  Smokeless Tobacco Never    Goals Met:  Independence with exercise equipment Exercise tolerated well No report of concerns or symptoms today Strength training completed today  Goals Unmet:  Not Applicable  Comments: Pt declined coming to education today (goes to work after exercise). Service time is from 1321 to 1446.    Dr. Rodman Pickle is Medical Director for Pulmonary Rehab at [facility].

## 2022-01-11 ENCOUNTER — Encounter (HOSPITAL_COMMUNITY)
Admission: RE | Admit: 2022-01-11 | Discharge: 2022-01-11 | Disposition: A | Discharge status: Home / Self Care | Payer: 59 | Source: Ambulatory Visit | Attending: Internal Medicine | Admitting: Internal Medicine

## 2022-01-11 DIAGNOSIS — J449 Chronic obstructive pulmonary disease, unspecified: Secondary | ICD-10-CM

## 2022-01-11 NOTE — Progress Notes (Signed)
Daily Session Note  Patient Details  Name: Jill Glenn MRN: [id number] Date of Birth: Aug 07, 1965 Referring Provider:   [name] Pulmonary Rehab Walk Test from 11/22/2021 in [facility] for Heart, Vascular, & [place]  Referring Provider [name]       Encounter Date: 01/11/2022  Check In:  Session Check In - 01/11/22 1324       Check-In   Supervising physician immediately available to respond to emergencies [facility] MD immediately available    Physician(s) Mannam    Location [facility]    Staff Present Janine Ores, RN, BSN;Randi Olen Cordial BS, ACSM-CEP, Exercise Physiologist;Evy Lutterman Rosana Hoes, MS, ACSM-CEP, Exercise Physiologist;Samantha [place], RD, LDN    Virtual Visit No    Medication changes reported     No    Fall or balance concerns reported    No    Tobacco Cessation No Change    Warm-up and Cool-down Performed as group-led instruction    Resistance Training Performed Yes    VAD Patient? No    PAD/SET Patient? No      Pain Assessment   Currently in Pain? No/denies    Pain Score 0-No pain    Multiple Pain Sites No             Capillary Blood Glucose: No results found for this or any previous visit (from the past 24 hour(s)).    Social History   Tobacco Use  Smoking Status Former   Packs/day: 1.00   Years: 40.00   Total pack years: 40.00   Types: Cigarettes   Start date: 03/05/1981   Quit date: 10/14/2021   Years since quitting: 0.2  Smokeless Tobacco Never    Goals Met:  Proper associated with RPD/PD & O2 Sat Independence with exercise equipment Exercise tolerated well No report of concerns or symptoms today Strength training completed today  Goals Unmet:  Not Applicable  Comments: Service time is from 1317 to 1433.    Dr. Rodman Pickle is Medical Director for Pulmonary Rehab at [facility].

## 2022-01-13 ENCOUNTER — Encounter (HOSPITAL_COMMUNITY)
Admission: RE | Admit: 2022-01-13 | Discharge: 2022-01-13 | Disposition: A | Discharge status: Home / Self Care | Payer: 59 | Source: Ambulatory Visit | Attending: Internal Medicine | Admitting: Internal Medicine

## 2022-01-13 DIAGNOSIS — J449 Chronic obstructive pulmonary disease, unspecified: Secondary | ICD-10-CM | POA: Diagnosis not present

## 2022-01-13 NOTE — Progress Notes (Signed)
Daily Session Note  Patient Details  Name: Jill Glenn MRN: [id number] Date of Birth: Jul 23, 1965 Referring Provider:   [name] Pulmonary Rehab Walk Test from 11/22/2021 in [facility] for Heart, Vascular, & [place]  Referring Provider [name]       Encounter Date: 01/13/2022  Check In:  Session Check In - 01/13/22 1428       Check-In   Supervising physician immediately available to respond to emergencies [facility] MD immediately available    Physician(s) Mannam    Location [facility]    Staff Present Janine Ores, RN, BSN;Randi Reeve BS, ACSM-CEP, Exercise Physiologist;Ilina Xu Rosana Hoes, MS, ACSM-CEP, Exercise Physiologist;Samantha [place], RD, LDN;Jetta Walker BS, ACSM-CEP, Exercise Physiologist    Virtual Visit No    Medication changes reported     No    Fall or balance concerns reported    No    Tobacco Cessation No Change    Warm-up and Cool-down Performed as group-led instruction    Resistance Training Performed Yes    VAD Patient? No    PAD/SET Patient? No      Pain Assessment   Currently in Pain? No/denies    Pain Score 0-No pain    Multiple Pain Sites No             Capillary Blood Glucose: No results found for this or any previous visit (from the past 24 hour(s)).    Social History   Tobacco Use  Smoking Status Former   Packs/day: 1.00   Years: 40.00   Total pack years: 40.00   Types: Cigarettes   Start date: 03/05/1981   Quit date: 10/14/2021   Years since quitting: 0.2  Smokeless Tobacco Never    Goals Met:  Proper associated with RPD/PD & O2 Sat Exercise tolerated well No report of concerns or symptoms today Strength training completed today  Goals Unmet:  Not Applicable  Comments: Service time is from 1321 to 1443.    Dr. Rodman Pickle is Medical Director for Pulmonary Rehab at [facility]

## 2022-01-18 ENCOUNTER — Encounter (HOSPITAL_COMMUNITY)
Admission: RE | Admit: 2022-01-18 | Discharge: 2022-01-18 | Disposition: A | Discharge status: Home / Self Care | Payer: 59 | Source: Ambulatory Visit | Attending: Internal Medicine | Admitting: Internal Medicine

## 2022-01-18 DIAGNOSIS — J449 Chronic obstructive pulmonary disease, unspecified: Secondary | ICD-10-CM | POA: Diagnosis not present

## 2022-01-19 NOTE — Progress Notes (Signed)
Pulmonary Individual Treatment Plan  Patient Details  Name: Jill Glenn MRN: [id number] Date of Birth: [birth year] Referring Provider:   Doristine Devoid Pulmonary Rehab Walk Test from 11/22/2021 in [facility]  Referring Provider [name]       Initial Encounter Date:  Flowsheet Row Pulmonary Rehab Walk Test from 11/22/2021 in [facility]  Date 11/22/21       Visit Diagnosis: Stage 3 severe COPD by GOLD classification (HCC)  Patient's Home Medications on Admission:   Current Outpatient Medications:    acetaminophen (TYLENOL) 325 MG tablet, Take 2 tablets (650 mg total) by mouth every 6 (six) hours as needed for moderate pain, mild pain or headache., Disp: , Rfl:    albuterol (VENTOLIN HFA) 108 (90 Base) MCG/ACT inhaler, Inhale 2 puffs into the lungs every 6 (six) hours as needed for wheezing or shortness of breath., Disp: 18 g, Rfl: 3   Budeson-Glycopyrrol-Formoterol (BREZTRI AEROSPHERE) 160-9-4.8 MCG/ACT AERO, Inhale 2 puffs into the lungs in the morning and at bedtime., Disp: 1 each, Rfl: 5   Cranberry 500 MG TABS, Take 1 tablet by mouth daily as needed (uti prevention)., Disp: , Rfl:    guaiFENesin (MUCINEX) 600 MG 12 hr tablet, Take 600 mg by mouth 2 (two) times daily as needed for cough., Disp: , Rfl:    Multiple Vitamin (MULTIVITAMIN ADULT) TABS, Take 1 tablet by mouth daily., Disp: , Rfl:    varenicline (CHANTIX CONTINUING MONTH PAK) 1 MG tablet, Take 1 tablet (1 mg total) by mouth 2 (two) times daily., Disp: 60 tablet, Rfl: 5   vitamin C (ASCORBIC ACID) 500 MG tablet, Take 500 mg by mouth daily., Disp: , Rfl:   Past Medical History: Past Medical History:  Diagnosis Date   Asthma     Tobacco Use: Social History   Tobacco Use  Smoking Status Former   Packs/day: 1.00   Years: 40.00   Total pack years: 40.00   Types: Cigarettes   Start date:  03/05/1981   Quit date: 10/14/2021   Years since quitting: 0.2  Smokeless Tobacco Never    Labs: Review Flowsheet       Latest Ref Rng & Units 11/02/2020 12/21/2020  Labs for ITP Cardiac and Pulmonary Rehab  Cholestrol 0 - 200 mg/dL - 409   LDL (calc) 0 - 99 mg/dL - 91   HDL-C >81.19 mg/dL - 14.78   Trlycerides 0.0 - 149.0 mg/dL - 29.5   Bicarbonate 62.1 - 28.0 mmol/L 22.2  -  TCO2 22 - 32 mmol/L 23  -  Acid-base deficit 0.0 - 2.0 mmol/L 3.0  -  O2 Saturation % 90.0  -    Capillary Blood Glucose: No results found for: "GLUCAP"   Pulmonary Assessment Scores:  Pulmonary Assessment Scores     Row Name 11/22/21 1426         ADL UCSD   ADL Phase Entry     SOB Score total 45       CAT Score   CAT Score 24       mMRC Score   mMRC Score 2             UCSD: Self-administered rating of dyspnea associated with activities of daily living (ADLs) 6-point scale (0 = "not at all" to 5 = "maximal or unable to do because of breathlessness")  Scoring Scores range from 0 to 120.  Minimally  important difference is 5 units  CAT: CAT can identify the health impairment of COPD patients and is better correlated with disease progression.  CAT has a scoring range of zero to 40. The CAT score is classified into four groups of low (less than 10), medium (10 - 20), high (21-30) and very high (31-40) based on the impact level of disease on health status. A CAT score over 10 suggests significant symptoms.  A worsening CAT score could be explained by an exacerbation, poor medication adherence, poor inhaler technique, or progression of COPD or comorbid conditions.  CAT MCID is 2 points  mMRC: mMRC (Modified Medical Research Council) Dyspnea Scale is used to assess the degree of baseline functional disability in patients of respiratory disease due to dyspnea. No minimal important difference is established. A decrease in score of 1 point or greater is considered a positive change.   Pulmonary  Function Assessment:  Pulmonary Function Assessment - 11/22/21 1429       Breath   Bilateral Breath Sounds Decreased   diminished throughout   Shortness of Breath Yes;Limiting activity             Exercise Target Goals: Exercise Program Goal: Individual exercise prescription set using results from initial 6 min walk test and THRR while considering  patient's activity barriers and safety.   Exercise Prescription Goal: Initial exercise prescription builds to 30-45 minutes a day of aerobic activity, 2-3 days per week.  Home exercise guidelines will be given to patient during program as part of exercise prescription that the participant will acknowledge.  Activity Barriers & Risk Stratification:  Activity Barriers & Cardiac Risk Stratification - 11/22/21 1423       Activity Barriers & Cardiac Risk Stratification   Activity Barriers Deconditioning;Muscular Weakness;Shortness of Breath    Cardiac Risk Stratification Moderate             6 Minute Walk:  6 Minute Walk     Row Name 11/22/21 1526         6 Minute Walk   Phase Initial     Distance 1170 feet     Walk Time 6 minutes     # of Rest Breaks 0     MPH 2.22     METS 3.17     RPE 13     Perceived Dyspnea  2     VO2 Peak 11.09     Symptoms Yes (comment)     Comments SOB, practiced pursed lip breathing     Resting HR 70 bpm     Resting BP 104/70     Resting Oxygen Saturation  98 %     Exercise Oxygen Saturation  during 6 min walk 91 %     Max Ex. HR 80 bpm     Max Ex. BP 120/60     2 Minute Post BP 112/70       Interval HR   1 Minute HR 70     2 Minute HR 80     3 Minute HR 76     4 Minute HR 76     5 Minute HR 76     6 Minute HR 76     2 Minute Post HR 71     Interval Heart Rate? Yes       Interval Oxygen   Interval Oxygen? Yes     Baseline Oxygen Saturation % 98 %     1 Minute Oxygen Saturation % 95 %  1 Minute Liters of Oxygen 0 L     2 Minute Oxygen Saturation % 95 %     2 Minute Liters  of Oxygen 0 L     3 Minute Oxygen Saturation % 94 %     3 Minute Liters of Oxygen 0 L     4 Minute Oxygen Saturation % 91 %     4 Minute Liters of Oxygen 0 L     5 Minute Oxygen Saturation % 93 %     5 Minute Liters of Oxygen 0 L     6 Minute Oxygen Saturation % 93 %     6 Minute Liters of Oxygen 0 L     2 Minute Post Oxygen Saturation % 98 %     2 Minute Post Liters of Oxygen 0 L              Oxygen Initial Assessment:  Oxygen Initial Assessment - 11/24/21 1300       Home Oxygen   Home Oxygen Device None    Sleep Oxygen Prescription None    Home Exercise Oxygen Prescription None    Home Resting Oxygen Prescription None    Compliance with Home Oxygen Use Yes      Initial 6 min Walk   Oxygen Used None      Program Oxygen Prescription   Program Oxygen Prescription None      Intervention   Short Term Goals To learn and exhibit compliance with exercise, home and travel O2 prescription;To learn and understand importance of monitoring SPO2 with pulse oximeter and demonstrate accurate use of the pulse oximeter.;To learn and understand importance of maintaining oxygen saturations>88%;To learn and demonstrate proper pursed lip breathing techniques or other breathing techniques. ;To learn and demonstrate proper use of respiratory medications    Long  Term Goals Exhibits compliance with exercise, home  and travel O2 prescription;Verbalizes importance of monitoring SPO2 with pulse oximeter and return demonstration;Maintenance of O2 saturations>88%;Exhibits proper breathing techniques, such as pursed lip breathing or other method taught during program session;Compliance with respiratory medication;Demonstrates proper use of MDI's             Oxygen Re-Evaluation:  Oxygen Re-Evaluation     Row Name 12/21/21 0755 01/14/22 1516           Program Oxygen Prescription   Program Oxygen Prescription None None        Home Oxygen   Home Oxygen Device None None      Sleep Oxygen  Prescription None None      Home Exercise Oxygen Prescription None None      Home Resting Oxygen Prescription None None      Compliance with Home Oxygen Use Yes Yes        Goals/Expected Outcomes   Short Term Goals To learn and exhibit compliance with exercise, home and travel O2 prescription;To learn and understand importance of monitoring SPO2 with pulse oximeter and demonstrate accurate use of the pulse oximeter.;To learn and understand importance of maintaining oxygen saturations>88%;To learn and demonstrate proper pursed lip breathing techniques or other breathing techniques. ;To learn and demonstrate proper use of respiratory medications To learn and exhibit compliance with exercise, home and travel O2 prescription;To learn and understand importance of monitoring SPO2 with pulse oximeter and demonstrate accurate use of the pulse oximeter.;To learn and understand importance of maintaining oxygen saturations>88%;To learn and demonstrate proper pursed lip breathing techniques or other breathing techniques. ;To learn and demonstrate proper use of respiratory  medications      Long  Term Goals Exhibits compliance with exercise, home  and travel O2 prescription;Verbalizes importance of monitoring SPO2 with pulse oximeter and return demonstration;Maintenance of O2 saturations>88%;Exhibits proper breathing techniques, such as pursed lip breathing or other method taught during program session;Compliance with respiratory medication;Demonstrates proper use of MDI's Exhibits compliance with exercise, home  and travel O2 prescription;Verbalizes importance of monitoring SPO2 with pulse oximeter and return demonstration;Maintenance of O2 saturations>88%;Exhibits proper breathing techniques, such as pursed lip breathing or other method taught during program session;Compliance with respiratory medication;Demonstrates proper use of MDI's      Goals/Expected Outcomes Compliance and understanding of oxygen saturations  monitoring and breathing techniques to decrease shortness of breath. Compliance and understanding of oxygen saturations monitoring and breathing techniques to decrease shortness of breath.               Oxygen Discharge (Final Oxygen Re-Evaluation):  Oxygen Re-Evaluation - 01/14/22 1516       Program Oxygen Prescription   Program Oxygen Prescription None      Home Oxygen   Home Oxygen Device None    Sleep Oxygen Prescription None    Home Exercise Oxygen Prescription None    Home Resting Oxygen Prescription None    Compliance with Home Oxygen Use Yes      Goals/Expected Outcomes   Short Term Goals To learn and exhibit compliance with exercise, home and travel O2 prescription;To learn and understand importance of monitoring SPO2 with pulse oximeter and demonstrate accurate use of the pulse oximeter.;To learn and understand importance of maintaining oxygen saturations>88%;To learn and demonstrate proper pursed lip breathing techniques or other breathing techniques. ;To learn and demonstrate proper use of respiratory medications    Long  Term Goals Exhibits compliance with exercise, home  and travel O2 prescription;Verbalizes importance of monitoring SPO2 with pulse oximeter and return demonstration;Maintenance of O2 saturations>88%;Exhibits proper breathing techniques, such as pursed lip breathing or other method taught during program session;Compliance with respiratory medication;Demonstrates proper use of MDI's    Goals/Expected Outcomes Compliance and understanding of oxygen saturations monitoring and breathing techniques to decrease shortness of breath.             Initial Exercise Prescription:  Initial Exercise Prescription - 11/22/21 1500       Date of Initial Exercise RX and Referring Provider   Date 11/22/21    Referring Provider [name]    Expected Discharge Date 01/27/22      Recumbant Bike   Level 1    RPM 70    Minutes 15      Recumbant Elliptical   Level 1     RPM 70    Minutes 15      Prescription Details   Frequency (times per week) 2    Duration Progress to 30 minutes of continuous aerobic without signs/symptoms of physical distress      Intensity   THRR 40-80% of Max Heartrate 66-132    Ratings of Perceived Exertion 11-13    Perceived Dyspnea 0-4      Progression   Progression Continue progressive overload as per policy without signs/symptoms or physical distress.      Resistance Training   Training Prescription Yes    Weight blue bands    Reps 10-15             Perform Capillary Blood Glucose checks as needed.  Exercise Prescription Changes:   Exercise Prescription Changes     Row Name 12/16/21 1600 12/21/21  1352 12/22/21 1300 01/04/22 1500       Response to Exercise   Blood Pressure (Admit) -- 110/72 -- 125/84    Blood Pressure (Exercise) -- 119/86 -- 134/86    Blood Pressure (Exit) -- 112/82 -- 106/78    Heart Rate (Admit) -- 76 bpm -- 72 bpm    Heart Rate (Exercise) -- 117 bpm -- 94 bpm    Heart Rate (Exit) -- 82 bpm -- 82 bpm    Oxygen Saturation (Admit) -- 98 % -- 100 %    Oxygen Saturation (Exercise) -- 95 % -- 94 %    Oxygen Saturation (Exit) -- 94 % -- 96 %    Rating of Perceived Exertion (Exercise) -- 13 -- 13    Perceived Dyspnea (Exercise) -- 2 -- 3    Duration -- Continue with 30 min of aerobic exercise without signs/symptoms of physical distress. -- Continue with 30 min of aerobic exercise without signs/symptoms of physical distress.    Intensity -- THRR unchanged -- THRR unchanged      Progression   Progression -- Continue to progress workloads to maintain intensity without signs/symptoms of physical distress. -- Continue to progress workloads to maintain intensity without signs/symptoms of physical distress.      Resistance Training   Training Prescription -- Yes -- --    Weight -- blue bands -- --    Reps -- 10-15 -- 10-15    Time -- 10 Minutes -- 10 Minutes      Recumbant Bike   Level -- 3  -- 3    RPM -- 70 -- --    Minutes -- 3.7 -- 15    METs -- -- -- 3.5      Recumbant Elliptical   Level -- 3 -- 3    Minutes -- 4 -- 15    METs -- -- -- 3.1      Home Exercise Plan   Plans to continue exercise at Home (comment)  walking -- -- --    Frequency --  n/a, pt is walking most days -- -- --    Initial Home Exercises Provided 12/16/21 -- -- --             Exercise Comments:   Exercise Comments     Row Name 12/16/21 1619           Exercise Comments Discussed pts home exercise plan. She is currently walking 30-60 min 3-4 days a week on top of coming to [place]. No change needed, encouraged pt to continue.                Exercise Goals and Review:   Exercise Goals     Row Name 11/22/21 1519 11/24/21 1259 12/21/21 0732 01/14/22 1514       Exercise Goals   Increase Physical Activity Yes Yes Yes Yes    Intervention Provide advice, education, support and counseling about physical activity/exercise needs.;Develop an individualized exercise prescription for aerobic and resistive training based on initial evaluation findings, risk stratification, comorbidities and participant's personal goals. Provide advice, education, support and counseling about physical activity/exercise needs.;Develop an individualized exercise prescription for aerobic and resistive training based on initial evaluation findings, risk stratification, comorbidities and participant's personal goals. Provide advice, education, support and counseling about physical activity/exercise needs.;Develop an individualized exercise prescription for aerobic and resistive training based on initial evaluation findings, risk stratification, comorbidities and participant's personal goals. Provide advice, education, support and counseling about physical activity/exercise needs.;Develop an individualized  exercise prescription for aerobic and resistive training based on initial evaluation findings, risk stratification,  comorbidities and participant's personal goals.    Expected Outcomes Short Term: Attend rehab on a regular basis to increase amount of physical activity.;Long Term: Exercising regularly at least 3-5 days a week.;Long Term: Add in home exercise to make exercise part of routine and to increase amount of physical activity. Short Term: Attend rehab on a regular basis to increase amount of physical activity.;Long Term: Exercising regularly at least 3-5 days a week.;Long Term: Add in home exercise to make exercise part of routine and to increase amount of physical activity. Short Term: Attend rehab on a regular basis to increase amount of physical activity.;Long Term: Exercising regularly at least 3-5 days a week.;Long Term: Add in home exercise to make exercise part of routine and to increase amount of physical activity. Short Term: Attend rehab on a regular basis to increase amount of physical activity.;Long Term: Exercising regularly at least 3-5 days a week.;Long Term: Add in home exercise to make exercise part of routine and to increase amount of physical activity.    Increase Strength and Stamina Yes Yes Yes Yes    Intervention Provide advice, education, support and counseling about physical activity/exercise needs.;Develop an individualized exercise prescription for aerobic and resistive training based on initial evaluation findings, risk stratification, comorbidities and participant's personal goals. Provide advice, education, support and counseling about physical activity/exercise needs.;Develop an individualized exercise prescription for aerobic and resistive training based on initial evaluation findings, risk stratification, comorbidities and participant's personal goals. Provide advice, education, support and counseling about physical activity/exercise needs.;Develop an individualized exercise prescription for aerobic and resistive training based on initial evaluation findings, risk stratification,  comorbidities and participant's personal goals. Provide advice, education, support and counseling about physical activity/exercise needs.;Develop an individualized exercise prescription for aerobic and resistive training based on initial evaluation findings, risk stratification, comorbidities and participant's personal goals.    Expected Outcomes Short Term: Increase workloads from initial exercise prescription for resistance, speed, and METs.;Short Term: Perform resistance training exercises routinely during rehab and add in resistance training at home;Long Term: Improve cardiorespiratory fitness, muscular endurance and strength as measured by increased METs and functional capacity (6MWT) Short Term: Increase workloads from initial exercise prescription for resistance, speed, and METs.;Short Term: Perform resistance training exercises routinely during rehab and add in resistance training at home;Long Term: Improve cardiorespiratory fitness, muscular endurance and strength as measured by increased METs and functional capacity (6MWT) Short Term: Increase workloads from initial exercise prescription for resistance, speed, and METs.;Short Term: Perform resistance training exercises routinely during rehab and add in resistance training at home;Long Term: Improve cardiorespiratory fitness, muscular endurance and strength as measured by increased METs and functional capacity (6MWT) Short Term: Increase workloads from initial exercise prescription for resistance, speed, and METs.;Short Term: Perform resistance training exercises routinely during rehab and add in resistance training at home;Long Term: Improve cardiorespiratory fitness, muscular endurance and strength as measured by increased METs and functional capacity (6MWT)    Able to understand and use rate of perceived exertion (RPE) scale Yes Yes Yes Yes    Intervention Provide education and explanation on how to use RPE scale Provide education and explanation on  how to use RPE scale Provide education and explanation on how to use RPE scale Provide education and explanation on how to use RPE scale    Expected Outcomes Short Term: Able to use RPE daily in rehab to express subjective intensity level;Long Term:  Able to  use RPE to guide intensity level when exercising independently Short Term: Able to use RPE daily in rehab to express subjective intensity level;Long Term:  Able to use RPE to guide intensity level when exercising independently Short Term: Able to use RPE daily in rehab to express subjective intensity level;Long Term:  Able to use RPE to guide intensity level when exercising independently Short Term: Able to use RPE daily in rehab to express subjective intensity level;Long Term:  Able to use RPE to guide intensity level when exercising independently    Able to understand and use Dyspnea scale Yes Yes Yes Yes    Intervention Provide education and explanation on how to use Dyspnea scale Provide education and explanation on how to use Dyspnea scale Provide education and explanation on how to use Dyspnea scale Provide education and explanation on how to use Dyspnea scale    Expected Outcomes Short Term: Able to use Dyspnea scale daily in rehab to express subjective sense of shortness of breath during exertion;Long Term: Able to use Dyspnea scale to guide intensity level when exercising independently Short Term: Able to use Dyspnea scale daily in rehab to express subjective sense of shortness of breath during exertion;Long Term: Able to use Dyspnea scale to guide intensity level when exercising independently Short Term: Able to use Dyspnea scale daily in rehab to express subjective sense of shortness of breath during exertion;Long Term: Able to use Dyspnea scale to guide intensity level when exercising independently Short Term: Able to use Dyspnea scale daily in rehab to express subjective sense of shortness of breath during exertion;Long Term: Able to use Dyspnea  scale to guide intensity level when exercising independently    Knowledge and understanding of Target Heart Rate Range (THRR) Yes Yes Yes Yes    Intervention Provide education and explanation of THRR including how the numbers were predicted and where they are located for reference Provide education and explanation of THRR including how the numbers were predicted and where they are located for reference Provide education and explanation of THRR including how the numbers were predicted and where they are located for reference Provide education and explanation of THRR including how the numbers were predicted and where they are located for reference    Expected Outcomes Short Term: Able to state/look up THRR;Long Term: Able to use THRR to govern intensity when exercising independently;Short Term: Able to use daily as guideline for intensity in rehab Short Term: Able to state/look up THRR;Long Term: Able to use THRR to govern intensity when exercising independently;Short Term: Able to use daily as guideline for intensity in rehab Short Term: Able to state/look up THRR;Long Term: Able to use THRR to govern intensity when exercising independently;Short Term: Able to use daily as guideline for intensity in rehab Short Term: Able to state/look up THRR;Long Term: Able to use THRR to govern intensity when exercising independently;Short Term: Able to use daily as guideline for intensity in rehab    Understanding of Exercise Prescription Yes Yes Yes Yes    Intervention Provide education, explanation, and written materials on patient's individual exercise prescription Provide education, explanation, and written materials on patient's individual exercise prescription Provide education, explanation, and written materials on patient's individual exercise prescription Provide education, explanation, and written materials on patient's individual exercise prescription    Expected Outcomes Short Term: Able to explain program  exercise prescription;Long Term: Able to explain home exercise prescription to exercise independently Short Term: Able to explain program exercise prescription;Long Term: Able to explain home exercise  prescription to exercise independently Short Term: Able to explain program exercise prescription;Long Term: Able to explain home exercise prescription to exercise independently Short Term: Able to explain program exercise prescription;Long Term: Able to explain home exercise prescription to exercise independently             Exercise Goals Re-Evaluation :  Exercise Goals Re-Evaluation     Row Name 11/24/21 1259 12/21/21 0732 01/14/22 1514         Exercise Goal Re-Evaluation   Exercise Goals Review Increase Physical Activity;Increase Strength and Stamina;Able to understand and use rate of perceived exertion (RPE) scale;Able to understand and use Dyspnea scale;Knowledge and understanding of Target Heart Rate Range (THRR);Understanding of Exercise Prescription Increase Physical Activity;Increase Strength and Stamina;Able to understand and use rate of perceived exertion (RPE) scale;Able to understand and use Dyspnea scale;Knowledge and understanding of Target Heart Rate Range (THRR);Understanding of Exercise Prescription Increase Physical Activity;Increase Strength and Stamina;Able to understand and use rate of perceived exertion (RPE) scale;Able to understand and use Dyspnea scale;Knowledge and understanding of Target Heart Rate Range (THRR);Understanding of Exercise Prescription     Comments Pt will begin exercise next week. Will monitor for progression. Pt has completed 6 exercise sessions. She is motivated and has not missed sessions. She is exercising on the recumbent elliptical for 15 min at level 3, METs 2.9 (however was talking this day and has had 3.4 METs). Then she exercises on the recumbent bike for 15 min at level 3, METs 3.9. She performs warm up and cool down independently. She is exercising  at home.  Will monitor for continued progression. Pt has completed 13 exercise sessions. She is motivated and has not missed sessions. She is exercising on the recumbent elliptical for 15 min at level 4, METs 4.1. Then she exercises on the recumbent bike for 15 min at level 4, METs 5.0. She performs warm up and cool down independently. She is exercising at home.  We just added exercise sessions at pts request. Will discuss trying Scibike next session. Will monitor for continued progression.     Expected Outcomes Through exercise at rehab and home, the patient will decrease shortness of breath with daily activities and feel confident in carrying out an exercise regimen at home. Through exercise at rehab and home, the patient will decrease shortness of breath with daily activities and feel confident in carrying out an exercise regimen at home. Through exercise at rehab and home, the patient will decrease shortness of breath with daily activities and feel confident in carrying out an exercise regimen at home.              Discharge Exercise Prescription (Final Exercise Prescription Changes):  Exercise Prescription Changes - 01/04/22 1500       Response to Exercise   Blood Pressure (Admit) 125/84    Blood Pressure (Exercise) 134/86    Blood Pressure (Exit) 106/78    Heart Rate (Admit) 72 bpm    Heart Rate (Exercise) 94 bpm    Heart Rate (Exit) 82 bpm    Oxygen Saturation (Admit) 100 %    Oxygen Saturation (Exercise) 94 %    Oxygen Saturation (Exit) 96 %    Rating of Perceived Exertion (Exercise) 13    Perceived Dyspnea (Exercise) 3    Duration Continue with 30 min of aerobic exercise without signs/symptoms of physical distress.    Intensity THRR unchanged      Progression   Progression Continue to progress workloads to maintain intensity without signs/symptoms  of physical distress.      Resistance Training   Reps 10-15    Time 10 Minutes      Recumbant Bike   Level 3    Minutes 15     METs 3.5      Recumbant Elliptical   Level 3    Minutes 15    METs 3.1             Nutrition:  Target Goals: Understanding of nutrition guidelines, daily intake of sodium 1500mg , cholesterol 200mg , calories 30% from fat and 7% or less from saturated fats, daily to have 5 or more servings of fruits and vegetables.  Biometrics:  Pre Biometrics - 11/22/21 1529       Pre Biometrics   Grip Strength 26 kg              Nutrition Therapy Plan and Nutrition Goals:  Nutrition Therapy & Goals - 01/18/22 1518       Nutrition Therapy   Diet General Healthy Diet      Personal Nutrition Goals   Nutrition Goal Patient to improve dietary quality by using the plate method as a daily guide for meal planning to include lean protein/plant protein, fruits, vegetables, whole grains, and nonfat/low fat dairy as part of well balanced diet    Personal Goal #2 Patient to identify strategies for weight loss of 0.5-2.0# per week of weight loss.    Comments Patient remains precontemplative toward nutrition changes or referral to counseling for stress eating support. She reports over snacking and snacking in the middle of the night.She is up 8# since starting with our program and up 35# since March 2023. Jill Glenn would continue to benefit from weight loss and decrease intake of refined carbohydrates to support pulmonary disease.      Intervention Plan   Intervention Prescribe, educate and counsel regarding individualized specific dietary modifications aiming towards targeted core components such as weight, hypertension, lipid management, diabetes, heart failure and other comorbidities.;Nutrition handout(s) given to patient.    Expected Outcomes Short Term Goal: Understand basic principles of dietary content, such as calories, fat, sodium, cholesterol and nutrients.;Long Term Goal: Adherence to prescribed nutrition plan.             Nutrition Assessments:  MEDIFICTS Score Key: ?70 Need to  make dietary changes  40-70 Heart Healthy Diet ? 40 Therapeutic Level Cholesterol Diet   Picture Your Plate Scores: <49 Unhealthy dietary pattern with much room for improvement. 41-50 Dietary pattern unlikely to meet recommendations for good health and room for improvement. 51-60 More healthful dietary pattern, with some room for improvement.  >60 Healthy dietary pattern, although there may be some specific behaviors that could be improved.    Nutrition Goals Re-Evaluation:  Nutrition Goals Re-Evaluation     Row Name 11/30/21 1436 12/21/21 1533 01/18/22 1518         Goals   Current Weight 153 lb 10.6 oz (69.7 kg) 156 lb 8.4 oz (71 kg) 162 lb 0.6 oz (73.5 kg)     Comment labs WNL No new labs at this time. No new labs at this time.     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 She does the grocery shopping and cooking. She reports normal appetite and eats three meals per day. She does report eating in the middle of the night and some emotional type eating over the last 6+ months. She was using Chantix to  quit smoking. Her usual body weight is 128-130#; she is up ~25# over the last 11 months. 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 She does the grocery shopping and cooking. She reports normal appetite and eats three meals per day. She does report eating in the middle of the night and some emotional type eating over the last 6+ months. She was using Chantix to quit smoking. Her typical weight is 128-130#; she is up ~25# over the last 11 months.  Gave resources on COPD nutrition, strategies for weight loss, and mindful eating resources. Patient remains precontemplative toward nutrition changes. She reports over snacking and snacking in the middle of the night. She has been working on smoking cessation during this time. Jill Glenn declines referral to behavioral health/counseling to aid with emotional eating, reports  of depression, etc. She is up 2.6# since starting with our program and up 29.3# since March 2023. Jill Glenn would continue to benefit from weight loss and decreased intake of refined carbohydrates to support pulmonary disease. She does report good support from her husband and father. Patient remains precontemplative toward nutrition changes or referral to counseling for stress eating support. She reports over snacking and snacking in the middle of the night.She is up 8# since starting with our program and up 35# since March 2023. Jill Glenn would continue to benefit from weight loss and decrease intake of refined carbohydrates to support pulmonary disease.              Nutrition Goals Discharge (Final Nutrition Goals Re-Evaluation):  Nutrition Goals Re-Evaluation - 01/18/22 1518       Goals   Current Weight 162 lb 0.6 oz (73.5 kg)    Comment No new labs at this time.    Expected Outcome Patient remains precontemplative toward nutrition changes or referral to counseling for stress eating support. She reports over snacking and snacking in the middle of the night.She is up 8# since starting with our program and up 35# since March 2023. Jill Glenn would continue to benefit from weight loss and decrease intake of refined carbohydrates to support pulmonary disease.             Psychosocial: Target Goals: Acknowledge presence or absence of significant depression and/or stress, maximize coping skills, provide positive support system. Participant is able to verbalize types and ability to use techniques and skills needed for reducing stress and depression.  Initial Review & Psychosocial Screening:  Initial Psych Review & Screening - 11/22/21 1405       Initial Review   Current issues with Current Depression      Family Dynamics   Good Support System? Yes    Comments Husband, Jill Glenn, 2 daughters      Barriers   Psychosocial barriers to participate in program The patient should benefit from  training in stress management and relaxation.      Screening Interventions   Interventions Encouraged to exercise    Expected Outcomes Short Term goal: Utilizing psychosocial counselor, staff and physician to assist with identification of specific Stressors or current issues interfering with healing process. Setting desired goal for each stressor or current issue identified.;Long Term Goal: Stressors or current issues are controlled or eliminated.;Short Term goal: Identification and review with participant of any Quality of Life or Depression concerns found by scoring the questionnaire.;Long Term goal: The participant improves quality of Life and PHQ9 Scores as seen by post scores and/or verbalization of changes  Quality of Life Scores:  Scores of 19 and below usually indicate a poorer quality of life in these areas.  A difference of  2-3 points is a clinically meaningful difference.  A difference of 2-3 points in the total score of the Quality of Life Index has been associated with significant improvement in overall quality of life, self-image, physical symptoms, and general health in studies assessing change in quality of life.  PHQ-9: Review Flowsheet       11/22/2021 03/22/2021 12/21/2020  Depression screen PHQ 2/9  Decreased Interest 1 1 0  Down, Depressed, Hopeless 1 1 1   PHQ - 2 Score 2 2 1   Altered sleeping 2 3 -  Tired, decreased energy 1 3 -  Change in appetite 2 1 -  Feeling bad or failure about yourself  3 0 -  Trouble concentrating 0 0 -  Moving slowly or fidgety/restless 1 0 -  Suicidal thoughts 0 0 -  PHQ-9 Score 11 9 -  Difficult doing work/chores Somewhat difficult - -   Interpretation of Total Score  Total Score Depression Severity:  1-4 = Minimal depression, 5-9 = Mild depression, 10-14 = Moderate depression, 15-19 = Moderately severe depression, 20-27 = Severe depression   Psychosocial Evaluation and Intervention:  Psychosocial Evaluation -  11/22/21 1415       Psychosocial Evaluation & Interventions   Interventions Stress management education;Relaxation education;Encouraged to exercise with the program and follow exercise prescription    Comments Jill Glenn will benefit from exercise    Expected Outcomes For pt to participate in PR    Continue Psychosocial Services  Follow up required by staff             Psychosocial Re-Evaluation:  Psychosocial Re-Evaluation     Row Name 11/23/21 0954 12/22/21 1320 01/19/22 1034         Psychosocial Re-Evaluation   Current issues with Current Depression Current Depression Current Depression     Comments Jill Glenn has completed PR orientation and is starting the program 11/30/21. We will continue to monitor for psychosocail barriers. Jill Glenn admits that she has depression and was advised to follow up with counseling but, she has elected not to do this. This has not stopped her from attending the PR program. She states that she is enjoying the program and interacts with others in her class. We have provided her with a list of resources for seeking out a counselor. She is participating in the relaxation sessions during cool down. Jill Glenn is still stable with her depression. She revealed to staff that she was previously hospitalized due to her mental health. Jill Glenn feels that she can adequately monitor how she feels and is able to reach out for help if needed. She has good support from her husband and children who check on her as well. We recommend Jill Glenn to see a therapist, but she is still currently declini. Her diagnosis has not stopped Jill Glenn from coming or attending pulmonary rehab. Jill Glenn also disclosed to staff that her pulmonologist has spoken to her about the possibility of a lung transplant. She is an optimist outlook on her disease progression and is taking it "one day at a time". She states that she is enjoying the program and socializes with others in her class. We will continue to monitor and  assess Jill Glenn for any needs.     Expected Outcomes For her to participate in the program without any psychosocial issues or concerns. For her to attend the program without psychosocial issues or  concerns. For Jill Glenn to attend the program without psychosocial issues or concerns.     Interventions Stress management education;Relaxation education Encouraged to attend Pulmonary Rehabilitation for the exercise;Relaxation education;Stress management education Encouraged to attend Pulmonary Rehabilitation for the exercise;Relaxation education;Stress management education     Continue Psychosocial Services  No Follow up required No Follow up required Follow up required by staff              Psychosocial Discharge (Final Psychosocial Re-Evaluation):  Psychosocial Re-Evaluation - 01/19/22 1034       Psychosocial Re-Evaluation   Current issues with Current Depression    Comments Jill Glenn is still stable with her depression. She revealed to staff that she was previously hospitalized due to her mental health. Jill Glenn feels that she can adequately monitor how she feels and is able to reach out for help if needed. She has good support from her husband and children who check on her as well. We recommend Jill Glenn to see a therapist, but she is still currently declini. Her diagnosis has not stopped Jill Glenn from coming or attending pulmonary rehab. Kennetta also disclosed to staff that her pulmonologist has spoken to her about the possibility of a lung transplant. She is an optimist outlook on her disease progression and is taking it "one day at a time". She states that she is enjoying the program and socializes with others in her class. We will continue to monitor and assess Jill Glenn for any needs.    Expected Outcomes For Genoveva to attend the program without psychosocial issues or concerns.    Interventions Encouraged to attend Pulmonary Rehabilitation for the exercise;Relaxation education;Stress management education     Continue Psychosocial Services  Follow up required by staff             Education: Education Goals: Education classes will be provided on a weekly basis, covering required topics. Participant will state understanding/return demonstration of topics presented.  Learning Barriers/Preferences:  Learning Barriers/Preferences - 11/22/21 1415       Learning Barriers/Preferences   Learning Barriers Sight    Learning Preferences Audio;Group Instruction;Individual Instruction;Verbal Instruction;Video;Written Material             Education Topics: Introduction to Pulmonary Rehab Group instruction provided by PowerPoint, verbal discussion, and written material to support subject matter. Instructor reviews what Pulmonary Rehab is, the purpose of the program, and how patients are referred.     Know Your Numbers Group instruction that is supported by a PowerPoint presentation. Instructor discusses importance of knowing and understanding resting, exercise, and post-exercise oxygen saturation, heart rate, and blood pressure. Oxygen saturation, heart rate, blood pressure, rating of perceived exertion, and dyspnea are reviewed along with a normal range for these values.    Exercise for the Pulmonary Patient Group instruction that is supported by a PowerPoint presentation. Instructor discusses benefits of exercise, core components of exercise, frequency, duration, and intensity of an exercise routine, importance of utilizing pulse oximetry during exercise, safety while exercising, and options of places to exercise outside of rehab.  Flowsheet Row PULMONARY REHAB CHRONIC OBSTRUCTIVE PULMONARY DISEASE from 12/30/2021 in [facility]  Date 12/30/21  Educator EP  Instruction Review Code 1- Verbalizes Understanding          MET Level  Group instruction provided by PowerPoint, verbal discussion, and written material to support subject  matter. Instructor reviews what METs are and how to increase METs.    Pulmonary Medications Verbally interactive group  education provided by instructor with focus on inhaled medications and proper administration.   Anatomy and Physiology of the Respiratory System Group instruction provided by PowerPoint, verbal discussion, and written material to support subject matter. Instructor reviews respiratory cycle and anatomical components of the respiratory system and their functions. Instructor also reviews differences in obstructive and restrictive respiratory diseases with examples of each.    Oxygen Safety Group instruction provided by PowerPoint, verbal discussion, and written material to support subject matter. There is an overview of "What is Oxygen" and "Why do we need it".  Instructor also reviews how to create a safe environment for oxygen use, the importance of using oxygen as prescribed, and the risks of noncompliance. There is a brief discussion on traveling with oxygen and resources the patient may utilize.   Oxygen Use Group instruction provided by PowerPoint, verbal discussion, and written material to discuss how supplemental oxygen is prescribed and different types of oxygen supply systems. Resources for more information are provided.    Breathing Techniques Group instruction that is supported by demonstration and informational handouts. Instructor discusses the benefits of pursed lip and diaphragmatic breathing and detailed demonstration on how to perform both.  Flowsheet Row PULMONARY REHAB CHRONIC OBSTRUCTIVE PULMONARY DISEASE from 12/30/2021 in [facility]  Date 12/02/21  Educator EP  Instruction Review Code 1- Verbalizes Understanding        Risk Factor Reduction Group instruction that is supported by a PowerPoint presentation. Instructor discusses the definition of a risk factor, different risk factors for  pulmonary disease, and how the heart and lungs work together. Flowsheet Row PULMONARY REHAB CHRONIC OBSTRUCTIVE PULMONARY DISEASE from 12/30/2021 in [facility]  Date 12/16/21  Educator EP  Instruction Review Code 1- Verbalizes Understanding       MD Day A group question and answer session with a medical doctor that allows participants to ask questions that relate to their pulmonary disease state.   Nutrition for the Pulmonary Patient Group instruction provided by PowerPoint slides, verbal discussion, and written materials to support subject matter. The instructor gives an explanation and review of healthy diet recommendations, which includes a discussion on weight management, recommendations for fruit and vegetable consumption, as well as protein, fluid, caffeine, fiber, sodium, sugar, and alcohol. Tips for eating when patients are short of breath are discussed.    Other Education Group or individual verbal, written, or video instructions that support the educational goals of the pulmonary rehab program. Flowsheet Row PULMONARY REHAB CHRONIC OBSTRUCTIVE PULMONARY DISEASE from 12/30/2021 in [facility]  Date 12/09/21  [Stress and Energy]  Educator EP  Instruction Review Code 1- Verbalizes Understanding        Knowledge Questionnaire Score:  Knowledge Questionnaire Score - 11/22/21 1416       Knowledge Questionnaire Score   Pre Score 17/18             Core Components/Risk Factors/Patient Goals at Admission:  Personal Goals and Risk Factors at Admission - 11/22/21 1419       Core Components/Risk Factors/Patient Goals on Admission    Weight Management Weight Loss    Tobacco Cessation Yes    Number of packs per day only occasional puff currently    Intervention Offer self-teaching materials, assist with locating and accessing local/national Quit Smoking  programs, and support quit date choice.;Assist the participant in steps  to quit. Provide individualized education and counseling about committing to Tobacco Cessation, relapse prevention, and pharmacological support that can be provided by physician.    Expected Outcomes Short Term: Will quit all tobacco product use, adhering to prevention of relapse plan.;Long Term: Complete abstinence from all tobacco products for at least 12 months from quit date.    Improve shortness of breath with ADL's Yes    Intervention Provide education, individualized exercise plan and daily activity instruction to help decrease symptoms of SOB with activities of daily living.    Expected Outcomes Short Term: Improve cardiorespiratory fitness to achieve a reduction of symptoms when performing ADLs;Long Term: Be able to perform more ADLs without symptoms or delay the onset of symptoms             Core Components/Risk Factors/Patient Goals Review:   Goals and Risk Factor Review     Row Name 11/24/21 1043 12/22/21 1327 01/19/22 1049         Core Components/Risk Factors/Patient Goals Review   Personal Goals Review Weight Management/Obesity;Tobacco Cessation;Improve shortness of breath with ADL's;Develop more efficient breathing techniques such as purse lipped breathing and diaphragmatic breathing and practicing self-pacing with activity. Weight Management/Obesity;Tobacco Cessation;Improve shortness of breath with ADL's;Develop more efficient breathing techniques such as purse lipped breathing and diaphragmatic breathing and practicing self-pacing with activity. Weight Management/Obesity;Tobacco Cessation;Improve shortness of breath with ADL's;Develop more efficient breathing techniques such as purse lipped breathing and diaphragmatic breathing and practicing self-pacing with activity.     Review Jill Glenn is starting the program 11/30/21 at 1:15 pm. We will continue to monitor her progression towards her core components.  Jill Glenn's weights have been stable no loss or gains. She has attended the breathing techniques education. No change in her tobacco use occasional puffs. She is exercising on the octane and recumbent bike. She has had increasung workloads and METS on both. She is reporting mild to mild with difficulity SOB and her oxygen saturation have been 93-96% with exercising. Jill Glenn has had perfect attendance since starting the program and has completed 14 sessions. Despite working full time Jill Glenn has arranged to attend PR during her lunch break. Jill Glenn is currently exercising on the recumbent elliptical and the recumbent bike. She has increased both her workload and her METS. She is independently using pursed lip breathing when she gets short of breath and can verbalize her rate of perceived exertion and dyspnea scale to staff. She is maintaining her oxygen saturation on room air from the mid to high 90s. Jill Glenn has gained weight since starting PR. She is working with our dietitian to lose weight. She had acknowledged that she eats when she is stressed. Having recently quit smoking, she states she has turned to food. We will continue to assess, educated and monitor Kamalani and her goals in PR class.     Expected Outcomes See goals admission goals See admission goals. See admission goals.              Core Components/Risk Factors/Patient Goals at Discharge (Final Review):   Goals and Risk Factor Review - 01/19/22 1049       Core Components/Risk Factors/Patient Goals Review   Personal Goals Review Weight Management/Obesity;Tobacco Cessation;Improve shortness of breath with ADL's;Develop more efficient breathing techniques such as purse lipped breathing and diaphragmatic breathing and practicing self-pacing with activity.    Review Jill Glenn has had perfect attendance since starting the program and has completed 14 sessions. Despite working full time Jill Glenn has arranged to attend PR during her  lunch break. Jill Glenn is  currently exercising on the recumbent elliptical and the recumbent bike. She has increased both her workload and her METS. She is independently using pursed lip breathing when she gets short of breath and can verbalize her rate of perceived exertion and dyspnea scale to staff. She is maintaining her oxygen saturation on room air from the mid to high 90s. Jill Glenn has gained weight since starting PR. She is working with our dietitian to lose weight. She had acknowledged that she eats when she is stressed. Having recently quit smoking, she states she has turned to food. We will continue to assess, educated and monitor Jill Glenn and her goals in PR class.    Expected Outcomes See admission goals.             ITP Comments:   Comments: Pt is making expected progress toward Pulmonary Rehab goals after completing 25 sessions. Recommend continued exercise, life style modification, education, and utilization of breathing techniques to increase stamina and strength, while also decreasing shortness of breath with exertion.  Dr. Mechele Collin is Medical Director for Pulmonary Rehab at [facility].

## 2022-01-20 ENCOUNTER — Encounter (HOSPITAL_COMMUNITY)
Admission: RE | Admit: 2022-01-20 | Discharge: 2022-01-20 | Disposition: A | Discharge status: Home / Self Care | Payer: 59 | Source: Ambulatory Visit | Attending: Internal Medicine | Admitting: Internal Medicine

## 2022-01-20 DIAGNOSIS — J449 Chronic obstructive pulmonary disease, unspecified: Secondary | ICD-10-CM

## 2022-01-20 NOTE — Progress Notes (Signed)
Daily Session Note  Patient Details  Name: Jill Glenn MRN: [id number] Date of Birth: [birth year] Referring Provider:   [name] Pulmonary Rehab Walk Test from 11/22/2021 in [facility] for Heart, Vascular, & [place]  Referring Provider [name]       Encounter Date: 01/20/2022  Check In:  Session Check In - 01/20/22 1523       Check-In   Supervising physician immediately available to respond to emergencies [facility] MD immediately available    Physician(s) [name]    Location [facility]    Staff Present Janine Ores, RN, BSN;Randi Olen Cordial BS, ACSM-CEP, Exercise Physiologist;Kaylee Rosana Hoes, MS, ACSM-CEP, Exercise Physiologist;Other    Virtual Visit No    Medication changes reported     No    Fall or balance concerns reported    No    Tobacco Cessation No Change    Warm-up and Cool-down Performed as group-led instruction    Resistance Training Performed Yes    VAD Patient? No    PAD/SET Patient? No      Pain Assessment   Currently in Pain? No/denies    Pain Score 0-No pain    Multiple Pain Sites No             Capillary Blood Glucose: No results found for this or any previous visit (from the past 24 hour(s)).    Social History   Tobacco Use  Smoking Status Former   Packs/day: 1.00   Years: 40.00   Total pack years: 40.00   Types: Cigarettes   Start date: 03/05/1981   Quit date: 10/14/2021   Years since quitting: 0.2  Smokeless Tobacco Never    Goals Met:  Independence with exercise equipment Improved SOB with ADL's Exercise tolerated well Strength training completed today  Goals Unmet:  Not Applicable  Comments: Service time is from 1317 to 1500.    Dr. Rodman Pickle is Medical Director for Pulmonary Rehab at [facility].

## 2022-01-25 ENCOUNTER — Encounter (HOSPITAL_COMMUNITY)
Admission: RE | Admit: 2022-01-25 | Discharge: 2022-01-25 | Disposition: A | Discharge status: Home / Self Care | Payer: 59 | Source: Ambulatory Visit | Attending: Internal Medicine | Admitting: Internal Medicine

## 2022-01-25 DIAGNOSIS — J449 Chronic obstructive pulmonary disease, unspecified: Secondary | ICD-10-CM | POA: Diagnosis not present

## 2022-01-25 NOTE — Progress Notes (Signed)
Daily Session Note  Patient Details  Name: Jill Glenn MRN: [id number] Date of Birth: 1965/05/10 Referring Provider:   [name] Pulmonary Rehab Walk Test from 11/22/2021 in [facility] for Heart, Vascular, & [facility]  Referring Provider [name]       Encounter Date: 01/25/2022  Check In:  Session Check In - 01/25/22 1422       Check-In   Physician(s) Anselm Lis, NP    [facility]    Staff Present Janine Ores, RN, BSN;Randi Olen Cordial BS, ACSM-CEP, Exercise Physiologist;Kaylee Rosana Hoes, MS, ACSM-CEP, Exercise Physiologist;Samantha [place], RD, LDN;Other    Virtual Visit No    Medication changes reported     No    Fall or balance concerns reported    No    Tobacco Cessation No Change    Warm-up and Cool-down Performed as group-led instruction    Resistance Training Performed Yes    VAD Patient? No    PAD/SET Patient? No      Pain Assessment   Currently in Pain? No/denies    Pain Score 4     Pain Location Thoracic    Pain Descriptors / Indicators Aching    Pain Type Chronic pain    Pain Onset Today    Pain Frequency Intermittent    Multiple Pain Sites No             Capillary Blood Glucose: No results found for this or any previous visit (from the past 24 hour(s)).    Social History   Tobacco Use  Smoking Status Former   Packs/day: 1.00   Years: 40.00   Total pack years: 40.00   Types: Cigarettes   Start date: 03/05/1981   Quit date: 10/14/2021   Years since quitting: 0.2  Smokeless Tobacco Never    Goals Met:  Proper associated with RPD/PD & O2 Sat Independence with exercise equipment Exercise tolerated well No report of concerns or symptoms today Strength training completed today  Goals Unmet:  Not Applicable  Comments: Service time is from 1320 to 1443.    Dr. Rodman Pickle is Medical Director for Pulmonary Rehab at [facility].

## 2022-01-27 ENCOUNTER — Encounter (HOSPITAL_COMMUNITY)
Admission: RE | Admit: 2022-01-27 | Discharge: 2022-01-27 | Disposition: A | Discharge status: Home / Self Care | Payer: 59 | Source: Ambulatory Visit | Attending: Internal Medicine | Admitting: Internal Medicine

## 2022-01-27 DIAGNOSIS — J449 Chronic obstructive pulmonary disease, unspecified: Secondary | ICD-10-CM | POA: Diagnosis not present

## 2022-01-27 NOTE — Progress Notes (Signed)
Daily Session Note  Patient Details  Name: Jill Glenn MRN: [id number] Date of Birth: [birth year] Referring Provider:   [name] Pulmonary Rehab Walk Test from 11/22/2021 in [facility] for Heart, Vascular, & Lung [facility]  Referring Provider Jill Glenn       Encounter Date: 01/27/2022  Check In:  Session Check In - 01/27/22 1324       Check-In   Supervising physician immediately available to respond to emergencies [facility] MD immediately available    Physician(s) Dr. Marshell Garfinkel    [facility]    Staff Present Jill Ores, RN, Jill Ore, MS, ACSM-CEP, Exercise Physiologist;Jill Glenn, ACSM-CEP, Exercise Physiologist;Other    Virtual Visit No    Medication changes reported     No    Fall or balance concerns reported    No    Tobacco Cessation No Change    Warm-up and Cool-down Performed as group-led instruction    Resistance Training Performed Yes    VAD Patient? No    PAD/SET Patient? No      Pain Assessment   Currently in Pain? No/denies    Pain Score 0-No pain    Multiple Pain Sites No             Capillary Blood Glucose: No results found for this or any previous visit (from the past 24 hour(s)).    Social History   Tobacco Use  Smoking Status Former   Packs/day: 1.00   Years: 40.00   Total pack years: 40.00   Types: Cigarettes   Start date: 03/05/1981   Quit date: 10/14/2021   Years since quitting: 0.2  Smokeless Tobacco Never    Goals Met:  Proper associated with RPD/PD & O2 Sat Independence with exercise equipment Exercise tolerated well Strength training completed today  Goals Unmet:  Not Applicable  Comments: Service time is from 1316 to 1445.    Dr. Rodman Pickle is Medical Director for Pulmonary Rehab at [facility].

## 2022-02-01 ENCOUNTER — Encounter (HOSPITAL_COMMUNITY)
Admission: RE | Admit: 2022-02-01 | Discharge: 2022-02-01 | Disposition: A | Discharge status: Home / Self Care | Payer: 59 | Source: Ambulatory Visit | Attending: Internal Medicine | Admitting: Internal Medicine

## 2022-02-01 ENCOUNTER — Encounter (HOSPITAL_COMMUNITY): Payer: Self-pay

## 2022-02-01 VITALS — Wt 160.1 lb

## 2022-02-01 DIAGNOSIS — J449 Chronic obstructive pulmonary disease, unspecified: Secondary | ICD-10-CM | POA: Diagnosis not present

## 2022-02-01 NOTE — Progress Notes (Signed)
Daily Session Note  Patient Details  Name: Jill Glenn MRN: [id number] Date of Birth: Jun 05, 1965 Referring Provider:   [name] Pulmonary Rehab Walk Test from 11/22/2021 in [facility] for Heart, Vascular, & Lung Health  Referring Provider [name]       Encounter Date: 02/01/2022  Check In:  Session Check In - 02/01/22 1434       Check-In   Supervising physician immediately available to respond to emergencies [facility] MD immediately available    Physician(s) Dr. Marshell Garfinkel    [facility]    Staff Present Janine Ores, RN, Quentin Ore, MS, ACSM-CEP, Exercise Physiologist;Randi Yevonne Pax, ACSM-CEP, Exercise Physiologist;Other;Samantha [place], [place], LDN    Virtual Visit No    Medication changes reported     No    Fall or balance concerns reported    No    Tobacco Cessation No Change    Warm-up and Cool-down Performed as group-led instruction    Resistance Training Performed Yes    VAD Patient? No    PAD/SET Patient? No      Pain Assessment   Currently in Pain? No/denies    Pain Score 0-No pain    Multiple Pain Sites No             Capillary Blood Glucose: No results found for this or any previous visit (from the past 24 hour(s)).   Exercise Prescription Changes - 02/01/22 1500       Response to Exercise   Blood Pressure (Admit) 110/70    Blood Pressure (Exercise) 120/80    Blood Pressure (Exit) 131/80    Heart Rate (Admit) 86 bpm    Heart Rate (Exercise) 101 bpm    Heart Rate (Exit) 85 bpm    Oxygen Saturation (Admit) 98 %    Oxygen Saturation (Exercise) 95 %    Oxygen Saturation (Exit) 98 %    Rating of Perceived Exertion (Exercise) 13    Perceived Dyspnea (Exercise) 2    Duration Continue with 30 min of aerobic exercise without signs/symptoms of physical distress.    Intensity THRR unchanged      Progression   Progression Continue to progress workloads to maintain intensity without signs/symptoms  of physical distress.      Resistance Training   Training Prescription Yes    Weight blue bands    Reps 10-15    Time 10 Minutes      Bike   Level 5    Minutes 15    METs 3.7      Recumbant Elliptical   Level 5    RPM 70    Minutes 15    METs 2.7             Social History   Tobacco Use  Smoking Status Former   Packs/day: 1.00   Years: 40.00   Total pack years: 40.00   Types: Cigarettes   Start date: 03/05/1981   Quit date: 10/14/2021   Years since quitting: 0.3  Smokeless Tobacco Never    Goals Met:  Independence with exercise equipment Using PLB without cueing & demonstrates good technique Exercise tolerated well No report of concerns or symptoms today Strength training completed today  Goals Unmet:  Not Applicable  Comments: Service time is from [place] to [place]    Dr. Rodman Pickle is Medical Director for Pulmonary Rehab at [facility].

## 2022-02-02 ENCOUNTER — Encounter: Payer: Self-pay | Admitting: Nurse Practitioner

## 2022-02-02 ENCOUNTER — Ambulatory Visit (INDEPENDENT_AMBULATORY_CARE_PROVIDER_SITE_OTHER): Payer: 59 | Admitting: Nurse Practitioner

## 2022-02-02 VITALS — BP 128/74 | HR 73 | Ht 63.0 in | Wt 160.6 lb

## 2022-02-02 DIAGNOSIS — J449 Chronic obstructive pulmonary disease, unspecified: Secondary | ICD-10-CM | POA: Diagnosis not present

## 2022-02-02 DIAGNOSIS — F17200 Nicotine dependence, unspecified, uncomplicated: Secondary | ICD-10-CM

## 2022-02-02 NOTE — Assessment & Plan Note (Signed)
Quit smoking 10/2021.  Undergoing annual LDCT chest with lung cancer screening program.  Next scan anticipated 09/2022.

## 2022-02-02 NOTE — Progress Notes (Signed)
@Patient  ID: Jill Glenn, female    DOB: [birth year], 57 y.o.   MRN: [id number]  Chief Complaint  Patient presents with   Follow-up    Pt f/u she is feeling okay, she has some SOB but states that is her baseline especially when it's cold.     Referring provider: No ref. provider found  HPI: 57 year old female, active smoker followed for COPD with chronic bronchitis and emphysema. She is a patient of Dr. Mauricio Po and last seen in office on 11/04/2021 by [facility] NP. Past medical history significant for chronic cough.  TEST/EVENTS:  09/27/2021 lung cancer screen CT: no LAD. There is biapical pleuroparenchymal scarring. Centrilobular and bullous paraseptal emphysema. Calcified granulomas. There is a 7.7 mm LLL nodule, unchanged. Lung RADS 2. Low attenuation lesion in the hepatic lobe; too small to characterize but likely benign  10/15/2021 PFTs: FVC 58, FEV1 24, ratio 41, TLC 134, DLCOcor 58. Positive BD response with 24% change.   10/14/2021: OV with Dr. [name] for pulmonary consult. DOE; worse over the past few months. Has to pause during ADLs to catch her breath. Early morning cough; trouble producing sputum. She has chest tightness and wheezing. She was previously prescribed Trelegy but it was expensive with old insurance. She's been hospitalized numerous times for pneumonia/COPD exacerbation; most recently January 2022 and October 2022. No prednisone since Jan 2023. Uses rescue 1-2 times a day. Continues to smoke; tried Wellbutrin in past. Started on [place]. Chantix for smoking cessation. Ordered full PFTs. Needs repeat CT in 09/2022 for lung cancer screening.   11/04/2021: OV with Andjela Wickes NP for follow up after PFTs which were consistent with very severe obstruction with reversibility. She has been feeling better since she was here last. She quit smoking on 10/12, after she was here. Feels like the Judithann Sauger has helped her; she doesn't have much chest tightness or wheezing. Activity is still limited  by her shortness of breath but she has noticed that she can walk further now that she's stopped smoking. She has an occasional cough in AM. No increased sputum production, fevers, chills, hemoptysis, weight loss. She rarely uses albuterol. She is still taking Chantix; unsure if she has refills to continue.  02/02/2022: Today - follow up Patient presents today for 19-month follow-up.  She has been doing pretty well since she was here last.  Feels like her breathing is stable.  She does tend to have more trouble when it is cold outside or rainy.  Using her rescue inhaler 2-3 times a week.  Morning cough has improved since she quit smoking.  She was on Chantix previously but insurance would not cover this so she stopped it.  She denies any chest congestion or wheezing.  Using [place] twice daily.  No concerns or complaints today.  Attending pulmonary rehab.  They plan to extend her by another 4 weeks.  Allergies  Allergen Reactions   Codeine     Immunization History  Administered Date(s) Administered   Influenza,inj,Quad PF,6+ Mos 12/21/2020, 10/14/2021   PNEUMOCOCCAL CONJUGATE-20 12/21/2020   Tdap 03/22/2021   Zoster Recombinat (Shingrix) 03/22/2021    Past Medical History:  Diagnosis Date   Asthma     Tobacco History: Social History   Tobacco Use  Smoking Status Former   Packs/day: 1.00   Years: 40.00   Total pack years: 40.00   Types: Cigarettes   Start date: 03/05/1981   Quit date: 10/14/2021   Years since quitting: 0.3  Smokeless Tobacco Never  Counseling given: Not Answered   Outpatient Medications Prior to Visit  Medication Sig Dispense Refill   acetaminophen (TYLENOL) 325 MG tablet Take 2 tablets (650 mg total) by mouth every 6 (six) hours as needed for moderate pain, mild pain or headache.     albuterol (VENTOLIN HFA) 108 (90 Base) MCG/ACT inhaler Inhale 2 puffs into the lungs every 6 (six) hours as needed for wheezing or shortness of breath. 18 g 3    Budeson-Glycopyrrol-Formoterol (BREZTRI AEROSPHERE) 160-9-4.8 MCG/ACT AERO Inhale 2 puffs into the lungs in the morning and at bedtime. 1 each 5   Cranberry 500 MG TABS Take 1 tablet by mouth daily as needed (uti prevention).     Multiple Vitamin (MULTIVITAMIN ADULT) TABS Take 1 tablet by mouth daily.     varenicline (CHANTIX CONTINUING MONTH PAK) 1 MG tablet Take 1 tablet (1 mg total) by mouth 2 (two) times daily. 60 tablet 5   vitamin C (ASCORBIC ACID) 500 MG tablet Take 500 mg by mouth daily.     guaiFENesin (MUCINEX) 600 MG 12 hr tablet Take 600 mg by mouth 2 (two) times daily as needed for cough.     No facility-administered medications prior to visit.     Review of Systems:   Constitutional: No weight loss or gain, night sweats, fevers, chills, fatigue, or lassitude. HEENT: No headaches, difficulty swallowing, tooth/dental problems, or sore throat. No sneezing, itching, ear ache, nasal congestion, or post nasal drip CV:  No chest pain, orthopnea, PND, swelling in lower extremities, anasarca, dizziness, palpitations, syncope Resp: +shortness of breath with exertion; occasional AM cough. No excess mucus or change in color of mucus. No hemoptysis. No wheezing.  No chest wall deformity GI:  No heartburn, indigestion, abdominal pain, nausea, vomiting, diarrhea, change in bowel habits, loss of appetite, bloody stools.  MSK:  No joint pain or swelling.  No decreased range of motion.  No back pain. Neuro: No dizziness or lightheadedness.  Psych: No depression or anxiety. Mood stable.     Physical Exam:  BP 128/74   Pulse 73   Ht 5\' 3"  (1.6 m)   Wt 160 lb 9.6 oz (72.8 kg)   SpO2 99%   BMI 28.45 kg/m   GEN: Pleasant, interactive, well-appearing; in no acute distress. HEENT:  Normocephalic and atraumatic. PERRLA. Sclera white. Nasal turbinates pink, moist and patent bilaterally. No rhinorrhea present. Oropharynx pink and moist, without exudate or edema. No lesions, ulcerations, or  postnasal drip.  NECK:  Supple w/ fair ROM. No JVD present. Normal carotid impulses w/o bruits. Thyroid symmetrical with no goiter or nodules palpated. No lymphadenopathy.   CV: RRR, no m/r/g, no peripheral edema. Pulses intact, +2 bilaterally. No cyanosis, pallor or clubbing. PULMONARY:  Unlabored, regular breathing. Diminished bilaterally A&P w/o wheezes/rales/rhonchi. No accessory muscle use.  GI: BS present and normoactive. Soft, non-tender to palpation. No organomegaly or masses detected. MSK: No erythema, warmth or tenderness. Cap refil <2 sec all extrem. No deformities or joint swelling noted.  Neuro: A/Ox3. No focal deficits noted.   Skin: Warm, no lesions or rashe Psych: Normal affect and behavior. Judgement and thought content appropriate.     Lab Results:  CBC    Component Value Date/Time   WBC 8.3 11/02/2020 0750   RBC 4.81 11/02/2020 0750   HGB 14.6 11/02/2020 1246   HCT 43.0 11/02/2020 1246   PLT 338 11/02/2020 0750   MCV 90.2 11/02/2020 0750   MCH 29.5 11/02/2020 0750   MCHC 32.7 11/02/2020  0750   RDW 13.4 11/02/2020 0750   LYMPHSABS 2.2 11/02/2020 0750   MONOABS 0.3 11/02/2020 0750   EOSABS 0.2 11/02/2020 0750   BASOSABS 0.1 11/02/2020 0750    BMET    Component Value Date/Time   NA 137 11/03/2020 0517   K 4.1 11/03/2020 0517   CL 105 11/03/2020 0517   CO2 24 11/03/2020 0517   GLUCOSE 94 11/03/2020 0517   BUN 10 11/03/2020 0517   CREATININE 0.71 11/03/2020 0517   CALCIUM 9.4 11/03/2020 0517   GFRNONAA >60 11/03/2020 0517    BNP    Component Value Date/Time   BNP 14.0 11/02/2020 0750     Imaging:  No results found.       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  She is compensated on current regimen.  Doing well on Breztri.  She is undergoing pulmonary rehab.  She feels like she has received benefit from this.  She will graduate in 4 weeks.  Encouraged her to remain active and continue working on exercises after she completes the program.  Action plan in place.  Counseled to remain smoke-free.  Patient Instructions  Continue Albuterol inhaler 2 puffs every 6 hours as needed for shortness of breath or wheezing. Notify if symptoms persist despite rescue inhaler/neb use. Continue Breztri 2 puffs Twice daily. Brush tongue and rinse mouth afterwards   Complete pulmonary rehab. Continue working on your exercises after [employer] graduated.    Follow up in 4 months with Dr. [name]. If symptoms worsen, please contact office for sooner follow up or seek emergency care.   Tobacco use disorder Quit smoking 10/2021.  Undergoing annual LDCT chest with lung cancer screening program.  Next scan anticipated 09/2022.   I spent 25 minutes of dedicated to the care of this patient on the date of this encounter to include pre-visit review of records, face-to-face time with the patient discussing conditions above, post visit ordering of testing, clinical documentation with the electronic health record, making appropriate referrals as documented, and communicating necessary findings to members of the patients care team.  Clayton Bibles, NP 02/02/2022  Pt aware and understands NP's role.

## 2022-02-02 NOTE — Patient Instructions (Addendum)
Continue Albuterol inhaler 2 puffs every 6 hours as needed for shortness of breath or wheezing. Notify if symptoms persist despite rescue inhaler/neb use. Continue Breztri 2 puffs Twice daily. Brush tongue and rinse mouth afterwards   Complete pulmonary rehab. Continue working on your exercises after [employer] graduated.    Follow up in 4 months with Dr. [name]. If symptoms worsen, please contact office for sooner follow up or seek emergency care.

## 2022-02-02 NOTE — Assessment & Plan Note (Signed)
Very severe COPD with asthma overlap.  She is compensated on current regimen.  Doing well on Breztri.  She is undergoing pulmonary rehab.  She feels like she has received benefit from this.  She will graduate in 4 weeks.  Encouraged her to remain active and continue working on exercises after she completes the program.  Action plan in place.  Counseled to remain smoke-free.  Patient Instructions  Continue Albuterol inhaler 2 puffs every 6 hours as needed for shortness of breath or wheezing. Notify if symptoms persist despite rescue inhaler/neb use. Continue Breztri 2 puffs Twice daily. Brush tongue and rinse mouth afterwards   Complete pulmonary rehab. Continue working on your exercises after [employer] graduated.    Follow up in 4 months with Dr. [name]. If symptoms worsen, please contact office for sooner follow up or seek emergency care.

## 2022-02-03 ENCOUNTER — Encounter (HOSPITAL_COMMUNITY): Payer: 59

## 2022-02-08 ENCOUNTER — Encounter (HOSPITAL_COMMUNITY)
Admission: RE | Admit: 2022-02-08 | Discharge: 2022-02-08 | Disposition: A | Discharge status: Home / Self Care | Payer: 59 | Source: Ambulatory Visit | Attending: Internal Medicine | Admitting: Internal Medicine

## 2022-02-08 DIAGNOSIS — J449 Chronic obstructive pulmonary disease, unspecified: Secondary | ICD-10-CM | POA: Diagnosis present

## 2022-02-08 NOTE — Progress Notes (Signed)
Daily Session Note  Patient Details  Name: Jill Glenn MRN: [id number] Date of Birth: 16-Sep-1965 Referring Provider:   [name] Pulmonary Rehab Walk Test from 11/22/2021 in [facility] for Heart, Vascular, & [place]  Referring Provider [name]       Encounter Date: 02/08/2022  Check In:  Session Check In - 02/08/22 1428       Check-In   Supervising physician immediately available to respond to emergencies [facility] MD immediately available    Physician(s) Clung    Location [facility]    Staff Present Janine Ores, RN, Quentin Ore, MS, ACSM-CEP, Exercise Physiologist;Randi Olen Cordial BS, ACSM-CEP, Exercise Physiologist;Other;Samantha [place], RD, LDN    Virtual Visit No    Medication changes reported     No    Fall or balance concerns reported    No    Tobacco Cessation No Change    Warm-up and Cool-down Performed as group-led instruction    Resistance Training Performed Yes    VAD Patient? No    PAD/SET Patient? No      Pain Assessment   Currently in Pain? No/denies    Pain Score 0-No pain    Multiple Pain Sites No             Capillary Blood Glucose: No results found for this or any previous visit (from the past 24 hour(s)).    Social History   Tobacco Use  Smoking Status Former   Packs/day: 1.00   Years: 40.00   Total pack years: 40.00   Types: Cigarettes   Start date: 03/05/1981   Quit date: 10/14/2021   Years since quitting: 0.3  Smokeless Tobacco Never    Goals Met:  Proper associated with RPD/PD & O2 Sat Independence with exercise equipment Exercise tolerated well No report of concerns or symptoms today Strength training completed today  Goals Unmet:  Not Applicable  Comments: Service time is from 1319 to 1440.    Dr. Rodman Pickle is Medical Director for Pulmonary Rehab at [facility].

## 2022-02-10 ENCOUNTER — Encounter (HOSPITAL_COMMUNITY)
Admission: RE | Admit: 2022-02-10 | Discharge: 2022-02-10 | Disposition: A | Discharge status: Home / Self Care | Payer: 59 | Source: Ambulatory Visit | Attending: Internal Medicine | Admitting: Internal Medicine

## 2022-02-10 DIAGNOSIS — J449 Chronic obstructive pulmonary disease, unspecified: Secondary | ICD-10-CM | POA: Diagnosis not present

## 2022-02-10 NOTE — Progress Notes (Signed)
Daily Session Note  Patient Details  Name: Jill Glenn MRN: [id number] Date of Birth: 10/09/1965 Referring Provider:   [name] Pulmonary Rehab Walk Test from 11/22/2021 in [facility]  Referring Provider [name]       Encounter Date: 02/10/2022  Check In:  Session Check In - 02/10/22 1448       Check-In   Supervising physician immediately available to respond to emergencies [facility] MD immediately available    Physician(s) Dr Vaughan Browner    Location [facility]    Staff Present Janine Ores, RN, Quentin Ore, MS, ACSM-CEP, Exercise Physiologist;Samantha [place], RD, LDN;Jetta Walker BS, ACSM-CEP, Exercise Physiologist;Other    Virtual Visit No    Medication changes reported     No    Fall or balance concerns reported    No    Tobacco Cessation No Change    Warm-up and Cool-down Performed as group-led instruction    Resistance Training Performed Yes    VAD Patient? No    PAD/SET Patient? No      Pain Assessment   Currently in Pain? No/denies    Multiple Pain Sites No             Capillary Blood Glucose: No results found for this or any previous visit (from the past 24 hour(s)).    Social History   Tobacco Use  Smoking Status Former   Packs/day: 1.00   Years: 40.00   Total pack years: 40.00   Types: Cigarettes   Start date: 03/05/1981   Quit date: 10/14/2021   Years since quitting: 0.3  Smokeless Tobacco Never    Goals Met:  Independence with exercise equipment Exercise tolerated well No report of concerns or symptoms today Strength training completed today  Goals Unmet:  Not Applicable  Comments: Service time is from 1324 to [place]    Dr. Rodman Pickle is Medical Director for Pulmonary Rehab at [facility].

## 2022-02-15 ENCOUNTER — Encounter (HOSPITAL_COMMUNITY)
Admission: RE | Admit: 2022-02-15 | Discharge: 2022-02-15 | Disposition: A | Discharge status: Home / Self Care | Payer: 59 | Source: Ambulatory Visit | Attending: Internal Medicine | Admitting: Internal Medicine

## 2022-02-15 VITALS — Wt 163.8 lb

## 2022-02-15 DIAGNOSIS — J449 Chronic obstructive pulmonary disease, unspecified: Secondary | ICD-10-CM | POA: Diagnosis not present

## 2022-02-15 NOTE — Progress Notes (Signed)
Daily Session Note  Patient Details  Name: Jill Glenn MRN: [id number] Date of Birth: 1965-11-24 Referring Provider:   [name] Pulmonary Rehab Walk Test from 11/22/2021 in [facility] [place]  Referring Provider [name]       Encounter Date: 02/15/2022  Check In:  Session Check In - 02/15/22 1534       Check-In   Supervising physician immediately available to respond to emergencies [facility] MD immediately available    Physician(s) Sharolyn Douglas    Location [facility]    Staff Present Janine Ores, RN, Quentin Ore, MS, ACSM-CEP, Exercise Physiologist;Carlette Wilber Oliphant, RN, BSN    Virtual Visit No    Medication changes reported     No    Fall or balance concerns reported    No    Tobacco Cessation No Change    Warm-up and Cool-down Performed as group-led instruction    Resistance Training Performed Yes    VAD Patient? No    PAD/SET Patient? No      Pain Assessment   Currently in Pain? No/denies    Pain Score 0-No pain    Multiple Pain Sites No             Capillary Blood Glucose: No results found for this or any previous visit (from the past 24 hour(s)).   Exercise Prescription Changes - 02/15/22 1500       Response to Exercise   Blood Pressure (Admit) 120/75    Blood Pressure (Exercise) 106/71    Blood Pressure (Exit) 126/70    Heart Rate (Admit) 66 bpm    Heart Rate (Exercise) 77 bpm    Heart Rate (Exit) 90 bpm    Oxygen Saturation (Admit) 97 %    Oxygen Saturation (Exercise) 95 %    Oxygen Saturation (Exit) 96 %    Rating of Perceived Exertion (Exercise) 9    Perceived Dyspnea (Exercise) 2    Duration Continue with 30 min of aerobic exercise without signs/symptoms of physical distress.    Intensity THRR unchanged      Progression   Progression Continue to progress workloads to maintain intensity without signs/symptoms of physical distress.      Resistance Training   Training  Prescription Yes    Weight blue bands    Reps 10-15    Time 10 Minutes      Bike   Level 6    Minutes 15    METs 2.8      Recumbant Elliptical   Level 6    Minutes 15    METs 1.1             Social History   Tobacco Use  Smoking Status Former   Packs/day: 1.00   Years: 40.00   Total pack years: 40.00   Types: Cigarettes   Start date: 03/05/1981   Quit date: 10/14/2021   Years since quitting: 0.3  Smokeless Tobacco Never    Goals Met:  Independence with exercise equipment Exercise tolerated well Personal goals reviewed Strength training completed today  Goals Unmet:  Not Applicable  Comments: Service time is from 1327 to 1452    Dr. Rodman Pickle is Medical Director for Pulmonary Rehab at [facility].

## 2022-02-16 NOTE — Progress Notes (Signed)
Pulmonary Individual Treatment Plan  Patient Details  Name: Jill Glenn MRN: [id number] Date of Birth: Sep 09, 1965 Referring Provider:   [name] Pulmonary Rehab Walk Test from 11/22/2021 in [facility] for Heart, Vascular, & [place]  Referring Provider [name]       Initial Encounter Date:  Flowsheet Row Pulmonary Rehab Walk Test from 11/22/2021 in [facility] for Heart, Vascular, & [place]  Date 11/22/21       Visit Diagnosis: Stage 3 severe COPD by GOLD classification ([place])  Patient's Home Medications on Admission:   Current Outpatient Medications:    acetaminophen (TYLENOL) 325 MG tablet, Take 2 tablets (650 mg total) by mouth every 6 (six) hours as needed for moderate pain, mild pain or headache., Disp: , Rfl:    albuterol (VENTOLIN HFA) 108 (90 Base) MCG/ACT inhaler, Inhale 2 puffs into the lungs every 6 (six) hours as needed for wheezing or shortness of breath., Disp: 18 g, Rfl: 3   Budeson-Glycopyrrol-Formoterol (BREZTRI AEROSPHERE) 160-9-4.8 MCG/ACT AERO, Inhale 2 puffs into the lungs in the morning and at bedtime., Disp: 1 each, Rfl: 5   Cranberry 500 MG TABS, Take 1 tablet by mouth daily as needed (uti prevention)., Disp: , Rfl:    Multiple Vitamin (MULTIVITAMIN ADULT) TABS, Take 1 tablet by mouth daily., Disp: , Rfl:    varenicline (CHANTIX CONTINUING MONTH PAK) 1 MG tablet, Take 1 tablet (1 mg total) by mouth 2 (two) times daily., Disp: 60 tablet, Rfl: 5   vitamin C (ASCORBIC ACID) 500 MG tablet, Take 500 mg by mouth daily., Disp: , Rfl:   Past Medical History: Past Medical History:  Diagnosis Date   Asthma     Tobacco Use: Social History   Tobacco Use  Smoking Status Former   Packs/day: 1.00   Years: 40.00   Total pack years: 40.00   Types: Cigarettes   Start date: 03/05/1981   Quit date: 10/14/2021   Years since quitting: 0.3  Smokeless Tobacco Never    Labs: Review Flowsheet        Latest Ref Rng & Units 11/02/2020 12/21/2020  Labs for ITP Cardiac and Pulmonary Rehab  Cholestrol 0 - 200 mg/dL - 177   LDL (calc) 0 - 99 mg/dL - 91   HDL-C >39.00 mg/dL - 74.10   Trlycerides 0.0 - 149.0 mg/dL - 59.0   Bicarbonate 20.0 - 28.0 mmol/L 22.2  -  TCO2 22 - 32 mmol/L 23  -  Acid-base deficit 0.0 - 2.0 mmol/L 3.0  -  O2 Saturation % 90.0  -    Capillary Blood Glucose: No results found for: "GLUCAP"   Pulmonary Assessment Scores:  Pulmonary Assessment Scores     Row Name 11/22/21 1426         ADL UCSD   ADL Phase Entry     SOB Score total 45       CAT Score   CAT Score 24       mMRC Score   mMRC Score 2             UCSD: Self-administered rating of dyspnea associated with activities of daily living (ADLs) 6-point scale (0 = "not at all" to 5 = "maximal or unable to do because of breathlessness")  Scoring Scores range from 0 to 120.  Minimally important difference is 5 units  CAT: CAT can identify the health impairment of COPD patients and is better correlated with disease progression.  CAT has  a scoring range of zero to 40. The CAT score is classified into four groups of low (less than 10), medium (10 - 20), high (21-30) and very high (31-40) based on the impact level of disease on health status. A CAT score over 10 suggests significant symptoms.  A worsening CAT score could be explained by an exacerbation, poor medication adherence, poor inhaler technique, or progression of COPD or comorbid conditions.  CAT MCID is 2 points  mMRC: mMRC (Modified Medical Research Council) Dyspnea Scale is used to assess the degree of baseline functional disability in patients of respiratory disease due to dyspnea. No minimal important difference is established. A decrease in score of 1 point or greater is considered a positive change.   Pulmonary Function Assessment:  Pulmonary Function Assessment - 11/22/21 1429       Breath   Bilateral Breath Sounds Decreased    diminished throughout   Shortness of Breath Yes;Limiting activity             Exercise Target Goals: Exercise Program Goal: Individual exercise prescription set using results from initial 6 min walk test and THRR while considering  patient's activity barriers and safety.   Exercise Prescription Goal: Initial exercise prescription builds to 30-45 minutes a day of aerobic activity, 2-3 days per week.  Home exercise guidelines will be given to patient during program as part of exercise prescription that the participant will acknowledge.  Activity Barriers & Risk Stratification:  Activity Barriers & Cardiac Risk Stratification - 11/22/21 1423       Activity Barriers & Cardiac Risk Stratification   Activity Barriers Deconditioning;Muscular Weakness;Shortness of Breath    Cardiac Risk Stratification Moderate             6 Minute Walk:  6 Minute Walk     Row Name 11/22/21 1526         6 Minute Walk   Phase Initial     Distance 1170 feet     Walk Time 6 minutes     # of Rest Breaks 0     MPH 2.22     METS 3.17     RPE 13     Perceived Dyspnea  2     VO2 Peak 11.09     Symptoms Yes (comment)     Comments SOB, practiced pursed lip breathing     Resting HR 70 bpm     Resting BP 104/70     Resting Oxygen Saturation  98 %     Exercise Oxygen Saturation  during 6 min walk 91 %     Max Ex. HR 80 bpm     Max Ex. BP 120/60     2 Minute Post BP 112/70       Interval HR   1 Minute HR 70     2 Minute HR 80     3 Minute HR 76     4 Minute HR 76     5 Minute HR 76     6 Minute HR 76     2 Minute Post HR 71     Interval Heart Rate? Yes       Interval Oxygen   Interval Oxygen? Yes     Baseline Oxygen Saturation % 98 %     1 Minute Oxygen Saturation % 95 %     1 Minute Liters of Oxygen 0 L     2 Minute Oxygen Saturation % 95 %     2  Minute Liters of Oxygen 0 L     3 Minute Oxygen Saturation % 94 %     3 Minute Liters of Oxygen 0 L     4 Minute Oxygen Saturation % 91  %     4 Minute Liters of Oxygen 0 L     5 Minute Oxygen Saturation % 93 %     5 Minute Liters of Oxygen 0 L     6 Minute Oxygen Saturation % 93 %     6 Minute Liters of Oxygen 0 L     2 Minute Post Oxygen Saturation % 98 %     2 Minute Post Liters of Oxygen 0 L              Oxygen Initial Assessment:  Oxygen Initial Assessment - 11/24/21 1300       Home Oxygen   Home Oxygen Device None    Sleep Oxygen Prescription None    Home Exercise Oxygen Prescription None    Home Resting Oxygen Prescription None    Compliance with Home Oxygen Use Yes      Initial 6 min Walk   Oxygen Used None      Program Oxygen Prescription   Program Oxygen Prescription None      Intervention   Short Term Goals To learn and exhibit compliance with exercise, home and travel O2 prescription;To learn and understand importance of monitoring SPO2 with pulse oximeter and demonstrate accurate use of the pulse oximeter.;To learn and understand importance of maintaining oxygen saturations>88%;To learn and demonstrate proper pursed lip breathing techniques or other breathing techniques. ;To learn and demonstrate proper use of respiratory medications    Long  Term Goals Exhibits compliance with exercise, home  and travel O2 prescription;Verbalizes importance of monitoring SPO2 with pulse oximeter and return demonstration;Maintenance of O2 saturations>88%;Exhibits proper breathing techniques, such as pursed lip breathing or other method taught during program session;Compliance with respiratory medication;Demonstrates proper use of MDI's             Oxygen Re-Evaluation:  Oxygen Re-Evaluation     Row Name 12/21/21 0755 01/14/22 1516 02/07/22 1107         Program Oxygen Prescription   Program Oxygen Prescription None None None       Home Oxygen   Home Oxygen Device None None None     Sleep Oxygen Prescription None None None     Home Exercise Oxygen Prescription None None None     Home Resting Oxygen  Prescription None None None     Compliance with Home Oxygen Use Yes Yes Yes       Goals/Expected Outcomes   Short Term Goals To learn and exhibit compliance with exercise, home and travel O2 prescription;To learn and understand importance of monitoring SPO2 with pulse oximeter and demonstrate accurate use of the pulse oximeter.;To learn and understand importance of maintaining oxygen saturations>88%;To learn and demonstrate proper pursed lip breathing techniques or other breathing techniques. ;To learn and demonstrate proper use of respiratory medications To learn and exhibit compliance with exercise, home and travel O2 prescription;To learn and understand importance of monitoring SPO2 with pulse oximeter and demonstrate accurate use of the pulse oximeter.;To learn and understand importance of maintaining oxygen saturations>88%;To learn and demonstrate proper pursed lip breathing techniques or other breathing techniques. ;To learn and demonstrate proper use of respiratory medications To learn and exhibit compliance with exercise, home and travel O2 prescription;To learn and understand importance of monitoring SPO2 with pulse oximeter  and demonstrate accurate use of the pulse oximeter.;To learn and understand importance of maintaining oxygen saturations>88%;To learn and demonstrate proper pursed lip breathing techniques or other breathing techniques. ;To learn and demonstrate proper use of respiratory medications     Long  Term Goals Exhibits compliance with exercise, home  and travel O2 prescription;Verbalizes importance of monitoring SPO2 with pulse oximeter and return demonstration;Maintenance of O2 saturations>88%;Exhibits proper breathing techniques, such as pursed lip breathing or other method taught during program session;Compliance with respiratory medication;Demonstrates proper use of MDI's Exhibits compliance with exercise, home  and travel O2 prescription;Verbalizes importance of monitoring SPO2 with  pulse oximeter and return demonstration;Maintenance of O2 saturations>88%;Exhibits proper breathing techniques, such as pursed lip breathing or other method taught during program session;Compliance with respiratory medication;Demonstrates proper use of MDI's Exhibits compliance with exercise, home  and travel O2 prescription;Verbalizes importance of monitoring SPO2 with pulse oximeter and return demonstration;Maintenance of O2 saturations>88%;Exhibits proper breathing techniques, such as pursed lip breathing or other method taught during program session;Compliance with respiratory medication;Demonstrates proper use of MDI's     Goals/Expected Outcomes Compliance and understanding of oxygen saturations monitoring and breathing techniques to decrease shortness of breath. Compliance and understanding of oxygen saturations monitoring and breathing techniques to decrease shortness of breath. Compliance and understanding of oxygen saturations monitoring and breathing techniques to decrease shortness of breath.              Oxygen Discharge (Final Oxygen Re-Evaluation):  Oxygen Re-Evaluation - 02/07/22 1107       Program Oxygen Prescription   Program Oxygen Prescription None      Home Oxygen   Home Oxygen Device None    Sleep Oxygen Prescription None    Home Exercise Oxygen Prescription None    Home Resting Oxygen Prescription None    Compliance with Home Oxygen Use Yes      Goals/Expected Outcomes   Short Term Goals To learn and exhibit compliance with exercise, home and travel O2 prescription;To learn and understand importance of monitoring SPO2 with pulse oximeter and demonstrate accurate use of the pulse oximeter.;To learn and understand importance of maintaining oxygen saturations>88%;To learn and demonstrate proper pursed lip breathing techniques or other breathing techniques. ;To learn and demonstrate proper use of respiratory medications    Long  Term Goals Exhibits compliance with  exercise, home  and travel O2 prescription;Verbalizes importance of monitoring SPO2 with pulse oximeter and return demonstration;Maintenance of O2 saturations>88%;Exhibits proper breathing techniques, such as pursed lip breathing or other method taught during program session;Compliance with respiratory medication;Demonstrates proper use of MDI's    Goals/Expected Outcomes Compliance and understanding of oxygen saturations monitoring and breathing techniques to decrease shortness of breath.             Initial Exercise Prescription:  Initial Exercise Prescription - 11/22/21 1500       Date of Initial Exercise RX and Referring Provider   Date 11/22/21    Referring Provider [name]    Expected Discharge Date 01/27/22      Recumbant Bike   Level 1    RPM 70    Minutes 15      Recumbant Elliptical   Level 1    RPM 70    Minutes 15      Prescription Details   Frequency (times per week) 2    Duration Progress to 30 minutes of continuous aerobic without signs/symptoms of physical distress      Intensity   THRR 40-80% of Max Heartrate 66-132  Ratings of Perceived Exertion 11-13    Perceived Dyspnea 0-4      Progression   Progression Continue progressive overload as per policy without signs/symptoms or physical distress.      Resistance Training   Training Prescription Yes    Weight blue bands    Reps 10-15             Perform Capillary Blood Glucose checks as needed.  Exercise Prescription Changes:   Exercise Prescription Changes     Row Name 12/16/21 1600 12/21/21 1352 12/22/21 1300 01/04/22 1500 02/01/22 1500     Response to Exercise   Blood Pressure (Admit) -- 110/72 -- 125/84 110/70   Blood Pressure (Exercise) -- 119/86 -- 134/86 120/80   Blood Pressure (Exit) -- 112/82 -- 106/78 131/80   Heart Rate (Admit) -- 76 bpm -- 72 bpm 86 bpm   Heart Rate (Exercise) -- 117 bpm -- 94 bpm 101 bpm   Heart Rate (Exit) -- 82 bpm -- 82 bpm 85 bpm   Oxygen Saturation  (Admit) -- 98 % -- 100 % 98 %   Oxygen Saturation (Exercise) -- 95 % -- 94 % 95 %   Oxygen Saturation (Exit) -- 94 % -- 96 % 98 %   Rating of Perceived Exertion (Exercise) -- 13 -- 13 13   Perceived Dyspnea (Exercise) -- 2 -- 3 2   Duration -- Continue with 30 min of aerobic exercise without signs/symptoms of physical distress. -- Continue with 30 min of aerobic exercise without signs/symptoms of physical distress. Continue with 30 min of aerobic exercise without signs/symptoms of physical distress.   Intensity -- THRR unchanged -- THRR unchanged THRR unchanged     Progression   Progression -- Continue to progress workloads to maintain intensity without signs/symptoms of physical distress. -- Continue to progress workloads to maintain intensity without signs/symptoms of physical distress. Continue to progress workloads to maintain intensity without signs/symptoms of physical distress.     Resistance Training   Training Prescription -- Yes -- -- Yes   Weight -- blue bands -- -- blue bands   Reps -- 10-15 -- 10-15 10-15   Time -- 10 Minutes -- 10 Minutes 10 Minutes     Bike   Level -- -- -- -- 5   Minutes -- -- -- -- 15   METs -- -- -- -- 3.7     Recumbant Bike   Level -- 3 -- 3 --   RPM -- 70 -- -- --   Minutes -- 3.7 -- 15 --   METs -- -- -- 3.5 --     Recumbant Elliptical   Level -- 3 -- 3 5   RPM -- -- -- -- 70   Minutes -- 4 -- 15 15   METs -- -- -- 3.1 2.7     Home Exercise Plan   Plans to continue exercise at Home (comment)  walking -- -- -- --   Frequency --  n/a, pt is walking most days -- -- -- --   Initial Home Exercises Provided 12/16/21 -- -- -- --    [place] Name 02/15/22 1500             Response to Exercise   Blood Pressure (Admit) 120/75       Blood Pressure (Exercise) 106/71       Blood Pressure (Exit) 126/70       Heart Rate (Admit) 66 bpm       Heart Rate (Exercise) 77  bpm       Heart Rate (Exit) 90 bpm       Oxygen Saturation (Admit) 97 %        Oxygen Saturation (Exercise) 95 %       Oxygen Saturation (Exit) 96 %       Rating of Perceived Exertion (Exercise) 9       Perceived Dyspnea (Exercise) 2       Duration Continue with 30 min of aerobic exercise without signs/symptoms of physical distress.       Intensity THRR unchanged         Progression   Progression Continue to progress workloads to maintain intensity without signs/symptoms of physical distress.         Resistance Training   Training Prescription Yes       Weight blue bands       Reps 10-15       Time 10 Minutes         Bike   Level 6       Minutes 15       METs 2.8         Recumbant Elliptical   Level 6       Minutes 15       METs 1.1                Exercise Comments:   Exercise Comments     Row Name 12/16/21 1619           Exercise Comments Discussed pts home exercise plan. She is currently walking 30-60 min 3-4 days a week on top of coming to [place]. No change needed, encouraged pt to continue.                Exercise Goals and Review:   Exercise Goals     Row Name 11/22/21 1519 11/24/21 1259 12/21/21 0732 01/14/22 1514 02/07/22 1103     Exercise Goals   Increase Physical Activity Yes Yes Yes Yes Yes   Intervention Provide advice, education, support and counseling about physical activity/exercise needs.;Develop an individualized exercise prescription for aerobic and resistive training based on initial evaluation findings, risk stratification, comorbidities and participant's personal goals. Provide advice, education, support and counseling about physical activity/exercise needs.;Develop an individualized exercise prescription for aerobic and resistive training based on initial evaluation findings, risk stratification, comorbidities and participant's personal goals. Provide advice, education, support and counseling about physical activity/exercise needs.;Develop an individualized exercise prescription for aerobic and resistive training based  on initial evaluation findings, risk stratification, comorbidities and participant's personal goals. Provide advice, education, support and counseling about physical activity/exercise needs.;Develop an individualized exercise prescription for aerobic and resistive training based on initial evaluation findings, risk stratification, comorbidities and participant's personal goals. Provide advice, education, support and counseling about physical activity/exercise needs.;Develop an individualized exercise prescription for aerobic and resistive training based on initial evaluation findings, risk stratification, comorbidities and participant's personal goals.   Expected Outcomes Short Term: Attend rehab on a regular basis to increase amount of physical activity.;Long Term: Exercising regularly at least 3-5 days a week.;Long Term: Add in home exercise to make exercise part of routine and to increase amount of physical activity. Short Term: Attend rehab on a regular basis to increase amount of physical activity.;Long Term: Exercising regularly at least 3-5 days a week.;Long Term: Add in home exercise to make exercise part of routine and to increase amount of physical activity. Short Term: Attend rehab on a regular basis to increase amount  of physical activity.;Long Term: Exercising regularly at least 3-5 days a week.;Long Term: Add in home exercise to make exercise part of routine and to increase amount of physical activity. Short Term: Attend rehab on a regular basis to increase amount of physical activity.;Long Term: Exercising regularly at least 3-5 days a week.;Long Term: Add in home exercise to make exercise part of routine and to increase amount of physical activity. Short Term: Attend rehab on a regular basis to increase amount of physical activity.;Long Term: Exercising regularly at least 3-5 days a week.;Long Term: Add in home exercise to make exercise part of routine and to increase amount of physical activity.    Increase Strength and Stamina Yes Yes Yes Yes Yes   Intervention Provide advice, education, support and counseling about physical activity/exercise needs.;Develop an individualized exercise prescription for aerobic and resistive training based on initial evaluation findings, risk stratification, comorbidities and participant's personal goals. Provide advice, education, support and counseling about physical activity/exercise needs.;Develop an individualized exercise prescription for aerobic and resistive training based on initial evaluation findings, risk stratification, comorbidities and participant's personal goals. Provide advice, education, support and counseling about physical activity/exercise needs.;Develop an individualized exercise prescription for aerobic and resistive training based on initial evaluation findings, risk stratification, comorbidities and participant's personal goals. Provide advice, education, support and counseling about physical activity/exercise needs.;Develop an individualized exercise prescription for aerobic and resistive training based on initial evaluation findings, risk stratification, comorbidities and participant's personal goals. Provide advice, education, support and counseling about physical activity/exercise needs.;Develop an individualized exercise prescription for aerobic and resistive training based on initial evaluation findings, risk stratification, comorbidities and participant's personal goals.   Expected Outcomes Short Term: Increase workloads from initial exercise prescription for resistance, speed, and METs.;Short Term: Perform resistance training exercises routinely during rehab and add in resistance training at home;Long Term: Improve cardiorespiratory fitness, muscular endurance and strength as measured by increased METs and functional capacity (6MWT) Short Term: Increase workloads from initial exercise prescription for resistance, speed, and METs.;Short Term:  Perform resistance training exercises routinely during rehab and add in resistance training at home;Long Term: Improve cardiorespiratory fitness, muscular endurance and strength as measured by increased METs and functional capacity (6MWT) Short Term: Increase workloads from initial exercise prescription for resistance, speed, and METs.;Short Term: Perform resistance training exercises routinely during rehab and add in resistance training at home;Long Term: Improve cardiorespiratory fitness, muscular endurance and strength as measured by increased METs and functional capacity (6MWT) Short Term: Increase workloads from initial exercise prescription for resistance, speed, and METs.;Short Term: Perform resistance training exercises routinely during rehab and add in resistance training at home;Long Term: Improve cardiorespiratory fitness, muscular endurance and strength as measured by increased METs and functional capacity (6MWT) Short Term: Increase workloads from initial exercise prescription for resistance, speed, and METs.;Short Term: Perform resistance training exercises routinely during rehab and add in resistance training at home;Long Term: Improve cardiorespiratory fitness, muscular endurance and strength as measured by increased METs and functional capacity (6MWT)   Able to understand and use rate of perceived exertion (RPE) scale Yes Yes Yes Yes Yes   Intervention Provide education and explanation on how to use RPE scale Provide education and explanation on how to use RPE scale Provide education and explanation on how to use RPE scale Provide education and explanation on how to use RPE scale Provide education and explanation on how to use RPE scale   Expected Outcomes Short Term: Able to use RPE daily in rehab to express subjective intensity  level;Long Term:  Able to use RPE to guide intensity level when exercising independently Short Term: Able to use RPE daily in rehab to express subjective intensity  level;Long Term:  Able to use RPE to guide intensity level when exercising independently Short Term: Able to use RPE daily in rehab to express subjective intensity level;Long Term:  Able to use RPE to guide intensity level when exercising independently Short Term: Able to use RPE daily in rehab to express subjective intensity level;Long Term:  Able to use RPE to guide intensity level when exercising independently Short Term: Able to use RPE daily in rehab to express subjective intensity level;Long Term:  Able to use RPE to guide intensity level when exercising independently   Able to understand and use Dyspnea scale Yes Yes Yes Yes Yes   Intervention Provide education and explanation on how to use Dyspnea scale Provide education and explanation on how to use Dyspnea scale Provide education and explanation on how to use Dyspnea scale Provide education and explanation on how to use Dyspnea scale Provide education and explanation on how to use Dyspnea scale   Expected Outcomes Short Term: Able to use Dyspnea scale daily in rehab to express subjective sense of shortness of breath during exertion;Long Term: Able to use Dyspnea scale to guide intensity level when exercising independently Short Term: Able to use Dyspnea scale daily in rehab to express subjective sense of shortness of breath during exertion;Long Term: Able to use Dyspnea scale to guide intensity level when exercising independently Short Term: Able to use Dyspnea scale daily in rehab to express subjective sense of shortness of breath during exertion;Long Term: Able to use Dyspnea scale to guide intensity level when exercising independently Short Term: Able to use Dyspnea scale daily in rehab to express subjective sense of shortness of breath during exertion;Long Term: Able to use Dyspnea scale to guide intensity level when exercising independently Short Term: Able to use Dyspnea scale daily in rehab to express subjective sense of shortness of breath  during exertion;Long Term: Able to use Dyspnea scale to guide intensity level when exercising independently   Knowledge and understanding of Target Heart Rate Range (THRR) Yes Yes Yes Yes Yes   Intervention Provide education and explanation of THRR including how the numbers were predicted and where they are located for reference Provide education and explanation of THRR including how the numbers were predicted and where they are located for reference Provide education and explanation of THRR including how the numbers were predicted and where they are located for reference Provide education and explanation of THRR including how the numbers were predicted and where they are located for reference Provide education and explanation of THRR including how the numbers were predicted and where they are located for reference   Expected Outcomes Short Term: Able to state/look up THRR;Long Term: Able to use THRR to govern intensity when exercising independently;Short Term: Able to use daily as guideline for intensity in rehab Short Term: Able to state/look up THRR;Long Term: Able to use THRR to govern intensity when exercising independently;Short Term: Able to use daily as guideline for intensity in rehab Short Term: Able to state/look up THRR;Long Term: Able to use THRR to govern intensity when exercising independently;Short Term: Able to use daily as guideline for intensity in rehab Short Term: Able to state/look up THRR;Long Term: Able to use THRR to govern intensity when exercising independently;Short Term: Able to use daily as guideline for intensity in rehab Short Term: Able to state/look up  THRR;Long Term: Able to use THRR to govern intensity when exercising independently;Short Term: Able to use daily as guideline for intensity in rehab   Understanding of Exercise Prescription Yes Yes Yes Yes Yes   Intervention Provide education, explanation, and written materials on patient's individual exercise prescription  Provide education, explanation, and written materials on patient's individual exercise prescription Provide education, explanation, and written materials on patient's individual exercise prescription Provide education, explanation, and written materials on patient's individual exercise prescription Provide education, explanation, and written materials on patient's individual exercise prescription   Expected Outcomes Short Term: Able to explain program exercise prescription;Long Term: Able to explain home exercise prescription to exercise independently Short Term: Able to explain program exercise prescription;Long Term: Able to explain home exercise prescription to exercise independently Short Term: Able to explain program exercise prescription;Long Term: Able to explain home exercise prescription to exercise independently Short Term: Able to explain program exercise prescription;Long Term: Able to explain home exercise prescription to exercise independently Short Term: Able to explain program exercise prescription;Long Term: Able to explain home exercise prescription to exercise independently            Exercise Goals Re-Evaluation :  Exercise Goals Re-Evaluation     Row Name 11/24/21 1259 12/21/21 0732 01/14/22 1514 02/07/22 1103       Exercise Goal Re-Evaluation   Exercise Goals Review Increase Physical Activity;Increase Strength and Stamina;Able to understand and use rate of perceived exertion (RPE) scale;Able to understand and use Dyspnea scale;Knowledge and understanding of Target Heart Rate Range (THRR);Understanding of Exercise Prescription Increase Physical Activity;Increase Strength and Stamina;Able to understand and use rate of perceived exertion (RPE) scale;Able to understand and use Dyspnea scale;Knowledge and understanding of Target Heart Rate Range (THRR);Understanding of Exercise Prescription Increase Physical Activity;Increase Strength and Stamina;Able to understand and use rate of  perceived exertion (RPE) scale;Able to understand and use Dyspnea scale;Knowledge and understanding of Target Heart Rate Range (THRR);Understanding of Exercise Prescription Increase Physical Activity;Increase Strength and Stamina;Able to understand and use rate of perceived exertion (RPE) scale;Able to understand and use Dyspnea scale;Knowledge and understanding of Target Heart Rate Range (THRR);Understanding of Exercise Prescription    Comments Pt will begin exercise next week. Will monitor for progression. Pt has completed 6 exercise sessions. She is motivated and has not missed sessions. She is exercising on the recumbent elliptical for 15 min at level 3, METs 2.9 (however was talking this day and has had 3.4 METs). Then she exercises on the recumbent bike for 15 min at level 3, METs 3.9. She performs warm up and cool down independently. She is exercising at home.  Will monitor for continued progression. Pt has completed 13 exercise sessions. She is motivated and has not missed sessions. She is exercising on the recumbent elliptical for 15 min at level 4, METs 4.1. Then she exercises on the recumbent bike for 15 min at level 4, METs 5.0. She performs warm up and cool down independently. She is exercising at home.  We just added exercise sessions at pts request. Will discuss trying Scibike next session. Will monitor for continued progression. Pt has completed 18 exercise sessions. She is motivated and infrequently misses sessions.  She is exercising on the recumbent elliptical for 15 min at level 5, with max MET 5.4. She had a difficult breathing day last session with only 2.7 METs Then she exercises on the scifit bike (upright) for 15 min at level 5, METs 3.7. She performs warm up and cool down independently. She is  exercising at home.  We just added exercise sessions at pts request. Transitioned to Scifit bike easily. Will monitor for continued progression.    Expected Outcomes Through exercise at rehab and  home, the patient will decrease shortness of breath with daily activities and feel confident in carrying out an exercise regimen at home. Through exercise at rehab and home, the patient will decrease shortness of breath with daily activities and feel confident in carrying out an exercise regimen at home. Through exercise at rehab and home, the patient will decrease shortness of breath with daily activities and feel confident in carrying out an exercise regimen at home. Through exercise at rehab and home, the patient will decrease shortness of breath with daily activities and feel confident in carrying out an exercise regimen at home.             Discharge Exercise Prescription (Final Exercise Prescription Changes):  Exercise Prescription Changes - 02/15/22 1500       Response to Exercise   Blood Pressure (Admit) 120/75    Blood Pressure (Exercise) 106/71    Blood Pressure (Exit) 126/70    Heart Rate (Admit) 66 bpm    Heart Rate (Exercise) 77 bpm    Heart Rate (Exit) 90 bpm    Oxygen Saturation (Admit) 97 %    Oxygen Saturation (Exercise) 95 %    Oxygen Saturation (Exit) 96 %    Rating of Perceived Exertion (Exercise) 9    Perceived Dyspnea (Exercise) 2    Duration Continue with 30 min of aerobic exercise without signs/symptoms of physical distress.    Intensity THRR unchanged      Progression   Progression Continue to progress workloads to maintain intensity without signs/symptoms of physical distress.      Resistance Training   Training Prescription Yes    Weight blue bands    Reps 10-15    Time 10 Minutes      Bike   Level 6    Minutes 15    METs 2.8      Recumbant Elliptical   Level 6    Minutes 15    METs 1.1             Nutrition:  Target Goals: Understanding of nutrition guidelines, daily intake of sodium <1558m, cholesterol <2057m calories 30% from fat and 7% or less from saturated fats, daily to have 5 or more servings of fruits and  vegetables.  Biometrics:  Pre Biometrics - 11/22/21 1529       Pre Biometrics   Grip Strength 26 kg              Nutrition Therapy Plan and Nutrition Goals:  Nutrition Therapy & Goals - 02/10/22 1613       Nutrition Therapy   Diet General Healthy Diet      Personal Nutrition Goals   Nutrition Goal Patient to improve dietary quality by using the plate method as a daily guide for meal planning to include lean protein/plant protein, fruits, vegetables, whole grains, and nonfat/low fat dairy as part of well balanced diet    Personal Goal #2 Patient to identify strategies for weight loss of 0.5-2.0# per week of weight loss.    Comments AnJazzellequested information toward establishing new PCP. She does report snacking on refined carbohydrates and eating in the middle of the night; she was receptive to information regarding behavioral health resources. She is up 6.8# since starting with our program and up 34# since March 2023. AnLevada Dy  would continue to benefit from weight loss and decrease intake of refined carbohydrates to support pulmonary disease.      Intervention Plan   Intervention Prescribe, educate and counsel regarding individualized specific dietary modifications aiming towards targeted core components such as weight, hypertension, lipid management, diabetes, heart failure and other comorbidities.;Nutrition handout(s) given to patient.    Expected Outcomes Short Term Goal: Understand basic principles of dietary content, such as calories, fat, sodium, cholesterol and nutrients.;Long Term Goal: Adherence to prescribed nutrition plan.             Nutrition Assessments:  MEDIFICTS Score Key: ?70 Need to make dietary changes  40-70 Heart Healthy Diet ? 40 Therapeutic Level Cholesterol Diet   Picture Your Plate Scores: [id number] Unhealthy dietary pattern with much room for improvement. 41-50 Dietary pattern unlikely to meet recommendations for good health and room for  improvement. 51-60 More healthful dietary pattern, with some room for improvement.  >60 Healthy dietary pattern, although there may be some specific behaviors that could be improved.    Nutrition Goals Re-Evaluation:  Nutrition Goals Re-Evaluation     [place] Name 11/30/21 1436 12/21/21 1533 01/18/22 1518 02/10/22 1613       Goals   Current Weight 153 lb 10.6 oz (69.7 kg) 156 lb 8.4 oz (71 kg) 162 lb 0.6 oz (73.5 kg) 160 lb 11.5 oz (72.9 kg)    Comment labs WNL No new labs at this time. No new labs at this time. No new labs at this time.    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 She lives at home with her husband. She does the grocery shopping and cooking. She reports normal appetite and eats three meals per day. She does report eating in the middle of the night and some emotional type eating over the last 6+ months. She was using Chantix to quit smoking. Her usual body weight is 128-130#; she is up ~25# over the last 11 months. 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 She lives at home with her husband. She does the grocery shopping and cooking. She reports normal appetite and eats three meals per day. She does report eating in the middle of the night and some emotional type eating over the last 6+ months. She was using Chantix to quit smoking. Her typical weight is 128-130#; she is up ~25# over the last 11 months.  Gave resources on COPD nutrition, strategies for weight loss, and mindful eating resources. Patient remains precontemplative toward nutrition changes. She reports over snacking and snacking in the middle of the night. She has been working on smoking cessation during this time. Gitanjali declines referral to behavioral health/counseling to aid with emotional eating, reports of depression, etc. She is up 2.6# since starting with our program and up 29.3# since March 2023. Adelaine would continue to benefit from weight loss and decreased intake of refined carbohydrates to support  pulmonary disease. She does report good support from her husband and father. Patient remains precontemplative toward nutrition changes or referral to counseling for stress eating support. She reports over snacking and snacking in the middle of the night.She is up 8# since starting with our program and up 35# since March 2023. Devoiry would continue to benefit from weight loss and decrease intake of refined carbohydrates to support pulmonary disease. Levada Dy requested information toward establishing new PCP. She does report snacking on refined carbohydrates and eating in the middle of the night; she was receptive today to information regarding  behavioral health resources and making dietary changes. She is up 6.8# since starting with our program and up 34# since March 2023. Tary would continue to benefit from weight loss and decrease intake of refined carbohydrates to support pulmonary disease.             Nutrition Goals Discharge (Final Nutrition Goals Re-Evaluation):  Nutrition Goals Re-Evaluation - 02/10/22 1613       Goals   Current Weight 160 lb 11.5 oz (72.9 kg)    Comment No new labs at this time.    Expected Outcome Nimisha requested information toward establishing new PCP. She does report snacking on refined carbohydrates and eating in the middle of the night; she was receptive today to information regarding behavioral health resources and making dietary changes. She is up 6.8# since starting with our program and up 34# since March 2023. Janeth would continue to benefit from weight loss and decrease intake of refined carbohydrates to support pulmonary disease.             Psychosocial: Target Goals: Acknowledge presence or absence of significant depression and/or stress, maximize coping skills, provide positive support system. Participant is able to verbalize types and ability to use techniques and skills needed for reducing stress and depression.  Initial Review & Psychosocial  Screening:  Initial Psych Review & Screening - 11/22/21 1405       Initial Review   Current issues with Current Depression      Family Dynamics   Good Support System? Yes    Comments Husband, Brigid Re, 2 daughters      Barriers   Psychosocial barriers to participate in program The patient should benefit from training in stress management and relaxation.      Screening Interventions   Interventions Encouraged to exercise    Expected Outcomes Short Term goal: Utilizing psychosocial counselor, staff and physician to assist with identification of specific Stressors or current issues interfering with healing process. Setting desired goal for each stressor or current issue identified.;Long Term Goal: Stressors or current issues are controlled or eliminated.;Short Term goal: Identification and review with participant of any Quality of Life or Depression concerns found by scoring the questionnaire.;Long Term goal: The participant improves quality of Life and PHQ9 Scores as seen by post scores and/or verbalization of changes             Quality of Life Scores:  Scores of 19 and below usually indicate a poorer quality of life in these areas.  A difference of  2-3 points is a clinically meaningful difference.  A difference of 2-3 points in the total score of the Quality of Life Index has been associated with significant improvement in overall quality of life, self-image, physical symptoms, and general health in studies assessing change in quality of life.  PHQ-9: Review Flowsheet       11/22/2021 03/22/2021 12/21/2020  Depression screen PHQ 2/9  Decreased Interest 1 1 0  Down, Depressed, Hopeless 1 1 1  $ PHQ - 2 Score 2 2 1  $ Altered sleeping 2 3 -  Tired, decreased energy 1 3 -  Change in appetite 2 1 -  Feeling bad or failure about yourself  3 0 -  Trouble concentrating 0 0 -  Moving slowly or fidgety/restless 1 0 -  Suicidal thoughts 0 0 -  PHQ-9 Score 11 9 -  Difficult doing  work/chores Somewhat difficult - -   Interpretation of Total Score  Total Score Depression Severity:  1-4 =  Minimal depression, 5-9 = Mild depression, 10-14 = Moderate depression, 15-19 = Moderately severe depression, 20-27 = Severe depression   Psychosocial Evaluation and Intervention:  Psychosocial Evaluation - 11/22/21 1415       Psychosocial Evaluation & Interventions   Interventions Stress management education;Relaxation education;Encouraged to exercise with the program and follow exercise prescription    Comments Victoriaann will benefit from exercise    Expected Outcomes For pt to participate in [place]  Follow up required by staff             Psychosocial Re-Evaluation:  Psychosocial Re-Evaluation     [place] Name 11/23/21 0954 12/22/21 1320 01/19/22 1034 02/09/22 1605       Psychosocial Re-Evaluation   Current issues with Current Depression Current Depression Current Depression Current Depression    Comments Say has completed PR orientation and is starting the program 11/30/21. We will continue to monitor for psychosocail barriers. Cassara admits that she has depression and was advised to follow up with counseling but, she has elected not to do this. This has not stopped her from attending the PR program. She states that she is enjoying the program and interacts with others in her class. We have provided her with a list of resources for seeking out a counselor. She is participating in the relaxation sessions during cool down. Martine is still stable with her depression. She revealed to staff that she was previously hospitalized due to her mental health. Tenleigh feels that she can adequately monitor how she feels and is able to reach out for help if needed. She has good support from her husband and children who check on her as well. We recommend Romina to see a therapist, but she is still currently declini. Her diagnosis has not stopped Autum from coming or  attending pulmonary rehab. Marianny also disclosed to staff that her pulmonologist has spoken to her about the possibility of a lung transplant. She is an optimist outlook on her disease progression and is taking it "one day at a time". She states that she is enjoying the program and socializes with others in her class. We will continue to monitor and assess Lurana for any needs. Kaitlynd is still stable with her depression. She revealed to staff that she was previously hospitalized due to her mental health. Lakshmi feels that she can adequately monitor how she feels and is able to reach out for help if needed. She has good support from her husband and children who check on her as well. We recommend Lateasha to see a therapist, but she is still currently declining. Her diagnosis has not stopped Rabiah from coming or attending pulmonary rehab. Mayreli also disclosed to staff that her pulmonologist has spoken to her about the possibility of a lung transplant. She has an optimistic outlook on her disease progression and is taking it "one day at a time". She states that she is enjoying the program and socializes with others in her class. We will continue to monitor and assess Erleen for any needs.    Expected Outcomes For her to participate in the program without any psychosocial issues or concerns. For her to attend the program without psychosocial issues or concerns. For Irais to attend the program without psychosocial issues or concerns. For Shalyse to attend the program without psychosocial issues or concerns.    Interventions Stress management education;Relaxation education Encouraged to attend Pulmonary Rehabilitation for the exercise;Relaxation education;Stress management education Encouraged to attend Pulmonary Rehabilitation for  the exercise;Relaxation education;Stress management education Encouraged to attend Pulmonary Rehabilitation for the exercise;Relaxation education;Stress management education    Continue  Psychosocial Services  No Follow up required No Follow up required Follow up required by staff Follow up required by staff             Psychosocial Discharge (Final Psychosocial Re-Evaluation):  Psychosocial Re-Evaluation - 02/09/22 1605       Psychosocial Re-Evaluation   Current issues with Current Depression    Comments Kailiyah is still stable with her depression. She revealed to staff that she was previously hospitalized due to her mental health. Shantey feels that she can adequately monitor how she feels and is able to reach out for help if needed. She has good support from her husband and children who check on her as well. We recommend Rania to see a therapist, but she is still currently declining. Her diagnosis has not stopped Findley from coming or attending pulmonary rehab. Cherita also disclosed to staff that her pulmonologist has spoken to her about the possibility of a lung transplant. She has an optimistic outlook on her disease progression and is taking it "one day at a time". She states that she is enjoying the program and socializes with others in her class. We will continue to monitor and assess Melvenia for any needs.    Expected Outcomes For Parrish to attend the program without psychosocial issues or concerns.    Interventions Encouraged to attend Pulmonary Rehabilitation for the exercise;Relaxation education;Stress management education    Continue Psychosocial Services  Follow up required by staff             Education: Education Goals: Education classes will be provided on a weekly basis, covering required topics. Participant will state understanding/return demonstration of topics presented.  Learning Barriers/Preferences:  Learning Barriers/Preferences - 11/22/21 1415       Learning Barriers/Preferences   Learning Barriers Sight    Learning Preferences Audio;Group Instruction;Individual Instruction;Verbal Instruction;Video;Written Material              Education Topics: Introduction to Pulmonary Rehab Group instruction provided by PowerPoint, verbal discussion, and written material to support subject matter. Instructor reviews what Pulmonary Rehab is, the purpose of the program, and how patients are referred.     Know Your Numbers Group instruction that is supported by a PowerPoint presentation. Instructor discusses importance of knowing and understanding resting, exercise, and post-exercise oxygen saturation, heart rate, and blood pressure. Oxygen saturation, heart rate, blood pressure, rating of perceived exertion, and dyspnea are reviewed along with a normal range for these values.    Exercise for the Pulmonary Patient Group instruction that is supported by a PowerPoint presentation. Instructor discusses benefits of exercise, core components of exercise, frequency, duration, and intensity of an exercise routine, importance of utilizing pulse oximetry during exercise, safety while exercising, and options of places to exercise outside of rehab.  Flowsheet Row PULMONARY REHAB CHRONIC OBSTRUCTIVE PULMONARY DISEASE from 12/30/2021 in [facility]  Date 12/30/21  Educator EP  Instruction Review Code 1- Verbalizes Understanding       MET Level  Group instruction provided by PowerPoint, verbal discussion, and written material to support subject matter. Instructor reviews what METs are and how to increase METs.    Pulmonary Medications Verbally interactive group education provided by instructor with focus on inhaled medications and proper administration.   Anatomy and Physiology of the Respiratory System Group instruction provided by PowerPoint, verbal  discussion, and written material to support subject matter. Instructor reviews respiratory cycle and anatomical components of the respiratory system and their functions. Instructor also reviews differences in obstructive and  restrictive respiratory diseases with examples of each.    Oxygen Safety Group instruction provided by PowerPoint, verbal discussion, and written material to support subject matter. There is an overview of "What is Oxygen" and "Why do we need it".  Instructor also reviews how to create a safe environment for oxygen use, the importance of using oxygen as prescribed, and the risks of noncompliance. There is a brief discussion on traveling with oxygen and resources the patient may utilize. Flowsheet Row PULMONARY REHAB CHRONIC OBSTRUCTIVE PULMONARY DISEASE from 01/27/2022 in [facility] for [facility], Vascular, & [facility]  Date 01/27/22  Educator RN  Instruction Review Code 1- Verbalizes Understanding       Oxygen Use Group instruction provided by PowerPoint, verbal discussion, and written material to discuss how supplemental oxygen is prescribed and different types of oxygen supply systems. Resources for more information are provided.    Breathing Techniques Group instruction that is supported by demonstration and informational handouts. Instructor discusses the benefits of pursed lip and diaphragmatic breathing and detailed demonstration on how to perform both.  Flowsheet Row PULMONARY REHAB CHRONIC OBSTRUCTIVE PULMONARY DISEASE from 02/10/2022 in [facility] for Heart, Vascular, & [place]  Date 02/10/22  Educator EP  Instruction Review Code 1- Verbalizes Understanding  [Handout also provided]        Risk Factor Reduction Group instruction that is supported by a PowerPoint presentation. Instructor discusses the definition of a risk factor, different risk factors for pulmonary disease, and how the heart and lungs work together. Flowsheet Row PULMONARY REHAB CHRONIC OBSTRUCTIVE PULMONARY DISEASE from 12/30/2021 in [facility] for Heart, Vascular, & [place]  Date 12/16/21  Educator EP  Instruction Review Code 1-  Verbalizes Understanding       MD Day A group question and answer session with a medical doctor that allows participants to ask questions that relate to their pulmonary disease state.   Nutrition for the Pulmonary Patient Group instruction provided by PowerPoint slides, verbal discussion, and written materials to support subject matter. The instructor gives an explanation and review of healthy diet recommendations, which includes a discussion on weight management, recommendations for fruit and vegetable consumption, as well as protein, fluid, caffeine, fiber, sodium, sugar, and alcohol. Tips for eating when patients are short of breath are discussed.    Other Education Group or individual verbal, written, or video instructions that support the educational goals of the pulmonary rehab program. Flowsheet Row PULMONARY REHAB CHRONIC OBSTRUCTIVE PULMONARY DISEASE from 12/30/2021 in [facility] for Heart, Vascular, & Lung Health  Date 12/09/21  [Stress and Energy]  Educator EP  Instruction Review Code 1- Verbalizes Understanding        Knowledge Questionnaire Score:  Knowledge Questionnaire Score - 11/22/21 1416       Knowledge Questionnaire Score   Pre Score 17/18             Core Components/Risk Factors/Patient Goals at Admission:  Personal Goals and Risk Factors at Admission - 11/22/21 1419       Core Components/Risk Factors/Patient Goals on Admission    Weight Management Weight Loss    Tobacco Cessation Yes    Number of packs per day only occasional puff currently    Intervention Advice worker, assist with  locating and accessing local/national Quit Smoking programs, and support quit date choice.;Assist the participant in steps to quit. Provide individualized education and counseling about committing to Tobacco Cessation, relapse prevention, and pharmacological support that can be provided by physician.    Expected Outcomes Short  Term: Will quit all tobacco product use, adhering to prevention of relapse plan.;Long Term: Complete abstinence from all tobacco products for at least 12 months from quit date.    Improve shortness of breath with ADL's Yes    Intervention Provide education, individualized exercise plan and daily activity instruction to help decrease symptoms of SOB with activities of daily living.    Expected Outcomes Short Term: Improve cardiorespiratory fitness to achieve a reduction of symptoms when performing ADLs;Long Term: Be able to perform more ADLs without symptoms or delay the onset of symptoms             Core Components/Risk Factors/Patient Goals Review:   Goals and Risk Factor Review     Row Name 11/24/21 1043 12/22/21 1327 01/19/22 1049 02/09/22 1608       Core Components/Risk Factors/Patient Goals Review   Personal Goals Review Weight Management/Obesity;Tobacco Cessation;Improve shortness of breath with ADL's;Develop more efficient breathing techniques such as purse lipped breathing and diaphragmatic breathing and practicing self-pacing with activity. Weight Management/Obesity;Tobacco Cessation;Improve shortness of breath with ADL's;Develop more efficient breathing techniques such as purse lipped breathing and diaphragmatic breathing and practicing self-pacing with activity. Weight Management/Obesity;Tobacco Cessation;Improve shortness of breath with ADL's;Develop more efficient breathing techniques such as purse lipped breathing and diaphragmatic breathing and practicing self-pacing with activity. Weight Management/Obesity;Tobacco Cessation;Improve shortness of breath with ADL's;Develop more efficient breathing techniques such as purse lipped breathing and diaphragmatic breathing and practicing self-pacing with activity.;Stress    Review Vickey is starting the program 11/30/21 at 1:15 pm. We will continue to monitor her progression towards her core components. Donasia's weights have been stable no  loss or gains. She has attended the breathing techniques education. No change in her tobacco use occasional puffs. She is exercising on the octane and recumbent bike. She has had increasung workloads and METS on both. She is reporting mild to mild with difficulity SOB and her oxygen saturation have been 93-96% with exercising. Kamala has had perfect attendance since starting the program and has completed 14 sessions. Despite working full time Colandra has arranged to attend PR during her lunch break. Levi is currently exercising on the recumbent elliptical and the recumbent bike. She has increased both her workload and her METS. She is independently using pursed lip breathing when she gets short of breath and can verbalize her rate of perceived exertion and dyspnea scale to staff. She is maintaining her oxygen saturation on room air from the mid to high 90s. Meloney has gained weight since starting PR. She is working with our dietitian to lose weight. She had acknowledged that she eats when she is stressed. Having recently quit smoking, she states she has turned to food. We will continue to assess, educated and monitor Kinzly and her goals in PR class. Jazleen has had perfect attendance since starting the program and has completed 19 sessions. Despite working full time Katoria has arranged to attend PR during her lunch break. Sherrylee is currently exercising on the recumbent elliptical and the recumbent bike. She has increased both her workload and her METS. She is independently using pursed lip breathing when she gets short of breath and can verbalize her rate of perceived exertion and dyspnea scale to staff. Jarielys has  attended the breathing technique, exercise for the pulmonary patient, oxygen safety, risk factor reduction and stress and energy classes. She is maintaining her oxygen saturation on room air from the mid to high 90s. Zaima has gained weight since starting PR. She is working with our dietitian to lose  weight. She had acknowledged that she eats when she is stressed. Having recently quit smoking, she states she has turned to food. Julianys likes coming to PR and feels like it is helping with her endurance. We will continue to assess, educate and monitor Lonzetta and her goals in PR class.    Expected Outcomes See goals admission goals See admission goals. See admission goals. See admission goals.             Core Components/Risk Factors/Patient Goals at Discharge (Final Review):   Goals and Risk Factor Review - 02/09/22 1608       Core Components/Risk Factors/Patient Goals Review   Personal Goals Review Weight Management/Obesity;Tobacco Cessation;Improve shortness of breath with ADL's;Develop more efficient breathing techniques such as purse lipped breathing and diaphragmatic breathing and practicing self-pacing with activity.;Stress    Review Zanasia has had perfect attendance since starting the program and has completed 19 sessions. Despite working full time Lorece has arranged to attend PR during her lunch break. Aanika is currently exercising on the recumbent elliptical and the recumbent bike. She has increased both her workload and her METS. She is independently using pursed lip breathing when she gets short of breath and can verbalize her rate of perceived exertion and dyspnea scale to staff. Jayleen has attended the breathing technique, exercise for the pulmonary patient, oxygen safety, risk factor reduction and stress and energy classes. She is maintaining her oxygen saturation on room air from the mid to high 90s. Ariss has gained weight since starting PR. She is working with our dietitian to lose weight. She had acknowledged that she eats when she is stressed. Having recently quit smoking, she states she has turned to food. Merika likes coming to PR and feels like it is helping with her endurance. We will continue to assess, educate and monitor Mykelti and her goals in PR class.    Expected  Outcomes See admission goals.             ITP Comments:   Comments: Dr. Rodman Pickle is Medical Director for Pulmonary Rehab at [facility].

## 2022-02-17 ENCOUNTER — Encounter (HOSPITAL_COMMUNITY)
Admission: RE | Admit: 2022-02-17 | Discharge: 2022-02-17 | Disposition: A | Discharge status: Home / Self Care | Payer: 59 | Source: Ambulatory Visit | Attending: Internal Medicine | Admitting: Internal Medicine

## 2022-02-17 DIAGNOSIS — J449 Chronic obstructive pulmonary disease, unspecified: Secondary | ICD-10-CM | POA: Diagnosis not present

## 2022-02-22 ENCOUNTER — Encounter (HOSPITAL_COMMUNITY)
Admission: RE | Admit: 2022-02-22 | Discharge: 2022-02-22 | Disposition: A | Discharge status: Home / Self Care | Payer: 59 | Source: Ambulatory Visit | Attending: Internal Medicine | Admitting: Internal Medicine

## 2022-02-22 DIAGNOSIS — J449 Chronic obstructive pulmonary disease, unspecified: Secondary | ICD-10-CM

## 2022-02-22 NOTE — Progress Notes (Signed)
Daily Session Note  Patient Details  Name: Jill Glenn MRN: [id number] Date of Birth: [birth year] Referring Provider:   [name] Pulmonary Rehab Walk Test from 11/22/2021 in [facility] for Heart, Vascular, & [place]  Referring Provider [name]       Encounter Date: 02/22/2022  Check In:  Session Check In - 02/22/22 1435       Check-In   Supervising physician immediately available to respond to emergencies [facility] MD immediately available    Physician(s) Eric Form, NP    Location [facility]    Staff Present Janine Ores, RN, Quentin Ore, MS, ACSM-CEP, Exercise Physiologist;Carlette Wilber Oliphant, RN, BSN;Randi Reeve BS, ACSM-CEP, Exercise Physiologist;Samantha [place], RD, LDN;Other    Virtual Visit No    Medication changes reported     No    Fall or balance concerns reported    No    Tobacco Cessation No Change    Warm-up and Cool-down Performed as group-led instruction    Resistance Training Performed Yes    VAD Patient? No    PAD/SET Patient? No      Pain Assessment   Currently in Pain? No/denies    Multiple Pain Sites No             Capillary Blood Glucose: No results found for this or any previous visit (from the past 24 hour(s)).    Social History   Tobacco Use  Smoking Status Former   Packs/day: 1.00   Years: 40.00   Total pack years: 40.00   Types: Cigarettes   Start date: 03/05/1981   Quit date: 10/14/2021   Years since quitting: 0.3  Smokeless Tobacco Never    Goals Met:  Independence with exercise equipment Improved SOB with ADL's Exercise tolerated well No report of concerns or symptoms today Strength training completed today  Goals Unmet:  Not Applicable  Comments: Service time is from 1320 to [place]    Dr. Rodman Pickle is Medical Director for Pulmonary Rehab at [facility].

## 2022-02-24 ENCOUNTER — Encounter (HOSPITAL_COMMUNITY)
Admission: RE | Admit: 2022-02-24 | Discharge: 2022-02-24 | Disposition: A | Discharge status: Home / Self Care | Payer: 59 | Source: Ambulatory Visit | Attending: Internal Medicine | Admitting: Internal Medicine

## 2022-02-24 DIAGNOSIS — J449 Chronic obstructive pulmonary disease, unspecified: Secondary | ICD-10-CM | POA: Diagnosis not present

## 2022-02-24 NOTE — Progress Notes (Signed)
Daily Session Note  Patient Details  Name: DASHIYA DAM MRN: [id number] Date of Birth: 29-Dec-1965 Referring Provider:   [name] Pulmonary Rehab Walk Test from 11/22/2021 in [facility] for Heart, Vascular, & [place]  Referring Provider [name]       Encounter Date: 02/24/2022  Check In:  Session Check In - 02/24/22 1335       Check-In   Supervising physician immediately available to respond to emergencies [facility] MD immediately available    Physician(s) Shalhoub    Location [facility]    Staff Present Janine Ores, RN, Quentin Ore, MS, ACSM-CEP, Exercise Physiologist;Randi Olen Cordial BS, ACSM-CEP, Exercise Physiologist;Samantha [place], RD, LDN;Other    Virtual Visit No    Medication changes reported     No   used rescue inhaler   Fall or balance concerns reported    No    Tobacco Cessation No Change    Warm-up and Cool-down Performed as group-led instruction    Resistance Training Performed Yes    VAD Patient? No    PAD/SET Patient? No      Pain Assessment   Currently in Pain? Yes    Pain Score 3     Pain Location Back    Pain Orientation Right;Lower    Pain Descriptors / Indicators Aching    Pain Onset More than a month ago    Pain Frequency Intermittent    Multiple Pain Sites No             Capillary Blood Glucose: No results found for this or any previous visit (from the past 24 hour(s)).    Social History   Tobacco Use  Smoking Status Former   Packs/day: 1.00   Years: 40.00   Total pack years: 40.00   Types: Cigarettes   Start date: 03/05/1981   Quit date: 10/14/2021   Years since quitting: 0.3  Smokeless Tobacco Never    Goals Met:  Proper associated with RPD/PD & O2 Sat Independence with exercise equipment Exercise tolerated well No report of concerns or symptoms today Strength training completed today  Goals Unmet:  Not Applicable  Comments: Service time is from 1312 to 1444.    Dr.  Rodman Pickle is Medical Director for Pulmonary Rehab at [facility].

## 2022-03-01 ENCOUNTER — Encounter (HOSPITAL_COMMUNITY)
Admission: RE | Admit: 2022-03-01 | Discharge: 2022-03-01 | Disposition: A | Discharge status: Home / Self Care | Payer: 59 | Source: Ambulatory Visit | Attending: Internal Medicine | Admitting: Internal Medicine

## 2022-03-01 VITALS — Wt 162.9 lb

## 2022-03-01 DIAGNOSIS — J449 Chronic obstructive pulmonary disease, unspecified: Secondary | ICD-10-CM

## 2022-03-01 NOTE — Progress Notes (Signed)
Daily Session Note  Patient Details  Name: Jill Glenn MRN: [id number] Date of Birth: 1965/03/18 Referring Provider:   [name] Pulmonary Rehab Walk Test from 11/22/2021 in [facility] for Heart, Vascular, & [place]  Referring Provider [name]       Encounter Date: 03/01/2022  Check In:  Session Check In - 03/01/22 1429       Check-In   Supervising physician immediately available to respond to emergencies [facility] MD immediately available    Physician(s) Melina Copa, PA    Location [facility]    Staff Present Janine Ores, RN, Quentin Ore, MS, ACSM-CEP, Exercise Physiologist;Randi Yevonne Pax, ACSM-CEP, Exercise Physiologist;Samantha [place], RD, LDN;Other;Bailey Pearline Cables, MS, Exercise Physiologist    Virtual Visit No    Medication changes reported     No   used rescue inhaler   Fall or balance concerns reported    No    Tobacco Cessation No Change    Warm-up and Cool-down Performed as group-led instruction    Resistance Training Performed Yes    VAD Patient? No    PAD/SET Patient? No      Pain Assessment   Currently in Pain? No/denies    Multiple Pain Sites No             Capillary Blood Glucose: No results found for this or any previous visit (from the past 24 hour(s)).   Exercise Prescription Changes - 03/01/22 1500       Response to Exercise   Blood Pressure (Admit) 109/75    Blood Pressure (Exercise) 119/82    Blood Pressure (Exit) 112/70    Heart Rate (Admit) 81 bpm    Heart Rate (Exercise) 89 bpm    Heart Rate (Exit) 103 bpm    Oxygen Saturation (Admit) 98 %    Oxygen Saturation (Exercise) 94 %    Oxygen Saturation (Exit) 97 %    Rating of Perceived Exertion (Exercise) 13    Perceived Dyspnea (Exercise) 2    Duration Continue with 30 min of aerobic exercise without signs/symptoms of physical distress.    Intensity THRR unchanged      Progression   Progression Continue to progress workloads to maintain  intensity without signs/symptoms of physical distress.      Resistance Training   Training Prescription Yes    Weight blue bands    Reps 10-15    Time 10 Minutes      Bike   Level 6    Minutes 15    METs 5.6      Recumbant Elliptical   Level 6    Minutes 15    METs 3.9             Social History   Tobacco Use  Smoking Status Former   Packs/day: 1.00   Years: 40.00   Total pack years: 40.00   Types: Cigarettes   Start date: 03/05/1981   Quit date: 10/14/2021   Years since quitting: 0.3  Smokeless Tobacco Never    Goals Met:  Independence with exercise equipment Exercise tolerated well Personal goals reviewed No report of concerns or symptoms today Strength training completed today  Goals Unmet:  Not Applicable  Comments: Service time is from 1315 to [place]    Dr. Rodman Pickle is Medical Director for Pulmonary Rehab at [facility].

## 2022-03-03 ENCOUNTER — Encounter (HOSPITAL_COMMUNITY)
Admission: RE | Admit: 2022-03-03 | Discharge: 2022-03-03 | Disposition: A | Discharge status: Home / Self Care | Payer: 59 | Source: Ambulatory Visit | Attending: Internal Medicine | Admitting: Internal Medicine

## 2022-03-03 DIAGNOSIS — J449 Chronic obstructive pulmonary disease, unspecified: Secondary | ICD-10-CM | POA: Diagnosis not present

## 2022-03-03 NOTE — Progress Notes (Signed)
Daily Session Note  Patient Details  Name: Jill Glenn MRN: [id number] Date of Birth: [birth year] Referring Provider:   [name] Pulmonary Rehab Walk Test from 11/22/2021 in [facility] for Heart, Vascular, & Lung Health  Referring Provider [name]       Encounter Date: 03/03/2022  Check In:  Session Check In - 03/03/22 1427       Check-In   Supervising physician immediately available to respond to emergencies [facility] MD immediately available    Physician(s) Dr Cyd Silence    Location [facility]    Staff Present Janine Ores, RN, Quentin Ore, MS, ACSM-CEP, Exercise Physiologist;Frimet Durfee Yevonne Pax, ACSM-CEP, Exercise Physiologist;Samantha [place], RD, LDN;Other    Virtual Visit No    Medication changes reported     No   used rescue inhaler   Fall or balance concerns reported    No    Tobacco Cessation No Change    Warm-up and Cool-down Not performed (comment)    Resistance Training Performed No    VAD Patient? No    PAD/SET Patient? No      Pain Assessment   Currently in Pain? No/denies    Multiple Pain Sites No             Capillary Blood Glucose: No results found for this or any previous visit (from the past 24 hour(s)).    Social History   Tobacco Use  Smoking Status Former   Packs/day: 1.00   Years: 40.00   Total pack years: 40.00   Types: Cigarettes   Start date: 03/05/1981   Quit date: 10/14/2021   Years since quitting: 0.3  Smokeless Tobacco Never    Goals Met:  Improved SOB with ADL's Using PLB without cueing & demonstrates good technique Exercise tolerated well Queuing for purse lip breathing No report of concerns or symptoms today  Goals Unmet:  Not Applicable  Comments: Pt completed 6 min walk test today and graduated. Very successful in program. Service time is from 1313 to 1355.    Dr. Rodman Pickle is Medical Director for Pulmonary Rehab at [facility].

## 2022-03-07 NOTE — Progress Notes (Signed)
Discharge Progress Report  Patient Details  Name: Jill Glenn MRN: [id number] Date of Birth: 17-Oct-1965 Referring Provider:   [name] Pulmonary Rehab Walk Test from 11/22/2021 in [facility] [place] [facility] [place]  Referring Provider Desai        Number of Visits: 64  Reason for Discharge:  Patient reached a stable level of exercise. Patient independent in their exercise. Patient has met program and personal goals.  Smoking History:  Social History   Tobacco Use  Smoking Status Former   Packs/day: 1.00   Years: 40.00   Total pack years: 40.00   Types: Cigarettes   Start date: 03/05/1981   Quit date: 10/14/2021   Years since quitting: 0.3  Smokeless Tobacco Never    Diagnosis:  Stage 3 severe COPD by GOLD classification ([place])  ADL [facility]:  Pulmonary Assessment Scores     Row Name 11/22/21 1426 02/22/22 1542 03/03/22 1525     ADL [facility]   ADL Phase Entry Exit --   SOB Score total 45 40 --     CAT Score   CAT Score 24 20 --     mMRC Score   mMRC Score 2 -- 2            Initial Exercise Prescription:  Initial Exercise Prescription - 11/22/21 1500       Date of Initial Exercise RX and Referring Provider   Date 11/22/21    Referring Provider [name]    Expected Discharge Date 01/27/22      Recumbant Bike   Level 1    RPM 70    Minutes 15      Recumbant Elliptical   Level 1    RPM 70    Minutes 15      Prescription Details   Frequency (times per week) 2    Duration Progress to 30 minutes of continuous aerobic without signs/symptoms of physical distress      Intensity   THRR 40-80% of Max Heartrate 66-132    Ratings of Perceived Exertion 11-13    Perceived Dyspnea 0-4      Progression   Progression Continue progressive overload as per policy without signs/symptoms or physical distress.      Resistance Training   Training Prescription Yes    Weight blue bands    Reps 10-15              Discharge Exercise Prescription (Final Exercise Prescription Changes):  Exercise Prescription Changes - 03/01/22 1500       Response to Exercise   Blood Pressure (Admit) 109/75    Blood Pressure (Exercise) 119/82    Blood Pressure (Exit) 112/70    Heart Rate (Admit) 81 bpm    Heart Rate (Exercise) 89 bpm    Heart Rate (Exit) 103 bpm    Oxygen Saturation (Admit) 98 %    Oxygen Saturation (Exercise) 94 %    Oxygen Saturation (Exit) 97 %    Rating of Perceived Exertion (Exercise) 13    Perceived Dyspnea (Exercise) 2    Duration Continue with 30 min of aerobic exercise without signs/symptoms of physical distress.    Intensity THRR unchanged      Progression   Progression Continue to progress workloads to maintain intensity without signs/symptoms of physical distress.      Resistance Training   Training Prescription Yes    Weight blue bands    Reps 10-15    Time 10 Minutes  Bike   Level 6    Minutes 15    METs 5.6      Recumbant Elliptical   Level 6    Minutes 15    METs 3.9             Functional Capacity:  6 Minute Walk     Row Name 11/22/21 1526 03/03/22 1525       6 Minute Walk   Phase Initial Discharge    Distance 1170 feet 1550 feet    Distance % Change -- 32.5 %    Distance Feet Change -- 380 ft    Walk Time 6 minutes 6 minutes    # of Rest Breaks 0 0    MPH 2.22 2.94    METS 3.17 3.99    RPE 13 13    Perceived Dyspnea  2 2    VO2 Peak 11.09 13.97    Symptoms Yes (comment) No    Comments SOB, practiced pursed lip breathing --    Resting HR 70 bpm 86 bpm    Resting BP 104/70 118/80    Resting Oxygen Saturation  98 % 94 %    Exercise Oxygen Saturation  during 6 min walk 91 % 91 %    Max Ex. HR 80 bpm 111 bpm    Max Ex. BP 120/60 106/64    2 Minute Post BP 112/70 106/68      Interval HR   1 Minute HR 70 97    2 Minute HR 80 106    3 Minute HR 76 106    4 Minute HR 76 109    5 Minute HR 76 108    6 Minute HR 76 111    2 Minute  Post HR 71 98    Interval Heart Rate? Yes Yes      Interval Oxygen   Interval Oxygen? Yes --    Baseline Oxygen Saturation % 98 % 94 %    1 Minute Oxygen Saturation % 95 % 94 %    1 Minute Liters of Oxygen 0 L 0 L    2 Minute Oxygen Saturation % 95 % 93 %    2 Minute Liters of Oxygen 0 L 0 L    3 Minute Oxygen Saturation % 94 % 92 %    3 Minute Liters of Oxygen 0 L 0 L    4 Minute Oxygen Saturation % 91 % 91 %    4 Minute Liters of Oxygen 0 L 0 L    5 Minute Oxygen Saturation % 93 % 92 %    5 Minute Liters of Oxygen 0 L 0 L    6 Minute Oxygen Saturation % 93 % 91 %    6 Minute Liters of Oxygen 0 L 0 L    2 Minute Post Oxygen Saturation % 98 % 97 %    2 Minute Post Liters of Oxygen 0 L 0 L             Psychological, QOL, Others - Outcomes: PHQ 2/9:    02/22/2022    3:43 PM 11/22/2021    2:05 PM 03/22/2021    9:25 AM 12/21/2020    8:30 AM  Depression screen PHQ 2/9  Decreased Interest '1 1 1 '$ 0  Down, Depressed, Hopeless 0 '1 1 1  '$ PHQ - 2 Score '1 2 2 1  '$ Altered sleeping '1 2 3   '$ Tired, decreased energy '1 1 3   '$ Change in  appetite '1 2 1   '$ Feeling bad or failure about yourself  2 3 0   Trouble concentrating 0 0 0   Moving slowly or fidgety/restless 0 1 0   Suicidal thoughts 0 0 0   PHQ-9 Score '6 11 9   '$ Difficult doing work/chores Not difficult at all Somewhat difficult      Quality of Life:   Personal Goals: Goals established at orientation with interventions provided to work toward goal.  Personal Goals and Risk Factors at Admission - 11/22/21 1419       Core Components/Risk Factors/Patient Goals on Admission    Weight Management Weight Loss    Tobacco Cessation Yes    Number of packs per day only occasional puff currently    Intervention Offer self-teaching materials, assist with locating and accessing local/national Quit Smoking programs, and support quit date choice.;Assist the participant in steps to quit. Provide individualized education and counseling about  committing to Tobacco Cessation, relapse prevention, and pharmacological support that can be provided by physician.    Expected Outcomes Short Term: Will quit all tobacco product use, adhering to prevention of relapse plan.;Long Term: Complete abstinence from all tobacco products for at least 12 months from quit date.    Improve shortness of breath with ADL's Yes    Intervention Provide education, individualized exercise plan and daily activity instruction to help decrease symptoms of SOB with activities of daily living.    Expected Outcomes Short Term: Improve cardiorespiratory fitness to achieve a reduction of symptoms when performing ADLs;Long Term: Be able to perform more ADLs without symptoms or delay the onset of symptoms              Personal Goals Discharge:  Goals and Risk Factor Review     Row Name 11/24/21 1043 12/22/21 1327 01/19/22 1049 02/09/22 1608       Core Components/Risk Factors/Patient Goals Review   Personal Goals Review Weight Management/Obesity;Tobacco Cessation;Improve shortness of breath with ADL's;Develop more efficient breathing techniques such as purse lipped breathing and diaphragmatic breathing and practicing self-pacing with activity. Weight Management/Obesity;Tobacco Cessation;Improve shortness of breath with ADL's;Develop more efficient breathing techniques such as purse lipped breathing and diaphragmatic breathing and practicing self-pacing with activity. Weight Management/Obesity;Tobacco Cessation;Improve shortness of breath with ADL's;Develop more efficient breathing techniques such as purse lipped breathing and diaphragmatic breathing and practicing self-pacing with activity. Weight Management/Obesity;Tobacco Cessation;Improve shortness of breath with ADL's;Develop more efficient breathing techniques such as purse lipped breathing and diaphragmatic breathing and practicing self-pacing with activity.;Stress    Review Ilo is starting the program 11/30/21 at  1:15 pm. We will continue to monitor her progression towards her core components. Lurie's weights have been stable no loss or gains. She has attended the breathing techniques education. No change in her tobacco use occasional puffs. She is exercising on the octane and recumbent bike. She has had increasung workloads and METS on both. She is reporting mild to mild with difficulity SOB and her oxygen saturation have been 93-96% with exercising. Caty has had perfect attendance since starting the program and has completed 14 sessions. Despite working full time Davinity has arranged to attend PR during her lunch break. Ilan is currently exercising on the recumbent elliptical and the recumbent bike. She has increased both her workload and her METS. She is independently using pursed lip breathing when she gets short of breath and can verbalize her rate of perceived exertion and dyspnea scale to staff. She is maintaining her oxygen saturation on room air  from the mid to high 90s. Clorice has gained weight since starting PR. She is working with our dietitian to lose weight. She had acknowledged that she eats when she is stressed. Having recently quit smoking, she states she has turned to food. We will continue to assess, educated and monitor Jewels and her goals in PR class. Marialuiza has had perfect attendance since starting the program and has completed 19 sessions. Despite working full time Samauria has arranged to attend PR during her lunch break. Denasia is currently exercising on the recumbent elliptical and the recumbent bike. She has increased both her workload and her METS. She is independently using pursed lip breathing when she gets short of breath and can verbalize her rate of perceived exertion and dyspnea scale to staff. Maryette has attended the breathing technique, exercise for the pulmonary patient, oxygen safety, risk factor reduction and stress and energy classes. She is maintaining her oxygen saturation on room  air from the mid to high 90s. Sheenna has gained weight since starting PR. She is working with our dietitian to lose weight. She had acknowledged that she eats when she is stressed. Having recently quit smoking, she states she has turned to food. Nafisa likes coming to PR and feels like it is helping with her endurance. We will continue to assess, educate and monitor Nate and her goals in PR class.    Expected Outcomes See goals admission goals See admission goals. See admission goals. See admission goals.             Exercise Goals and Review:  Exercise Goals     Row Name 11/22/21 1519 11/24/21 1259 12/21/21 0732 01/14/22 1514 02/07/22 1103     Exercise Goals   Increase Physical Activity Yes Yes Yes Yes Yes   Intervention Provide advice, education, support and counseling about physical activity/exercise needs.;Develop an individualized exercise prescription for aerobic and resistive training based on initial evaluation findings, risk stratification, comorbidities and participant's personal goals. Provide advice, education, support and counseling about physical activity/exercise needs.;Develop an individualized exercise prescription for aerobic and resistive training based on initial evaluation findings, risk stratification, comorbidities and participant's personal goals. Provide advice, education, support and counseling about physical activity/exercise needs.;Develop an individualized exercise prescription for aerobic and resistive training based on initial evaluation findings, risk stratification, comorbidities and participant's personal goals. Provide advice, education, support and counseling about physical activity/exercise needs.;Develop an individualized exercise prescription for aerobic and resistive training based on initial evaluation findings, risk stratification, comorbidities and participant's personal goals. Provide advice, education, support and counseling about physical  activity/exercise needs.;Develop an individualized exercise prescription for aerobic and resistive training based on initial evaluation findings, risk stratification, comorbidities and participant's personal goals.   Expected Outcomes Short Term: Attend rehab on a regular basis to increase amount of physical activity.;Long Term: Exercising regularly at least 3-5 days a week.;Long Term: Add in home exercise to make exercise part of routine and to increase amount of physical activity. Short Term: Attend rehab on a regular basis to increase amount of physical activity.;Long Term: Exercising regularly at least 3-5 days a week.;Long Term: Add in home exercise to make exercise part of routine and to increase amount of physical activity. Short Term: Attend rehab on a regular basis to increase amount of physical activity.;Long Term: Exercising regularly at least 3-5 days a week.;Long Term: Add in home exercise to make exercise part of routine and to increase amount of physical activity. Short Term: Attend rehab on a regular basis to  increase amount of physical activity.;Long Term: Exercising regularly at least 3-5 days a week.;Long Term: Add in home exercise to make exercise part of routine and to increase amount of physical activity. Short Term: Attend rehab on a regular basis to increase amount of physical activity.;Long Term: Exercising regularly at least 3-5 days a week.;Long Term: Add in home exercise to make exercise part of routine and to increase amount of physical activity.   Increase Strength and Stamina Yes Yes Yes Yes Yes   Intervention Provide advice, education, support and counseling about physical activity/exercise needs.;Develop an individualized exercise prescription for aerobic and resistive training based on initial evaluation findings, risk stratification, comorbidities and participant's personal goals. Provide advice, education, support and counseling about physical activity/exercise needs.;Develop  an individualized exercise prescription for aerobic and resistive training based on initial evaluation findings, risk stratification, comorbidities and participant's personal goals. Provide advice, education, support and counseling about physical activity/exercise needs.;Develop an individualized exercise prescription for aerobic and resistive training based on initial evaluation findings, risk stratification, comorbidities and participant's personal goals. Provide advice, education, support and counseling about physical activity/exercise needs.;Develop an individualized exercise prescription for aerobic and resistive training based on initial evaluation findings, risk stratification, comorbidities and participant's personal goals. Provide advice, education, support and counseling about physical activity/exercise needs.;Develop an individualized exercise prescription for aerobic and resistive training based on initial evaluation findings, risk stratification, comorbidities and participant's personal goals.   Expected Outcomes Short Term: Increase workloads from initial exercise prescription for resistance, speed, and METs.;Short Term: Perform resistance training exercises routinely during rehab and add in resistance training at home;Long Term: Improve cardiorespiratory fitness, muscular endurance and strength as measured by increased METs and functional capacity (6MWT) Short Term: Increase workloads from initial exercise prescription for resistance, speed, and METs.;Short Term: Perform resistance training exercises routinely during rehab and add in resistance training at home;Long Term: Improve cardiorespiratory fitness, muscular endurance and strength as measured by increased METs and functional capacity (6MWT) Short Term: Increase workloads from initial exercise prescription for resistance, speed, and METs.;Short Term: Perform resistance training exercises routinely during rehab and add in resistance training at  home;Long Term: Improve cardiorespiratory fitness, muscular endurance and strength as measured by increased METs and functional capacity (6MWT) Short Term: Increase workloads from initial exercise prescription for resistance, speed, and METs.;Short Term: Perform resistance training exercises routinely during rehab and add in resistance training at home;Long Term: Improve cardiorespiratory fitness, muscular endurance and strength as measured by increased METs and functional capacity (6MWT) Short Term: Increase workloads from initial exercise prescription for resistance, speed, and METs.;Short Term: Perform resistance training exercises routinely during rehab and add in resistance training at home;Long Term: Improve cardiorespiratory fitness, muscular endurance and strength as measured by increased METs and functional capacity (6MWT)   Able to understand and use rate of perceived exertion (RPE) scale Yes Yes Yes Yes Yes   Intervention Provide education and explanation on how to use RPE scale Provide education and explanation on how to use RPE scale Provide education and explanation on how to use RPE scale Provide education and explanation on how to use RPE scale Provide education and explanation on how to use RPE scale   Expected Outcomes Short Term: Able to use RPE daily in rehab to express subjective intensity level;Long Term:  Able to use RPE to guide intensity level when exercising independently Short Term: Able to use RPE daily in rehab to express subjective intensity level;Long Term:  Able to use RPE to guide intensity level when  exercising independently Short Term: Able to use RPE daily in rehab to express subjective intensity level;Long Term:  Able to use RPE to guide intensity level when exercising independently Short Term: Able to use RPE daily in rehab to express subjective intensity level;Long Term:  Able to use RPE to guide intensity level when exercising independently Short Term: Able to use RPE daily  in rehab to express subjective intensity level;Long Term:  Able to use RPE to guide intensity level when exercising independently   Able to understand and use Dyspnea scale Yes Yes Yes Yes Yes   Intervention Provide education and explanation on how to use Dyspnea scale Provide education and explanation on how to use Dyspnea scale Provide education and explanation on how to use Dyspnea scale Provide education and explanation on how to use Dyspnea scale Provide education and explanation on how to use Dyspnea scale   Expected Outcomes Short Term: Able to use Dyspnea scale daily in rehab to express subjective sense of shortness of breath during exertion;Long Term: Able to use Dyspnea scale to guide intensity level when exercising independently Short Term: Able to use Dyspnea scale daily in rehab to express subjective sense of shortness of breath during exertion;Long Term: Able to use Dyspnea scale to guide intensity level when exercising independently Short Term: Able to use Dyspnea scale daily in rehab to express subjective sense of shortness of breath during exertion;Long Term: Able to use Dyspnea scale to guide intensity level when exercising independently Short Term: Able to use Dyspnea scale daily in rehab to express subjective sense of shortness of breath during exertion;Long Term: Able to use Dyspnea scale to guide intensity level when exercising independently Short Term: Able to use Dyspnea scale daily in rehab to express subjective sense of shortness of breath during exertion;Long Term: Able to use Dyspnea scale to guide intensity level when exercising independently   Knowledge and understanding of Target Heart Rate Range (THRR) Yes Yes Yes Yes Yes   Intervention Provide education and explanation of THRR including how the numbers were predicted and where they are located for reference Provide education and explanation of THRR including how the numbers were predicted and where they are located for reference  Provide education and explanation of THRR including how the numbers were predicted and where they are located for reference Provide education and explanation of THRR including how the numbers were predicted and where they are located for reference Provide education and explanation of THRR including how the numbers were predicted and where they are located for reference   Expected Outcomes Short Term: Able to state/look up THRR;Long Term: Able to use THRR to govern intensity when exercising independently;Short Term: Able to use daily as guideline for intensity in rehab Short Term: Able to state/look up THRR;Long Term: Able to use THRR to govern intensity when exercising independently;Short Term: Able to use daily as guideline for intensity in rehab Short Term: Able to state/look up THRR;Long Term: Able to use THRR to govern intensity when exercising independently;Short Term: Able to use daily as guideline for intensity in rehab Short Term: Able to state/look up THRR;Long Term: Able to use THRR to govern intensity when exercising independently;Short Term: Able to use daily as guideline for intensity in rehab Short Term: Able to state/look up THRR;Long Term: Able to use THRR to govern intensity when exercising independently;Short Term: Able to use daily as guideline for intensity in rehab   Understanding of Exercise Prescription Yes Yes Yes Yes Yes   Intervention Provide education,  explanation, and written materials on patient's individual exercise prescription Provide education, explanation, and written materials on patient's individual exercise prescription Provide education, explanation, and written materials on patient's individual exercise prescription Provide education, explanation, and written materials on patient's individual exercise prescription Provide education, explanation, and written materials on patient's individual exercise prescription   Expected Outcomes Short Term: Able to explain program  exercise prescription;Long Term: Able to explain home exercise prescription to exercise independently Short Term: Able to explain program exercise prescription;Long Term: Able to explain home exercise prescription to exercise independently Short Term: Able to explain program exercise prescription;Long Term: Able to explain home exercise prescription to exercise independently Short Term: Able to explain program exercise prescription;Long Term: Able to explain home exercise prescription to exercise independently Short Term: Able to explain program exercise prescription;Long Term: Able to explain home exercise prescription to exercise independently            Exercise Goals Re-Evaluation:  Exercise Goals Re-Evaluation     Row Name 11/24/21 1259 12/21/21 0732 01/14/22 1514 02/07/22 1103       Exercise Goal Re-Evaluation   Exercise Goals Review Increase Physical Activity;Increase Strength and Stamina;Able to understand and use rate of perceived exertion (RPE) scale;Able to understand and use Dyspnea scale;Knowledge and understanding of Target Heart Rate Range (THRR);Understanding of Exercise Prescription Increase Physical Activity;Increase Strength and Stamina;Able to understand and use rate of perceived exertion (RPE) scale;Able to understand and use Dyspnea scale;Knowledge and understanding of Target Heart Rate Range (THRR);Understanding of Exercise Prescription Increase Physical Activity;Increase Strength and Stamina;Able to understand and use rate of perceived exertion (RPE) scale;Able to understand and use Dyspnea scale;Knowledge and understanding of Target Heart Rate Range (THRR);Understanding of Exercise Prescription Increase Physical Activity;Increase Strength and Stamina;Able to understand and use rate of perceived exertion (RPE) scale;Able to understand and use Dyspnea scale;Knowledge and understanding of Target Heart Rate Range (THRR);Understanding of Exercise Prescription    Comments Pt will  begin exercise next week. Will monitor for progression. Pt has completed 6 exercise sessions. She is motivated and has not missed sessions. She is exercising on the recumbent elliptical for 15 min at level 3, METs 2.9 (however was talking this day and has had 3.4 METs). Then she exercises on the recumbent bike for 15 min at level 3, METs 3.9. She performs warm up and cool down independently. She is exercising at home.  Will monitor for continued progression. Pt has completed 13 exercise sessions. She is motivated and has not missed sessions. She is exercising on the recumbent elliptical for 15 min at level 4, METs 4.1. Then she exercises on the recumbent bike for 15 min at level 4, METs 5.0. She performs warm up and cool down independently. She is exercising at home.  We just added exercise sessions at pts request. Will discuss trying Scibike next session. Will monitor for continued progression. Pt has completed 18 exercise sessions. She is motivated and infrequently misses sessions.  She is exercising on the recumbent elliptical for 15 min at level 5, with max MET 5.4. She had a difficult breathing day last session with only 2.7 METs Then she exercises on the scifit bike (upright) for 15 min at level 5, METs 3.7. She performs warm up and cool down independently. She is exercising at home.  We just added exercise sessions at pts request. Transitioned to Scifit bike easily. Will monitor for continued progression.    Expected Outcomes Through exercise at rehab and home, the patient will decrease shortness of breath  with daily activities and feel confident in carrying out an exercise regimen at home. Through exercise at rehab and home, the patient will decrease shortness of breath with daily activities and feel confident in carrying out an exercise regimen at home. Through exercise at rehab and home, the patient will decrease shortness of breath with daily activities and feel confident in carrying out an exercise  regimen at home. Through exercise at rehab and home, the patient will decrease shortness of breath with daily activities and feel confident in carrying out an exercise regimen at home.             Nutrition & Weight - Outcomes:  Pre Biometrics - 11/22/21 1529       Pre Biometrics   Grip Strength 26 kg             Post Biometrics - 03/03/22 1525        Post  Biometrics   Grip Strength 26 kg             Nutrition:  Nutrition Therapy & Goals - 02/10/22 1613       Nutrition Therapy   Diet General Healthy Diet      Personal Nutrition Goals   Nutrition Goal Patient to improve dietary quality by using the plate method as a daily guide for meal planning to include lean protein/plant protein, fruits, vegetables, whole grains, and nonfat/low fat dairy as part of well balanced diet    Personal Goal #2 Patient to identify strategies for weight loss of 0.5-2.0# per week of weight loss.    Comments Shmeka requested information toward establishing new PCP. She does report snacking on refined carbohydrates and eating in the middle of the night; she was receptive to information regarding behavioral health resources. She is up 6.8# since starting with our program and up 34# since March 2023. Armonie would continue to benefit from weight loss and decrease intake of refined carbohydrates to support pulmonary disease.      Intervention Plan   Intervention Prescribe, educate and counsel regarding individualized specific dietary modifications aiming towards targeted core components such as weight, hypertension, lipid management, diabetes, heart failure and other comorbidities.;Nutrition handout(s) given to patient.    Expected Outcomes Short Term Goal: Understand basic principles of dietary content, such as calories, fat, sodium, cholesterol and nutrients.;Long Term Goal: Adherence to prescribed nutrition plan.             Nutrition Discharge:  Nutrition Assessments - 02/24/22 1152        Rate Your Plate Scores   Post Score 50             Education Questionnaire Score:  Knowledge Questionnaire Score - 11/22/21 1416       Knowledge Questionnaire Score   Pre Score 17/18             Goals reviewed with patient; copy given to patient.

## 2022-03-21 ENCOUNTER — Telehealth: Payer: Self-pay | Admitting: Internal Medicine

## 2022-03-21 NOTE — Telephone Encounter (Signed)
Does she have a way to check her vitals at home? I can't really advise without her being evaluated. Recommend follow up with PCP or she can also see [place] if she thinks it's related to her lungs, but needs appointment.

## 2022-03-21 NOTE — Telephone Encounter (Signed)
PT has very bad  dizziness esp after walking and standing a long time. This is followed with nausea. No appts avail today when I offered her an appt. Sever COPD  [phone]

## 2022-03-21 NOTE — Telephone Encounter (Signed)
Called and spoke with patient. Patient stated that her dizziness started last Thursday. Patient stated that she was extremely nauseated and couldn't keep anything down when the dizziness started. She stated that she has gotten slightly better but she is still dealing with the dizziness. Patient stated that the nausea happens only when she gets dizzy. Patient also stated that she has fell into the wall one time because of how dizzy she was.   ND, please advise.

## 2022-03-21 NOTE — Telephone Encounter (Signed)
PT calling again. No CB yet regarding Dr. advise. Let her know Dr's w/PT throughout day and usually will have time to rev & ret calls and get back to PT closer to end of day. PT totally understands.

## 2022-03-22 NOTE — Telephone Encounter (Signed)
Called and spoke with patient. Patient stated that she does not have a PCP and wants to know what should she do.   ND, please advise.

## 2022-03-22 NOTE — Telephone Encounter (Signed)
Pt called back and said she was able to get an appt scheduled with her previous primary care doctor, Dr. Scarlette Calico, and has an appt scheduled tomorrow 3/20. Nothing further needed.

## 2022-03-23 ENCOUNTER — Emergency Department (HOSPITAL_COMMUNITY): Payer: 59

## 2022-03-23 ENCOUNTER — Ambulatory Visit (INDEPENDENT_AMBULATORY_CARE_PROVIDER_SITE_OTHER): Payer: 59

## 2022-03-23 ENCOUNTER — Encounter (HOSPITAL_COMMUNITY): Payer: Self-pay | Admitting: Emergency Medicine

## 2022-03-23 ENCOUNTER — Ambulatory Visit: Payer: 59 | Admitting: Internal Medicine

## 2022-03-23 ENCOUNTER — Inpatient Hospital Stay (HOSPITAL_COMMUNITY)
Admission: EM | Admit: 2022-03-23 | Discharge: 2022-03-26 | DRG: 149 | Disposition: A | Discharge status: Home / Self Care | Payer: 59 | Source: Ambulatory Visit | Attending: Internal Medicine | Admitting: Internal Medicine

## 2022-03-23 VITALS — BP 118/78 | HR 72 | Temp 97.6°F | Ht 63.0 in

## 2022-03-23 DIAGNOSIS — J431 Panlobular emphysema: Secondary | ICD-10-CM | POA: Diagnosis not present

## 2022-03-23 DIAGNOSIS — H8113 Benign paroxysmal vertigo, bilateral: Secondary | ICD-10-CM | POA: Diagnosis not present

## 2022-03-23 DIAGNOSIS — R051 Acute cough: Secondary | ICD-10-CM | POA: Diagnosis not present

## 2022-03-23 DIAGNOSIS — M4802 Spinal stenosis, cervical region: Secondary | ICD-10-CM | POA: Diagnosis present

## 2022-03-23 DIAGNOSIS — J449 Chronic obstructive pulmonary disease, unspecified: Secondary | ICD-10-CM | POA: Diagnosis present

## 2022-03-23 DIAGNOSIS — J418 Mixed simple and mucopurulent chronic bronchitis: Secondary | ICD-10-CM

## 2022-03-23 DIAGNOSIS — J441 Chronic obstructive pulmonary disease with (acute) exacerbation: Secondary | ICD-10-CM | POA: Diagnosis not present

## 2022-03-23 DIAGNOSIS — R42 Dizziness and giddiness: Secondary | ICD-10-CM | POA: Diagnosis not present

## 2022-03-23 DIAGNOSIS — F17210 Nicotine dependence, cigarettes, uncomplicated: Secondary | ICD-10-CM | POA: Diagnosis present

## 2022-03-23 DIAGNOSIS — F17200 Nicotine dependence, unspecified, uncomplicated: Secondary | ICD-10-CM | POA: Diagnosis present

## 2022-03-23 DIAGNOSIS — H8110 Benign paroxysmal vertigo, unspecified ear: Secondary | ICD-10-CM

## 2022-03-23 DIAGNOSIS — Z7951 Long term (current) use of inhaled steroids: Secondary | ICD-10-CM

## 2022-03-23 DIAGNOSIS — Z1211 Encounter for screening for malignant neoplasm of colon: Secondary | ICD-10-CM

## 2022-03-23 DIAGNOSIS — R079 Chest pain, unspecified: Secondary | ICD-10-CM

## 2022-03-23 DIAGNOSIS — Z90710 Acquired absence of both cervix and uterus: Secondary | ICD-10-CM

## 2022-03-23 DIAGNOSIS — R262 Difficulty in walking, not elsewhere classified: Secondary | ICD-10-CM | POA: Diagnosis present

## 2022-03-23 DIAGNOSIS — G43909 Migraine, unspecified, not intractable, without status migrainosus: Secondary | ICD-10-CM | POA: Diagnosis present

## 2022-03-23 DIAGNOSIS — G4700 Insomnia, unspecified: Secondary | ICD-10-CM | POA: Diagnosis present

## 2022-03-23 DIAGNOSIS — R270 Ataxia, unspecified: Secondary | ICD-10-CM

## 2022-03-23 DIAGNOSIS — Z8669 Personal history of other diseases of the nervous system and sense organs: Secondary | ICD-10-CM

## 2022-03-23 DIAGNOSIS — M47812 Spondylosis without myelopathy or radiculopathy, cervical region: Secondary | ICD-10-CM | POA: Diagnosis present

## 2022-03-23 DIAGNOSIS — Z801 Family history of malignant neoplasm of trachea, bronchus and lung: Secondary | ICD-10-CM

## 2022-03-23 DIAGNOSIS — G43E19 Chronic migraine with aura, intractable, without status migrainosus: Secondary | ICD-10-CM

## 2022-03-23 DIAGNOSIS — F419 Anxiety disorder, unspecified: Secondary | ICD-10-CM | POA: Diagnosis present

## 2022-03-23 DIAGNOSIS — Z825 Family history of asthma and other chronic lower respiratory diseases: Secondary | ICD-10-CM

## 2022-03-23 DIAGNOSIS — Z885 Allergy status to narcotic agent status: Secondary | ICD-10-CM

## 2022-03-23 LAB — DIFFERENTIAL
Abs Immature Granulocytes: 0.07 10*3/uL (ref 0.00–0.07)
Basophils Absolute: 0.1 10*3/uL (ref 0.0–0.1)
Basophils Relative: 1 %
Eosinophils Absolute: 0.3 10*3/uL (ref 0.0–0.5)
Eosinophils Relative: 2 %
Immature Granulocytes: 1 %
Lymphocytes Relative: 28 %
Lymphs Abs: 3.3 10*3/uL (ref 0.7–4.0)
Monocytes Absolute: 0.6 10*3/uL (ref 0.1–1.0)
Monocytes Relative: 5 %
Neutro Abs: 7.4 10*3/uL (ref 1.7–7.7)
Neutrophils Relative %: 63 %

## 2022-03-23 LAB — CBC WITH DIFFERENTIAL/PLATELET
Basophils Absolute: 0 10*3/uL (ref 0.0–0.1)
Basophils Relative: 0.5 % (ref 0.0–3.0)
Eosinophils Absolute: 0.3 10*3/uL (ref 0.0–0.7)
Eosinophils Relative: 3.2 % (ref 0.0–5.0)
HCT: 44.6 % (ref 36.0–46.0)
Hemoglobin: 14.9 g/dL (ref 12.0–15.0)
Lymphocytes Relative: 23.8 % (ref 12.0–46.0)
Lymphs Abs: 2.4 10*3/uL (ref 0.7–4.0)
MCHC: 33.5 g/dL (ref 30.0–36.0)
MCV: 87.8 fl (ref 78.0–100.0)
Monocytes Absolute: 0.7 10*3/uL (ref 0.1–1.0)
Monocytes Relative: 6.5 % (ref 3.0–12.0)
Neutro Abs: 6.8 10*3/uL (ref 1.4–7.7)
Neutrophils Relative %: 66 % (ref 43.0–77.0)
Platelets: 384 10*3/uL (ref 150.0–400.0)
RBC: 5.08 Mil/uL (ref 3.87–5.11)
RDW: 13.9 % (ref 11.5–15.5)
WBC: 10.3 10*3/uL (ref 4.0–10.5)

## 2022-03-23 LAB — BASIC METABOLIC PANEL
BUN: 18 mg/dL (ref 6–23)
CO2: 25 mEq/L (ref 19–32)
Calcium: 10 mg/dL (ref 8.4–10.5)
Chloride: 103 mEq/L (ref 96–112)
Creatinine, Ser: 0.71 mg/dL (ref 0.40–1.20)
GFR: 95.3 mL/min (ref >60.00)
Glucose, Bld: 92 mg/dL (ref 70–99)
Potassium: 3.9 mEq/L (ref 3.5–5.1)
Sodium: 137 mEq/L (ref 135–145)

## 2022-03-23 LAB — URINALYSIS, ROUTINE W REFLEX MICROSCOPIC
Bilirubin Urine: NEGATIVE
Glucose, UA: NEGATIVE mg/dL
Hgb urine dipstick: NEGATIVE
Ketones, ur: NEGATIVE mg/dL
Leukocytes,Ua: NEGATIVE
Nitrite: POSITIVE — AB
Protein, ur: NEGATIVE mg/dL
Specific Gravity, Urine: 1.009 (ref 1.005–1.030)
pH: 5 (ref 5.0–8.0)

## 2022-03-23 LAB — D-DIMER, QUANTITATIVE: D-Dimer, Quant: <0.19 mcg/mL FEU (ref <0.50)

## 2022-03-23 LAB — COMPREHENSIVE METABOLIC PANEL
ALT: 21 U/L (ref 0–44)
AST: 24 U/L (ref 15–41)
Albumin: 4.7 g/dL (ref 3.5–5.0)
Alkaline Phosphatase: 54 U/L (ref 38–126)
Anion gap: 11 (ref 5–15)
BUN: 18 mg/dL (ref 6–20)
CO2: 22 mmol/L (ref 22–32)
Calcium: 9.2 mg/dL (ref 8.9–10.3)
Chloride: 101 mmol/L (ref 98–111)
Creatinine, Ser: 0.77 mg/dL (ref 0.44–1.00)
GFR, Estimated: >60 mL/min (ref >60)
Glucose, Bld: 159 mg/dL — ABNORMAL HIGH (ref 70–99)
Potassium: 3.7 mmol/L (ref 3.5–5.1)
Sodium: 134 mmol/L — ABNORMAL LOW (ref 135–145)
Total Bilirubin: 0.7 mg/dL (ref 0.3–1.2)
Total Protein: 7.7 g/dL (ref 6.5–8.1)

## 2022-03-23 LAB — CBC
HCT: 46.7 % — ABNORMAL HIGH (ref 36.0–46.0)
Hemoglobin: 15.5 g/dL — ABNORMAL HIGH (ref 12.0–15.0)
MCH: 29.1 pg (ref 26.0–34.0)
MCHC: 33.2 g/dL (ref 30.0–36.0)
MCV: 87.6 fL (ref 80.0–100.0)
Platelets: 287 10*3/uL (ref 150–400)
RBC: 5.33 MIL/uL — ABNORMAL HIGH (ref 3.87–5.11)
RDW: 13.6 % (ref 11.5–15.5)
WBC: 11.7 10*3/uL — ABNORMAL HIGH (ref 4.0–10.5)
nRBC: 0 % (ref 0.0–0.2)

## 2022-03-23 LAB — BRAIN NATRIURETIC PEPTIDE: Pro B Natriuretic peptide (BNP): 10 pg/mL (ref 0.0–100.0)

## 2022-03-23 LAB — HEPATIC FUNCTION PANEL
ALT: 14 U/L (ref 0–35)
AST: 16 U/L (ref 0–37)
Albumin: 4.5 g/dL (ref 3.5–5.2)
Alkaline Phosphatase: 61 U/L (ref 39–117)
Bilirubin, Direct: 0.1 mg/dL (ref 0.0–0.3)
Total Bilirubin: 0.5 mg/dL (ref 0.2–1.2)
Total Protein: 7.6 g/dL (ref 6.0–8.3)

## 2022-03-23 LAB — ETHANOL: Alcohol, Ethyl (B): <10 mg/dL (ref <10)

## 2022-03-23 LAB — TROPONIN I (HIGH SENSITIVITY): High Sens Troponin I: 2 ng/L (ref 2–17)

## 2022-03-23 LAB — PROTIME-INR
INR: 1 (ref 0.8–1.2)
Prothrombin Time: 12.7 seconds (ref 11.4–15.2)

## 2022-03-23 LAB — APTT: aPTT: 28 seconds (ref 24–36)

## 2022-03-23 MED ORDER — ONDANSETRON HCL 4 MG PO TABS: 4.0000 mg | ORAL_TABLET | Freq: Three times a day (TID) | ORAL | 0 refills | Status: DC | PRN

## 2022-03-23 MED ORDER — SODIUM CHLORIDE 0.9 % IV BOLUS
1000.0000 mL | Freq: Once | INTRAVENOUS | Status: AC
  Administered 2022-03-23: 1000 mL via INTRAVENOUS

## 2022-03-23 MED ORDER — DEXAMETHASONE SODIUM PHOSPHATE 10 MG/ML IJ SOLN
10.0000 mg | Freq: Once | INTRAMUSCULAR | Status: AC
  Administered 2022-03-23: 10 mg via INTRAVENOUS
  Filled 2022-03-23: qty 1

## 2022-03-23 MED ORDER — LORAZEPAM 1 MG PO TABS
1.0000 mg | ORAL_TABLET | Freq: Once | ORAL | Status: AC
  Administered 2022-03-23: 1 mg via ORAL
  Filled 2022-03-23: qty 1

## 2022-03-23 MED ORDER — MECLIZINE HCL 25 MG PO TABS
25.0000 mg | ORAL_TABLET | Freq: Once | ORAL | Status: AC
  Administered 2022-03-23: 25 mg via ORAL
  Filled 2022-03-23: qty 1

## 2022-03-23 MED ORDER — DIPHENHYDRAMINE HCL 50 MG/ML IJ SOLN
25.0000 mg | Freq: Once | INTRAMUSCULAR | Status: AC
  Administered 2022-03-23: 25 mg via INTRAVENOUS
  Filled 2022-03-23: qty 1

## 2022-03-23 MED ORDER — METOCLOPRAMIDE HCL 5 MG/ML IJ SOLN
10.0000 mg | Freq: Once | INTRAMUSCULAR | Status: AC
  Administered 2022-03-23: 10 mg via INTRAVENOUS
  Filled 2022-03-23: qty 2

## 2022-03-23 MED ORDER — IOHEXOL 350 MG/ML SOLN
75.0000 mL | Freq: Once | INTRAVENOUS | Status: AC | PRN
  Administered 2022-03-23: 75 mL via INTRAVENOUS

## 2022-03-23 NOTE — ED Notes (Signed)
Labs drawn at [facility] during visit today.

## 2022-03-23 NOTE — ED Triage Notes (Addendum)
Sent from PCP for stroke like symptoms. She reports dizziness over 6 days. MD discussing vertigo. Hx migraines but denies headache. Negative for stroke per EMS but leaning to right. Hx of COPD but not on oxygen. Not orthostatic per EMS.  Her father took her to MD today. She endorses that sensation gets worse with her head. CBG 109. VSS

## 2022-03-23 NOTE — ED Notes (Signed)
Patient was able to ambulate in the hall. She did report slight dizziness, but it was much better than before.

## 2022-03-23 NOTE — ED Provider Notes (Signed)
[facility] Provider Note   CSN: [id number] Arrival date & time: 03/23/22  1225     History  No chief complaint on file.   Jill Glenn is a 57 y.o. female.  She reports for the past 6 days she been having a feeling of dizziness described as vertigo.  Been feeling very unsteady on her feet and states she is falling to her right side when she tries to ambulate.  PCP advised evaluation in the ED today to rule out CVA.  She reports intermittent numbness and weakness to left side but denies any now.  Symptoms been ongoing for 6 days.  Denies vomiting fevers or chills.  No other complaints.  HPI     Home Medications Prior to Admission medications   Medication Sig Start Date End Date Taking? Authorizing Provider  acetaminophen (TYLENOL) 325 MG tablet Take 2 tablets (650 mg total) by mouth every 6 (six) hours as needed for moderate pain, mild pain or headache. 11/03/20   Alcus Dad, MD  albuterol (VENTOLIN HFA) 108 (90 Base) MCG/ACT inhaler Inhale 2 puffs into the lungs every 6 (six) hours as needed for wheezing or shortness of breath. 11/04/21   Cobb, Karie Schwalbe, NP  Budeson-Glycopyrrol-Formoterol (BREZTRI AEROSPHERE) 160-9-4.8 MCG/ACT AERO Inhale 2 puffs into the lungs in the morning and at bedtime. 10/14/21   Spero Geralds, MD  Cranberry 500 MG TABS Take 1 tablet by mouth daily as needed (uti prevention).    [provider]  Multiple Vitamin (MULTIVITAMIN ADULT) TABS Take 1 tablet by mouth daily.    [provider]  varenicline (CHANTIX CONTINUING MONTH PAK) 1 MG tablet Take 1 tablet (1 mg total) by mouth 2 (two) times daily. 10/14/21   Spero Geralds, MD  vitamin C (ASCORBIC ACID) 500 MG tablet Take 500 mg by mouth daily.    [provider]      Allergies    Codeine    Review of Systems   Review of Systems  Physical Exam Updated Vital Signs BP 126/87 (BP Location: Left Arm)   Pulse 71   Temp  97.8 F (36.6 C) (Oral)   Resp 14   Ht 5\' 3"  (1.6 m)   Wt 73.9 kg   SpO2 95%   BMI 28.87 kg/m  Physical Exam Vitals and nursing note reviewed.  Constitutional:      General: She is not in acute distress.    Appearance: She is well-developed.  HENT:     Head: Normocephalic and atraumatic.  Eyes:     General: No visual field deficit.    Conjunctiva/sclera: Conjunctivae normal.  Cardiovascular:     Rate and Rhythm: Normal rate and regular rhythm.     Heart sounds: No murmur heard. Pulmonary:     Effort: Pulmonary effort is normal. No respiratory distress.     Breath sounds: Normal breath sounds.  Abdominal:     Palpations: Abdomen is soft.     Tenderness: There is no abdominal tenderness.  Musculoskeletal:        General: No swelling. Normal range of motion.     Cervical back: Neck supple.  Skin:    General: Skin is warm and dry.     Capillary Refill: Capillary refill takes less than 2 seconds.  Neurological:     Mental Status: She is alert and oriented to person, place, and time.     GCS: GCS eye subscore is 4. GCS verbal subscore is  5. GCS motor subscore is 6.     Cranial Nerves: No dysarthria or facial asymmetry.     Sensory: No sensory deficit.     Motor: No weakness or atrophy.     Coordination: Finger-Nose-Finger Test and Heel to [employer] normal.  Psychiatric:        Mood and Affect: Mood normal.     ED Results / Procedures / Treatments   Labs (all labs ordered are listed, but only abnormal results are displayed) Labs Reviewed - No data to display  EKG None  Radiology DG Chest 2 View  Result Date: 03/23/2022 CLINICAL DATA:  Cough, shortness of breath, chest pain, dizziness, and nausea since 03/17/2022, history COPD EXAM: CHEST - 2 VIEW COMPARISON:  12/21/2020 FINDINGS: Normal heart size, mediastinal contours, and pulmonary vascularity. Emphysematous and bronchitic changes consistent with COPD. Minimal biapical scarring. No acute infiltrate, pleural  effusion, or pneumothorax. Osseous structures unremarkable. IMPRESSION: COPD changes with minimal biapical scarring. No acute abnormalities. Emphysema (ICD10-J43.9). Electronically Signed   By: Lavonia Dana M.D.   On: 03/23/2022 11:12    Procedures Procedures    Medications Ordered in ED Medications - No data to display  ED Course/ Medical Decision Making/ A&P                             Medical Decision Making Differential diagnosis: CVA, BPPV, labyrinthitis, migraine, other  Course: Patient is having 6 days of vertigo, feeling off balance.  Concern for possible CVA given patient's age.  CTA head and neck and MRI pending as well as labs.  Nonfocal neurologic exam as it is seated but patient is having intermittent numbness and weakness to the left side subjectively and her father is at bedside stating she is very off balance when she walks.  Labs and imaging are still pending, signed out to oncoming team  Amount and/or Complexity of Data Reviewed Labs: ordered. Radiology: ordered.  Risk Prescription drug management.           Final Clinical Impression(s) / ED Diagnoses Final diagnoses:  None    Rx / DC Orders ED Discharge Orders     None         Gwenevere Abbot, PA-C 03/23/22 1514    Jeanell Sparrow, DO 03/23/22 1537

## 2022-03-23 NOTE — Discharge Instructions (Signed)
   CTA Head and neck showed abnormality, not emergent but will need follow up imaging. See pcp. 4. In the proximal right CCA, there is a possible filling defect,  which could represent a small dissection flap, although this is more  likely artifact. Consider short interval follow-up.

## 2022-03-23 NOTE — Progress Notes (Signed)
Subjective:  Patient ID: Jill Glenn, female    DOB: Nov 09, 1965  Age: 57 y.o. MRN: [id number]  CC: Cough   HPI Jill Glenn presents for f/up ---  She complains of a 5-day history of acute onset of vertigo sensation.  She tells me that when she lays down in the bed and turns her head from side-to-side she feels nauseous, dizzy, and vertiginous.    Outpatient Medications Prior to Visit  Medication Sig Dispense Refill   acetaminophen (TYLENOL) 325 MG tablet Take 2 tablets (650 mg total) by mouth every 6 (six) hours as needed for moderate pain, mild pain or headache.     albuterol (VENTOLIN HFA) 108 (90 Base) MCG/ACT inhaler Inhale 2 puffs into the lungs every 6 (six) hours as needed for wheezing or shortness of breath. 18 g 3   Budeson-Glycopyrrol-Formoterol (BREZTRI AEROSPHERE) 160-9-4.8 MCG/ACT AERO Inhale 2 puffs into the lungs in the morning and at bedtime. 1 each 5   Cranberry 500 MG TABS Take 1 tablet by mouth daily as needed (uti prevention).     Multiple Vitamin (MULTIVITAMIN ADULT) TABS Take 1 tablet by mouth daily.     varenicline (CHANTIX CONTINUING MONTH PAK) 1 MG tablet Take 1 tablet (1 mg total) by mouth 2 (two) times daily. 60 tablet 5   vitamin C (ASCORBIC ACID) 500 MG tablet Take 500 mg by mouth daily.     No facility-administered medications prior to visit.    ROS Review of Systems  Constitutional: Negative.  Negative for chills, diaphoresis, fatigue and fever.  HENT: Negative.    Eyes:  Negative for visual disturbance.  Respiratory:  Positive for cough and shortness of breath. Negative for chest tightness.   Cardiovascular:  Positive for chest pain. Negative for palpitations and leg swelling.  Gastrointestinal:  Positive for nausea. Negative for abdominal pain, blood in stool, diarrhea and vomiting.  Genitourinary: Negative.   Musculoskeletal: Negative.   Skin: Negative.   Neurological:  Positive for dizziness, weakness and light-headedness.  Negative for numbness.  Psychiatric/Behavioral: Negative.      Objective:  BP 118/78 (BP Location: Left Arm, Patient Position: Sitting, Cuff Size: Large)   Pulse 72   Temp 97.6 F (36.4 C) (Oral)   Ht 5\' 3"  (1.6 m)   SpO2 98%   BMI 28.86 kg/m   BP Readings from Last 3 Encounters:  03/23/22 118/78  02/02/22 128/74  11/22/21 104/70    Wt Readings from Last 3 Encounters:  03/01/22 162 lb 14.7 oz (73.9 kg)  02/15/22 163 lb 12.8 oz (74.3 kg)  02/02/22 160 lb 9.6 oz (72.8 kg)    Physical Exam Constitutional:      General: She is not in acute distress.    Appearance: She is ill-appearing (in a wheelchair). She is not toxic-appearing or diaphoretic.  HENT:     Nose: Nose normal.     Mouth/Throat:     Mouth: Mucous membranes are moist.  Eyes:     General: No scleral icterus.    Conjunctiva/sclera: Conjunctivae normal.  Cardiovascular:     Rate and Rhythm: Normal rate and regular rhythm.     Heart sounds: Heart sounds are distant. No murmur heard.    No friction rub. No gallop.     Comments: EKG- NSR, 68 bpm No LVH, Q waves, or ST/T waves changes Normal EKG Pulmonary:     Effort: Pulmonary effort is normal.     Breath sounds: No stridor. No wheezing, rhonchi or rales.  Abdominal:     General: Abdomen is flat.     Palpations: There is no mass.     Tenderness: There is no abdominal tenderness. There is no guarding.     Hernia: No hernia is present.  Musculoskeletal:        General: Normal range of motion.     Cervical back: Neck supple.     Right lower leg: No edema.     Left lower leg: No edema.  Lymphadenopathy:     Cervical: No cervical adenopathy.  Skin:    General: Skin is warm and dry.     Findings: No rash.  Neurological:     Mental Status: She is alert and oriented to person, place, and time.     Cranial Nerves: Cranial nerves 2-12 are intact.     Motor: Weakness present.     Coordination: Romberg sign positive. Coordination abnormal.     Gait: Gait  abnormal.     Deep Tendon Reflexes: Reflexes normal.     Reflex Scores:      Tricep reflexes are 2+ on the right side and 2+ on the left side.      Bicep reflexes are 2+ on the right side and 2+ on the left side.      Brachioradialis reflexes are 1+ on the right side and 1+ on the left side.      Patellar reflexes are 2+ on the right side and 2+ on the left side.      Achilles reflexes are 1+ on the right side and 1+ on the left side.    Lab Results  Component Value Date   WBC 10.3 03/23/2022   HGB 14.9 03/23/2022   HCT 44.6 03/23/2022   PLT 384.0 03/23/2022   GLUCOSE 92 03/23/2022   CHOL 177 12/21/2020   TRIG 59.0 12/21/2020   HDL 74.10 12/21/2020   LDLCALC 91 12/21/2020   ALT 14 03/23/2022   AST 16 03/23/2022   NA 137 03/23/2022   K 3.9 03/23/2022   CL 103 03/23/2022   CREATININE 0.71 03/23/2022   BUN 18 03/23/2022   CO2 25 03/23/2022    DG Chest 2 View  Result Date: 03/23/2022 CLINICAL DATA:  Cough, shortness of breath, chest pain, dizziness, and nausea since 03/17/2022, history COPD EXAM: CHEST - 2 VIEW COMPARISON:  12/21/2020 FINDINGS: Normal heart size, mediastinal contours, and pulmonary vascularity. Emphysematous and bronchitic changes consistent with COPD. Minimal biapical scarring. No acute infiltrate, pleural effusion, or pneumothorax. Osseous structures unremarkable. IMPRESSION: COPD changes with minimal biapical scarring. No acute abnormalities. Emphysema (ICD10-J43.9). Electronically Signed   By: Lavonia Dana M.D.   On: 03/23/2022 11:12     Assessment & Plan:  Chest pain, unspecified type- Chest x-ray, EKG, and labs are reassuring. -     EKG 12-Lead  Acute cough- Chest x-ray is negative for mass or infiltrate. -     DG Chest 2 View; Future  Chest pain at rest -     D-dimer, quantitative; Future -     Brain natriuretic peptide; Future -     Troponin I (High Sensitivity); Future -     Hepatic function panel; Future -     DG Chest 2 View;  Future  Dizziness -     CBC with Differential/Platelet; Future -     Basic metabolic panel; Future  Mixed simple and mucopurulent chronic bronchitis (HCC) -     CBC with Differential/Platelet; Future -     Hepatic function  panel; Future  Panlobular emphysema (HCC) -     CBC with Differential/Platelet; Future -     Hepatic function panel; Future  Colon cancer screening -     Ambulatory referral to Gastroenterology  Ataxia- She has strokelike symptoms and her examination is abnormal so she is being transferred to the ED via EMS.     Follow-up: No follow-ups on file.  Scarlette Calico, MD

## 2022-03-23 NOTE — ED Notes (Signed)
Patient transported to MRI 

## 2022-03-23 NOTE — ED Provider Notes (Signed)
Patient care assumed from [facility], [place] at shift handoff.  Please see her note for full detail  In short, 57 year old female patient presents the emergency department complaining of dizziness described as vertigo.  Patient reports feeling unsteady on her feet, falling to her right side when she tries to ambulate.  Her primary care provider advised that she should be evaluated in the emergency department today to rule out CVA.  She reports intermittent numbness and weakness to the left side of her body but denies the same at this time.  Symptoms been ongoing for roughly 6 days.  Patient denies vomiting, fevers, chills.  Patient denies other complaints.  Lab workup prior to my assumption of care grossly unremarkable.  Pertinent results included WBC 11.7, UA with nitrite positive, rare bacteria. Physical Exam  BP 107/75 (BP Location: Left Arm)   Pulse 78   Temp 98.1 F (36.7 C) (Oral)   Resp 17   Ht 5\' 3"  (1.6 m)   Wt 73.9 kg   SpO2 95%   BMI 28.87 kg/m   Physical Exam  Procedures  Procedures  ED Course / MDM    Medical Decision Making Amount and/or Complexity of Data Reviewed Labs: ordered. Radiology: ordered.  Risk Prescription drug management.   I interpreted imaging including CT angio head and neck with and without contrast, MR brain without contrast. 1. No acute intracranial process.  2. No intracranial large vessel occlusion or significant stenosis.  3. No hemodynamically significant stenosis in the neck.  4. In the proximal right CCA, there is a possible filling defect,  which could represent a small dissection flap, although this is more  likely artifact. Consider short interval follow-up.  5. Severe emphysema.  Normal brain MRI. No acute intracranial abnormality identified.  I agree with the radiologist findings  The patient continues to feel dizziness and off-balance with attempts at standing.  Patient ordered normal saline bolus along with  meclizine.  Patient care being transferred to [facility], PA-C at shift handoff.  Disposition pending reassessment after fluid and meclizine administration.  Patient continues to be significantly off balance, fall risk, patient will need likely admission for PT OT eval.  If patient symptoms improve can likely discharge home.  No signs of CVA or intracranial abnormality on imaging.  Symptoms seem consistent at this time with positional vertigo.     Ronny Bacon 03/23/22 2155    Audley Hose, MD 03/24/22 [phone]

## 2022-03-23 NOTE — Progress Notes (Signed)
Attempted to get patient from ED for MRI scan but patient unable to remove hood piercing. MRI cannot be done with this piercing in place.

## 2022-03-23 NOTE — ED Provider Notes (Signed)
Patient was a Art therapist of the signout.  To summarize this is a 57 year old female with history of COPD presenting today due to 6 days of ataxia and dizziness.  No history of the same.  Described as the room is spinning, happens when sitting upright and ambulating.  Is worse throughout the day, she has been ambulating with the assistance of a cane which is new.  Denies any headache, vision changes, fevers.  Seen by primary this morning due to gait abnormality where she was leaning to the right and unable to keep her balance, sent to ED for TIA CVA workup.  No history of TIA or CVA.  No recent changes in medication, symptoms are only at rest.  No tinnitus, hearing deficits, barotrauma.  Previous team workup included CTA, MRI which were unremarkable.  No evidence of TIA, CVA.  Medications per chart review and no new medication or changes to explain new ataxia.  Patient care was assumed pending liter fluid bolus, meclizine, ambulation trial.  If patient is able to ambulate she he was sent home with close follow-up.  If unable to ambulate without assistance plan is for PT OT evaluation in the morning.  Physical Exam  BP 110/81   Pulse 86   Temp 98.1 F (36.7 C)   Resp 20   Ht 5\' 3"  (1.6 m)   Wt 73.9 kg   SpO2 95%   BMI 28.87 kg/m    Procedures  Procedures  ED Course / MDM   Clinical Course as of 03/24/22 0034  Wed Mar 23, 2022  2259 I consulted with neurology given patient's persistent ataxia and gait abnormality, spoke with Dr. Leonel Ramsay who agrees abnormal presentation he will come evaluate the patient. [HS]    Clinical Course User Index [HS] Sherrill Raring, PA-C   Medical Decision Making Amount and/or Complexity of Data Reviewed Labs: ordered. Radiology: ordered.  Risk Prescription drug management. Decision regarding hospitalization.   Consulted neurology who evaluated the patient.  Concern for debilitating BPPV, also a hyperreflexive.  MRI of cervical spine ordered.  Per neurology  recommendations will admit to hospitalist service, patient will need PT and OT and additional management.  I consulted the hospitalist service who agrees with admission.      Sherrill Raring, PA-C 03/24/22 0034    Fatima Blank, MD 03/24/22 [phone]

## 2022-03-24 ENCOUNTER — Observation Stay (HOSPITAL_COMMUNITY): Payer: 59

## 2022-03-24 ENCOUNTER — Other Ambulatory Visit: Payer: Self-pay

## 2022-03-24 ENCOUNTER — Inpatient Hospital Stay (HOSPITAL_COMMUNITY): Payer: 59

## 2022-03-24 DIAGNOSIS — H8113 Benign paroxysmal vertigo, bilateral: Secondary | ICD-10-CM

## 2022-03-24 DIAGNOSIS — F17200 Nicotine dependence, unspecified, uncomplicated: Secondary | ICD-10-CM

## 2022-03-24 DIAGNOSIS — J431 Panlobular emphysema: Secondary | ICD-10-CM

## 2022-03-24 DIAGNOSIS — H8110 Benign paroxysmal vertigo, unspecified ear: Secondary | ICD-10-CM | POA: Diagnosis not present

## 2022-03-24 DIAGNOSIS — R42 Dizziness and giddiness: Secondary | ICD-10-CM

## 2022-03-24 LAB — D-DIMER, QUANTITATIVE: D-Dimer, Quant: <0.27 ug/mL-FEU (ref 0.00–0.50)

## 2022-03-24 LAB — GLUCOSE, CAPILLARY
Glucose-Capillary: 106 mg/dL — ABNORMAL HIGH (ref 70–99)
Glucose-Capillary: 143 mg/dL — ABNORMAL HIGH (ref 70–99)
Glucose-Capillary: 105 mg/dL — ABNORMAL HIGH (ref 70–99)
Glucose-Capillary: 122 mg/dL — ABNORMAL HIGH (ref 70–99)

## 2022-03-24 LAB — BASIC METABOLIC PANEL
Anion gap: 9 (ref 5–15)
BUN: 18 mg/dL (ref 6–20)
CO2: 21 mmol/L — ABNORMAL LOW (ref 22–32)
Calcium: 9 mg/dL (ref 8.9–10.3)
Chloride: 106 mmol/L (ref 98–111)
Creatinine, Ser: 0.64 mg/dL (ref 0.44–1.00)
GFR, Estimated: >60 mL/min (ref >60)
Glucose, Bld: 142 mg/dL — ABNORMAL HIGH (ref 70–99)
Potassium: 4 mmol/L (ref 3.5–5.1)
Sodium: 136 mmol/L (ref 135–145)

## 2022-03-24 LAB — TROPONIN I (HIGH SENSITIVITY): Troponin I (High Sensitivity): 3 ng/L (ref <18)

## 2022-03-24 LAB — VITAMIN B12: Vitamin B-12: 404 pg/mL (ref 180–914)

## 2022-03-24 LAB — CBC
HCT: 40.9 % (ref 36.0–46.0)
Hemoglobin: 13.4 g/dL (ref 12.0–15.0)
MCH: 29 pg (ref 26.0–34.0)
MCHC: 32.8 g/dL (ref 30.0–36.0)
MCV: 88.5 fL (ref 80.0–100.0)
Platelets: 362 10*3/uL (ref 150–400)
RBC: 4.62 MIL/uL (ref 3.87–5.11)
RDW: 13.7 % (ref 11.5–15.5)
WBC: 12.6 10*3/uL — ABNORMAL HIGH (ref 4.0–10.5)
nRBC: 0 % (ref 0.0–0.2)

## 2022-03-24 LAB — MAGNESIUM: Magnesium: 2.2 mg/dL (ref 1.7–2.4)

## 2022-03-24 LAB — HIV ANTIBODY (ROUTINE TESTING W REFLEX): HIV Screen 4th Generation wRfx: NONREACTIVE

## 2022-03-24 LAB — BRAIN NATRIURETIC PEPTIDE: B Natriuretic Peptide: 16.7 pg/mL (ref 0.0–100.0)

## 2022-03-24 MED ORDER — ONDANSETRON HCL 4 MG PO TABS: 4.0000 mg | ORAL_TABLET | Freq: Four times a day (QID) | ORAL | Status: DC | PRN

## 2022-03-24 MED ORDER — IPRATROPIUM-ALBUTEROL 0.5-2.5 (3) MG/3ML IN SOLN
3.0000 mL | RESPIRATORY_TRACT | Status: DC | PRN
  Administered 2022-03-24: 3 mL via RESPIRATORY_TRACT
  Filled 2022-03-24: qty 3

## 2022-03-24 MED ORDER — ACETAMINOPHEN 325 MG PO TABS
650.0000 mg | ORAL_TABLET | Freq: Four times a day (QID) | ORAL | Status: DC | PRN
  Administered 2022-03-24 – 2022-03-25 (×2): 650 mg via ORAL
  Filled 2022-03-24 (×2): qty 2

## 2022-03-24 MED ORDER — SODIUM CHLORIDE 0.9 % IV SOLN: INTRAVENOUS | Status: AC

## 2022-03-24 MED ORDER — INSULIN ASPART 100 UNIT/ML IJ SOLN: 0.0000 [IU] | Freq: Every day | INTRAMUSCULAR | Status: DC

## 2022-03-24 MED ORDER — MECLIZINE HCL 25 MG PO TABS
25.0000 mg | ORAL_TABLET | Freq: Two times a day (BID) | ORAL | Status: DC
  Administered 2022-03-24 – 2022-03-26 (×5): 25 mg via ORAL
  Filled 2022-03-24 (×5): qty 1

## 2022-03-24 MED ORDER — IPRATROPIUM-ALBUTEROL 0.5-2.5 (3) MG/3ML IN SOLN
3.0000 mL | Freq: Three times a day (TID) | RESPIRATORY_TRACT | Status: DC
  Administered 2022-03-24 – 2022-03-26 (×7): 3 mL via RESPIRATORY_TRACT
  Filled 2022-03-24 (×7): qty 3

## 2022-03-24 MED ORDER — MECLIZINE HCL 25 MG PO TABS
25.0000 mg | ORAL_TABLET | Freq: Three times a day (TID) | ORAL | Status: DC
  Administered 2022-03-24: 25 mg via ORAL
  Filled 2022-03-24: qty 1

## 2022-03-24 MED ORDER — NICOTINE 21 MG/24HR TD PT24
21.0000 mg | MEDICATED_PATCH | Freq: Every day | TRANSDERMAL | Status: DC
  Administered 2022-03-24 – 2022-03-26 (×3): 21 mg via TRANSDERMAL
  Filled 2022-03-24 (×3): qty 1

## 2022-03-24 MED ORDER — SENNOSIDES-DOCUSATE SODIUM 8.6-50 MG PO TABS: 1.0000 | ORAL_TABLET | Freq: Every evening | ORAL | Status: DC | PRN

## 2022-03-24 MED ORDER — DIPHENHYDRAMINE HCL 25 MG PO CAPS
50.0000 mg | ORAL_CAPSULE | Freq: Every evening | ORAL | Status: DC | PRN
  Administered 2022-03-24 – 2022-03-26 (×2): 50 mg via ORAL
  Filled 2022-03-24 (×2): qty 2

## 2022-03-24 MED ORDER — ZOLPIDEM TARTRATE 5 MG PO TABS
5.0000 mg | ORAL_TABLET | Freq: Once | ORAL | Status: AC
  Administered 2022-03-24: 5 mg via ORAL
  Filled 2022-03-24: qty 1

## 2022-03-24 MED ORDER — ACETAMINOPHEN 650 MG RE SUPP: 650.0000 mg | Freq: Four times a day (QID) | RECTAL | Status: DC | PRN

## 2022-03-24 MED ORDER — INSULIN ASPART 100 UNIT/ML IJ SOLN
0.0000 [IU] | Freq: Three times a day (TID) | INTRAMUSCULAR | Status: DC
  Administered 2022-03-24: 2 [IU] via SUBCUTANEOUS
  Administered 2022-03-25: 3 [IU] via SUBCUTANEOUS

## 2022-03-24 MED ORDER — BUDESON-GLYCOPYRROL-FORMOTEROL 160-9-4.8 MCG/ACT IN AERO: 2.0000 | INHALATION_SPRAY | Freq: Two times a day (BID) | RESPIRATORY_TRACT | Status: DC

## 2022-03-24 MED ORDER — UMECLIDINIUM BROMIDE 62.5 MCG/ACT IN AEPB
1.0000 | INHALATION_SPRAY | Freq: Every day | RESPIRATORY_TRACT | Status: DC
  Administered 2022-03-24 – 2022-03-25 (×2): 1 via RESPIRATORY_TRACT
  Filled 2022-03-24: qty 7

## 2022-03-24 MED ORDER — ENOXAPARIN SODIUM 40 MG/0.4ML IJ SOSY: 40.0000 mg | PREFILLED_SYRINGE | INTRAMUSCULAR | Status: DC

## 2022-03-24 MED ORDER — ONDANSETRON HCL 4 MG/2ML IJ SOLN
4.0000 mg | Freq: Four times a day (QID) | INTRAMUSCULAR | Status: DC | PRN
  Administered 2022-03-25: 4 mg via INTRAVENOUS
  Filled 2022-03-24: qty 2

## 2022-03-24 MED ORDER — ENOXAPARIN SODIUM 40 MG/0.4ML IJ SOSY
40.0000 mg | PREFILLED_SYRINGE | Freq: Every day | INTRAMUSCULAR | Status: DC
  Administered 2022-03-24 – 2022-03-25 (×2): 40 mg via SUBCUTANEOUS
  Filled 2022-03-24 (×2): qty 0.4

## 2022-03-24 MED ORDER — FLUTICASONE FUROATE-VILANTEROL 200-25 MCG/ACT IN AEPB
1.0000 | INHALATION_SPRAY | Freq: Every day | RESPIRATORY_TRACT | Status: DC
  Administered 2022-03-24 – 2022-03-25 (×2): 1 via RESPIRATORY_TRACT
  Filled 2022-03-24: qty 28

## 2022-03-24 NOTE — Plan of Care (Signed)
MRI of the C-spine completed.  It shows multilevel cervical spondylosis, worst at C5-6 where there is mild spinal canal stenosis and bilateral neural foraminal narrowing.  Severe left neural foraminal narrowing at C6-7.  Moderate left foraminal narrowing at C2-3. Needs outpatient neurosurgical follow-up for DJD. Recommendations as in Dr. Cecil Cobbs note from this morning. Plan relayed to Dr. Tana Coast Please call neurology with questions as needed  -- Amie Portland, MD Neurologist [facility] Pager: [phone]

## 2022-03-24 NOTE — H&P (Addendum)
PCP:   Janith Lima, MD   Chief Complaint:  Dizziness  HPI: This is a 57 year old female with past medical history of COPD and ongoing tobacco use.  For approximately a week, since last Thursday she has persistent dizziness.  Her gait is unsteady and she is unable to walk straight.  She has been leaning to the right.  She denies any other neurological symptoms including localized weakness, headaches, nausea or vomiting.  She denies being ill with any fevers or chills, cough or shortness of breath or wheezing.  Patient states that the room is spinning around her, she denies tinnitus or recurrent ear infections.  She denies new medications or change in medications.  Patient does report insomnia which is new over the past 2 weeks.  She states she is unable to sleep for more than 2 hours at a time.  On a few occasions, she has unsuccessfully tried THC Gummies in effort to relax and sleep.  TIA/CVA workup was initiated in the ER.  CT head/CTA head and neck/MRI head have been unrevealing.  Patient has received meclizine, IV Decadron, IV Ativan, IV Benadryl, and IV Reglan without improvement of symptoms.  Per EDP neurology consult placed.  Per report there is concern for BPPV.  Patient still with inability to ambulate safely.  Admission requested  Review of Systems:  Negatives: The patient denies anorexia, fever, weight loss, vision loss, decreased hearing, hoarseness, chest pain, syncope, dyspnea on exertion, peripheral edema, hemoptysis, abdominal pain, melena, hematochezia, severe indigestion/heartburn, hematuria, incontinence, genital sores, muscle weakness, suspicious skin lesions, transient blindness, depression, unusual weight change, abnormal bleeding, enlarged lymph nodes, angioedema, and breast masses. Positives: Dizziness, balance deficits, difficulty walking  Past Medical History: Past Medical History:  Diagnosis Date   Asthma    Past Surgical History:  Procedure Laterality Date    ABDOMINAL HYSTERECTOMY     BREAST EXCISIONAL BIOPSY     TONSILLECTOMY      Medications: Prior to Admission medications   Medication Sig Start Date End Date Taking? Authorizing Provider  acetaminophen (TYLENOL) 325 MG tablet Take 2 tablets (650 mg total) by mouth every 6 (six) hours as needed for moderate pain, mild pain or headache. 11/03/20   Alcus Dad, MD  albuterol (VENTOLIN HFA) 108 (90 Base) MCG/ACT inhaler Inhale 2 puffs into the lungs every 6 (six) hours as needed for wheezing or shortness of breath. 11/04/21   Cobb, Karie Schwalbe, NP  Budeson-Glycopyrrol-Formoterol (BREZTRI AEROSPHERE) 160-9-4.8 MCG/ACT AERO Inhale 2 puffs into the lungs in the morning and at bedtime. 10/14/21   Spero Geralds, MD  Cranberry 500 MG TABS Take 1 tablet by mouth daily as needed (uti prevention).    [provider]  Multiple Vitamin (MULTIVITAMIN ADULT) TABS Take 1 tablet by mouth daily.    [provider]  varenicline (CHANTIX CONTINUING MONTH PAK) 1 MG tablet Take 1 tablet (1 mg total) by mouth 2 (two) times daily. 10/14/21   Spero Geralds, MD  vitamin C (ASCORBIC ACID) 500 MG tablet Take 500 mg by mouth daily.    [provider]    Allergies:   Allergies  Allergen Reactions   Codeine     Social History:  reports that she quit smoking about 5 months ago. Her smoking use included cigarettes. She started smoking about 41 years ago. She has a 40.00 pack-year smoking history. She has never used smokeless tobacco. She reports that she does not currently use alcohol. She reports that she does not use  drugs.  Family History: Family History  Problem Relation Age of Onset   COPD Mother    Lung cancer Mother     Physical Exam: Vitals:   03/23/22 2000 03/23/22 2225 03/23/22 2355 03/24/22 0000  BP: 98/83 113/64 110/81   Pulse: 77 88 86   Resp: 18 18 20    Temp:    98.1 F (36.7 C)  TempSrc:      SpO2: 95% 91% 95%   Weight:      Height:        General:  Alert  and oriented times three, well developed and nourished, no acute distress Eyes: PERRLA, pink conjunctiva, no scleral icterus ENT: Moist oral mucosa, neck supple, no thyromegaly Lungs: clear to ascultation, no wheeze, no crackles, no use of accessory muscles Cardiovascular: regular rate and rhythm, no regurgitation, no gallops, no murmurs. No carotid bruits, no JVD Abdomen: soft, positive BS, non-tender, non-distended, no organomegaly, not an acute abdomen GU: not examined Neuro: CN II - XII grossly intact, sensation intact.  Markedly positive Dix-Hallpike maneuver, associated with nystagmus and hand tremors Musculoskeletal: strength 5/5 all extremities, no clubbing, cyanosis or edema Skin: no rash, no subcutaneous crepitation, no decubitus Psych: appropriate patient   Labs on Admission:  Recent Labs    03/23/22 1047 03/23/22 2059  NA 137 134*  K 3.9 3.7  CL 103 101  CO2 25 22  GLUCOSE 92 159*  BUN 18 18  CREATININE 0.71 0.77  CALCIUM 10.0 9.2   Recent Labs    03/23/22 1047 03/23/22 2059  AST 16 24  ALT 14 21  ALKPHOS 61 54  BILITOT 0.5 0.7  PROT 7.6 7.7  ALBUMIN 4.5 4.7    Recent Labs    03/23/22 1047 03/23/22 1341  WBC 10.3 11.7*  NEUTROABS 6.8 7.4  HGB 14.9 15.5*  HCT 44.6 46.7*  MCV 87.8 87.6  PLT 384.0 287    Recent Labs    03/23/22 1047  DDIMER <0.19    Radiological Exams on Admission: MR BRAIN WO CONTRAST  Result Date: 03/23/2022 CLINICAL DATA:  Initial evaluation for vertigo. EXAM: MRI HEAD WITHOUT CONTRAST TECHNIQUE: Multiplanar, multiecho pulse sequences of the brain and surrounding structures were obtained without intravenous contrast. COMPARISON:  Prior CT from earlier the same day. FINDINGS: Brain: Cerebral volume within normal limits for age. No focal parenchymal signal abnormality. No abnormal foci of restricted diffusion to suggest acute or subacute ischemia. Gray-white matter differentiation well maintained. No encephalomalacia to suggest  chronic cortical infarction or other insult. No foci of susceptibility artifact indicative of acute or chronic intracranial blood products. No mass lesion, midline shift or mass effect. Ventricles normal in size and morphology without hydrocephalus. No extra-axial fluid collection. Pituitary gland and suprasellar region within normal limits. Vascular: Major intracranial vascular flow voids are well maintained. Skull and upper cervical spine: Craniocervical junction within normal limits. Visualized upper cervical spine demonstrates no significant finding. Bone marrow signal intensity within normal limits. No scalp soft tissue abnormality. Sinuses/Orbits: Globes and orbital soft tissues are within normal limits. Paranasal sinuses are largely clear. No significant mastoid effusion. Other: None. IMPRESSION: Normal brain MRI. No acute intracranial abnormality identified. Electronically Signed   By: Jeannine Boga M.D.   On: 03/23/2022 20:40   CT Angio Head W or Wo Contrast  Result Date: 03/23/2022 CLINICAL DATA:  Dizziness, history of migraine EXAM: CT ANGIOGRAPHY HEAD AND NECK TECHNIQUE: Multidetector CT imaging of the head and neck was performed using the standard protocol during bolus  administration of intravenous contrast. Multiplanar CT image reconstructions and MIPs were obtained to evaluate the vascular anatomy. Carotid stenosis measurements (when applicable) are obtained utilizing NASCET criteria, using the distal internal carotid diameter as the denominator. RADIATION DOSE REDUCTION: This exam was performed according to the departmental dose-optimization program which includes automated exposure control, adjustment of the mA and/or kV according to patient size and/or use of iterative reconstruction technique. CONTRAST:  38mL OMNIPAQUE IOHEXOL 350 MG/ML SOLN COMPARISON:  None Available. FINDINGS: CT HEAD FINDINGS Brain: No evidence of acute infarct, hemorrhage, mass, mass effect, or midline shift. No  hydrocephalus or extra-axial fluid collection. Vascular: No hyperdense vessel. Skull: Negative for fracture or focal lesion. Sinuses/Orbits: No acute finding. Other: None. CTA NECK FINDINGS Aortic arch: Standard branching. Imaged portion shows no evidence of aneurysm or dissection. No significant stenosis of the major arch vessel origins. Right carotid system: Possible small filling defect in the proximal right CCA (series 11, image 275), although this may be artifactual. No hemodynamically significant stenosis or occlusion. Left carotid system: No evidence of stenosis, dissection, or occlusion. Vertebral arteries: No evidence of stenosis, dissection, or occlusion. Skeleton: No acute osseous abnormality. Degenerative changes in the cervical spine. Other neck: 7 mm lesion with calcification in the right thyroid lobe, for which no follow-up is currently indicated. (Reference: J Am Coll Radiol. 2015 Feb;12(2): 143-50). Upper chest: Severe centrilobular and paraseptal emphysema. No focal pulmonary opacity or pleural effusion. Review of the MIP images confirms the above findings CTA HEAD FINDINGS Anterior circulation: Both internal carotid arteries are patent to the termini, without significant stenosis. A1 segments patent. Normal anterior communicating artery. Anterior cerebral arteries are patent to their distal aspects. No M1 stenosis or occlusion. MCA branches perfused and symmetric. Posterior circulation: Vertebral arteries patent to the vertebrobasilar junction without stenosis. Posterior inferior cerebellar arteries patent proximally. Basilar patent to its distal aspect. Superior cerebellar arteries patent proximally. Patent P1 segments. PCAs perfused to their distal aspects without stenosis. The bilateral posterior communicating arteries are not visualized. Venous sinuses: As permitted by contrast timing, patent. Anatomic variants: None significant. Review of the MIP images confirms the above findings IMPRESSION:  1. No acute intracranial process. 2. No intracranial large vessel occlusion or significant stenosis. 3. No hemodynamically significant stenosis in the neck. 4. In the proximal right CCA, there is a possible filling defect, which could represent a small dissection flap, although this is more likely artifact. Consider short interval follow-up. 5. Severe emphysema. Emphysema (ICD10-J43.9). Electronically Signed   By: Merilyn Baba M.D.   On: 03/23/2022 19:18   CT Angio Neck W and/or Wo Contrast  Result Date: 03/23/2022 CLINICAL DATA:  Dizziness, history of migraine EXAM: CT ANGIOGRAPHY HEAD AND NECK TECHNIQUE: Multidetector CT imaging of the head and neck was performed using the standard protocol during bolus administration of intravenous contrast. Multiplanar CT image reconstructions and MIPs were obtained to evaluate the vascular anatomy. Carotid stenosis measurements (when applicable) are obtained utilizing NASCET criteria, using the distal internal carotid diameter as the denominator. RADIATION DOSE REDUCTION: This exam was performed according to the departmental dose-optimization program which includes automated exposure control, adjustment of the mA and/or kV according to patient size and/or use of iterative reconstruction technique. CONTRAST:  56mL OMNIPAQUE IOHEXOL 350 MG/ML SOLN COMPARISON:  None Available. FINDINGS: CT HEAD FINDINGS Brain: No evidence of acute infarct, hemorrhage, mass, mass effect, or midline shift. No hydrocephalus or extra-axial fluid collection. Vascular: No hyperdense vessel. Skull: Negative for fracture or focal lesion. Sinuses/Orbits: No acute  finding. Other: None. CTA NECK FINDINGS Aortic arch: Standard branching. Imaged portion shows no evidence of aneurysm or dissection. No significant stenosis of the major arch vessel origins. Right carotid system: Possible small filling defect in the proximal right CCA (series 11, image 275), although this may be artifactual. No  hemodynamically significant stenosis or occlusion. Left carotid system: No evidence of stenosis, dissection, or occlusion. Vertebral arteries: No evidence of stenosis, dissection, or occlusion. Skeleton: No acute osseous abnormality. Degenerative changes in the cervical spine. Other neck: 7 mm lesion with calcification in the right thyroid lobe, for which no follow-up is currently indicated. (Reference: J Am Coll Radiol. 2015 Feb;12(2): 143-50). Upper chest: Severe centrilobular and paraseptal emphysema. No focal pulmonary opacity or pleural effusion. Review of the MIP images confirms the above findings CTA HEAD FINDINGS Anterior circulation: Both internal carotid arteries are patent to the termini, without significant stenosis. A1 segments patent. Normal anterior communicating artery. Anterior cerebral arteries are patent to their distal aspects. No M1 stenosis or occlusion. MCA branches perfused and symmetric. Posterior circulation: Vertebral arteries patent to the vertebrobasilar junction without stenosis. Posterior inferior cerebellar arteries patent proximally. Basilar patent to its distal aspect. Superior cerebellar arteries patent proximally. Patent P1 segments. PCAs perfused to their distal aspects without stenosis. The bilateral posterior communicating arteries are not visualized. Venous sinuses: As permitted by contrast timing, patent. Anatomic variants: None significant. Review of the MIP images confirms the above findings IMPRESSION: 1. No acute intracranial process. 2. No intracranial large vessel occlusion or significant stenosis. 3. No hemodynamically significant stenosis in the neck. 4. In the proximal right CCA, there is a possible filling defect, which could represent a small dissection flap, although this is more likely artifact. Consider short interval follow-up. 5. Severe emphysema. Emphysema (ICD10-J43.9). Electronically Signed   By: Merilyn Baba M.D.   On: 03/23/2022 19:18   DG Chest 2  View  Result Date: 03/23/2022 CLINICAL DATA:  Cough, shortness of breath, chest pain, dizziness, and nausea since 03/17/2022, history COPD EXAM: CHEST - 2 VIEW COMPARISON:  12/21/2020 FINDINGS: Normal heart size, mediastinal contours, and pulmonary vascularity. Emphysematous and bronchitic changes consistent with COPD. Minimal biapical scarring. No acute infiltrate, pleural effusion, or pneumothorax. Osseous structures unremarkable. IMPRESSION: COPD changes with minimal biapical scarring. No acute abnormalities. Emphysema (ICD10-J43.9). Electronically Signed   By: Lavonia Dana M.D.   On: 03/23/2022 11:12    Assessment/Plan Present on Admission:  Marked BPPV -DDx: BPPV, vertigo, CVA -PT/OT consult, Epley maneuvers in a.m. -Continue IV fluid hydration -Neuroconsult per a.m. team -CVA ruled out -Vitamin B12 level ordered -MRI C spine pending   Insomnia -Ambien ordered   ?UTI -weakly implied by urinalysis, will await culture result. No antibiotic started -WBC 11.7.  Repeat CBC in a.m.  Hyperglycemia -sliding scale and HgBA1c ordered   Tobacco use disorder -1 to 2 packs daily, nicotine patch ordered   Severe COPD -noted on CT neck -patient not on home oxygen  Ellis Koffler 03/24/2022, 12:18 AM

## 2022-03-24 NOTE — Evaluation (Addendum)
Physical Therapy Evaluation Patient Details Name: Jill Glenn MRN: [id number] DOB: May 26, 1965 Today's Date: 03/24/2022  History of Present Illness  Jill Glenn is a 57 y.o. female admitted 03/23/22 with dizziness with mobility. She was seen by her primary care who noticed some abnormal reflexes and referred her to the emergency department for evaluation for possible stroke and BPPV. CTA and MRI negative for acute abnormalities. PMH includes Asthma, COPD.  Clinical Impression  Pt admitted with above diagnosis.  Pt currently with functional limitations due to the deficits listed below (see PT Problem List). Pt will benefit from skilled PT to increase their independence and safety with mobility to allow discharge to the venue listed below.  Pt positive for rotary nystagmus with Left Dix-Hallpike (indicating pt likely has left BPPV) and treated with Epley maneuver.  Pt then able to ambulate in hallway and denies dizziness and spinning sensation.  SPO2 98-100% on room air during session and after ambulating so O2 [place] left off and RN aware.  Plan to check back on pt tomorrow and reassess.    03/24/22 1157  Vestibular Assessment  General Observation Pt reports sudden onset of spinning.  She states she does have a hx of head injury.  Symptom Behavior  Subjective history of current problem Spinning with rolling or moving left side of bed since Thursday.  Type of Dizziness  Spinning  Frequency of Dizziness episodic since Thursday  Duration of Dizziness pt reports less then 2 minutes  Symptom Nature Positional  Aggravating Factors Turning head quickly;Rolling to left  Relieving Factors Head stationary;Closing eyes;Rest  Progression of Symptoms Better  Oculomotor Exam  Oculomotor Alignment Normal  Spontaneous Absent  Gaze-induced  Absent  Head shaking Horizontal Absent  Head Shaking Vertical Absent  Smooth Pursuits Intact  Saccades Slow  Positional Testing  Dix-Hallpike Dix-Hallpike  Right;Dix-Hallpike Left  Horizontal Canal Testing Horizontal Canal Right;Horizontal Canal Left  Dix-Hallpike Right  Dix-Hallpike Right Symptoms No nystagmus  Dix-Hallpike Left  Dix-Hallpike Left Duration approx 20 sec  Dix-Hallpike Left Symptoms Upbeat, right rotatory nystagmus  Horizontal Canal Right  Horizontal Canal Right Symptoms Normal  Horizontal Canal Left  Horizontal Canal Left Symptoms Normal         Recommendations for follow up therapy are one component of a multi-disciplinary discharge planning process, led by the attending physician.  Recommendations may be updated based on patient status, additional functional criteria and insurance authorization.  Follow Up Recommendations Outpatient PT (vestibular rehab)      Assistance Recommended at Discharge PRN  Patient can return home with the following  A little help with walking and/or transfers;Help with stairs or ramp for entrance    Equipment Recommendations None recommended by PT  Recommendations for Other Services       Functional Status Assessment Patient has had a recent decline in their functional status and demonstrates the ability to make significant improvements in function in a reasonable and predictable amount of time.     Precautions / Restrictions Precautions Precautions: Fall      Mobility  Bed Mobility Overal bed mobility: Modified Independent                  Transfers Overall transfer level: Needs assistance Equipment used: None Transfers: Sit to/from Stand Sit to Stand: Min guard           General transfer comment: min/guard for safety with dizziness    Ambulation/Gait Ambulation/Gait assistance: Min guard Gait Distance (Feet): 200 Feet Assistive device: None Gait  Pattern/deviations: Ataxic, Decreased stride length, Step-through pattern Gait velocity: decr     General Gait Details: slow, mildly unsteady/ataxic gait which pt says her boyfriend reported she has been  "walking funny", denies numbness/tingling in LEs, denies Scientist, research (life sciences)    Modified Rankin (Stroke Patients Only)       Balance Overall balance assessment: Mild deficits observed, not formally tested                                           Pertinent Vitals/Pain Pain Assessment Pain Assessment: No/denies pain    Home Living Family/patient expects to be discharged to:: Private residence Living Arrangements: Spouse/significant other Available Help at Discharge: Family Type of Home: House Home Access: Stairs to enter Entrance Stairs-Rails: Right Entrance Stairs-Number of Steps: 3-4     Home Equipment: None      Prior Function Prior Level of Function : Independent/Modified Independent                     Hand Dominance        Extremity/Trunk Assessment        Lower Extremity Assessment Lower Extremity Assessment: Overall WFL for tasks assessed    Cervical / Trunk Assessment Cervical / Trunk Assessment: Normal  Communication   Communication: No difficulties  Cognition Arousal/Alertness: Awake/alert Behavior During Therapy: WFL for tasks assessed/performed Overall Cognitive Status: Within Functional Limits for tasks assessed                                          General Comments      Exercises     Assessment/Plan    PT Assessment Patient needs continued PT services  PT Problem List Decreased activity tolerance;Decreased mobility       PT Treatment Interventions Gait training;DME instruction;Therapeutic exercise;Functional mobility training;Therapeutic activities;Balance training;Neuromuscular re-education;Patient/family education (habituation exercises, CRT, compensation strategies)    PT Goals (Current goals can be found in the Care Plan section)  Acute Rehab PT Goals PT Goal Formulation: With patient Time For Goal Achievement: 03/31/22 Potential to  Achieve Goals: Good    Frequency Min 3X/week     Co-evaluation               AM-PAC PT "6 Clicks" Mobility  Outcome Measure Help needed turning from your back to your side while in a flat bed without using bedrails?: None Help needed moving from lying on your back to sitting on the side of a flat bed without using bedrails?: None Help needed moving to and from a bed to a chair (including a wheelchair)?: A Little Help needed standing up from a chair using your arms (e.g., wheelchair or bedside chair)?: A Little Help needed to walk in hospital room?: A Little Help needed climbing 3-5 steps with a railing? : A Little 6 Click Score: 20    End of Session Equipment Utilized During Treatment: Gait belt Activity Tolerance: Patient tolerated treatment well Patient left: in bed;with call bell/phone within reach;with bed alarm set Nurse Communication: Mobility status PT Visit Diagnosis: Ataxic gait (R26.0);BPPV BPPV - Right/Left : Left    Time: 1050-1120 PT Time Calculation (min) (ACUTE ONLY): 30 min   Charges:   PT  Evaluation $PT Eval Moderate Complexity: 1 Mod PT Treatments $[place] Rep Proc: 8-22 mins       Jannette Spanner PT, DPT Physical Therapist Acute Rehabilitation Services Preferred contact method: Secure Chat Weekend Pager Only: [phone] Office: [place] 03/24/2022, 12:02 PM

## 2022-03-24 NOTE — Evaluation (Signed)
Occupational Therapy Evaluation Patient Details Name: Jill Glenn MRN: [id number] DOB: January 19, 1965 Today's Date: 03/24/2022   History of Present Illness Jill Glenn [place] is a 57 y.o. female admitted 03/23/22 with dizziness with mobility. She was seen by her primary care who noticed some abnormal reflexes and referred her to the emergency department for evaluation for possible stroke and BPPV. CTA and MRI negative for acute abnormalities. PMH includes Asthma, COPD.   Clinical Impression   Pt is typically independent in ADL and mobility. Today she was pleasant, cooperative, and eager to work with therapy. She reports decreased impact of BPPV but remains unsteady on her feet with 1 LOB requiring assist to correct and HHA for ambulation in the room. She participated in standing grooming at sink utilizing the sink surface for balance, in addition to UB and LB ADL at min guard/min A. OT will follow acutely but do not anticipate in the need for post-acute OT. Next session focus on tub transfer with 3 in1  (verbally reviewed today).      Recommendations for follow up therapy are one component of a multi-disciplinary discharge planning process, led by the attending physician.  Recommendations may be updated based on patient status, additional functional criteria and insurance authorization.   Follow Up Recommendations  No OT follow up     Assistance Recommended at Discharge Intermittent Supervision/Assistance  Patient can return home with the following A little help with walking and/or transfers;A little help with bathing/dressing/bathroom;Assistance with cooking/housework;Assist for transportation;Help with stairs or ramp for entrance    Functional Status Assessment  Patient has had a recent decline in their functional status and demonstrates the ability to make significant improvements in function in a reasonable and predictable amount of time.  Equipment Recommendations  BSC/3in1     Recommendations for Other Services PT consult     Precautions / Restrictions Precautions Precautions: Fall      Mobility Bed Mobility Overal bed mobility: Modified Independent                  Transfers Overall transfer level: Needs assistance Equipment used: None Transfers: Sit to/from Stand Sit to Stand: Min guard           General transfer comment: min/guard for safety with dizziness      Balance Overall balance assessment: Mild deficits observed, not formally tested                                         ADL either performed or assessed with clinical judgement   ADL Overall ADL's : Needs assistance/impaired Eating/Feeding: Independent   Grooming: Wash/dry face;Oral care;Supervision/safety;Standing Grooming Details (indicate cue type and reason): sink level, uses sink surface for balance Upper Body Bathing: Min guard Upper Body Bathing Details (indicate cue type and reason): would be safer in sitting Lower Body Bathing: Supervison/ safety;Sitting/lateral leans   Upper Body Dressing : Supervision/safety;Sitting   Lower Body Dressing: Min guard;Sitting/lateral leans Lower Body Dressing Details (indicate cue type and reason): donning socks Toilet Transfer: Minimal assistance;Ambulation Toilet Transfer Details (indicate cue type and reason): HHA Toileting- Clothing Manipulation and Hygiene: Minimal assistance;Sit to/from stand       Functional mobility during ADLs: Minimal assistance (1 person HHA) General ADL Comments: balance deficits, BPPV     Vision Patient Visual Report: No change from baseline Vision Assessment?: No apparent visual deficits     Perception  Praxis      Pertinent Vitals/Pain Pain Assessment Pain Assessment: No/denies pain     Hand Dominance Right   Extremity/Trunk Assessment Upper Extremity Assessment Upper Extremity Assessment: Overall WFL for tasks assessed   Lower Extremity  Assessment Lower Extremity Assessment: Defer to PT evaluation   Cervical / Trunk Assessment Cervical / Trunk Assessment: Normal   Communication Communication Communication: No difficulties   Cognition Arousal/Alertness: Awake/alert Behavior During Therapy: WFL for tasks assessed/performed Overall Cognitive Status: Within Functional Limits for tasks assessed                                       General Comments       Exercises     Shoulder Instructions      Home Living Family/patient expects to be discharged to:: Private residence Living Arrangements: Spouse/significant other Available Help at Discharge: Family Type of Home: House Home Access: Stairs to enter Technical brewer of Steps: 3-4 Entrance Stairs-Rails: Right Home Layout: One level     Bathroom Shower/Tub: Teacher, early years/pre: Standard     Home Equipment: Crutches;Cane - single point   Additional Comments: has 7 cats and loves to feed woodland creatures      Prior Functioning/Environment Prior Level of Function : Independent/Modified Independent             Mobility Comments: no DME typically ADLs Comments: independent        OT Problem List: Decreased activity tolerance;Impaired balance (sitting and/or standing);Decreased knowledge of use of DME or AE      OT Treatment/Interventions: Self-care/ADL training;DME and/or AE instruction;Therapeutic activities;Patient/family education;Balance training    OT Goals(Current goals can be found in the care plan section) Acute Rehab OT Goals Patient Stated Goal: get back to walking around independently OT Goal Formulation: With patient Time For Goal Achievement: 04/07/22 Potential to Achieve Goals: Good ADL Goals Pt Will Perform Grooming: with modified independence;standing Pt Will Perform Upper Body Dressing: with modified independence;sitting Pt Will Perform Lower Body Dressing: with modified independence;sit  to/from stand Pt Will Transfer to Toilet: with modified independence;ambulating;regular height toilet Pt Will Perform Toileting - Clothing Manipulation and hygiene: with modified independence;sit to/from stand Pt Will Perform Tub/Shower Transfer: Tub transfer;with supervision;ambulating;3 in 1  OT Frequency: Min 2X/week    Co-evaluation              AM-PAC OT "6 Clicks" Daily Activity     Outcome Measure Help from another person eating meals?: None Help from another person taking care of personal grooming?: A Little Help from another person toileting, which includes using toliet, bedpan, or urinal?: A Little Help from another person bathing (including washing, rinsing, drying)?: A Little Help from another person to put on and taking off regular upper body clothing?: A Little Help from another person to put on and taking off regular lower body clothing?: A Little 6 Click Score: 19   End of Session Equipment Utilized During Treatment: Gait belt Nurse Communication: Mobility status;Precautions  Activity Tolerance: Patient tolerated treatment well Patient left: in bed;with call bell/phone within reach;with bed alarm set  OT Visit Diagnosis: Unsteadiness on feet (R26.81);Other abnormalities of gait and mobility (R26.89);BPPV                Time: [id number] OT Time Calculation (min): 22 min Charges:  OT General Charges $OT Visit: 1 Visit OT Evaluation $OT Eval Moderate Complexity: 1  [place] OTR/L Acute Rehabilitation Services Office: [place] 03/24/2022, 1:42 PM

## 2022-03-24 NOTE — Progress Notes (Signed)
[facility]                                                                              Jill Glenn, is a 57 y.o. female, DOB - 1965-04-11, [id number] Admit date - 03/23/2022    Outpatient Primary MD for the patient is Jill Lima, MD  LOS - 0  days  No chief complaint on file.      Brief summary   Patient is a 57 year old female with COPD, ongoing tobacco use, presented with persistent dizziness and unsteady gait.  Patient reported that she has been having persistent dizziness for last 1 week with unsteady gait and unable to walk straight.  She has been leaning to the right.  Denied any focal weakness, headaches, nausea or vomiting.  Also reported room spinning around her otherwise no tinnitus.  No new medications or change in medications. Patient had MRI brain, CTA head and neck, negative for acute CVA Seen by neurology, admitted for further workup.  Assessment & Plan    Principal Problem:   BPPV (benign paroxysmal positional vertigo), bilateral -Acute onset with positive Dix-Hallpike, most consistent with BPPV, debilitating in the last 1 week. -PT OT for vestibular evaluation. -MRI C-spine showed multilevel cervical spondylosis worse at C5-C6, mild spinal canal stenosis and bilateral neural foramen narrowing, severe left neural foraminal narrowing at C6-7.  Outpatient neurosurgical follow-up -Of note patient reported that she had significant right ear infection last year, treated by ED, did not follow with ENT.  Although she has no tinnitus or hearing loss, unclear if she has peripheral cause for BPPV due to ear infection.  Discussed with ENT, Dr. Constance Holster, recommended follow-up outpatient after discharge. -Continue IV fluids, added meclizine  Active Problems:   Tobacco use disorder -Continue nicotine patch    Panlobular emphysema ([place]), COPD -Resumed inhalers, DuoNebs   Estimated body mass index is 28.87 kg/m as calculated from the following:    Height as of this encounter: 5\' 3"  (1.6 m).   Weight as of this encounter: 73.9 kg.  Code Status: Full code DVT Prophylaxis:     Level of Care: Level of care: Telemetry Family Communication: Updated patient's daughter at the bedside Disposition Plan:      Remains inpatient appropriate:      Procedures:  MRI brain, MRI C-spine  Consultants:   Neurology  Antimicrobials:   Medications  fluticasone furoate-vilanterol  1 puff Inhalation Daily   And   umeclidinium bromide  1 puff Inhalation Daily   insulin aspart  0-15 Units Subcutaneous TID WC   insulin aspart  0-5 Units Subcutaneous QHS   ipratropium-albuterol  3 mL Nebulization TID   nicotine  21 mg Transdermal Daily      Subjective:   Jill Glenn was seen and examined today.  No chest pain, shortness of breath, abdominal pain, N/V/D/C, new weakness.  No fevers.  Objective:   Vitals:   03/24/22 0000 03/24/22 0154 03/24/22 0431 03/24/22 1015  BP:  122/73 108/77 116/71  Pulse:  81 72 79  Resp:  18 19 17   Temp: 98.1 F (36.7 C) (!) 97.2 F (36.2 C) 97.7 F (36.5  C) 97.7 F (36.5 C)  TempSrc:  Oral Oral Oral  SpO2:  95% 95% 98%  Weight:      Height:        Intake/Output Summary (Last 24 hours) at 03/24/2022 1316 Last data filed at 03/23/2022 2350 Gross per 24 hour  Intake 1000 ml  Output --  Net 1000 ml     Wt Readings from Last 3 Encounters:  03/23/22 73.9 kg  03/01/22 73.9 kg  02/15/22 74.3 kg     Exam General: Alert and oriented x 3, NAD Cardiovascular: S1 S2 auscultated,  RRR Respiratory: Clear to auscultation bilaterally Gastrointestinal: Soft, nontender, nondistended, + bowel sounds Ext: no pedal edema bilaterally Neuro: Strength 5/5 upper and lower extremities bilaterally Psych: Normal affect    Data Reviewed:  I have personally reviewed following labs    CBC Lab Results  Component Value Date   WBC 12.6 (H) 03/24/2022   RBC 4.62 03/24/2022   HGB 13.4 03/24/2022   HCT 40.9  03/24/2022   MCV 88.5 03/24/2022   MCH 29.0 03/24/2022   PLT 362 03/24/2022   MCHC 32.8 03/24/2022   RDW 13.7 03/24/2022   LYMPHSABS 3.3 03/23/2022   MONOABS 0.6 03/23/2022   EOSABS 0.3 03/23/2022   BASOSABS 0.1 [id number]     Last metabolic panel Lab Results  Component Value Date   NA 136 03/24/2022   K 4.0 03/24/2022   CL 106 03/24/2022   CO2 21 (L) 03/24/2022   BUN 18 03/24/2022   CREATININE 0.64 03/24/2022   GLUCOSE 142 (H) 03/24/2022   GFRNONAA >60 03/24/2022   CALCIUM 9.0 03/24/2022   PROT 7.7 03/23/2022   ALBUMIN 4.7 03/23/2022   BILITOT 0.7 03/23/2022   ALKPHOS 54 03/23/2022   AST 24 03/23/2022   ALT 21 03/23/2022   ANIONGAP 9 03/24/2022    CBG (last 3)  Recent Labs    03/24/22 0747 03/24/22 1213  GLUCAP 106* 143*      Coagulation Profile: Recent Labs  Lab 03/23/22 1341  INR 1.0     Radiology Studies: I have personally reviewed the imaging studies  MR Cervical Spine Wo Contrast  Result Date: 03/24/2022 CLINICAL DATA:  Ataxia, nontraumatic, cervical pathology suspected. EXAM: MRI CERVICAL SPINE WITHOUT CONTRAST TECHNIQUE: Multiplanar, multisequence MR imaging of the cervical spine was performed. No intravenous contrast was administered. COMPARISON:  CTA head/neck 03/23/2022. FINDINGS: Alignment: 3 mm retrolisthesis of C5 on C6. Vertebrae: Normal vertebral body heights and marrow signal. Cord: Normal spinal cord signal and volume. Posterior Fossa, vertebral arteries, paraspinal tissues: Unremarkable. Disc levels: C2-C3: Left-sided facet arthropathy and uncovertebral joint spurring results in moderate left neural foraminal narrowing. C3-C4: No disc herniation, spinal canal stenosis or neural foraminal narrowing. Mild bilateral facet arthropathy. C4-C5:  Unremarkable. C5-C6: Retrolisthesis, disc bulge and uncovertebral joint spurring results in mild spinal canal stenosis and severe bilateral neural foraminal narrowing. C6-C7: No disc herniation or spinal  canal stenosis. Moderate disc height loss, left-sided facet arthropathy and uncovertebral joint spurring results in severe left neural foraminal narrowing. C7-T1:  Normal. IMPRESSION: 1. Multilevel cervical spondylosis, worst at C5-6, where there is mild spinal canal stenosis and severe bilateral neural foraminal narrowing. 2. Severe left neural foraminal narrowing at C6-C7. 3. Moderate left neural foraminal narrowing at C2-C3. Electronically Signed   By: Emmit Alexanders M.D.   On: 03/24/2022 09:22   MR BRAIN WO CONTRAST  Result Date: 03/23/2022 CLINICAL DATA:  Initial evaluation for vertigo. EXAM: MRI HEAD WITHOUT CONTRAST TECHNIQUE: Multiplanar, multiecho pulse  sequences of the brain and surrounding structures were obtained without intravenous contrast. COMPARISON:  Prior CT from earlier the same day. FINDINGS: Brain: Cerebral volume within normal limits for age. No focal parenchymal signal abnormality. No abnormal foci of restricted diffusion to suggest acute or subacute ischemia. Gray-white matter differentiation well maintained. No encephalomalacia to suggest chronic cortical infarction or other insult. No foci of susceptibility artifact indicative of acute or chronic intracranial blood products. No mass lesion, midline shift or mass effect. Ventricles normal in size and morphology without hydrocephalus. No extra-axial fluid collection. Pituitary gland and suprasellar region within normal limits. Vascular: Major intracranial vascular flow voids are well maintained. Skull and upper cervical spine: Craniocervical junction within normal limits. Visualized upper cervical spine demonstrates no significant finding. Bone marrow signal intensity within normal limits. No scalp soft tissue abnormality. Sinuses/Orbits: Globes and orbital soft tissues are within normal limits. Paranasal sinuses are largely clear. No significant mastoid effusion. Other: None. IMPRESSION: Normal brain MRI. No acute intracranial  abnormality identified. Electronically Signed   By: Jeannine Boga M.D.   On: 03/23/2022 20:40   CT Angio Head W or Wo Contrast  Result Date: 03/23/2022 CLINICAL DATA:  Dizziness, history of migraine EXAM: CT ANGIOGRAPHY HEAD AND NECK TECHNIQUE: Multidetector CT imaging of the head and neck was performed using the standard protocol during bolus administration of intravenous contrast. Multiplanar CT image reconstructions and MIPs were obtained to evaluate the vascular anatomy. Carotid stenosis measurements (when applicable) are obtained utilizing NASCET criteria, using the distal internal carotid diameter as the denominator. RADIATION DOSE REDUCTION: This exam was performed according to the departmental dose-optimization program which includes automated exposure control, adjustment of the mA and/or kV according to patient size and/or use of iterative reconstruction technique. CONTRAST:  75mL OMNIPAQUE IOHEXOL 350 MG/ML SOLN COMPARISON:  None Available. FINDINGS: CT HEAD FINDINGS Brain: No evidence of acute infarct, hemorrhage, mass, mass effect, or midline shift. No hydrocephalus or extra-axial fluid collection. Vascular: No hyperdense vessel. Skull: Negative for fracture or focal lesion. Sinuses/Orbits: No acute finding. Other: None. CTA NECK FINDINGS Aortic arch: Standard branching. Imaged portion shows no evidence of aneurysm or dissection. No significant stenosis of the major arch vessel origins. Right carotid system: Possible small filling defect in the proximal right CCA (series 11, image 275), although this may be artifactual. No hemodynamically significant stenosis or occlusion. Left carotid system: No evidence of stenosis, dissection, or occlusion. Vertebral arteries: No evidence of stenosis, dissection, or occlusion. Skeleton: No acute osseous abnormality. Degenerative changes in the cervical spine. Other neck: 7 mm lesion with calcification in the right thyroid lobe, for which no follow-up is  currently indicated. (Reference: J Am Coll Radiol. 2015 Feb;12(2): 143-50). Upper chest: Severe centrilobular and paraseptal emphysema. No focal pulmonary opacity or pleural effusion. Review of the MIP images confirms the above findings CTA HEAD FINDINGS Anterior circulation: Both internal carotid arteries are patent to the termini, without significant stenosis. A1 segments patent. Normal anterior communicating artery. Anterior cerebral arteries are patent to their distal aspects. No M1 stenosis or occlusion. MCA branches perfused and symmetric. Posterior circulation: Vertebral arteries patent to the vertebrobasilar junction without stenosis. Posterior inferior cerebellar arteries patent proximally. Basilar patent to its distal aspect. Superior cerebellar arteries patent proximally. Patent P1 segments. PCAs perfused to their distal aspects without stenosis. The bilateral posterior communicating arteries are not visualized. Venous sinuses: As permitted by contrast timing, patent. Anatomic variants: None significant. Review of the MIP images confirms the above findings IMPRESSION: 1. No acute  intracranial process. 2. No intracranial large vessel occlusion or significant stenosis. 3. No hemodynamically significant stenosis in the neck. 4. In the proximal right CCA, there is a possible filling defect, which could represent a small dissection flap, although this is more likely artifact. Consider short interval follow-up. 5. Severe emphysema. Emphysema (ICD10-J43.9). Electronically Signed   By: Merilyn Baba M.D.   On: 03/23/2022 19:18   CT Angio Neck W and/or Wo Contrast  Result Date: 03/23/2022 CLINICAL DATA:  Dizziness, history of migraine EXAM: CT ANGIOGRAPHY HEAD AND NECK TECHNIQUE: Multidetector CT imaging of the head and neck was performed using the standard protocol during bolus administration of intravenous contrast. Multiplanar CT image reconstructions and MIPs were obtained to evaluate the vascular anatomy.  Carotid stenosis measurements (when applicable) are obtained utilizing NASCET criteria, using the distal internal carotid diameter as the denominator. RADIATION DOSE REDUCTION: This exam was performed according to the departmental dose-optimization program which includes automated exposure control, adjustment of the mA and/or kV according to patient size and/or use of iterative reconstruction technique. CONTRAST:  64mL OMNIPAQUE IOHEXOL 350 MG/ML SOLN COMPARISON:  None Available. FINDINGS: CT HEAD FINDINGS Brain: No evidence of acute infarct, hemorrhage, mass, mass effect, or midline shift. No hydrocephalus or extra-axial fluid collection. Vascular: No hyperdense vessel. Skull: Negative for fracture or focal lesion. Sinuses/Orbits: No acute finding. Other: None. CTA NECK FINDINGS Aortic arch: Standard branching. Imaged portion shows no evidence of aneurysm or dissection. No significant stenosis of the major arch vessel origins. Right carotid system: Possible small filling defect in the proximal right CCA (series 11, image 275), although this may be artifactual. No hemodynamically significant stenosis or occlusion. Left carotid system: No evidence of stenosis, dissection, or occlusion. Vertebral arteries: No evidence of stenosis, dissection, or occlusion. Skeleton: No acute osseous abnormality. Degenerative changes in the cervical spine. Other neck: 7 mm lesion with calcification in the right thyroid lobe, for which no follow-up is currently indicated. (Reference: J Am Coll Radiol. 2015 Feb;12(2): 143-50). Upper chest: Severe centrilobular and paraseptal emphysema. No focal pulmonary opacity or pleural effusion. Review of the MIP images confirms the above findings CTA HEAD FINDINGS Anterior circulation: Both internal carotid arteries are patent to the termini, without significant stenosis. A1 segments patent. Normal anterior communicating artery. Anterior cerebral arteries are patent to their distal aspects. No M1  stenosis or occlusion. MCA branches perfused and symmetric. Posterior circulation: Vertebral arteries patent to the vertebrobasilar junction without stenosis. Posterior inferior cerebellar arteries patent proximally. Basilar patent to its distal aspect. Superior cerebellar arteries patent proximally. Patent P1 segments. PCAs perfused to their distal aspects without stenosis. The bilateral posterior communicating arteries are not visualized. Venous sinuses: As permitted by contrast timing, patent. Anatomic variants: None significant. Review of the MIP images confirms the above findings IMPRESSION: 1. No acute intracranial process. 2. No intracranial large vessel occlusion or significant stenosis. 3. No hemodynamically significant stenosis in the neck. 4. In the proximal right CCA, there is a possible filling defect, which could represent a small dissection flap, although this is more likely artifact. Consider short interval follow-up. 5. Severe emphysema. Emphysema (ICD10-J43.9). Electronically Signed   By: Merilyn Baba M.D.   On: 03/23/2022 19:18   DG Chest 2 View  Result Date: 03/23/2022 CLINICAL DATA:  Cough, shortness of breath, chest pain, dizziness, and nausea since 03/17/2022, history COPD EXAM: CHEST - 2 VIEW COMPARISON:  12/21/2020 FINDINGS: Normal heart size, mediastinal contours, and pulmonary vascularity. Emphysematous and bronchitic changes consistent with COPD. Minimal biapical  scarring. No acute infiltrate, pleural effusion, or pneumothorax. Osseous structures unremarkable. IMPRESSION: COPD changes with minimal biapical scarring. No acute abnormalities. Emphysema (ICD10-J43.9). Electronically Signed   By: Lavonia Dana M.D.   On: 03/23/2022 11:12       Yanelly Cantrelle M.D. [facility] 03/24/2022, 1:16 PM  Available via Epic secure chat 7am-7pm After 7 pm, please refer to night coverage provider listed on amion.

## 2022-03-24 NOTE — Progress Notes (Signed)
Late Entry. RN notified by pharm tech at [employer]. Pt complaining of chest pain when breathing. S/p breathing treatment. Upon RN's room entry, pt's breathing is labored. Facial flushing noted. Patient stated that she didn't feel right. HR & BP stable. SPO2 100% on room air. Charge nurse notified. Rapid response nurse notified and at bedside. MD notified. RT notified. EKG completed. EKG NSR. Duoneb ordered for now. Inhalers given by RT. Patient did improve after breathing treatment and inhalers. Currently on 4LNC. Oxygen saturation decreases on exertion.

## 2022-03-24 NOTE — Consult Note (Signed)
Neurology Consultation Reason for Consult: Dizziness Referring Physician: Lia Hopping, H  CC: Dizziness  History is obtained from: Patient  HPI: Jill Glenn is a 57 y.o. female who presents with dizziness that is been present since Thursday.  She states that it happens whenever she moves around.  She is always uncomfortable, but she states that she has learned how to avoid most of it, in particular by lying on her right side.  She denies any numbness or weakness or double vision.  She was seen by her primary care who noticed some abnormal reflexes and referred her to the emergency department for evaluation for possible stroke.  In the emergency department, she had CTA and MRI.  MRI is negative.   Prior to last Thursday, she states that she did not feel like her gait was changing, but her boyfriend stated that her walking had changed.  Past Medical History:  Diagnosis Date   Asthma      Family History  Problem Relation Age of Onset   COPD Mother    Lung cancer Mother      Social History:  reports that she quit smoking about 5 months ago. Her smoking use included cigarettes. She started smoking about 41 years ago. She has a 40.00 pack-year smoking history. She has never used smokeless tobacco. She reports that she does not currently use alcohol. She reports that she does not use drugs.   Exam: Current vital signs: BP 122/73 (BP Location: Left Arm)   Pulse 81   Temp (!) 97.2 F (36.2 C) (Oral)   Resp 18   Ht 5\' 3"  (1.6 m)   Wt 73.9 kg   SpO2 95%   BMI 28.87 kg/m  Vital signs in last 24 hours: Temp:  [97.2 F (36.2 C)-98.1 F (36.7 C)] 97.2 F (36.2 C) (03/21 0154) Pulse Rate:  [68-88] 81 (03/21 0154) Resp:  [13-22] 18 (03/21 0154) BP: (98-130)/(53-97) 122/73 (03/21 0154) SpO2:  [91 %-98 %] 95 % (03/21 0154) Weight:  [73.9 kg] 73.9 kg (03/20 1237)   Physical Exam  Appears well-developed and well-nourished.   Neuro: Mental Status: Patient is awake, alert,  oriented to person, place, month, year, and situation. Patient is able to give a clear and coherent history. No signs of aphasia or neglect Cranial Nerves: II: Visual Fields are full. Pupils are equal, round, and reactive to light.   III,IV, VI: EOMI without ptosis or diploplia.  No nystagmus at rest. V: Facial sensation is symmetric to temperature VII: Facial movement is symmetric.  VIII: hearing is intact to voice X: Uvula elevates symmetrically XI: Shoulder shrug is symmetric. XII: tongue is midline without atrophy or fasciculations.  Motor: Tone is normal. Bulk is normal. 5/5 strength was present in all four extremities.  Sensory: Sensation is symmetric to light touch and temperature in the arms and legs. Cerebellar: No definite ataxia on finger-nose-finger DTR: 3+ with crossed adductors on the right, but not left, positive pectoral reflexes bilaterally, downgoing toes.   With Dix-Hallpike, she has a floridly positive response with left head turn with rotary nystagmus.   I have reviewed labs in epic and the results pertinent to this consultation are: BMP is unremarkable  I have reviewed the images obtained: MRI/CTA-no explanation for symptoms  Impression: 57 year old female with acute onset dizziness with positive Dix-Hallpike maneuver most consistent with BPPV.  Given that she is debilitated to the point where she is having significant difficulty walking, I think observation with inpatient PT evaluation and beginning  vestibular rehab would be prudent.  She is also hyperreflexic, which I think is an incidental finding and may be related to an increased adrenergic tone associated with her current suffering, but given the possibility that her gait was worsening even prior to this I think an MRI of her cervical spine to assess for myelopathy would be prudent.  I think this to be incidental to her presentation.  Recommendations: 1) MRI cervical spine 2) vestibular PT 3) neurology  will follow-up cervical spine MRI, and if negative will be available as needed.  Roland Rack, MD [facility] [phone]  If 7pm- 7am, please page neurology on call as listed in [place].

## 2022-03-24 NOTE — Progress Notes (Signed)
  Transition of Care (TOC) Screening Note   Patient Details  Name: Jill Glenn Date of Birth: [birth year]   Transition of Care [facility] CM/SW Contact:    Vassie Moselle, [place] Phone Number: 03/24/2022, 2:17 PM  Information added to AVS to follow up with PCP to obtain referral for outpatient vestibular therapy.   Transition of Care Department [facility]) has reviewed patient and no TOC needs have been identified at this time. We will continue to monitor patient advancement through interdisciplinary progression rounds. If new patient transition needs arise, please place a TOC consult.

## 2022-03-24 NOTE — Progress Notes (Signed)
Chaplain engaged in an initial visit with Jill Glenn. Chaplain worked to honor consult for an Scientist, physiological, Event organiser. [name] voiced that she does not want to complete the paperwork and is not in need of that at this time. Chaplain offered support.  Bea Graff, MDiv  03/24/22 1200  Spiritual Encounters  Type of Visit Initial  Care provided to: Patient  Reason for visit Advance directives

## 2022-03-25 DIAGNOSIS — Z801 Family history of malignant neoplasm of trachea, bronchus and lung: Secondary | ICD-10-CM | POA: Diagnosis not present

## 2022-03-25 DIAGNOSIS — Z885 Allergy status to narcotic agent status: Secondary | ICD-10-CM | POA: Diagnosis not present

## 2022-03-25 DIAGNOSIS — R262 Difficulty in walking, not elsewhere classified: Secondary | ICD-10-CM | POA: Diagnosis present

## 2022-03-25 DIAGNOSIS — Z90710 Acquired absence of both cervix and uterus: Secondary | ICD-10-CM | POA: Diagnosis not present

## 2022-03-25 DIAGNOSIS — M4802 Spinal stenosis, cervical region: Secondary | ICD-10-CM | POA: Diagnosis present

## 2022-03-25 DIAGNOSIS — M47812 Spondylosis without myelopathy or radiculopathy, cervical region: Secondary | ICD-10-CM | POA: Diagnosis present

## 2022-03-25 DIAGNOSIS — F17210 Nicotine dependence, cigarettes, uncomplicated: Secondary | ICD-10-CM | POA: Diagnosis present

## 2022-03-25 DIAGNOSIS — F419 Anxiety disorder, unspecified: Secondary | ICD-10-CM | POA: Diagnosis present

## 2022-03-25 DIAGNOSIS — J441 Chronic obstructive pulmonary disease with (acute) exacerbation: Secondary | ICD-10-CM | POA: Diagnosis not present

## 2022-03-25 DIAGNOSIS — H8113 Benign paroxysmal vertigo, bilateral: Secondary | ICD-10-CM | POA: Diagnosis present

## 2022-03-25 DIAGNOSIS — J431 Panlobular emphysema: Secondary | ICD-10-CM | POA: Diagnosis present

## 2022-03-25 DIAGNOSIS — G43E19 Chronic migraine with aura, intractable, without status migrainosus: Secondary | ICD-10-CM | POA: Diagnosis not present

## 2022-03-25 DIAGNOSIS — H8110 Benign paroxysmal vertigo, unspecified ear: Secondary | ICD-10-CM | POA: Diagnosis not present

## 2022-03-25 DIAGNOSIS — R42 Dizziness and giddiness: Secondary | ICD-10-CM | POA: Diagnosis present

## 2022-03-25 DIAGNOSIS — G43909 Migraine, unspecified, not intractable, without status migrainosus: Secondary | ICD-10-CM | POA: Diagnosis present

## 2022-03-25 DIAGNOSIS — Z7951 Long term (current) use of inhaled steroids: Secondary | ICD-10-CM | POA: Diagnosis not present

## 2022-03-25 DIAGNOSIS — Z825 Family history of asthma and other chronic lower respiratory diseases: Secondary | ICD-10-CM | POA: Diagnosis not present

## 2022-03-25 DIAGNOSIS — Z8669 Personal history of other diseases of the nervous system and sense organs: Secondary | ICD-10-CM | POA: Diagnosis not present

## 2022-03-25 DIAGNOSIS — G4700 Insomnia, unspecified: Secondary | ICD-10-CM | POA: Diagnosis present

## 2022-03-25 LAB — GLUCOSE, CAPILLARY
Glucose-Capillary: 84 mg/dL (ref 70–99)
Glucose-Capillary: 88 mg/dL (ref 70–99)
Glucose-Capillary: 166 mg/dL — ABNORMAL HIGH (ref 70–99)
Glucose-Capillary: 148 mg/dL — ABNORMAL HIGH (ref 70–99)

## 2022-03-25 LAB — BASIC METABOLIC PANEL
Anion gap: 6 (ref 5–15)
BUN: 22 mg/dL — ABNORMAL HIGH (ref 6–20)
CO2: 22 mmol/L (ref 22–32)
Calcium: 8.1 mg/dL — ABNORMAL LOW (ref 8.9–10.3)
Chloride: 112 mmol/L — ABNORMAL HIGH (ref 98–111)
Creatinine, Ser: 0.85 mg/dL (ref 0.44–1.00)
GFR, Estimated: >60 mL/min (ref >60)
Glucose, Bld: 93 mg/dL (ref 70–99)
Potassium: 3.7 mmol/L (ref 3.5–5.1)
Sodium: 140 mmol/L (ref 135–145)

## 2022-03-25 LAB — CBC
HCT: 37.5 % (ref 36.0–46.0)
Hemoglobin: 12.2 g/dL (ref 12.0–15.0)
MCH: 29.6 pg (ref 26.0–34.0)
MCHC: 32.5 g/dL (ref 30.0–36.0)
MCV: 91 fL (ref 80.0–100.0)
Platelets: 298 10*3/uL (ref 150–400)
RBC: 4.12 MIL/uL (ref 3.87–5.11)
RDW: 14 % (ref 11.5–15.5)
WBC: 9.8 10*3/uL (ref 4.0–10.5)
nRBC: 0 % (ref 0.0–0.2)

## 2022-03-25 LAB — HEMOGLOBIN A1C
Hgb A1c MFr Bld: 6 % — ABNORMAL HIGH (ref 4.8–5.6)
Mean Plasma Glucose: 126 mg/dL

## 2022-03-25 MED ORDER — PREDNISONE 20 MG PO TABS
40.0000 mg | ORAL_TABLET | Freq: Every day | ORAL | Status: DC
  Administered 2022-03-25 – 2022-03-26 (×2): 40 mg via ORAL
  Filled 2022-03-25 (×2): qty 2

## 2022-03-25 MED ORDER — BUDESONIDE 0.25 MG/2ML IN SUSP
0.2500 mg | Freq: Two times a day (BID) | RESPIRATORY_TRACT | Status: DC
  Administered 2022-03-25 – 2022-03-26 (×2): 0.25 mg via RESPIRATORY_TRACT
  Filled 2022-03-25 (×2): qty 2

## 2022-03-25 MED ORDER — PROCHLORPERAZINE EDISYLATE 10 MG/2ML IJ SOLN
10.0000 mg | Freq: Four times a day (QID) | INTRAMUSCULAR | Status: AC
  Administered 2022-03-25 (×2): 10 mg via INTRAVENOUS
  Filled 2022-03-25 (×2): qty 2

## 2022-03-25 MED ORDER — ARFORMOTEROL TARTRATE 15 MCG/2ML IN NEBU
15.0000 ug | INHALATION_SOLUTION | Freq: Two times a day (BID) | RESPIRATORY_TRACT | Status: DC
  Administered 2022-03-25 – 2022-03-26 (×2): 15 ug via RESPIRATORY_TRACT
  Filled 2022-03-25 (×2): qty 2

## 2022-03-25 MED ORDER — DIPHENHYDRAMINE HCL 50 MG/ML IJ SOLN
25.0000 mg | Freq: Four times a day (QID) | INTRAMUSCULAR | Status: AC
  Administered 2022-03-25 (×2): 25 mg via INTRAVENOUS
  Filled 2022-03-25 (×2): qty 1

## 2022-03-25 MED ORDER — KETOROLAC TROMETHAMINE 15 MG/ML IJ SOLN
15.0000 mg | Freq: Four times a day (QID) | INTRAMUSCULAR | Status: DC
  Administered 2022-03-25 – 2022-03-26 (×3): 15 mg via INTRAVENOUS
  Filled 2022-03-25 (×4): qty 1

## 2022-03-25 NOTE — Progress Notes (Signed)
[facility]                                                                              Jill Glenn, is a 57 y.o. female, DOB - 1965/11/30, [id number] Admit date - 03/23/2022    Outpatient Primary MD for the patient is Janith Lima, MD  LOS - 1  days  No chief complaint on file.      Brief summary   Patient is a 57 year old female with COPD, ongoing tobacco use, presented with persistent dizziness and unsteady gait.  Patient reported that she has been having persistent dizziness for last 1 week with unsteady gait and unable to walk straight.  She has been leaning to the right.  Denied any focal weakness, headaches, nausea or vomiting.  Also reported room spinning around her otherwise no tinnitus.  No new medications or change in medications. Patient had MRI brain, CTA head and neck, negative for acute CVA Seen by neurology, admitted for further workup.  Assessment & Plan    Principal Problem:   BPPV (benign paroxysmal positional vertigo), bilateral -Acute onset with positive Dix-Hallpike, most consistent with BPPV, debilitating in the last 1 week. -PT OT for vestibular evaluation-> outpatient  -MRI C-spine showed multilevel cervical spondylosis worse at C5-C6, foraminal narrowing at C6-C7, outpatient neurosurgical follow-up -patient reported that she had significant right ear infection last year, treated by ED, did not follow with ENT.  Although she has no tinnitus or hearing loss, unclear if she has peripheral cause for BPPV due to ear infection.  Discussed with ENT, Dr. Constance Holster, recommended follow-up outpatient after discharge. -Continue IV fluids, added meclizine  Active Problems: Cervical spondylosis - MRI C-spine showed multilevel cervical spondylosis worse at C5-C6, mild spinal canal stenosis and bilateral neural foramen narrowing, severe left neural foraminal narrowing at C6-7.  -Discussed with the patient, recommended outpatient neurosurgical  follow-up   Acute COPD exacerbation - wheezing, placed on brovana, pulmicort nebs  - Continue duonebs, added prednisone   Acute migraine - c/o migraine headache, placed on IV migraine cocktail with toradol, compazine and benadryl.      Tobacco use disorder -Continue nicotine patch  Estimated body mass index is 28.87 kg/m as calculated from the following:   Height as of this encounter: 5\' 3"  (1.6 m).   Weight as of this encounter: 73.9 kg.  Code Status: Full code DVT Prophylaxis:  enoxaparin (LOVENOX) injection 40 mg Start: 03/24/22 2200   Level of Care: Level of care: Telemetry Family Communication: Updated patient's daughter at the bedside Disposition Plan:      Remains inpatient appropriate:   likely dc in am    Procedures:  MRI brain, MRI C-spine  Consultants:   Neurology  Antimicrobials:   Medications  arformoterol  15 mcg Nebulization BID   budesonide (PULMICORT) nebulizer solution  0.25 mg Nebulization BID   diphenhydrAMINE  25 mg Intravenous Q6H   enoxaparin (LOVENOX) injection  40 mg Subcutaneous QHS   insulin aspart  0-15 Units Subcutaneous TID WC   insulin aspart  0-5 Units Subcutaneous QHS   ipratropium-albuterol  3 mL Nebulization TID   ketorolac  15 mg  Intravenous Q6H   meclizine  25 mg Oral BID   nicotine  21 mg Transdermal Daily   predniSONE  40 mg Oral QAC breakfast   prochlorperazine  10 mg Intravenous Q6H      Subjective:   Jill Glenn was seen and examined today. C/o dyspnea, wheezing, and migraine HA. States threw up this AM due to migraine.   No fevers.  Objective:   Vitals:   03/24/22 1929 03/24/22 2031 03/25/22 0608 03/25/22 0859  BP: 110/66  126/71   Pulse: 79  71   Resp: 17  17   Temp: 97.8 F (36.6 C)  98.5 F (36.9 C)   TempSrc: Oral  Oral   SpO2: 97% 99% 98% 98%  Weight:      Height:        Intake/Output Summary (Last 24 hours) at 03/25/2022 1237 Last data filed at 03/25/2022 0400 Gross per 24 hour  Intake  1312.16 ml  Output --  Net 1312.16 ml     Wt Readings from Last 3 Encounters:  03/23/22 73.9 kg  03/01/22 73.9 kg  02/15/22 74.3 kg   Physical Exam General: Alert and oriented x 3, NAD Cardiovascular: S1 S2 clear, RRR.  Respiratory: b/l scattered wheezing Gastrointestinal: Soft, nontender, nondistended, NBS Ext: no pedal edema bilaterally Neuro: no new deficits Psych: anxious   Data Reviewed:  I have personally reviewed following labs    CBC Lab Results  Component Value Date   WBC 9.8 03/25/2022   RBC 4.12 03/25/2022   HGB 12.2 03/25/2022   HCT 37.5 03/25/2022   MCV 91.0 03/25/2022   MCH 29.6 03/25/2022   PLT 298 03/25/2022   MCHC 32.5 03/25/2022   RDW 14.0 03/25/2022   LYMPHSABS 3.3 03/23/2022   MONOABS 0.6 03/23/2022   EOSABS 0.3 03/23/2022   BASOSABS 0.1 [id number]     Last metabolic panel Lab Results  Component Value Date   NA 140 03/25/2022   K 3.7 03/25/2022   CL 112 (H) 03/25/2022   CO2 22 03/25/2022   BUN 22 (H) 03/25/2022   CREATININE 0.85 03/25/2022   GLUCOSE 93 03/25/2022   GFRNONAA >60 03/25/2022   CALCIUM 8.1 (L) 03/25/2022   PROT 7.7 03/23/2022   ALBUMIN 4.7 03/23/2022   BILITOT 0.7 03/23/2022   ALKPHOS 54 03/23/2022   AST 24 03/23/2022   ALT 21 03/23/2022   ANIONGAP 6 03/25/2022    CBG (last 3)  Recent Labs    03/24/22 2120 03/25/22 0729 03/25/22 1150  GLUCAP 122* 84 88      Coagulation Profile: Recent Labs  Lab 03/23/22 1341  INR 1.0     Radiology Studies: I have personally reviewed the imaging studies  DG CHEST PORT 1 VIEW  Result Date: 03/24/2022 CLINICAL DATA:  Dyspnea.  Chest pain and shortness of breath EXAM: PORTABLE CHEST 1 VIEW COMPARISON:  03/23/2022 FINDINGS: Numerous leads and wires project over the chest. Midline trachea. Normal heart size. No pleural effusion or pneumothorax. Lucency in the upper lungs consistent with emphysema. Mild biapical scarring. No lobar consolidation. IMPRESSION: 1. No acute  cardiopulmonary disease. 2. Emphysema. Electronically Signed   By: Abigail Miyamoto M.D.   On: 03/24/2022 17:33   MR Cervical Spine Wo Contrast  Result Date: 03/24/2022 CLINICAL DATA:  Ataxia, nontraumatic, cervical pathology suspected. EXAM: MRI CERVICAL SPINE WITHOUT CONTRAST TECHNIQUE: Multiplanar, multisequence MR imaging of the cervical spine was performed. No intravenous contrast was administered. COMPARISON:  CTA head/neck 03/23/2022. FINDINGS: Alignment: 3 mm retrolisthesis of  C5 on C6. Vertebrae: Normal vertebral body heights and marrow signal. Cord: Normal spinal cord signal and volume. Posterior Fossa, vertebral arteries, paraspinal tissues: Unremarkable. Disc levels: C2-C3: Left-sided facet arthropathy and uncovertebral joint spurring results in moderate left neural foraminal narrowing. C3-C4: No disc herniation, spinal canal stenosis or neural foraminal narrowing. Mild bilateral facet arthropathy. C4-C5:  Unremarkable. C5-C6: Retrolisthesis, disc bulge and uncovertebral joint spurring results in mild spinal canal stenosis and severe bilateral neural foraminal narrowing. C6-C7: No disc herniation or spinal canal stenosis. Moderate disc height loss, left-sided facet arthropathy and uncovertebral joint spurring results in severe left neural foraminal narrowing. C7-T1:  Normal. IMPRESSION: 1. Multilevel cervical spondylosis, worst at C5-6, where there is mild spinal canal stenosis and severe bilateral neural foraminal narrowing. 2. Severe left neural foraminal narrowing at C6-C7. 3. Moderate left neural foraminal narrowing at C2-C3. Electronically Signed   By: Emmit Alexanders M.D.   On: 03/24/2022 09:22   MR BRAIN WO CONTRAST  Result Date: 03/23/2022 CLINICAL DATA:  Initial evaluation for vertigo. EXAM: MRI HEAD WITHOUT CONTRAST TECHNIQUE: Multiplanar, multiecho pulse sequences of the brain and surrounding structures were obtained without intravenous contrast. COMPARISON:  Prior CT from earlier the same  day. FINDINGS: Brain: Cerebral volume within normal limits for age. No focal parenchymal signal abnormality. No abnormal foci of restricted diffusion to suggest acute or subacute ischemia. Gray-white matter differentiation well maintained. No encephalomalacia to suggest chronic cortical infarction or other insult. No foci of susceptibility artifact indicative of acute or chronic intracranial blood products. No mass lesion, midline shift or mass effect. Ventricles normal in size and morphology without hydrocephalus. No extra-axial fluid collection. Pituitary gland and suprasellar region within normal limits. Vascular: Major intracranial vascular flow voids are well maintained. Skull and upper cervical spine: Craniocervical junction within normal limits. Visualized upper cervical spine demonstrates no significant finding. Bone marrow signal intensity within normal limits. No scalp soft tissue abnormality. Sinuses/Orbits: Globes and orbital soft tissues are within normal limits. Paranasal sinuses are largely clear. No significant mastoid effusion. Other: None. IMPRESSION: Normal brain MRI. No acute intracranial abnormality identified. Electronically Signed   By: Jeannine Boga M.D.   On: 03/23/2022 20:40   CT Angio Head W or Wo Contrast  Result Date: 03/23/2022 CLINICAL DATA:  Dizziness, history of migraine EXAM: CT ANGIOGRAPHY HEAD AND NECK TECHNIQUE: Multidetector CT imaging of the head and neck was performed using the standard protocol during bolus administration of intravenous contrast. Multiplanar CT image reconstructions and MIPs were obtained to evaluate the vascular anatomy. Carotid stenosis measurements (when applicable) are obtained utilizing NASCET criteria, using the distal internal carotid diameter as the denominator. RADIATION DOSE REDUCTION: This exam was performed according to the departmental dose-optimization program which includes automated exposure control, adjustment of the mA and/or kV  according to patient size and/or use of iterative reconstruction technique. CONTRAST:  6mL OMNIPAQUE IOHEXOL 350 MG/ML SOLN COMPARISON:  None Available. FINDINGS: CT HEAD FINDINGS Brain: No evidence of acute infarct, hemorrhage, mass, mass effect, or midline shift. No hydrocephalus or extra-axial fluid collection. Vascular: No hyperdense vessel. Skull: Negative for fracture or focal lesion. Sinuses/Orbits: No acute finding. Other: None. CTA NECK FINDINGS Aortic arch: Standard branching. Imaged portion shows no evidence of aneurysm or dissection. No significant stenosis of the major arch vessel origins. Right carotid system: Possible small filling defect in the proximal right CCA (series 11, image 275), although this may be artifactual. No hemodynamically significant stenosis or occlusion. Left carotid system: No evidence of stenosis, dissection, or  occlusion. Vertebral arteries: No evidence of stenosis, dissection, or occlusion. Skeleton: No acute osseous abnormality. Degenerative changes in the cervical spine. Other neck: 7 mm lesion with calcification in the right thyroid lobe, for which no follow-up is currently indicated. (Reference: J Am Coll Radiol. 2015 Feb;12(2): 143-50). Upper chest: Severe centrilobular and paraseptal emphysema. No focal pulmonary opacity or pleural effusion. Review of the MIP images confirms the above findings CTA HEAD FINDINGS Anterior circulation: Both internal carotid arteries are patent to the termini, without significant stenosis. A1 segments patent. Normal anterior communicating artery. Anterior cerebral arteries are patent to their distal aspects. No M1 stenosis or occlusion. MCA branches perfused and symmetric. Posterior circulation: Vertebral arteries patent to the vertebrobasilar junction without stenosis. Posterior inferior cerebellar arteries patent proximally. Basilar patent to its distal aspect. Superior cerebellar arteries patent proximally. Patent P1 segments. PCAs  perfused to their distal aspects without stenosis. The bilateral posterior communicating arteries are not visualized. Venous sinuses: As permitted by contrast timing, patent. Anatomic variants: None significant. Review of the MIP images confirms the above findings IMPRESSION: 1. No acute intracranial process. 2. No intracranial large vessel occlusion or significant stenosis. 3. No hemodynamically significant stenosis in the neck. 4. In the proximal right CCA, there is a possible filling defect, which could represent a small dissection flap, although this is more likely artifact. Consider short interval follow-up. 5. Severe emphysema. Emphysema (ICD10-J43.9). Electronically Signed   By: Merilyn Baba M.D.   On: 03/23/2022 19:18   CT Angio Neck W and/or Wo Contrast  Result Date: 03/23/2022 CLINICAL DATA:  Dizziness, history of migraine EXAM: CT ANGIOGRAPHY HEAD AND NECK TECHNIQUE: Multidetector CT imaging of the head and neck was performed using the standard protocol during bolus administration of intravenous contrast. Multiplanar CT image reconstructions and MIPs were obtained to evaluate the vascular anatomy. Carotid stenosis measurements (when applicable) are obtained utilizing NASCET criteria, using the distal internal carotid diameter as the denominator. RADIATION DOSE REDUCTION: This exam was performed according to the departmental dose-optimization program which includes automated exposure control, adjustment of the mA and/or kV according to patient size and/or use of iterative reconstruction technique. CONTRAST:  56mL OMNIPAQUE IOHEXOL 350 MG/ML SOLN COMPARISON:  None Available. FINDINGS: CT HEAD FINDINGS Brain: No evidence of acute infarct, hemorrhage, mass, mass effect, or midline shift. No hydrocephalus or extra-axial fluid collection. Vascular: No hyperdense vessel. Skull: Negative for fracture or focal lesion. Sinuses/Orbits: No acute finding. Other: None. CTA NECK FINDINGS Aortic arch: Standard  branching. Imaged portion shows no evidence of aneurysm or dissection. No significant stenosis of the major arch vessel origins. Right carotid system: Possible small filling defect in the proximal right CCA (series 11, image 275), although this may be artifactual. No hemodynamically significant stenosis or occlusion. Left carotid system: No evidence of stenosis, dissection, or occlusion. Vertebral arteries: No evidence of stenosis, dissection, or occlusion. Skeleton: No acute osseous abnormality. Degenerative changes in the cervical spine. Other neck: 7 mm lesion with calcification in the right thyroid lobe, for which no follow-up is currently indicated. (Reference: J Am Coll Radiol. 2015 Feb;12(2): 143-50). Upper chest: Severe centrilobular and paraseptal emphysema. No focal pulmonary opacity or pleural effusion. Review of the MIP images confirms the above findings CTA HEAD FINDINGS Anterior circulation: Both internal carotid arteries are patent to the termini, without significant stenosis. A1 segments patent. Normal anterior communicating artery. Anterior cerebral arteries are patent to their distal aspects. No M1 stenosis or occlusion. MCA branches perfused and symmetric. Posterior circulation: Vertebral arteries patent  to the vertebrobasilar junction without stenosis. Posterior inferior cerebellar arteries patent proximally. Basilar patent to its distal aspect. Superior cerebellar arteries patent proximally. Patent P1 segments. PCAs perfused to their distal aspects without stenosis. The bilateral posterior communicating arteries are not visualized. Venous sinuses: As permitted by contrast timing, patent. Anatomic variants: None significant. Review of the MIP images confirms the above findings IMPRESSION: 1. No acute intracranial process. 2. No intracranial large vessel occlusion or significant stenosis. 3. No hemodynamically significant stenosis in the neck. 4. In the proximal right CCA, there is a possible  filling defect, which could represent a small dissection flap, although this is more likely artifact. Consider short interval follow-up. 5. Severe emphysema. Emphysema (ICD10-J43.9). Electronically Signed   By: Merilyn Baba M.D.   On: 03/23/2022 19:18       Vignesh Willert M.D. [facility] 03/25/2022, 12:37 PM  Available via Epic secure chat 7am-7pm After 7 pm, please refer to night coverage provider listed on amion.

## 2022-03-25 NOTE — Progress Notes (Signed)
Physical Therapy Treatment Patient Details Name: Jill Glenn MRN: [id number] DOB: October 10, 1965 Today's Date: 03/25/2022   History of Present Illness Jill Glenn is a 57 y.o. female admitted 03/23/22 with dizziness with mobility. She was seen by her primary care who noticed some abnormal reflexes and referred her to the emergency department for evaluation for possible stroke and BPPV. CTA and MRI negative for acute abnormalities. PMH includes Asthma, COPD.    PT Comments    Pt reports having a migraine this morning but feeling much better at this time.  Performed Left Marye Round again and pt with mild rotary nystagmus lasting approx 15 seconds so performed canalith repositioning technique (Epley's maneuver) for treatment.  Pt then ambulated in hallway again.  Pt encouraged to f/u with OP vestibular PT if symptoms persist. Pt anticipates d/c home tomorrow.   Recommendations for follow up therapy are one component of a multi-disciplinary discharge planning process, led by the attending physician.  Recommendations may be updated based on patient status, additional functional criteria and insurance authorization.  Follow Up Recommendations  Outpatient PT (vestibular rehab if symptoms remain)     Assistance Recommended at Discharge PRN  Patient can return home with the following A little help with walking and/or transfers;Help with stairs or ramp for entrance   Equipment Recommendations  None recommended by PT    Recommendations for Other Services       Precautions / Restrictions Precautions Precautions: Fall     Mobility  Bed Mobility Overal bed mobility: Modified Independent                  Transfers Overall transfer level: Modified independent                      Ambulation/Gait Ambulation/Gait assistance: Supervision, Min guard Gait Distance (Feet): 200 Feet Assistive device: None Gait Pattern/deviations: Ataxic, Decreased stride length,  Step-through pattern Gait velocity: decr     General Gait Details: slow, improved gait pattern compared to yesterday however still mildly unsteady/ataxic gait; denies dizziness/spinning but did report one instance of feeling off balance   Stairs             Wheelchair Mobility    Modified Rankin (Stroke Patients Only)       Balance                                            Cognition Arousal/Alertness: Awake/alert Behavior During Therapy: WFL for tasks assessed/performed Overall Cognitive Status: Within Functional Limits for tasks assessed                                          Exercises      General Comments        Pertinent Vitals/Pain Pain Assessment Pain Assessment: No/denies pain    Home Living                          Prior Function            PT Goals (current goals can now be found in the care plan section) Progress towards PT goals: Progressing toward goals    Frequency    Min 3X/week      PT Plan  Current plan remains appropriate    Co-evaluation              AM-PAC PT "6 Clicks" Mobility   Outcome Measure  Help needed turning from your back to your side while in a flat bed without using bedrails?: None Help needed moving from lying on your back to sitting on the side of a flat bed without using bedrails?: None Help needed moving to and from a bed to a chair (including a wheelchair)?: A Little Help needed standing up from a chair using your arms (e.g., wheelchair or bedside chair)?: A Little Help needed to walk in hospital room?: A Little Help needed climbing 3-5 steps with a railing? : A Little 6 Click Score: 20    End of Session Equipment Utilized During Treatment: Gait belt Activity Tolerance: Patient tolerated treatment well Patient left: in bed;with call bell/phone within reach;with bed alarm set Nurse Communication: Mobility status PT Visit Diagnosis: Ataxic gait  (R26.0);BPPV BPPV - Right/Left : Left     Time: [id number] PT Time Calculation (min) (ACUTE ONLY): 19 min  Charges:  $Canalith Rep Proc: 8-22 mins                    Jill Glenn PT, DPT Physical Therapist Acute Rehabilitation Services Preferred contact method: Secure Chat Weekend Pager Only: [phone] Office: [phone]    Jill Glenn [place] 03/25/2022, 3:50 PM

## 2022-03-26 DIAGNOSIS — G43E19 Chronic migraine with aura, intractable, without status migrainosus: Secondary | ICD-10-CM

## 2022-03-26 LAB — GLUCOSE, CAPILLARY: Glucose-Capillary: 73 mg/dL (ref 70–99)

## 2022-03-26 MED ORDER — MECLIZINE HCL 25 MG PO TABS: 25.0000 mg | ORAL_TABLET | Freq: Three times a day (TID) | ORAL | 0 refills | Status: DC | PRN

## 2022-03-26 MED ORDER — ONDANSETRON HCL 4 MG PO TABS: 4.0000 mg | ORAL_TABLET | Freq: Four times a day (QID) | ORAL | 0 refills | Status: DC | PRN

## 2022-03-26 MED ORDER — PREDNISONE 20 MG PO TABS: 40.0000 mg | ORAL_TABLET | Freq: Every day | ORAL | 0 refills | Status: AC

## 2022-03-26 MED ORDER — CYCLOBENZAPRINE HCL 5 MG PO TABS: 5.0000 mg | ORAL_TABLET | Freq: Two times a day (BID) | ORAL | Status: DC | PRN

## 2022-03-26 MED ORDER — BUSPIRONE HCL 5 MG PO TABS: 5.0000 mg | ORAL_TABLET | Freq: Every morning | ORAL | 0 refills | Status: DC

## 2022-03-26 MED ORDER — RIZATRIPTAN BENZOATE 10 MG PO TABS: 10.0000 mg | ORAL_TABLET | Freq: Once | ORAL | 2 refills | Status: DC | PRN

## 2022-03-26 MED ORDER — BUSPIRONE HCL 5 MG PO TABS: 5.0000 mg | ORAL_TABLET | Freq: Every morning | ORAL | Status: DC

## 2022-03-26 MED ORDER — CYCLOBENZAPRINE HCL 5 MG PO TABS: 5.0000 mg | ORAL_TABLET | Freq: Two times a day (BID) | ORAL | 0 refills | Status: DC | PRN

## 2022-03-26 NOTE — Discharge Summary (Signed)
Physician Discharge Summary   Patient: Jill Glenn MRN: [id number] DOB: [birth year]  Admit date:     03/23/2022  Discharge date: 03/26/22  Discharge Physician: Estill Cotta, MD    PCP: Janith Lima, MD   Recommendations at discharge:   Recommended outpatient vestibular rehab for BPPV Started on buspirone 5 mg daily for anxiety, can be increased if tolerating dose Started on Flexeril 5 mg twice daily as needed for C spine DJD, muscle spasms.  Needs outpatient neurosurgery follow-up. 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  Patient reported that she has been having persistent dizziness for last 1 week with unsteady gait and unable to walk straight.  She has been leaning to the right.  Denied any focal weakness, headaches, nausea or vomiting.  Also reported room spinning around her otherwise no tinnitus.  No new medications or change in medications. Patient had MRI brain, CTA head and neck, negative for acute CVA Seen by neurology, admitted for further workup.   Assessment and Plan:   BPPV (benign paroxysmal positional vertigo), bilateral -Acute onset with positive Dix-Hallpike, most consistent with BPPV, debilitating in the last 1 week.  Patient had MRI brain, CTA head and neck negative for acute CVA.  Seen by neurology. -MRI C-spine showed multilevel cervical spondylosis worse at C5-C6, foraminal narrowing at C6-C7, outpatient neurosurgical follow-up -patient reported that she had significant right ear infection  last year, treated by ED, did not follow with ENT.  Although she has no tinnitus or hearing loss, unclear if she has peripheral cause for BPPV due to ear infection.  Discussed with ENT, Dr. Constance Holster, recommended follow-up outpatient after discharge. -Added meclizine as needed  -PT OT for vestibular evaluation-> outpatient vestibular rehab    Cervical spondylosis - MRI C-spine showed multilevel cervical spondylosis worse at C5-C6, mild spinal canal stenosis and bilateral neural foramen narrowing, severe left neural foraminal narrowing at C6-7.  -Discussed with the patient, recommended outpatient neurosurgical follow-up -Continue Flexeril as needed for pain and muscle spasms     Acute COPD exacerbation -Wheezing much improved, -Patient was placed on DuoNebs, Brovana, Pulmicort while inpatient - continue Breztri, albuterol as needed outpatient  - Continue prednisone, outpatient follow-up with Dr. [name]   Acute migraine - c/o acute migraine headache, placed on IV migraine cocktail with toradol, compazine and benadryl with significant improvement -Patient recommended to follow-up with [facility] neurology for migraines.      Tobacco use disorder -Continue nicotine patch   Estimated body mass index is 28.87 kg/m as calculated from the following:   Height as of this encounter: 5\' 3"  (1.6 m).   Weight as of this encounter: 73.9 kg.     Pain control - [employer] Controlled Substance Reporting System database was reviewed. and patient was instructed, not to drive, operate heavy machinery, perform activities at heights, swimming or participation in water activities or provide baby-sitting services while on Pain, Sleep and Anxiety Medications; until their outpatient Physician has advised to do so again. Also recommended to not to take more than prescribed Pain, Sleep and Anxiety Medications.  Consultants: Neurology Procedures performed:   Disposition: Home Diet recommendation:  Discharge Diet  Orders (From admission, onward)     Start     Ordered   03/26/22 0000  Diet - low sodium heart healthy        03/26/22 0930           Cardiac diet DISCHARGE MEDICATION: Allergies as of 03/26/2022       Reactions   Codeine Nausea And Vomiting, Palpitations, Other (See Comments)   Cold sweats, also        Medication List     STOP taking these medications    GOODY HEADACHE PO   varenicline 1 MG tablet Commonly known as: Chantix Continuing Month Pak       TAKE these medications    acetaminophen 325 MG tablet Commonly known as: TYLENOL Take 2 tablets (650 mg total) by mouth every 6 (six) hours as needed for moderate pain, mild pain or headache.   albuterol 108 (90 Base) MCG/ACT inhaler Commonly known as: VENTOLIN HFA Inhale 2 puffs into the lungs every 6 (six) hours as needed for wheezing or shortness of breath.   Breztri Aerosphere 160-9-4.8 MCG/ACT Aero Generic drug: Budeson-Glycopyrrol-Formoterol Inhale 2 puffs into the lungs in the morning and at bedtime.   busPIRone 5 MG tablet Commonly known as: BUSPAR Take 1 tablet (5 mg total) by mouth in the morning.   Cranberry 500 MG Tabs Take 500 mg by mouth daily as needed (for UTI prevention).   cyclobenzaprine 5 MG tablet Commonly known as: FLEXERIL Take 1 tablet (5 mg total) by mouth 2 (two) times daily as needed for muscle spasms.   meclizine 25 MG tablet Commonly known as: ANTIVERT Take 1 tablet (25 mg total) by mouth 3 (three) times daily as needed for dizziness. Also available OTC   Multivitamin Adult Tabs Take 1 tablet by mouth daily with breakfast.   Nicorette 2 MG lozenge Generic drug: nicotine polacrilex Take 2 mg by mouth as needed for smoking cessation.   ondansetron 4 MG tablet Commonly known as: ZOFRAN Take 1 tablet (4 mg total) by mouth every 6 (six) hours as needed for nausea or vomiting.   predniSONE 20 MG tablet Commonly known as: DELTASONE Take 2 tablets (40 mg total) by mouth  daily before breakfast for 4 days. Start taking on: March 27, 2022   rizatriptan 10 MG tablet Commonly known as: Maxalt Take 1 tablet (10 mg total) by mouth once as needed for migraine. May repeat in 2 hours if needed   Tylenol PM Extra Strength 25-500 MG Tabs tablet Generic drug: diphenhydramine-acetaminophen Take 2 tablets by mouth at bedtime as needed (for sleep).   VITAMIN C GUMMIES PO Take 500 mg by mouth daily.        Follow-up Information     Izora Gala, MD. Schedule an appointment as soon as possible for a visit in 2 week(s).   Specialty: Otolaryngology Why: for hospital follow-up. I spoke with Dr Constance Holster, so please make an appt to follow-up in 1-2 weeks. Contact information: [place] [place] [address] [phone]         [facility]. Call.   Specialty: Rehabilitation Why: Please have your primary care provider refer you to vestibular rehab at this location. Contact information: [address] [place]        Janith Lima, MD. Call.   Specialty: Internal Medicine Why: Contact provider to  obtain referral for outpatient vestibular rehab Contact information: [place] [address] [phone]         Melvenia Beam, MD. Schedule an appointment as soon as possible for a visit in 2 week(s).   Specialty: Neurology Why: for hospital follow-up/ Migraines.  Referral sent. Contact information: [address] [place] [address] [phone]         Newman Pies, MD. Schedule an appointment as soon as possible for a visit in 2 week(s).   Specialty: Neurosurgery Why: for hospital follow-up/ neuro surgery for cervical spine disease Contact information: [address] [place] [address] [phone]                Discharge Exam: Danley Danker Weights   03/23/22 1237  Weight: 73.9  kg   S: Feels a lot better today, headache and nausea much improved.  BP (!) 133/97 (BP Location: Left Arm)   Pulse 68   Temp (!) 97.5 F (36.4 C) (Oral)   Resp 18   Ht 5\' 3"  (1.6 m)   Wt 73.9 kg   SpO2 99%   BMI 28.87 kg/m    Physical Exam General: Alert and oriented x 3, NAD Cardiovascular: S1 S2 clear, RRR.  Respiratory: CTAB, no wheezing, rales or rhonchi Gastrointestinal: Soft, nontender, nondistended, NBS Ext: no pedal edema bilaterally Neuro: no new deficits Psych: Normal affect     Condition at discharge: fair  The results of significant diagnostics from this hospitalization (including imaging, microbiology, ancillary and laboratory) are listed below for reference.   Imaging Studies: DG CHEST PORT 1 VIEW  Result Date: 03/24/2022 CLINICAL DATA:  Dyspnea.  Chest pain and shortness of breath EXAM: PORTABLE CHEST 1 VIEW COMPARISON:  03/23/2022 FINDINGS: Numerous leads and wires project over the chest. Midline trachea. Normal heart size. No pleural effusion or pneumothorax. Lucency in the upper lungs consistent with emphysema. Mild biapical scarring. No lobar consolidation. IMPRESSION: 1. No acute cardiopulmonary disease. 2. Emphysema. Electronically Signed   By: Abigail Miyamoto M.D.   On: 03/24/2022 17:33   MR Cervical Spine Wo Contrast  Result Date: 03/24/2022 CLINICAL DATA:  Ataxia, nontraumatic, cervical pathology suspected. EXAM: MRI CERVICAL SPINE WITHOUT CONTRAST TECHNIQUE: Multiplanar, multisequence MR imaging of the cervical spine was performed. No intravenous contrast was administered. COMPARISON:  CTA head/neck 03/23/2022. FINDINGS: Alignment: 3 mm retrolisthesis of C5 on C6. Vertebrae: Normal vertebral body heights and marrow signal. Cord: Normal spinal cord signal and volume. Posterior Fossa, vertebral arteries, paraspinal tissues: Unremarkable. Disc levels: C2-C3: Left-sided facet arthropathy and uncovertebral joint spurring results in moderate left neural foraminal  narrowing. C3-C4: No disc herniation, spinal canal stenosis or neural foraminal narrowing. Mild bilateral facet arthropathy. C4-C5:  Unremarkable. C5-C6: Retrolisthesis, disc bulge and uncovertebral joint spurring results in mild spinal canal stenosis and severe bilateral neural foraminal narrowing. C6-C7: No disc herniation or spinal canal stenosis. Moderate disc height loss, left-sided facet arthropathy and uncovertebral joint spurring results in severe left neural foraminal narrowing. C7-T1:  Normal. IMPRESSION: 1. Multilevel cervical spondylosis, worst at C5-6, where there is mild spinal canal stenosis and severe bilateral neural foraminal narrowing. 2. Severe left neural foraminal narrowing at C6-C7. 3. Moderate left neural foraminal narrowing at C2-C3. Electronically Signed   By: Emmit Alexanders M.D.   On: 03/24/2022 09:22   MR BRAIN WO CONTRAST  Result Date: 03/23/2022 CLINICAL DATA:  Initial evaluation for vertigo. EXAM: MRI HEAD WITHOUT CONTRAST TECHNIQUE: Multiplanar, multiecho pulse sequences of the  brain and surrounding structures were obtained without intravenous contrast. COMPARISON:  Prior CT from earlier the same day. FINDINGS: Brain: Cerebral volume within normal limits for age. No focal parenchymal signal abnormality. No abnormal foci of restricted diffusion to suggest acute or subacute ischemia. Gray-white matter differentiation well maintained. No encephalomalacia to suggest chronic cortical infarction or other insult. No foci of susceptibility artifact indicative of acute or chronic intracranial blood products. No mass lesion, midline shift or mass effect. Ventricles normal in size and morphology without hydrocephalus. No extra-axial fluid collection. Pituitary gland and suprasellar region within normal limits. Vascular: Major intracranial vascular flow voids are well maintained. Skull and upper cervical spine: Craniocervical junction within normal limits. Visualized upper cervical spine  demonstrates no significant finding. Bone marrow signal intensity within normal limits. No scalp soft tissue abnormality. Sinuses/Orbits: Globes and orbital soft tissues are within normal limits. Paranasal sinuses are largely clear. No significant mastoid effusion. Other: None. IMPRESSION: Normal brain MRI. No acute intracranial abnormality identified. Electronically Signed   By: Jeannine Boga M.D.   On: 03/23/2022 20:40   CT Angio Head W or Wo Contrast  Result Date: 03/23/2022 CLINICAL DATA:  Dizziness, history of migraine EXAM: CT ANGIOGRAPHY HEAD AND NECK TECHNIQUE: Multidetector CT imaging of the head and neck was performed using the standard protocol during bolus administration of intravenous contrast. Multiplanar CT image reconstructions and MIPs were obtained to evaluate the vascular anatomy. Carotid stenosis measurements (when applicable) are obtained utilizing NASCET criteria, using the distal internal carotid diameter as the denominator. RADIATION DOSE REDUCTION: This exam was performed according to the departmental dose-optimization program which includes automated exposure control, adjustment of the mA and/or kV according to patient size and/or use of iterative reconstruction technique. CONTRAST:  2mL OMNIPAQUE IOHEXOL 350 MG/ML SOLN COMPARISON:  None Available. FINDINGS: CT HEAD FINDINGS Brain: No evidence of acute infarct, hemorrhage, mass, mass effect, or midline shift. No hydrocephalus or extra-axial fluid collection. Vascular: No hyperdense vessel. Skull: Negative for fracture or focal lesion. Sinuses/Orbits: No acute finding. Other: None. CTA NECK FINDINGS Aortic arch: Standard branching. Imaged portion shows no evidence of aneurysm or dissection. No significant stenosis of the major arch vessel origins. Right carotid system: Possible small filling defect in the proximal right CCA (series 11, image 275), although this may be artifactual. No hemodynamically significant stenosis or  occlusion. Left carotid system: No evidence of stenosis, dissection, or occlusion. Vertebral arteries: No evidence of stenosis, dissection, or occlusion. Skeleton: No acute osseous abnormality. Degenerative changes in the cervical spine. Other neck: 7 mm lesion with calcification in the right thyroid lobe, for which no follow-up is currently indicated. (Reference: J Am Coll Radiol. 2015 Feb;12(2): 143-50). Upper chest: Severe centrilobular and paraseptal emphysema. No focal pulmonary opacity or pleural effusion. Review of the MIP images confirms the above findings CTA HEAD FINDINGS Anterior circulation: Both internal carotid arteries are patent to the termini, without significant stenosis. A1 segments patent. Normal anterior communicating artery. Anterior cerebral arteries are patent to their distal aspects. No M1 stenosis or occlusion. MCA branches perfused and symmetric. Posterior circulation: Vertebral arteries patent to the vertebrobasilar junction without stenosis. Posterior inferior cerebellar arteries patent proximally. Basilar patent to its distal aspect. Superior cerebellar arteries patent proximally. Patent P1 segments. PCAs perfused to their distal aspects without stenosis. The bilateral posterior communicating arteries are not visualized. Venous sinuses: As permitted by contrast timing, patent. Anatomic variants: None significant. Review of the MIP images confirms the above findings IMPRESSION: 1. No acute intracranial process. 2.  No intracranial large vessel occlusion or significant stenosis. 3. No hemodynamically significant stenosis in the neck. 4. In the proximal right CCA, there is a possible filling defect, which could represent a small dissection flap, although this is more likely artifact. Consider short interval follow-up. 5. Severe emphysema. Emphysema (ICD10-J43.9). Electronically Signed   By: Merilyn Baba M.D.   On: 03/23/2022 19:18   CT Angio Neck W and/or Wo Contrast  Result Date:  03/23/2022 CLINICAL DATA:  Dizziness, history of migraine EXAM: CT ANGIOGRAPHY HEAD AND NECK TECHNIQUE: Multidetector CT imaging of the head and neck was performed using the standard protocol during bolus administration of intravenous contrast. Multiplanar CT image reconstructions and MIPs were obtained to evaluate the vascular anatomy. Carotid stenosis measurements (when applicable) are obtained utilizing NASCET criteria, using the distal internal carotid diameter as the denominator. RADIATION DOSE REDUCTION: This exam was performed according to the departmental dose-optimization program which includes automated exposure control, adjustment of the mA and/or kV according to patient size and/or use of iterative reconstruction technique. CONTRAST:  76mL OMNIPAQUE IOHEXOL 350 MG/ML SOLN COMPARISON:  None Available. FINDINGS: CT HEAD FINDINGS Brain: No evidence of acute infarct, hemorrhage, mass, mass effect, or midline shift. No hydrocephalus or extra-axial fluid collection. Vascular: No hyperdense vessel. Skull: Negative for fracture or focal lesion. Sinuses/Orbits: No acute finding. Other: None. CTA NECK FINDINGS Aortic arch: Standard branching. Imaged portion shows no evidence of aneurysm or dissection. No significant stenosis of the major arch vessel origins. Right carotid system: Possible small filling defect in the proximal right CCA (series 11, image 275), although this may be artifactual. No hemodynamically significant stenosis or occlusion. Left carotid system: No evidence of stenosis, dissection, or occlusion. Vertebral arteries: No evidence of stenosis, dissection, or occlusion. Skeleton: No acute osseous abnormality. Degenerative changes in the cervical spine. Other neck: 7 mm lesion with calcification in the right thyroid lobe, for which no follow-up is currently indicated. (Reference: J Am Coll Radiol. 2015 Feb;12(2): 143-50). Upper chest: Severe centrilobular and paraseptal emphysema. No focal pulmonary  opacity or pleural effusion. Review of the MIP images confirms the above findings CTA HEAD FINDINGS Anterior circulation: Both internal carotid arteries are patent to the termini, without significant stenosis. A1 segments patent. Normal anterior communicating artery. Anterior cerebral arteries are patent to their distal aspects. No M1 stenosis or occlusion. MCA branches perfused and symmetric. Posterior circulation: Vertebral arteries patent to the vertebrobasilar junction without stenosis. Posterior inferior cerebellar arteries patent proximally. Basilar patent to its distal aspect. Superior cerebellar arteries patent proximally. Patent P1 segments. PCAs perfused to their distal aspects without stenosis. The bilateral posterior communicating arteries are not visualized. Venous sinuses: As permitted by contrast timing, patent. Anatomic variants: None significant. Review of the MIP images confirms the above findings IMPRESSION: 1. No acute intracranial process. 2. No intracranial large vessel occlusion or significant stenosis. 3. No hemodynamically significant stenosis in the neck. 4. In the proximal right CCA, there is a possible filling defect, which could represent a small dissection flap, although this is more likely artifact. Consider short interval follow-up. 5. Severe emphysema. Emphysema (ICD10-J43.9). Electronically Signed   By: Merilyn Baba M.D.   On: 03/23/2022 19:18   DG Chest 2 View  Result Date: 03/23/2022 CLINICAL DATA:  Cough, shortness of breath, chest pain, dizziness, and nausea since 03/17/2022, history COPD EXAM: CHEST - 2 VIEW COMPARISON:  12/21/2020 FINDINGS: Normal heart size, mediastinal contours, and pulmonary vascularity. Emphysematous and bronchitic changes consistent with COPD. Minimal biapical scarring. No acute  infiltrate, pleural effusion, or pneumothorax. Osseous structures unremarkable. IMPRESSION: COPD changes with minimal biapical scarring. No acute abnormalities. Emphysema  (ICD10-J43.9). Electronically Signed   By: Lavonia Dana M.D.   On: 03/23/2022 11:12    Microbiology: Results for orders placed or performed during the hospital encounter of 11/02/20  Resp Panel by RT-PCR (Flu A&B, Covid) Nasopharyngeal Swab     Status: Abnormal   Collection Time: 11/02/20  7:32 AM   Specimen: Nasopharyngeal Swab; Nasopharyngeal(NP) swabs in vial transport medium  Result Value Ref Range Status   SARS Coronavirus 2 by RT PCR NEGATIVE NEGATIVE Final    Comment: (NOTE) SARS-CoV-2 target nucleic acids are NOT DETECTED.  The SARS-CoV-2 RNA is generally detectable in upper respiratory specimens during the acute phase of infection. The lowest concentration of SARS-CoV-2 viral copies this assay can detect is 138 copies/mL. A negative result does not preclude SARS-Cov-2 infection and should not be used as the sole basis for treatment or other patient management decisions. A negative result may occur with  improper specimen collection/handling, submission of specimen other than nasopharyngeal swab, presence of viral mutation(s) within the areas targeted by this assay, and inadequate number of viral copies(<138 copies/mL). A negative result must be combined with clinical observations, patient history, and epidemiological information. The expected result is Negative.  Fact Sheet for Patients:  EntrepreneurPulse.com.au  Fact Sheet for Healthcare Providers:  IncredibleEmployment.be  This test is no t yet approved or cleared by the [place] FDA and  has been authorized for detection and/or diagnosis of SARS-CoV-2 by FDA under an Emergency Use Authorization (EUA). This EUA will remain  in effect (meaning this test can be used) for the duration of the COVID-19 declaration under Section 564(b)(1) of the Act, 21 U.S.C.section 360bbb-3(b)(1), unless the authorization is terminated  or revoked sooner.       Influenza A by PCR POSITIVE (A)  NEGATIVE Final   Influenza B by PCR NEGATIVE NEGATIVE Final    Comment: (NOTE) The Xpert Xpress SARS-CoV-2/FLU/RSV plus assay is intended as an aid in the diagnosis of influenza from Nasopharyngeal swab specimens and should not be used as a sole basis for treatment. Nasal washings and aspirates are unacceptable for Xpert Xpress SARS-CoV-2/FLU/RSV testing.  Fact Sheet for Patients: EntrepreneurPulse.com.au  Fact Sheet for Healthcare Providers: IncredibleEmployment.be  This test is not yet approved or cleared by the [place] FDA and has been authorized for detection and/or diagnosis of SARS-CoV-2 by FDA under an Emergency Use Authorization (EUA). This EUA will remain in effect (meaning this test can be used) for the duration of the COVID-19 declaration under Section 564(b)(1) of the Act, 21 U.S.C. section 360bbb-3(b)(1), unless the authorization is terminated or revoked.  Performed at [facility], [place] [address] [place], [place] [address]     Labs: CBC: Recent Labs  Lab 03/23/22 1047 03/23/22 1341 03/24/22 0728 03/25/22 0550  WBC 10.3 11.7* 12.6* 9.8  NEUTROABS 6.8 7.4  --   --   HGB 14.9 15.5* 13.4 12.2  HCT 44.6 46.7* 40.9 37.5  MCV 87.8 87.6 88.5 91.0  PLT 384.0 287 362 [id number]   Basic Metabolic Panel: Recent Labs  Lab 03/23/22 1047 03/23/22 2059 03/24/22 0546 03/25/22 0550  NA 137 134* 136 140  K 3.9 3.7 4.0 3.7  CL 103 101 106 112*  CO2 25 22 21* 22  GLUCOSE 92 159* 142* 93  BUN 18 18 18  22*  CREATININE 0.71 0.77 0.64 0.85  CALCIUM 10.0 9.2 9.0 8.1*  MG  --   --  2.2  --    Liver Function Tests: Recent Labs  Lab 03/23/22 1047 03/23/22 2059  AST 16 24  ALT 14 21  ALKPHOS 61 54  BILITOT 0.5 0.7  PROT 7.6 7.7  ALBUMIN 4.5 4.7   CBG: Recent Labs  Lab 03/25/22 0729 03/25/22 1150 03/25/22 1707 03/25/22 2045 03/26/22 0737  GLUCAP 84 88 166* 148* 73    Discharge time spent: greater than 30  minutes.  Signed: Estill Cotta, MD [facility] 03/26/2022

## 2022-03-30 ENCOUNTER — Encounter: Payer: Self-pay | Admitting: Gastroenterology

## 2022-04-06 ENCOUNTER — Encounter: Payer: Self-pay | Admitting: Gastroenterology

## 2022-04-06 ENCOUNTER — Ambulatory Visit (AMBULATORY_SURGERY_CENTER): Payer: 59 | Admitting: *Deleted

## 2022-04-06 VITALS — Ht 63.0 in | Wt 160.0 lb

## 2022-04-06 DIAGNOSIS — Z1211 Encounter for screening for malignant neoplasm of colon: Secondary | ICD-10-CM

## 2022-04-06 MED ORDER — NA SULFATE-K SULFATE-MG SULF 17.5-3.13-1.6 GM/177ML PO SOLN: 1.0000 | Freq: Once | ORAL | 0 refills | Status: AC

## 2022-04-06 NOTE — Progress Notes (Signed)
No egg or soy allergy known to patient  No issues known to pt with past sedation with any surgeries or procedures Patient denies ever being told they had issues or difficulty with intubation  No FH of Malignant Hyperthermia Pt is not on diet pills Pt is not on  home 02  Pt is not on blood thinners  Pt denies issues with constipation  No A fib or A flutter Have any cardiac testing pending--no Pt instructed to use Singlecare.com or GoodRx for a price reduction on prep   

## 2022-04-12 ENCOUNTER — Encounter: Payer: Self-pay | Admitting: Internal Medicine

## 2022-04-12 ENCOUNTER — Ambulatory Visit: Payer: 59 | Admitting: Internal Medicine

## 2022-04-12 VITALS — BP 116/66 | HR 83 | Temp 97.8°F | Ht 63.0 in | Wt 165.0 lb

## 2022-04-12 DIAGNOSIS — R7303 Prediabetes: Secondary | ICD-10-CM | POA: Diagnosis not present

## 2022-04-12 DIAGNOSIS — Z23 Encounter for immunization: Secondary | ICD-10-CM | POA: Diagnosis not present

## 2022-04-12 DIAGNOSIS — E785 Hyperlipidemia, unspecified: Secondary | ICD-10-CM | POA: Diagnosis not present

## 2022-04-12 DIAGNOSIS — Z0000 Encounter for general adult medical examination without abnormal findings: Secondary | ICD-10-CM

## 2022-04-12 DIAGNOSIS — Z124 Encounter for screening for malignant neoplasm of cervix: Secondary | ICD-10-CM

## 2022-04-12 DIAGNOSIS — Z1231 Encounter for screening mammogram for malignant neoplasm of breast: Secondary | ICD-10-CM

## 2022-04-12 DIAGNOSIS — Z0001 Encounter for general adult medical examination with abnormal findings: Secondary | ICD-10-CM

## 2022-04-12 LAB — LIPID PANEL
Cholesterol: 259 mg/dL — ABNORMAL HIGH (ref 0–200)
HDL: 71.1 mg/dL (ref >39.00)
LDL Cholesterol: 160 mg/dL — ABNORMAL HIGH (ref 0–99)
NonHDL: 188.26
Total CHOL/HDL Ratio: 4
Triglycerides: 139 mg/dL (ref 0.0–149.0)
VLDL: 27.8 mg/dL (ref 0.0–40.0)

## 2022-04-12 LAB — TSH: TSH: 1.12 u[IU]/mL (ref 0.35–5.50)

## 2022-04-12 NOTE — Patient Instructions (Signed)
I recommend a Shingles vaccine booster in 2-6 months   Health Maintenance, Female Adopting a healthy lifestyle and getting preventive care are important in promoting health and wellness. Ask your health care provider about: The right schedule for you to have regular tests and exams. Things you can do on your own to prevent diseases and keep yourself healthy. What should I know about diet, weight, and exercise? Eat a healthy diet  Eat a diet that includes plenty of vegetables, fruits, low-fat dairy products, and lean protein. Do not eat a lot of foods that are high in solid fats, added sugars, or sodium. Maintain a healthy weight Body mass index (BMI) is used to identify weight problems. It estimates body fat based on height and weight. Your health care provider can help determine your BMI and help you achieve or maintain a healthy weight. Get regular exercise Get regular exercise. This is one of the most important things you can do for your health. Most adults should: Exercise for at least 150 minutes each week. The exercise should increase your heart rate and make you sweat (moderate-intensity exercise). Do strengthening exercises at least twice a week. This is in addition to the moderate-intensity exercise. Spend less time sitting. Even light physical activity can be beneficial. Watch cholesterol and blood lipids Have your blood tested for lipids and cholesterol at 56 years of age, then have this test every 5 years. Have your cholesterol levels checked more often if: Your lipid or cholesterol levels are high. You are older than 56 years of age. You are at high risk for heart disease. What should I know about cancer screening? Depending on your health history and family history, you may need to have cancer screening at various ages. This may include screening for: Breast cancer. Cervical cancer. Colorectal cancer. Skin cancer. Lung cancer. What should I know about heart disease,  diabetes, and high blood pressure? Blood pressure and heart disease High blood pressure causes heart disease and increases the risk of stroke. This is more likely to develop in people who have high blood pressure readings or are overweight. Have your blood pressure checked: Every 3-5 years if you are 63-32 years of age. Every year if you are 15 years old or older. Diabetes Have regular diabetes screenings. This checks your fasting blood sugar level. Have the screening done: Once every three years after age 19 if you are at a normal weight and have a low risk for diabetes. More often and at a younger age if you are overweight or have a high risk for diabetes. What should I know about preventing infection? Hepatitis B If you have a higher risk for hepatitis B, you should be screened for this virus. Talk with your health care provider to find out if you are at risk for hepatitis B infection. Hepatitis C Testing is recommended for: Everyone born from [birth year] through 1964. Anyone with known risk factors for hepatitis C. Sexually transmitted infections (STIs) Get screened for STIs, including gonorrhea and chlamydia, if: You are sexually active and are younger than 56 years of age. You are older than 56 years of age and your health care provider tells you that you are at risk for this type of infection. Your sexual activity has changed since you were last screened, and you are at increased risk for chlamydia or gonorrhea. Ask your health care provider if you are at risk. Ask your health care provider about whether you are at high risk for HIV. Your health  care provider may recommend a prescription medicine to help prevent HIV infection. If you choose to take medicine to prevent HIV, you should first get tested for HIV. You should then be tested every 3 months for as long as you are taking the medicine. Pregnancy If you are about to stop having your period (premenopausal) and you may become pregnant,  seek counseling before you get pregnant. Take 400 to 800 micrograms (mcg) of folic acid every day if you become pregnant. Ask for birth control (contraception) if you want to prevent pregnancy. Osteoporosis and menopause Osteoporosis is a disease in which the bones lose minerals and strength with aging. This can result in bone fractures. If you are 59 years old or older, or if you are at risk for osteoporosis and fractures, ask your health care provider if you should: Be screened for bone loss. Take a calcium or vitamin D supplement to lower your risk of fractures. Be given hormone replacement therapy (HRT) to treat symptoms of menopause. Follow these instructions at home: Alcohol use Do not drink alcohol if: Your health care provider tells you not to drink. You are pregnant, may be pregnant, or are planning to become pregnant. If you drink alcohol: Limit how much you have to: 0-1 drink a day. Know how much alcohol is in your drink. In the [place] one drink equals one 12 oz bottle of beer (355 mL), one 5 oz glass of wine (148 mL), or one 1 oz glass of hard liquor (44 mL). Lifestyle Do not use any products that contain nicotine or tobacco. These products include cigarettes, chewing tobacco, and vaping devices, such as e-cigarettes. If you need help quitting, ask your health care provider. Do not use street drugs. Do not share needles. Ask your health care provider for help if you need support or information about quitting drugs. General instructions Schedule regular health, dental, and eye exams. Stay current with your vaccines. Tell your health care provider if: You often feel depressed. You have ever been abused or do not feel safe at home. Summary Adopting a healthy lifestyle and getting preventive care are important in promoting health and wellness. Follow your health care provider's instructions about healthy diet, exercising, and getting tested or screened for diseases. Follow your  health care provider's instructions on monitoring your cholesterol and blood pressure. This information is not intended to replace advice given to you by your health care provider. Make sure you discuss any questions you have with your health care provider. Document Revised: 05/11/2020 Document Reviewed: 05/11/2020 [employer] Patient Education  [place].

## 2022-04-12 NOTE — Progress Notes (Signed)
Subjective:  Patient ID: Jill Glenn, female    DOB: 1966-01-03  Age: 57 y.o. MRN: [id number]  CC: Annual Exam   HPI [name] presents for a CPX and f/up ---  She has had no dizziness or vertigo for 2 days.  She continues to complain of nonproductive cough and shortness of breath.  She is getting symptom relief with Breztri and albuterol.  Patient: Jill Glenn MRN: [id number] DOB: [birth year]  Admit date:     03/23/2022  Discharge date: 03/26/22  Discharge Physician: Thad Ranger, MD     PCP: Etta Grandchild, MD    Recommendations at discharge:    Recommended outpatient vestibular rehab for BPPV Started on buspirone 5 mg daily for anxiety, can be increased if tolerating dose Started on Flexeril 5 mg twice daily as needed for C spine DJD, muscle spasms.  Needs outpatient neurosurgery follow-up. Recommended outpatient follow-up with neurology for migraine headaches, ambulatory referral sent   Discharge Diagnoses:     BPPV (benign paroxysmal positional vertigo), bilateral  Anxiety  COPD, very severe (HCC) with exacerbation   Dizziness Acute migraine   Tobacco use disorder   Panlobular emphysema (HCC)   COPD, very severe (HCC) with exacerbation   Dizziness     Hospital Course:   Patient is a 57 year old female with COPD, ongoing tobacco use, presented with persistent dizziness and unsteady gait.  Patient reported that she has been having persistent dizziness for last 1 week with unsteady gait and unable to walk straight.  She has been leaning to the right.  Denied any focal weakness, headaches, nausea or vomiting.  Also reported room spinning around her otherwise no tinnitus.  No new medications or change in medications. Patient had MRI brain, CTA head and neck, negative for acute CVA Seen by neurology, admitted for further workup.  Outpatient Medications Prior to Visit  Medication Sig Dispense Refill   acetaminophen (TYLENOL) 325 MG tablet Take 2  tablets (650 mg total) by mouth every 6 (six) hours as needed for moderate pain, mild pain or headache.     albuterol (VENTOLIN HFA) 108 (90 Base) MCG/ACT inhaler Inhale 2 puffs into the lungs every 6 (six) hours as needed for wheezing or shortness of breath. 18 g 3   Ascorbic Acid (VITAMIN C GUMMIES PO) Take 500 mg by mouth daily.     Budeson-Glycopyrrol-Formoterol (BREZTRI AEROSPHERE) 160-9-4.8 MCG/ACT AERO Inhale 2 puffs into the lungs in the morning and at bedtime. 1 each 5   busPIRone (BUSPAR) 5 MG tablet Take 1 tablet (5 mg total) by mouth in the morning. 30 tablet 0   Cranberry 500 MG TABS Take 500 mg by mouth daily as needed (for UTI prevention).     cyclobenzaprine (FLEXERIL) 5 MG tablet Take 1 tablet (5 mg total) by mouth 2 (two) times daily as needed for muscle spasms. 30 tablet 0   Multiple Vitamin (MULTIVITAMIN ADULT) TABS Take 1 tablet by mouth daily with breakfast.     NICORETTE 2 MG lozenge Take 2 mg by mouth as needed for smoking cessation.     rizatriptan (MAXALT) 10 MG tablet Take 1 tablet (10 mg total) by mouth once as needed for migraine. May repeat in 2 hours if needed 30 tablet 2   meclizine (ANTIVERT) 25 MG tablet Take 1 tablet (25 mg total) by mouth 3 (three) times daily as needed for dizziness. Also available OTC 60 tablet 0   ondansetron (ZOFRAN) 4 MG tablet Take 1 tablet (4  mg total) by mouth every 6 (six) hours as needed for nausea or vomiting. 20 tablet 0   TYLENOL PM EXTRA STRENGTH 500-25 MG TABS tablet Take 2 tablets by mouth at bedtime as needed (for sleep).     No facility-administered medications prior to visit.    ROS Review of Systems  Constitutional: Negative.  Negative for diaphoresis, fatigue and unexpected weight change.  HENT: Negative.    Eyes: Negative.   Respiratory:  Positive for cough and shortness of breath. Negative for chest tightness and wheezing.   Cardiovascular:  Negative for chest pain, palpitations and leg swelling.  Gastrointestinal:   Negative for abdominal pain, diarrhea, nausea and vomiting.  Endocrine: Negative.   Genitourinary: Negative.  Negative for difficulty urinating.  Musculoskeletal:  Positive for neck pain. Negative for arthralgias and myalgias.  Skin: Negative.  Negative for color change and pallor.  Neurological: Negative.  Negative for dizziness and weakness.  Hematological:  Negative for adenopathy. Does not bruise/bleed easily.  Psychiatric/Behavioral: Negative.      Objective:  BP 116/66 (BP Location: Right Arm, Patient Position: Sitting, Cuff Size: Large)   Pulse 83   Temp 97.8 F (36.6 C) (Oral)   Ht 5\' 3"  (1.6 m)   Wt 165 lb (74.8 kg)   SpO2 92%   BMI 29.23 kg/m   BP Readings from Last 3 Encounters:  04/12/22 116/66  03/26/22 (!) 133/97  03/23/22 118/78    Wt Readings from Last 3 Encounters:  04/12/22 165 lb (74.8 kg)  04/06/22 160 lb (72.6 kg)  03/23/22 163 lb (73.9 kg)    Physical Exam Vitals reviewed.  Constitutional:      Appearance: She is not ill-appearing.  HENT:     Nose: Nose normal.     Mouth/Throat:     Mouth: Mucous membranes are moist.  Eyes:     General: No scleral icterus.    Conjunctiva/sclera: Conjunctivae normal.  Cardiovascular:     Rate and Rhythm: Normal rate and regular rhythm.     Heart sounds: No murmur heard.    No friction rub. No gallop.  Pulmonary:     Effort: Pulmonary effort is normal.     Breath sounds: No stridor. No wheezing, rhonchi or rales.  Abdominal:     General: Abdomen is flat.     Palpations: There is no mass.     Tenderness: There is no abdominal tenderness. There is no guarding.     Hernia: No hernia is present.  Musculoskeletal:        General: Normal range of motion.     Cervical back: Neck supple.     Right lower leg: No edema.     Left lower leg: No edema.  Lymphadenopathy:     Cervical: No cervical adenopathy.  Skin:    General: Skin is warm and dry.     Coloration: Skin is not pale.  Neurological:     General:  No focal deficit present.     Mental Status: She is alert. Mental status is at baseline.  Psychiatric:        Mood and Affect: Mood normal.        Behavior: Behavior normal.     Lab Results  Component Value Date   WBC 9.8 03/25/2022   HGB 12.2 03/25/2022   HCT 37.5 03/25/2022   PLT 298 03/25/2022   GLUCOSE 93 03/25/2022   CHOL 259 (H) 04/12/2022   TRIG 139.0 04/12/2022   HDL 71.10 04/12/2022   LDLCALC  160 (H) 04/12/2022   ALT 21 03/23/2022   AST 24 03/23/2022   NA 140 03/25/2022   K 3.7 03/25/2022   CL 112 (H) 03/25/2022   CREATININE 0.85 03/25/2022   BUN 22 (H) 03/25/2022   CO2 22 03/25/2022   TSH 1.12 04/12/2022   INR 1.0 03/23/2022   HGBA1C 6.0 (H) 03/24/2022    DG CHEST PORT 1 VIEW  Result Date: 03/24/2022 CLINICAL DATA:  Dyspnea.  Chest pain and shortness of breath EXAM: PORTABLE CHEST 1 VIEW COMPARISON:  03/23/2022 FINDINGS: Numerous leads and wires project over the chest. Midline trachea. Normal heart size. No pleural effusion or pneumothorax. Lucency in the upper lungs consistent with emphysema. Mild biapical scarring. No lobar consolidation. IMPRESSION: 1. No acute cardiopulmonary disease. 2. Emphysema. Electronically Signed   By: Jeronimo Greaves M.D.   On: 03/24/2022 17:33   MR Cervical Spine Wo Contrast  Result Date: 03/24/2022 CLINICAL DATA:  Ataxia, nontraumatic, cervical pathology suspected. EXAM: MRI CERVICAL SPINE WITHOUT CONTRAST TECHNIQUE: Multiplanar, multisequence MR imaging of the cervical spine was performed. No intravenous contrast was administered. COMPARISON:  CTA head/neck 03/23/2022. FINDINGS: Alignment: 3 mm retrolisthesis of C5 on C6. Vertebrae: Normal vertebral body heights and marrow signal. Cord: Normal spinal cord signal and volume. Posterior Fossa, vertebral arteries, paraspinal tissues: Unremarkable. Disc levels: C2-C3: Left-sided facet arthropathy and uncovertebral joint spurring results in moderate left neural foraminal narrowing. C3-C4: No disc  herniation, spinal canal stenosis or neural foraminal narrowing. Mild bilateral facet arthropathy. C4-C5:  Unremarkable. C5-C6: Retrolisthesis, disc bulge and uncovertebral joint spurring results in mild spinal canal stenosis and severe bilateral neural foraminal narrowing. C6-C7: No disc herniation or spinal canal stenosis. Moderate disc height loss, left-sided facet arthropathy and uncovertebral joint spurring results in severe left neural foraminal narrowing. C7-T1:  Normal. IMPRESSION: 1. Multilevel cervical spondylosis, worst at C5-6, where there is mild spinal canal stenosis and severe bilateral neural foraminal narrowing. 2. Severe left neural foraminal narrowing at C6-C7. 3. Moderate left neural foraminal narrowing at C2-C3. Electronically Signed   By: Orvan Falconer M.D.   On: 03/24/2022 09:22   MR BRAIN WO CONTRAST  Result Date: 03/23/2022 CLINICAL DATA:  Initial evaluation for vertigo. EXAM: MRI HEAD WITHOUT CONTRAST TECHNIQUE: Multiplanar, multiecho pulse sequences of the brain and surrounding structures were obtained without intravenous contrast. COMPARISON:  Prior CT from earlier the same day. FINDINGS: Brain: Cerebral volume within normal limits for age. No focal parenchymal signal abnormality. No abnormal foci of restricted diffusion to suggest acute or subacute ischemia. Gray-white matter differentiation well maintained. No encephalomalacia to suggest chronic cortical infarction or other insult. No foci of susceptibility artifact indicative of acute or chronic intracranial blood products. No mass lesion, midline shift or mass effect. Ventricles normal in size and morphology without hydrocephalus. No extra-axial fluid collection. Pituitary gland and suprasellar region within normal limits. Vascular: Major intracranial vascular flow voids are well maintained. Skull and upper cervical spine: Craniocervical junction within normal limits. Visualized upper cervical spine demonstrates no significant  finding. Bone marrow signal intensity within normal limits. No scalp soft tissue abnormality. Sinuses/Orbits: Globes and orbital soft tissues are within normal limits. Paranasal sinuses are largely clear. No significant mastoid effusion. Other: None. IMPRESSION: Normal brain MRI. No acute intracranial abnormality identified. Electronically Signed   By: Rise Mu M.D.   On: 03/23/2022 20:40   CT Angio Head W or Wo Contrast  Result Date: 03/23/2022 CLINICAL DATA:  Dizziness, history of migraine EXAM: CT ANGIOGRAPHY HEAD AND NECK TECHNIQUE:  Multidetector CT imaging of the head and neck was performed using the standard protocol during bolus administration of intravenous contrast. Multiplanar CT image reconstructions and MIPs were obtained to evaluate the vascular anatomy. Carotid stenosis measurements (when applicable) are obtained utilizing NASCET criteria, using the distal internal carotid diameter as the denominator. RADIATION DOSE REDUCTION: This exam was performed according to the departmental dose-optimization program which includes automated exposure control, adjustment of the mA and/or kV according to patient size and/or use of iterative reconstruction technique. CONTRAST:  75mL OMNIPAQUE IOHEXOL 350 MG/ML SOLN COMPARISON:  None Available. FINDINGS: CT HEAD FINDINGS Brain: No evidence of acute infarct, hemorrhage, mass, mass effect, or midline shift. No hydrocephalus or extra-axial fluid collection. Vascular: No hyperdense vessel. Skull: Negative for fracture or focal lesion. Sinuses/Orbits: No acute finding. Other: None. CTA NECK FINDINGS Aortic arch: Standard branching. Imaged portion shows no evidence of aneurysm or dissection. No significant stenosis of the major arch vessel origins. Right carotid system: Possible small filling defect in the proximal right CCA (series 11, image 275), although this may be artifactual. No hemodynamically significant stenosis or occlusion. Left carotid system: No  evidence of stenosis, dissection, or occlusion. Vertebral arteries: No evidence of stenosis, dissection, or occlusion. Skeleton: No acute osseous abnormality. Degenerative changes in the cervical spine. Other neck: 7 mm lesion with calcification in the right thyroid lobe, for which no follow-up is currently indicated. (Reference: J Am Coll Radiol. 2015 Feb;12(2): 143-50). Upper chest: Severe centrilobular and paraseptal emphysema. No focal pulmonary opacity or pleural effusion. Review of the MIP images confirms the above findings CTA HEAD FINDINGS Anterior circulation: Both internal carotid arteries are patent to the termini, without significant stenosis. A1 segments patent. Normal anterior communicating artery. Anterior cerebral arteries are patent to their distal aspects. No M1 stenosis or occlusion. MCA branches perfused and symmetric. Posterior circulation: Vertebral arteries patent to the vertebrobasilar junction without stenosis. Posterior inferior cerebellar arteries patent proximally. Basilar patent to its distal aspect. Superior cerebellar arteries patent proximally. Patent P1 segments. PCAs perfused to their distal aspects without stenosis. The bilateral posterior communicating arteries are not visualized. Venous sinuses: As permitted by contrast timing, patent. Anatomic variants: None significant. Review of the MIP images confirms the above findings IMPRESSION: 1. No acute intracranial process. 2. No intracranial large vessel occlusion or significant stenosis. 3. No hemodynamically significant stenosis in the neck. 4. In the proximal right CCA, there is a possible filling defect, which could represent a small dissection flap, although this is more likely artifact. Consider short interval follow-up. 5. Severe emphysema. Emphysema (ICD10-J43.9). Electronically Signed   By: Wiliam Ke M.D.   On: 03/23/2022 19:18   CT Angio Neck W and/or Wo Contrast  Result Date: 03/23/2022 CLINICAL DATA:  Dizziness,  history of migraine EXAM: CT ANGIOGRAPHY HEAD AND NECK TECHNIQUE: Multidetector CT imaging of the head and neck was performed using the standard protocol during bolus administration of intravenous contrast. Multiplanar CT image reconstructions and MIPs were obtained to evaluate the vascular anatomy. Carotid stenosis measurements (when applicable) are obtained utilizing NASCET criteria, using the distal internal carotid diameter as the denominator. RADIATION DOSE REDUCTION: This exam was performed according to the departmental dose-optimization program which includes automated exposure control, adjustment of the mA and/or kV according to patient size and/or use of iterative reconstruction technique. CONTRAST:  75mL OMNIPAQUE IOHEXOL 350 MG/ML SOLN COMPARISON:  None Available. FINDINGS: CT HEAD FINDINGS Brain: No evidence of acute infarct, hemorrhage, mass, mass effect, or midline shift. No hydrocephalus or extra-axial  fluid collection. Vascular: No hyperdense vessel. Skull: Negative for fracture or focal lesion. Sinuses/Orbits: No acute finding. Other: None. CTA NECK FINDINGS Aortic arch: Standard branching. Imaged portion shows no evidence of aneurysm or dissection. No significant stenosis of the major arch vessel origins. Right carotid system: Possible small filling defect in the proximal right CCA (series 11, image 275), although this may be artifactual. No hemodynamically significant stenosis or occlusion. Left carotid system: No evidence of stenosis, dissection, or occlusion. Vertebral arteries: No evidence of stenosis, dissection, or occlusion. Skeleton: No acute osseous abnormality. Degenerative changes in the cervical spine. Other neck: 7 mm lesion with calcification in the right thyroid lobe, for which no follow-up is currently indicated. (Reference: J Am Coll Radiol. 2015 Feb;12(2): 143-50). Upper chest: Severe centrilobular and paraseptal emphysema. No focal pulmonary opacity or pleural effusion. Review of  the MIP images confirms the above findings CTA HEAD FINDINGS Anterior circulation: Both internal carotid arteries are patent to the termini, without significant stenosis. A1 segments patent. Normal anterior communicating artery. Anterior cerebral arteries are patent to their distal aspects. No M1 stenosis or occlusion. MCA branches perfused and symmetric. Posterior circulation: Vertebral arteries patent to the vertebrobasilar junction without stenosis. Posterior inferior cerebellar arteries patent proximally. Basilar patent to its distal aspect. Superior cerebellar arteries patent proximally. Patent P1 segments. PCAs perfused to their distal aspects without stenosis. The bilateral posterior communicating arteries are not visualized. Venous sinuses: As permitted by contrast timing, patent. Anatomic variants: None significant. Review of the MIP images confirms the above findings IMPRESSION: 1. No acute intracranial process. 2. No intracranial large vessel occlusion or significant stenosis. 3. No hemodynamically significant stenosis in the neck. 4. In the proximal right CCA, there is a possible filling defect, which could represent a small dissection flap, although this is more likely artifact. Consider short interval follow-up. 5. Severe emphysema. Emphysema (ICD10-J43.9). Electronically Signed   By: Wiliam KeAlison  Vasan M.D.   On: 03/23/2022 19:18   DG Chest 2 View  Result Date: 03/23/2022 CLINICAL DATA:  Cough, shortness of breath, chest pain, dizziness, and nausea since 03/17/2022, history COPD EXAM: CHEST - 2 VIEW COMPARISON:  12/21/2020 FINDINGS: Normal heart size, mediastinal contours, and pulmonary vascularity. Emphysematous and bronchitic changes consistent with COPD. Minimal biapical scarring. No acute infiltrate, pleural effusion, or pneumothorax. Osseous structures unremarkable. IMPRESSION: COPD changes with minimal biapical scarring. No acute abnormalities. Emphysema (ICD10-J43.9). Electronically Signed   By:  Ulyses SouthwardMark  Boles M.D.   On: 03/23/2022 11:12    Assessment & Plan:   Prediabetes- Will continue to monitor.  She is working on her lifestyle modifications.  Encounter for general adult medical examination with abnormal findings- Exam completed, labs reviewed, vaccines reviewed, cancer screenings addressed, patient education was given.  Dyslipidemia, goal LDL below 130- Statin is not indicated. -     Lipid panel; Future -     TSH; Future  Visit for screening mammogram -     3D Screening Mammogram, Left and Right; Future  Cervical cancer screening -     Ambulatory referral to Gynecology  Other orders -     Varicella-zoster vaccine IM     Follow-up: Return in about 6 months (around 10/12/2022).  Sanda Lingerhomas Vinicius Brockman, MD

## 2022-04-21 ENCOUNTER — Ambulatory Visit (AMBULATORY_SURGERY_CENTER): Payer: 59 | Admitting: Gastroenterology

## 2022-04-21 ENCOUNTER — Encounter: Payer: Self-pay | Admitting: Gastroenterology

## 2022-04-21 VITALS — BP 119/86 | HR 67 | Temp 98.4°F | Resp 16 | Ht 63.0 in | Wt 160.0 lb

## 2022-04-21 DIAGNOSIS — Z1211 Encounter for screening for malignant neoplasm of colon: Secondary | ICD-10-CM | POA: Diagnosis present

## 2022-04-21 MED ORDER — SODIUM CHLORIDE 0.9 % IV SOLN: 500.0000 mL | Freq: Once | INTRAVENOUS | Status: DC

## 2022-04-21 NOTE — Op Note (Signed)
[facility] Patient Name: Jill Glenn Procedure Date: 04/21/2022 9:42 AM MRN: [id number] Endoscopist: Sherilyn Cooter L. Myrtie Neither , MD, [id number] Age: 57 Referring MD:  Date of Birth: 1965/02/05 Gender: Female Account #: [id number] Procedure:                Colonoscopy Indications:              Screening for colorectal malignant neoplasm, This                            is the patient's first colonoscopy Medicines:                Monitored Anesthesia Care Procedure:                Pre-Anesthesia Assessment:                           - Prior to the procedure, a History and Physical                            was performed, and patient medications and                            allergies were reviewed. The patient's tolerance of                            previous anesthesia was also reviewed. The risks                            and benefits of the procedure and the sedation                            options and risks were discussed with the patient.                            All questions were answered, and informed consent                            was obtained. Prior Anticoagulants: The patient has                            taken no anticoagulant or antiplatelet agents. ASA                            Grade Assessment: II - A patient with mild systemic                            disease. After reviewing the risks and benefits,                            the patient was deemed in satisfactory condition to                            undergo the procedure.  After obtaining informed consent, the colonoscope                            was passed under direct vision. Throughout the                            procedure, the patient's blood pressure, pulse, and                            oxygen saturations were monitored continuously. The                            CF [id number] #[id number] was introduced through the anus                            and advanced to  the the cecum, identified by                            appendiceal orifice and ileocecal valve. The                            colonoscopy was performed without difficulty. The                            patient tolerated the procedure well. The quality                            of the bowel preparation was good after lavage. The                            ileocecal valve, appendiceal orifice, and rectum                            were photographed. The bowel preparation used was                            SUPREP via split dose instruction. Scope In: 9:50:42 AM Scope Out: 10:07:57 AM Scope Withdrawal Time: 0 hours 13 minutes 40 seconds  Total Procedure Duration: 0 hours 17 minutes 15 seconds  Findings:                 The perianal and digital rectal examinations were                            normal.                           Repeat examination of right colon under NBI                            performed.                           Internal hemorrhoids were found. The hemorrhoids  were small.                           The GI Genius (intelligent endoscopy module)                            computer-aided polyp detection system powered by AI                            was utilized to detect colorectal polyps through                            enhanced visualization.                           The exam was otherwise without abnormality on                            direct and retroflexion views. Complications:            No immediate complications. Estimated Blood Loss:     Estimated blood loss: none. Impression:               - Internal hemorrhoids.                           - The examination was otherwise normal on direct                            and retroflexion views.                           - No specimens collected. Recommendation:           - Patient has a contact number available for                            emergencies. The signs and symptoms of  potential                            delayed complications were discussed with the                            patient. Return to normal activities tomorrow.                            Written discharge instructions were provided to the                            patient.                           - Resume previous diet.                           - Continue present medications.                           -  Repeat colonoscopy in 10 years for screening                            purposes. (Consume more water with prep for next                            exam) Sherilyn Cooter L. Myrtie Neither, MD 04/21/2022 10:10:37 AM This report has been signed electronically.

## 2022-04-21 NOTE — Patient Instructions (Signed)
Please read handouts provided. Continue present medications. Repeat colonoscopy in 10 years for screening.   YOU HAD AN ENDOSCOPIC PROCEDURE TODAY AT THE [facility]:   Refer to the procedure report that was given to you for any specific questions about what was found during the examination.  If the procedure report does not answer your questions, please call your gastroenterologist to clarify.  If you requested that your care partner not be given the details of your procedure findings, then the procedure report has been included in a sealed envelope for you to review at your convenience later.  YOU SHOULD EXPECT: Some feelings of bloating in the abdomen. Passage of more gas than usual.  Walking can help get rid of the air that was put into your GI tract during the procedure and reduce the bloating. If you had a lower endoscopy (such as a colonoscopy or flexible sigmoidoscopy) you may notice spotting of blood in your stool or on the toilet paper. If you underwent a bowel prep for your procedure, you may not have a normal bowel movement for a few days.  Please Note:  You might notice some irritation and congestion in your nose or some drainage.  This is from the oxygen used during your procedure.  There is no need for concern and it should clear up in a day or so.  SYMPTOMS TO REPORT IMMEDIATELY:  Following lower endoscopy (colonoscopy or flexible sigmoidoscopy):  Excessive amounts of blood in the stool  Significant tenderness or worsening of abdominal pains  Swelling of the abdomen that is new, acute  Fever of 100F or higher.  For urgent or emergent issues, a gastroenterologist can be reached at any hour by calling [phone]. Do not use MyChart messaging for urgent concerns.    DIET:  We do recommend a small meal at first, but then you may proceed to your regular diet.  Drink plenty of fluids but you should avoid alcoholic beverages for 24 hours.  ACTIVITY:  You should  plan to take it easy for the rest of today and you should NOT DRIVE or use heavy machinery until tomorrow (because of the sedation medicines used during the test).    FOLLOW UP: Our staff will call the number listed on your records the next business day following your procedure.  We will call around 7:15- 8:00 am to check on you and address any questions or concerns that you may have regarding the information given to you following your procedure. If we do not reach you, we will leave a message.     If any biopsies were taken you will be contacted by phone or by letter within the next 1-3 weeks.  Please call us at [phone] if you have not heard about the biopsies in 3 weeks.    SIGNATURES/CONFIDENTIALITY: You and/or your care partner have signed paperwork which will be entered into your electronic medical record.  These signatures attest to the fact that that the information above on your After Visit Summary has been reviewed and is understood.  Full responsibility of the confidentiality of this discharge information lies with you and/or your care-partner. 

## 2022-04-21 NOTE — Progress Notes (Signed)
History and Physical:  This patient presents for endoscopic testing for: Encounter Diagnosis  Name Primary?   Special screening for malignant neoplasms, colon Yes    Average risk for colorectal cancer.  First screening exam. Patient denies chronic abdominal pain, rectal bleeding, constipation or diarrhea.   Patient is otherwise without complaints or active issues today.   Past Medical History: Past Medical History:  Diagnosis Date   Allergy    seasonal   Anxiety    Asthma    COPD (chronic obstructive pulmonary disease)    Emphysema of lung      Past Surgical History: Past Surgical History:  Procedure Laterality Date   ABDOMINAL HYSTERECTOMY     BREAST EXCISIONAL BIOPSY     moles     mx 2 removed   TONSILLECTOMY      Allergies: Allergies  Allergen Reactions   Codeine Nausea And Vomiting, Palpitations and Other (See Comments)    Cold sweats, also    Outpatient Meds: Current Outpatient Medications  Medication Sig Dispense Refill   albuterol (VENTOLIN HFA) 108 (90 Base) MCG/ACT inhaler Inhale 2 puffs into the lungs every 6 (six) hours as needed for wheezing or shortness of breath. 18 g 3   Ascorbic Acid (VITAMIN C GUMMIES PO) Take 500 mg by mouth daily.     Budeson-Glycopyrrol-Formoterol (BREZTRI AEROSPHERE) 160-9-4.8 MCG/ACT AERO Inhale 2 puffs into the lungs in the morning and at bedtime. 1 each 5   busPIRone (BUSPAR) 5 MG tablet Take 1 tablet (5 mg total) by mouth in the morning. 30 tablet 0   Cranberry 500 MG TABS Take 500 mg by mouth daily as needed (for UTI prevention).     Multiple Vitamin (MULTIVITAMIN ADULT) TABS Take 1 tablet by mouth daily with breakfast.     acetaminophen (TYLENOL) 325 MG tablet Take 2 tablets (650 mg total) by mouth every 6 (six) hours as needed for moderate pain, mild pain or headache.     cyclobenzaprine (FLEXERIL) 5 MG tablet Take 1 tablet (5 mg total) by mouth 2 (two) times daily as needed for muscle spasms. 30 tablet 0   NICORETTE 2  MG lozenge Take 2 mg by mouth as needed for smoking cessation.     rizatriptan (MAXALT) 10 MG tablet Take 1 tablet (10 mg total) by mouth once as needed for migraine. May repeat in 2 hours if needed 30 tablet 2   Current Facility-Administered Medications  Medication Dose Route Frequency Provider Last Rate Last Admin   0.9 %  sodium chloride infusion  500 mL Intravenous Once Sherrilyn Rist, MD          ___________________________________________________________________ Objective   Exam:  BP 104/64   Pulse 70   Temp 98.4 F (36.9 C)   Ht  (1.6 m)   Wt 160 lb (72.6 kg)   SpO2 98%   BMI 28.34 kg/m   CV: regular , S1/S2 Resp: clear to auscultation bilaterally, normal RR and effort noted GI: soft, no tenderness, with active bowel sounds.   Assessment: Encounter Diagnosis  Name Primary?   Special screening for malignant neoplasms, colon Yes     Plan: Colonoscopy  The benefits and risks of the planned procedure were described in detail with the patient or (when appropriate) their health care proxy.  Risks were outlined as including, but not limited to, bleeding, infection, perforation, adverse medication reaction leading to cardiac or pulmonary decompensation, pancreatitis (if ERCP).  The limitation of incomplete mucosal visualization was also discussed.  No guarantees or warranties were given.    The patient is appropriate for an endoscopic procedure in the ambulatory setting.   - Wilfrid Lund, MD

## 2022-04-21 NOTE — Progress Notes (Signed)
Report given to PACU, vss 

## 2022-04-21 NOTE — Progress Notes (Signed)
Pt's states no medical or surgical changes since previsit or office visit. 

## 2022-04-22 ENCOUNTER — Telehealth: Payer: Self-pay | Admitting: *Deleted

## 2022-04-22 NOTE — Telephone Encounter (Signed)
Post procedure follow up phone call. No answer at number given.  Left message on voicemail.  

## 2022-04-25 ENCOUNTER — Other Ambulatory Visit: Payer: Self-pay | Admitting: Internal Medicine

## 2022-04-25 DIAGNOSIS — J4489 Other specified chronic obstructive pulmonary disease: Secondary | ICD-10-CM

## 2022-05-20 ENCOUNTER — Ambulatory Visit: Payer: 59

## 2022-06-02 ENCOUNTER — Encounter: Payer: Self-pay | Admitting: Internal Medicine

## 2022-06-02 ENCOUNTER — Ambulatory Visit: Payer: 59 | Admitting: Internal Medicine

## 2022-06-02 VITALS — BP 120/78 | HR 89 | Temp 97.8°F | Ht 63.0 in | Wt 167.0 lb

## 2022-06-02 DIAGNOSIS — J432 Centrilobular emphysema: Secondary | ICD-10-CM

## 2022-06-02 DIAGNOSIS — R0602 Shortness of breath: Secondary | ICD-10-CM | POA: Diagnosis not present

## 2022-06-02 DIAGNOSIS — Z87891 Personal history of nicotine dependence: Secondary | ICD-10-CM

## 2022-06-02 DIAGNOSIS — J301 Allergic rhinitis due to pollen: Secondary | ICD-10-CM | POA: Diagnosis not present

## 2022-06-02 DIAGNOSIS — J449 Chronic obstructive pulmonary disease, unspecified: Secondary | ICD-10-CM | POA: Diagnosis not present

## 2022-06-02 MED ORDER — LEVOCETIRIZINE DIHYDROCHLORIDE 5 MG PO TABS
5.0000 mg | ORAL_TABLET | Freq: Every evening | ORAL | 11 refills | Status: DC
Start: 2022-06-02 — End: 2023-05-22

## 2022-06-02 MED ORDER — TRELEGY ELLIPTA 200-62.5-25 MCG/ACT IN AEPB
1.0000 | INHALATION_SPRAY | Freq: Every day | RESPIRATORY_TRACT | 5 refills | Status: DC
Start: 2022-06-02 — End: 2022-09-07

## 2022-06-02 MED ORDER — MONTELUKAST SODIUM 10 MG PO TABS
10.0000 mg | ORAL_TABLET | Freq: Every day | ORAL | 11 refills | Status: DC
Start: 2022-06-02 — End: 2023-05-16

## 2022-06-02 MED ORDER — ALBUTEROL SULFATE (2.5 MG/3ML) 0.083% IN NEBU: 2.5000 mg | INHALATION_SOLUTION | Freq: Four times a day (QID) | RESPIRATORY_TRACT | 12 refills | Status: DC | PRN

## 2022-06-02 NOTE — Patient Instructions (Addendum)
Please schedule follow up scheduled with myself in 3 months.  If my schedule is not open yet, we will contact you with a reminder closer to that time. Please call [phone] if you haven't heard from [place] a month before.   Before your next visit I would like you to have: Six minute walk test - schedule in our office Echocardiogram - schedule at imaging center Blood work - TODAY  Start xyzal and Quarry manager for allergies  Start trelegy  Will get nebulizer machine with albuterol nebs at home.   I am going to refer you to [facility] for lung transplant evaluation once we have some more information. You need to start exercising regularly again. This is very important.

## 2022-06-02 NOTE — Addendum Note (Signed)
Addended by: Delrae Rend on: 06/02/2022 11:53 AM   Modules accepted: Orders

## 2022-06-02 NOTE — Progress Notes (Signed)
Jill Glenn    [id number]    December 06, 1965  Primary Care Physician:Jones, Bernadene Bell, MD Date of Appointment: 06/02/2022 Established Patient Visit  Chief complaint:   Chief Complaint  Patient presents with   Hospitalization Follow-up    Breathing worse since allergy season started and hospitalization.     HPI: Jill Glenn is a 57 y.o. woman with COPD, very severe FEV1 24% of predicted. .   Interval Updates: Here for follow up. Recent hospitalization for dizziness, found to have BPPV. This has since resolved.  Breztri worked well for her. Her insurance is not covering breztri. They do cover trelegy.  She still has some breztri left and feels ok on this.   She did all 13 weeks of pulmonary rehab, finished in feb 2024. Did really well, had dramatic improvement.  She hasn't been able to exercise because of BPPV. She wants to build back her stamina.   Was taking xyzal for allergies but ran out. Her breathing is worse this time of year. She has nasal congestion.   She lost her job. She has procured insurance on [employer].  Hasn't needed prednisone for her breathing. She is no longer coughing up as much sputum since she quit smoking.   I have reviewed the patient's family social and past medical history and updated as appropriate.   Past Medical History:  Diagnosis Date   Allergy    seasonal   Anxiety    Asthma    COPD (chronic obstructive pulmonary disease) (HCC)    Emphysema of lung (HCC)     Past Surgical History:  Procedure Laterality Date   ABDOMINAL HYSTERECTOMY     BREAST EXCISIONAL BIOPSY     moles     mx 2 removed   TONSILLECTOMY      Family History  Problem Relation Age of Onset   COPD Mother    Lung cancer Mother    Colon polyps Father    Colon cancer Neg Hx    Crohn's disease Neg Hx    Esophageal cancer Neg Hx    Rectal cancer Neg Hx    Stomach cancer Neg Hx    Ulcerative colitis Neg Hx     Social History    Occupational History   Occupation: Audiological scientist  Tobacco Use   Smoking status: Former    Packs/day: 1.00    Years: 40.00    Additional pack years: 0.00    Total pack years: 40.00    Types: Cigarettes    Start date: 03/05/1981    Quit date: 10/14/2021    Years since quitting: 0.6   Smokeless tobacco: Never  Vaping Use   Vaping Use: Never used  Substance and Sexual Activity   Alcohol use: Not Currently    Comment: rarely    Drug use: No   Sexual activity: Yes    Partners: Male     Physical Exam: Blood pressure 120/78, pulse 89, temperature 97.8 F (36.6 C), temperature source Oral, height 5\' 3"  (1.6 m), weight 167 lb (75.8 kg), SpO2 90 %.  Gen:      No acute distress, purse lipped breathing on room air Lungs:    diminished, clear CV:         Regular rate and rhythm; no murmurs, rubs, or gallops.  No pedal edema   Data Reviewed: Imaging: I have personally reviewed the CT Chest Sept 2023 with extensive emphysema  PFTs:     Latest  Ref Rng & Units 10/15/2021    8:50 AM  PFT Results  FVC-Pre L 1.58   FVC-Predicted Pre % 47   FVC-Post L 1.92   FVC-Predicted Post % 58   Pre FEV1/FVC % % 40   Post FEV1/FCV % % 41   FEV1-Pre L 0.64   FEV1-Predicted Pre % 24   FEV1-Post L 0.79   DLCO uncorrected ml/min/mmHg 11.71   DLCO UNC% % 58   DLCO corrected ml/min/mmHg 11.71   DLCO COR %Predicted % 58   DLVA Predicted % 67   TLC L 6.59   TLC % Predicted % 134   RV % Predicted % 253    I have personally reviewed the patient's PFTs and very severe COPD FEv1  Labs:  Immunization status: Immunization History  Administered Date(s) Administered   Influenza,inj,Quad PF,6+ Mos 12/21/2020, 10/14/2021   PNEUMOCOCCAL CONJUGATE-20 12/21/2020   Tdap 03/22/2021   Zoster Recombinat (Shingrix) 03/22/2021, 04/12/2022    External Records Personally Reviewed: pulmonary, hospital, pcp  Assessment:  Very Severe COPD FEV 1 24% Seasonal  allergic rhinitis History of tobacco use  disorder - last smoked October 2023 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 Will check 2 allergy panel. Start trelegy.   Will get nebulizer machine with albuterol nebs at home.  Stay active.   Will check A1AT level today with Alpha ID mouth swab. Sent today.   Ordering test to be done before next visit.   Have started talking to her about lung transplantation. Hopefully she can get on disability. Pending and echo to screen for PH.   Due in September 2024 for LDCT for lung cancer screening. Was clear in 2023.   Ambulatory desaturation study today qualified her for 2L POC. Will prescribe today.   Return to Care: Return in about 3 months (around 09/02/2022).   Durel Salts, MD Pulmonary and Critical Care Medicine [facility] [phone]

## 2022-06-03 LAB — RESPIRATORY ALLERGY PROFILE REGION II ~~LOC~~

## 2022-06-03 LAB — INTERPRETATION:

## 2022-06-16 ENCOUNTER — Ambulatory Visit (HOSPITAL_COMMUNITY)
Admission: RE | Admit: 2022-06-16 | Discharge: 2022-06-16 | Disposition: A | Discharge status: Home / Self Care | Payer: 59 | Source: Ambulatory Visit | Attending: Internal Medicine | Admitting: Internal Medicine

## 2022-06-16 DIAGNOSIS — Z87891 Personal history of nicotine dependence: Secondary | ICD-10-CM | POA: Insufficient documentation

## 2022-06-16 DIAGNOSIS — R0602 Shortness of breath: Secondary | ICD-10-CM | POA: Diagnosis not present

## 2022-06-16 DIAGNOSIS — J432 Centrilobular emphysema: Secondary | ICD-10-CM | POA: Insufficient documentation

## 2022-06-16 DIAGNOSIS — I272 Pulmonary hypertension, unspecified: Secondary | ICD-10-CM | POA: Diagnosis not present

## 2022-06-16 LAB — ECHOCARDIOGRAM COMPLETE
AR max vel: 2.27 cm2
AV Area VTI: 2.56 cm2
AV Area mean vel: 2.62 cm2
AV Mean grad: 4 mmHg
AV Peak grad: 8.4 mmHg
Ao pk vel: 1.45 m/s
Area-P 1/2: 3.81 cm2
S' Lateral: 2.9 cm

## 2022-06-21 ENCOUNTER — Telehealth: Payer: Self-pay | Admitting: Internal Medicine

## 2022-06-21 ENCOUNTER — Encounter: Payer: Self-pay | Admitting: Neurology

## 2022-06-21 ENCOUNTER — Ambulatory Visit: Payer: 59 | Admitting: Neurology

## 2022-06-21 VITALS — BP 112/81 | HR 68 | Ht 63.0 in | Wt 169.2 lb

## 2022-06-21 DIAGNOSIS — G43009 Migraine without aura, not intractable, without status migrainosus: Secondary | ICD-10-CM | POA: Diagnosis not present

## 2022-06-21 DIAGNOSIS — G4734 Idiopathic sleep related nonobstructive alveolar hypoventilation: Secondary | ICD-10-CM | POA: Diagnosis not present

## 2022-06-21 DIAGNOSIS — G43711 Chronic migraine without aura, intractable, with status migrainosus: Secondary | ICD-10-CM | POA: Insufficient documentation

## 2022-06-21 DIAGNOSIS — R519 Headache, unspecified: Secondary | ICD-10-CM | POA: Insufficient documentation

## 2022-06-21 MED ORDER — NURTEC 75 MG PO TBDP: 75.0000 mg | ORAL_TABLET | Freq: Every day | ORAL | 11 refills | Status: DC | PRN

## 2022-06-21 MED ORDER — NURTEC 75 MG PO TBDP: 75.0000 mg | ORAL_TABLET | Freq: Every day | ORAL | 0 refills | Status: DC | PRN

## 2022-06-21 NOTE — Telephone Encounter (Signed)
Called and spoke with Dr. Lucia Gaskins about patient. She stated that she sees the patient for migraines and she believes the patient may benefit from wearing her O2 at night. She was recently prescribed O2 during her visit with Dr. [name] on 06/02/22 but was told to use it only as needed. Per Dr. Lucia Gaskins, she did have some nocturnal desaturations during a recent hospitalization. Patient has not completed the ONO that was ordered.   I advised Dr. Lucia Gaskins that Dr. [name] was working nights this week but I will send a message over to her to let her know. In the meantime, if she felt that the oxygen at 2L at night would benefit her, it would be ok. She will send a message to the patient to let know.   Will send to Dr. [name] as a FYI.

## 2022-06-21 NOTE — Patient Instructions (Signed)
Nurtec every night next 8 nights Then every other night when you get your prescription I think headaches are exacerbated by hypoxemia/low O2 overnight talk to your doctor and let you know  Rimegepant Disintegrating Tablets What is this medication? RIMEGEPANT (ri ME je pant) prevents and treats migraines. It works by blocking a substance in the body that causes migraines. This medicine may be used for other purposes; ask your health care provider or pharmacist if you have questions. COMMON BRAND NAME(S): NURTEC ODT What should I tell my care team before I take this medication? They need to know if you have any of these conditions: Kidney disease Liver disease An unusual or allergic reaction to rimegepant, other medications, foods, dyes, or preservatives Pregnant or trying to get pregnant Breast-feeding How should I use this medication? Take this medication by mouth. Take it as directed on the prescription label. Leave the tablet in the sealed pack until you are ready to take it. With dry hands, open the pack and gently remove the tablet. If the tablet breaks or crumbles, throw it away. Use a new tablet. Place the tablet in the mouth and allow it to dissolve. Then, swallow it. Do not cut, crush, or chew this medication. You do not need water to take this medication. Talk to your care team about the use of this medication in children. Special care may be needed. Overdosage: If you think you have taken too much of this medicine contact a poison control center or emergency room at once. NOTE: This medicine is only for you. Do not share this medicine with others. What if I miss a dose? This does not apply. This medication is not for regular use. What may interact with this medication? Certain medications for fungal infections, such as fluconazole, itraconazole Rifampin This list may not describe all possible interactions. Give your health care provider a list of all the medicines, herbs,  non-prescription drugs, or dietary supplements you use. Also tell them if you smoke, drink alcohol, or use illegal drugs. Some items may interact with your medicine. What should I watch for while using this medication? Visit your care team for regular checks on your progress. Tell your care team if your symptoms do not start to get better or if they get worse. What side effects may I notice from receiving this medication? Side effects that you should report to your care team as soon as possible: Allergic reactions--skin rash, itching, hives, swelling of the face, lips, tongue, or throat Side effects that usually do not require medical attention (report to your care team if they continue or are bothersome): Nausea Stomach pain This list may not describe all possible side effects. Call your doctor for medical advice about side effects. You may report side effects to FDA at 1-800-FDA-1088. Where should I keep my medication? Keep out of the reach of children and pets. Store at room temperature between 20 and 25 degrees C (68 and 77 degrees F). Get rid of any unused medication after the expiration date. To get rid of medications that are no longer needed or have expired: Take the medication to a medication take-back program. Check with your pharmacy or law enforcement to find a location. If you cannot return the medication, check the label or package insert to see if the medication should be thrown out in the garbage or flushed down the toilet. If you are not sure, ask your care team. If it is safe to put it in the trash, take the  medication out of the container. Mix the medication with cat litter, dirt, coffee grounds, or other unwanted substance. Seal the mixture in a bag or container. Put it in the trash. NOTE: This sheet is a summary. It may not cover all possible information. If you have questions about this medicine, talk to your doctor, pharmacist, or health care provider.  2024 Elsevier/Gold  Standard (2021-02-10 00:00:00)

## 2022-06-21 NOTE — Progress Notes (Addendum)
[facility]    Provider:  Dr Lucia Gaskins Requesting Provider: Cathren Harsh, MD Primary Care Provider:  Etta Grandchild, MD  CC:  morning headaches  HPI:  Jill Glenn is a 57 y.o. female here as requested by Cathren Harsh, MD for migraines. has Tobacco use disorder; Mixed simple and mucopurulent chronic bronchitis (HCC); Panlobular emphysema (HCC); Encounter for general adult medical examination with abnormal findings; Need for vaccination; Screen for colon cancer; Colon cancer screening; COPD, very severe (HCC); Prediabetes; Dyslipidemia, goal LDL below 130; Visit for screening mammogram; Chronic migraine without aura, with intractable migraine, so stated, with status migrainosus; Uncontrolled morning headache; Nocturnal hypoxia; and Migraine without aura and without status migrainosus, not intractable on their problem list.  She has had migraines since the age of 20. Mother had migraines and youngest daughter. They are severe, went to hospital, last an entire day. Starts in the base of the head, pulsating/pounding. Throbbing, lighta nd sound sensitivity, nausea, vomiting, hurts to move, so bad she has vasovagaled, sensitivity n the scalp. Triggers are waking up 9/10. She snores. Tired during the day. Severe COPD she just got her O2 equipment in. And she uses it during the day. Pulmonologist. She was in the Hospital and had BPPV very bad. They said he O2 would drop at night and had to have O2 at night. Takes a power nap during the day, does not wake up refreshed. Wake up a lot. No aura. [facility] nikita desai. 1-2 a month. Called [place] [facility], was on hold, a doctor will call me back,she has severe copd necessitating O2 not usin git as night. No aura. No other focal neurologic deficits, associated symptoms, inciting events or modifiable factors.   Spoke to [facility] neurology otp they called me back. Explained hypoxia overnight can lead to morning headaches. Ok for  her to use it at night and her nurse will review the message tonight and other recommendations. [name]. Send mychart to patient to wear overnight, oxygen. Supposed to use it continuously at night and in the day as needed. Mycharted patient. Spoke to sherita for 15 minutes outside appointment.   Reviewed notes, labs and imaging from outside physicians, which showed:  Tsh normal 04/2022     Latest Ref Rng & Units 03/25/2022    5:50 AM 03/24/2022    7:28 AM 03/23/2022    1:41 PM  CBC  WBC 4.0 - 10.5 K/uL 9.8  12.6  11.7   Hemoglobin 12.0 - 15.0 g/dL 16.1  09.6  04.5   Hematocrit 36.0 - 46.0 % 37.5  40.9  46.7   Platelets 150 - 400 K/uL 298  362  287       Latest Ref Rng & Units 03/25/2022    5:50 AM 03/24/2022    5:46 AM 03/23/2022    8:59 PM  CMP  Glucose 70 - 99 mg/dL 93  409  811   BUN 6 - 20 mg/dL 22  18  18    Creatinine 0.44 - 1.00 mg/dL 9.14  7.82  9.56   Sodium 135 - 145 mmol/L 140  136  134   Potassium 3.5 - 5.1 mmol/L 3.7  4.0  3.7   Chloride 98 - 111 mmol/L 112  106  101   CO2 22 - 32 mmol/L 22  21  22    Calcium 8.9 - 10.3 mg/dL 8.1  9.0  9.2   Total Protein 6.5 - 8.1 g/dL   7.7   Total Bilirubin 0.3 -  1.2 mg/dL   0.7   Alkaline Phos 38 - 126 U/L   54   AST 15 - 41 U/L   24   ALT 0 - 44 U/L   21     03/23/22:    IMPRESSION: Normal brain MRI. No acute intracranial abnormality identified.  From a thorough review of records Meds tried: rizatriptan, zofran, reglan, solumedrol, meclizine. Benadryl, flexeril, ibuprofen, tylenol, ibuprofen, BP meds contraindicated due to hypotension, cannot take amitriptyline or other seratonergic drugs already on seratonin and risk of seratonin syndrome.     Review of Systems: Patient complains of symptoms per HPI as well as the following symptoms migraine. Pertinent negatives and positives per HPI. All others negative.   Social History   Socioeconomic History   Marital status: Married    Spouse name: Thayer Ohm   Number of children: 4    Years of education: Not on file   Highest education level: Associate degree: occupational, Scientist, product/process development, or vocational program  Occupational History   Occupation: accounting  Tobacco Use   Smoking status: Former    Packs/day: 1.00    Years: 40.00    Additional pack years: 0.00    Total pack years: 40.00    Types: Cigarettes    Start date: 03/05/1981    Quit date: 10/14/2021    Years since quitting: 0.6   Smokeless tobacco: Never  Vaping Use   Vaping Use: Never used  Substance and Sexual Activity   Alcohol use: Yes    Comment: rarely    Drug use: No   Sexual activity: Yes    Partners: Male  Other Topics Concern   Not on file  Social History Narrative   Caffiene 2.5 cups coffee in am.  Occ soda.    Working Press photographer for disability (lung issue).   Married, children 2 daughter, 46 Gkids.    Social Determinants of Health   Financial Resource Strain: Medium Risk (03/23/2022)   Overall Financial Resource Strain (CARDIA)    Difficulty of Paying Living Expenses: Somewhat hard  Food Insecurity: No Food Insecurity (03/23/2022)   Hunger Vital Sign    Worried About Running Out of Food in the Last Year: Never true    Ran Out of Food in the Last Year: Never true  Transportation Needs: No Transportation Needs (03/23/2022)   PRAPARE - Administrator, Civil Service (Medical): No    Lack of Transportation (Non-Medical): No  Physical Activity: Insufficiently Active (03/23/2022)   Exercise Vital Sign    Days of Exercise per Week: 3 days    Minutes of Exercise per Session: 40 min  Stress: Stress Concern Present (03/23/2022)   [employer] of Occupational Health - Occupational Stress Questionnaire    Feeling of Stress : Rather much  Social Connections: Moderately Isolated (03/23/2022)   Social Connection and Isolation Panel [NHANES]    Frequency of Communication with Friends and Family: More than three times a week    Frequency of Social Gatherings with Friends and Family: Patient  declined    Attends Religious Services: Never    Database administrator or Organizations: No    Attends Engineer, structural: Not on file    Marital Status: Married  Catering manager Violence: Not on file    Family History  Problem Relation Age of Onset   Migraines Mother    COPD Mother    Lung cancer Mother    Colon polyps Father    Colon cancer Neg Hx  Crohn's disease Neg Hx    Esophageal cancer Neg Hx    Rectal cancer Neg Hx    Stomach cancer Neg Hx    Ulcerative colitis Neg Hx     Past Medical History:  Diagnosis Date   Allergy    seasonal   Anxiety    Asthma    COPD (chronic obstructive pulmonary disease) (HCC)    Emphysema of lung (HCC)     Patient Active Problem List   Diagnosis Date Noted   Chronic migraine without aura, with intractable migraine, so stated, with status migrainosus 06/21/2022   Uncontrolled morning headache 06/21/2022   Nocturnal hypoxia 06/21/2022   Migraine without aura and without status migrainosus, not intractable 06/21/2022   Prediabetes 04/12/2022   Dyslipidemia, goal LDL below 130 04/12/2022   Visit for screening mammogram 04/12/2022   COPD, very severe (HCC) 11/04/2021   Mixed simple and mucopurulent chronic bronchitis (HCC) 12/21/2020   Panlobular emphysema (HCC) 12/21/2020   Encounter for general adult medical examination with abnormal findings 12/21/2020   Need for vaccination 12/21/2020   Screen for colon cancer 12/21/2020   Colon cancer screening 12/21/2020   Tobacco use disorder     Past Surgical History:  Procedure Laterality Date   ABDOMINAL HYSTERECTOMY     BREAST EXCISIONAL BIOPSY     moles     mx 2 removed   TONSILLECTOMY      Current Outpatient Medications  Medication Sig Dispense Refill   acetaminophen (TYLENOL) 325 MG tablet Take 2 tablets (650 mg total) by mouth every 6 (six) hours as needed for moderate pain, mild pain or headache.     albuterol (PROVENTIL) (2.5 MG/3ML) 0.083% nebulizer  solution Take 3 mLs (2.5 mg total) by nebulization every 6 (six) hours as needed for wheezing or shortness of breath. 75 mL 12   albuterol (VENTOLIN HFA) 108 (90 Base) MCG/ACT inhaler Inhale 2 puffs into the lungs every 6 (six) hours as needed for wheezing or shortness of breath. 18 g 3   Ascorbic Acid (VITAMIN C GUMMIES PO) Take 500 mg by mouth daily.     busPIRone (BUSPAR) 5 MG tablet Take 1 tablet (5 mg total) by mouth in the morning. 30 tablet 0   Cranberry 500 MG TABS Take 500 mg by mouth daily as needed (for UTI prevention).     Fluticasone-Umeclidin-Vilant (TRELEGY ELLIPTA) 200-62.5-25 MCG/ACT AEPB Inhale 1 puff into the lungs daily. 1 each 5   levocetirizine (XYZAL) 5 MG tablet Take 1 tablet (5 mg total) by mouth every evening. 30 tablet 11   montelukast (SINGULAIR) 10 MG tablet Take 1 tablet (10 mg total) by mouth at bedtime. 30 tablet 11   Multiple Vitamin (MULTIVITAMIN ADULT) TABS Take 1 tablet by mouth daily with breakfast.     Rimegepant Sulfate (NURTEC) 75 MG TBDP Take 1 tablet (75 mg total) by mouth daily as needed. For migraines. Take as close to onset of migraine as possible. One daily maximum. 16 tablet 11   Rimegepant Sulfate (NURTEC) 75 MG TBDP Take 1 tablet (75 mg total) by mouth daily as needed. For migraines. Take as close to onset of migraine as possible. One daily maximum. 8 tablet 0   rizatriptan (MAXALT) 10 MG tablet Take 1 tablet (10 mg total) by mouth once as needed for migraine. May repeat in 2 hours if needed 30 tablet 2   No current facility-administered medications for this visit.    Allergies as of 06/21/2022 - Review Complete 06/21/2022  Allergen  Reaction Noted   Codeine Nausea And Vomiting, Palpitations, and Other (See Comments) 07/24/2013    Vitals: BP 112/81   Pulse 68   Ht 5\' 3"  (1.6 m)   Wt 169 lb 3.2 oz (76.7 kg)   BMI 29.97 kg/m  Last Weight:  Wt Readings from Last 1 Encounters:  06/21/22 169 lb 3.2 oz (76.7 kg)   Last Height:   Ht Readings  from Last 1 Encounters:  06/21/22 5\' 3"  (1.6 m)     Physical exam: Exam: Gen: NAD, conversant, well nourised, obese, well groomed                     CV: distant heart sounds. No Carotid Bruits. No peripheral edema, warm, nontender Eyes: Conjunctivae clear without exudates or hemorrhage  Neuro: Detailed Neurologic Exam  Speech:    Speech is normal; fluent and spontaneous with normal comprehension.  Cognition:    The patient is oriented to person, place, and time;     recent and remote memory intact;     language fluent;     normal attention, concentration,     fund of knowledge Cranial Nerves:    The pupils are equal, round, and reactive to light. Attempted pupils too small to visualize fundi. Visual fields are full to finger confrontation. Extraocular movements are intact. Trigeminal sensation is intact and the muscles of mastication are normal. The face is symmetric. The palate elevates in the midline. Hearing intact. Voice is normal. Shoulder shrug is normal. The tongue has normal motion without fasciculations.   Coordination:    Normal finger to nose and heel to shin..   Gait:    Heel-toe and tandem gait are normal.   Motor Observation:    No asymmetry, no atrophy, and no involuntary movements noted. Tone:    Normal muscle tone.    Posture:    Posture is normal. normal erect    Strength:    Strength is V/V in the upper and lower limbs.      Sensation: intact to LT     Reflex Exam:  DTR's:    Deep tendon reflexes in the upper and lower extremities are normal bilaterally.   Toes:    The toes are downgoing bilaterally.   Clonus:    Clonus is absent.    Assessment/Plan:  Migraines since the age of 43. Hypoxia overnight also contributing due to severe COPD waiting on lung transplant. MRI brain bormal.  Spoke to [facility] neurology otp they called me back. Explained hypoxia overnight can lead to morning headaches. Ok for her to use it at night and her nurse will  review the message tonight and other recommendations. [name]. Send mychart to patient to wear overnight, oxygen. Supposed to use it continuously at night and in the day as needed. Mycharted patient.   Gave nurtec to take a bedtime  Meds ordered this encounter  Medications   Rimegepant Sulfate (NURTEC) 75 MG TBDP    Sig: Take 1 tablet (75 mg total) by mouth daily as needed. For migraines. Take as close to onset of migraine as possible. One daily maximum.    Dispense:  16 tablet    Refill:  11    Failed imitrex and rizatriptan   Rimegepant Sulfate (NURTEC) 75 MG TBDP    Sig: Take 1 tablet (75 mg total) by mouth daily as needed. For migraines. Take as close to onset of migraine as possible. One daily maximum.    Dispense:  8 tablet  Refill:  0    Cc: Cathren Harsh, MD,  Etta Grandchild, MD  Naomie Dean, MD  [facility] [address] [place], [place] [address]  Phone [phone] Fax [phone]  I spent over 80 minutes of face-to-face and non-face-to-face time with patient on the  1. Chronic daily headache   2. Uncontrolled morning headache   3. Nocturnal hypoxia   4. Migraine without aura and without status migrainosus, not intractable    diagnosis.  This included previsit chart review, lab review, study review, order entry, electronic health record documentation, patient education on the different diagnostic and therapeutic options, counseling and coordination of care, risks and benefits of management, compliance, or risk factor reduction

## 2022-06-21 NOTE — Telephone Encounter (Signed)
Dr. Lucia Gaskins  calling [facility] Dr.) to speak to Dr. [name] about pt. Wished for Dr. To call her back

## 2022-06-22 NOTE — Telephone Encounter (Signed)
Nothing further needed at this time. 

## 2022-07-03 ENCOUNTER — Telehealth: Payer: Self-pay

## 2022-07-03 ENCOUNTER — Other Ambulatory Visit (HOSPITAL_COMMUNITY): Payer: Self-pay

## 2022-07-03 DIAGNOSIS — R0602 Shortness of breath: Secondary | ICD-10-CM | POA: Diagnosis not present

## 2022-07-03 DIAGNOSIS — J449 Chronic obstructive pulmonary disease, unspecified: Secondary | ICD-10-CM | POA: Diagnosis not present

## 2022-07-03 DIAGNOSIS — J432 Centrilobular emphysema: Secondary | ICD-10-CM | POA: Diagnosis not present

## 2022-07-03 DIAGNOSIS — G43009 Migraine without aura, not intractable, without status migrainosus: Secondary | ICD-10-CM

## 2022-07-03 NOTE — Telephone Encounter (Signed)
Pharmacy Patient Advocate Encounter   Received notification from [employer] that prior authorization for Nurtec 75MG  dispersible tablets is required/requested.   PA submitted to [facility] via CoverMyMeds Key or (Medicaid) confirmation # [id number] Status is pending

## 2022-07-04 ENCOUNTER — Other Ambulatory Visit (HOSPITAL_COMMUNITY): Payer: Self-pay

## 2022-07-04 NOTE — Telephone Encounter (Signed)
Pharmacy Patient Advocate Encounter  Received notification from [facility] that the request for prior authorization for Nurtec has been denied due to see below.     Please be advised we currently do not have a Pharmacist to review denials, therefore you will need to process appeals accordingly as needed. Thanks for your support at this time.   You may call [phone] or fax [phone], to appeal.  The denial letter has been scanned into the chart under the media tab.

## 2022-07-05 MED ORDER — UBRELVY 100 MG PO TABS
ORAL_TABLET | ORAL | 11 refills | Status: AC
Start: 2022-07-05 — End: ?

## 2022-07-05 NOTE — Telephone Encounter (Signed)
Approved, tell her to sign up for the copay card online and show it to the pharmacy will make her copay a lot less!

## 2022-07-08 ENCOUNTER — Telehealth: Payer: Self-pay

## 2022-07-08 ENCOUNTER — Other Ambulatory Visit (HOSPITAL_COMMUNITY): Payer: Self-pay

## 2022-07-08 NOTE — Telephone Encounter (Signed)
Pharmacy Patient Advocate Encounter   Received notification from [facility] that prior authorization for Ubrelvy 100MG  tablets is required/requested.   PA submitted to [facility] via CoverMyMeds Key or (Medicaid) confirmation # [id number] Status is pending

## 2022-07-11 ENCOUNTER — Other Ambulatory Visit (HOSPITAL_COMMUNITY): Payer: Self-pay

## 2022-07-11 NOTE — Telephone Encounter (Signed)
Pharmacy Patient Advocate Encounter  Prior Authorization for Ubrelvy 100MG  tablets has been APPROVED by [facility] from 07/08/2022 to 07/07/2023.   PA # PA Case ID #: [id number]  Spoke with Pharmacy to process. Copay is $0 per [facility] test claim.

## 2022-07-25 ENCOUNTER — Telehealth (HOSPITAL_COMMUNITY): Payer: Self-pay

## 2022-07-25 NOTE — Telephone Encounter (Signed)
Pt called and VM was left in regards to [employer] Pulmonary wellness program.

## 2022-08-02 DIAGNOSIS — J432 Centrilobular emphysema: Secondary | ICD-10-CM | POA: Diagnosis not present

## 2022-08-02 DIAGNOSIS — R0602 Shortness of breath: Secondary | ICD-10-CM | POA: Diagnosis not present

## 2022-08-02 DIAGNOSIS — J449 Chronic obstructive pulmonary disease, unspecified: Secondary | ICD-10-CM | POA: Diagnosis not present

## 2022-09-02 DIAGNOSIS — R0602 Shortness of breath: Secondary | ICD-10-CM | POA: Diagnosis not present

## 2022-09-02 DIAGNOSIS — J449 Chronic obstructive pulmonary disease, unspecified: Secondary | ICD-10-CM | POA: Diagnosis not present

## 2022-09-02 DIAGNOSIS — J432 Centrilobular emphysema: Secondary | ICD-10-CM | POA: Diagnosis not present

## 2022-09-07 ENCOUNTER — Telehealth: Payer: Self-pay

## 2022-09-07 ENCOUNTER — Ambulatory Visit: Payer: 59 | Admitting: Internal Medicine

## 2022-09-07 ENCOUNTER — Encounter: Payer: Self-pay | Admitting: Internal Medicine

## 2022-09-07 ENCOUNTER — Other Ambulatory Visit (HOSPITAL_COMMUNITY): Payer: Self-pay

## 2022-09-07 VITALS — BP 100/77 | HR 77 | Ht 63.0 in | Wt 176.8 lb

## 2022-09-07 DIAGNOSIS — J9611 Chronic respiratory failure with hypoxia: Secondary | ICD-10-CM | POA: Diagnosis not present

## 2022-09-07 DIAGNOSIS — J439 Emphysema, unspecified: Secondary | ICD-10-CM | POA: Diagnosis not present

## 2022-09-07 DIAGNOSIS — Z0271 Encounter for disability determination: Secondary | ICD-10-CM

## 2022-09-07 DIAGNOSIS — J4489 Other specified chronic obstructive pulmonary disease: Secondary | ICD-10-CM

## 2022-09-07 MED ORDER — BREZTRI AEROSPHERE 160-9-4.8 MCG/ACT IN AERO
2.0000 | INHALATION_SPRAY | Freq: Two times a day (BID) | RESPIRATORY_TRACT | 3 refills | Status: DC
Start: 2022-09-07 — End: 2022-09-13

## 2022-09-07 MED ORDER — ALBUTEROL SULFATE HFA 108 (90 BASE) MCG/ACT IN AERS
2.0000 | INHALATION_SPRAY | Freq: Four times a day (QID) | RESPIRATORY_TRACT | 3 refills | Status: DC | PRN
Start: 2022-09-07 — End: 2023-10-16

## 2022-09-07 NOTE — Patient Instructions (Addendum)
It was a pleasure to see you today!  Please schedule follow up scheduled with myself in 4 months.  If my schedule is not open yet, we will contact you with a reminder closer to that time. Please call [phone] if you haven't heard from [place] a month before, and always call us sooner if issues or concerns arise. You can also send [place] a message through MyChart, but but aware that this is not to be used for urgent issues and it may take up to 5-7 days to receive a reply.   Before your next visit I would like you to have: Blood work today - on your way out.   Will need prior authorization for [facility] - I will work on this.  Continue trelegy until this is approved.   Continue albuterol as needed.  Continue to stay active.   Continue xyzal and singulair for allergies.  I am referring you to [facility] for a conversation about lung transplantation - I think it is still early for you.   I recommend flu shot, RSV, and Covid vaccines this fall.

## 2022-09-07 NOTE — Telephone Encounter (Signed)
Pharmacy Patient Advocate Encounter   Received notification from Physician's Office that prior authorization for Breztri Aerosphere 160-9-4.8MCG/ACT aerosol is required/requested.   Insurance verification completed.   The patient is insured through [facility] .   Per test claim: PA required; PA submitted to [facility] via CoverMyMeds Key/confirmation #/EOC [id number] Status is pending

## 2022-09-07 NOTE — Progress Notes (Signed)
Jill Glenn    [id number]    [birth year]  Primary Care Physician:Jones, Bernadene Bell, MD Date of Appointment: 09/07/2022 Established Patient Visit  Chief complaint:   Chief Complaint  Patient presents with   Follow-up    Pt is here for F/U visit. Pt has no complaints today.     HPI: Jill Glenn is a 57 y.o. woman with COPD, very severe FEV1 24% of predicted. She did all 13 weeks of pulmonary rehab, finished in feb 2024. Did really well, had dramatic improvement. She is on [facility] with POC. Quit smoking October 2023.   Interval Updates: Here for follow up. Has had a good summer. Went to [place], went camping, going to the zoo. Trying to stay active and  Did really well on breztri. She had to switch to trelegy due to insurance issues. It is not working as well. The evenings the medication starts to wear off. She also has to use her rescue inhaler more often.   Would like to go back on breztri.   No interval exacerbations for her COPD. Occasionally used albuterol nebs with humidity.   Taking xyzal and singulair for allergies and doing very well, minimal rhinorrhea.   She is insured and is SS/disability pending.   She has made dramatic changes in her diet - trying to focus on whole foods, vitamins, minerals. Cooking more at home, less sugar. She has also started going to church.   I have reviewed the patient's family social and past medical history and updated as appropriate.   Past Medical History:  Diagnosis Date   Allergy    seasonal   Anxiety    Asthma    COPD (chronic obstructive pulmonary disease) (HCC)    Emphysema of lung (HCC)     Past Surgical History:  Procedure Laterality Date   ABDOMINAL HYSTERECTOMY     BREAST EXCISIONAL BIOPSY     moles     mx 2 removed   TONSILLECTOMY      Family History  Problem Relation Age of Onset   Migraines Mother    COPD Mother    Lung cancer Mother    Colon polyps Father    Colon cancer Neg Hx     Crohn's disease Neg Hx    Esophageal cancer Neg Hx    Rectal cancer Neg Hx    Stomach cancer Neg Hx    Ulcerative colitis Neg Hx     Social History   Occupational History   Occupation: Audiological scientist  Tobacco Use   Smoking status: Former    Current packs/day: 0.00    Average packs/day: 1 pack/day for 40.6 years (40.6 ttl pk-yrs)    Types: Cigarettes    Start date: 03/05/1981    Quit date: 10/14/2021    Years since quitting: 0.8   Smokeless tobacco: Never  Vaping Use   Vaping status: Never Used  Substance and Sexual Activity   Alcohol use: Yes    Comment: rarely    Drug use: No   Sexual activity: Yes    Partners: Male     Physical Exam: Blood pressure 100/77, pulse 77, height 5\' 3"  (1.6 m), weight 176 lb 12.8 oz (80.2 kg), SpO2 97%.  Gen:      No acute distress, well appearing Lungs:    diminished, clear CV:        diminished, clear, no edema   Data Reviewed: Imaging: I have personally reviewed the CT  Chest Sept 2023 with extensive emphysema  PFTs:     Latest Ref Rng & Units 10/15/2021    8:50 AM  PFT Results  FVC-Pre L 1.58   FVC-Predicted Pre % 47   FVC-Post L 1.92   FVC-Predicted Post % 58   Pre FEV1/FVC % % 40   Post FEV1/FCV % % 41   FEV1-Pre L 0.64   FEV1-Predicted Pre % 24   FEV1-Post L 0.79   DLCO uncorrected ml/min/mmHg 11.71   DLCO UNC% % 58   DLCO corrected ml/min/mmHg 11.71   DLCO COR %Predicted % 58   DLVA Predicted % 67   TLC L 6.59   TLC % Predicted % 134   RV % Predicted % 253    I have personally reviewed the patient's PFTs and very severe COPD FEv1  Labs:  Immunization status: Immunization History  Administered Date(s) Administered   Influenza,inj,Quad PF,6+ Mos 12/21/2020, 10/14/2021   PNEUMOCOCCAL CONJUGATE-20 12/21/2020   Tdap 03/22/2021   Zoster Recombinant(Shingrix) 03/22/2021, 04/12/2022    External Records Personally Reviewed: pulmonary, hospital, pcp  Assessment:  Very Severe COPD FEV 1 24% Seasonal allergic  rhinitis History of tobacco use disorder - last smoked October 2023 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  Has tried and failed pulmicort, Breo, Duoneb, spiriva, incruse. Will need prior authorization for [facility].  Continue xyzal and singulair for allergies.   Stay active as you are doing.   Will check A1AT level today - serum.   Echo done - no PAH.   Due in September 26th 2024 for LDCT for lung cancer screening. Was clear in 2023.   Continue 2LNC with exertion  Flu, rsv, covid shots in fall.   Will refer to [facility] for lung transplantation current BODE score ~6.   Return to Care: Return in about 4 months (around 01/07/2023).   Durel Salts, MD Pulmonary and Critical Care Medicine [facility] [phone]

## 2022-09-08 ENCOUNTER — Other Ambulatory Visit (HOSPITAL_COMMUNITY): Payer: Self-pay

## 2022-09-08 LAB — ALPHA-1-ANTITRYPSIN: A-1 Antitrypsin, Ser: 163 mg/dL (ref 83–199)

## 2022-09-08 NOTE — Telephone Encounter (Signed)
Please advise of change due to ins declining Breztri. Test claim ran by pharmacy dept.

## 2022-09-08 NOTE — Telephone Encounter (Signed)
Can we appeal with my note saying that she has failed anoro, trelegy?

## 2022-09-08 NOTE — Telephone Encounter (Signed)
Patient failed Anoro. Is it try or fail one drug or multiples on preferred list?

## 2022-09-08 NOTE — Telephone Encounter (Signed)
Pharmacy Patient Advocate Encounter  Received notification from [facility] -  that Prior Authorization for [facility] Aerosphere 160-9-4.8MCG/ACT aerosol has been DENIED.  Full denial letter will be uploaded to the media tab. See denial reason below.  Your plan only covers this drug when you meet one of these options: A) You have tried other products your plan covers (preferred products), and they did not work well for you, or B) Your doctor gives [place] a medical reason you cannot take those other products. For your plan, you may need to try up to three preferred products. The preferred  products for your plan are: Anoro Ellipta, Bevespi Aerosphere, Trelegy Ellipta. Your doctor may need to get approval from your plan for preferred products.   PA #/Case ID/Reference #: [id number]  Please be advised we currently do not have a Pharmacist to review denials, therefore you will need to process appeals accordingly as needed. Thanks for your support at this time. Contact for appeals are as follows: Phone: [phone], Fax: xxx

## 2022-09-13 MED ORDER — BEVESPI AEROSPHERE 9-4.8 MCG/ACT IN AERO: 2.0000 | INHALATION_SPRAY | Freq: Two times a day (BID) | RESPIRATORY_TRACT | 11 refills | Status: DC

## 2022-09-13 MED ORDER — BUDESONIDE 0.5 MG/2ML IN SUSP: 0.5000 mg | Freq: Two times a day (BID) | RESPIRATORY_TRACT | 11 refills | Status: DC

## 2022-09-13 NOTE — Addendum Note (Signed)
Addended by: Durel Salts on: 09/13/2022 04:45 PM   Modules accepted: Orders

## 2022-09-29 ENCOUNTER — Ambulatory Visit
Admission: RE | Admit: 2022-09-29 | Discharge: 2022-09-29 | Disposition: A | Discharge status: Home / Self Care | Payer: 59 | Source: Ambulatory Visit

## 2022-09-29 DIAGNOSIS — Z122 Encounter for screening for malignant neoplasm of respiratory organs: Secondary | ICD-10-CM

## 2022-09-29 DIAGNOSIS — F17210 Nicotine dependence, cigarettes, uncomplicated: Secondary | ICD-10-CM

## 2022-09-29 DIAGNOSIS — Z87891 Personal history of nicotine dependence: Secondary | ICD-10-CM

## 2022-10-03 DIAGNOSIS — J449 Chronic obstructive pulmonary disease, unspecified: Secondary | ICD-10-CM | POA: Diagnosis not present

## 2022-10-03 DIAGNOSIS — R0602 Shortness of breath: Secondary | ICD-10-CM | POA: Diagnosis not present

## 2022-10-03 DIAGNOSIS — J432 Centrilobular emphysema: Secondary | ICD-10-CM | POA: Diagnosis not present

## 2022-10-05 DIAGNOSIS — Z7682 Awaiting organ transplant status: Secondary | ICD-10-CM | POA: Diagnosis not present

## 2022-10-05 DIAGNOSIS — R918 Other nonspecific abnormal finding of lung field: Secondary | ICD-10-CM | POA: Diagnosis not present

## 2022-10-06 DIAGNOSIS — R0602 Shortness of breath: Secondary | ICD-10-CM | POA: Diagnosis not present

## 2022-10-06 DIAGNOSIS — Z7682 Awaiting organ transplant status: Secondary | ICD-10-CM | POA: Diagnosis not present

## 2022-10-07 DIAGNOSIS — Z008 Encounter for other general examination: Secondary | ICD-10-CM | POA: Diagnosis not present

## 2022-10-07 DIAGNOSIS — Z7682 Awaiting organ transplant status: Secondary | ICD-10-CM | POA: Diagnosis not present

## 2022-10-07 DIAGNOSIS — F4323 Adjustment disorder with mixed anxiety and depressed mood: Secondary | ICD-10-CM | POA: Diagnosis not present

## 2022-10-10 ENCOUNTER — Encounter: Payer: Self-pay | Admitting: Internal Medicine

## 2022-10-12 DIAGNOSIS — Z87892 Personal history of anaphylaxis: Secondary | ICD-10-CM | POA: Diagnosis not present

## 2022-10-12 DIAGNOSIS — Z8249 Family history of ischemic heart disease and other diseases of the circulatory system: Secondary | ICD-10-CM | POA: Diagnosis not present

## 2022-10-12 DIAGNOSIS — M1990 Unspecified osteoarthritis, unspecified site: Secondary | ICD-10-CM | POA: Diagnosis not present

## 2022-10-12 DIAGNOSIS — R32 Unspecified urinary incontinence: Secondary | ICD-10-CM | POA: Diagnosis not present

## 2022-10-12 DIAGNOSIS — J9610 Chronic respiratory failure, unspecified whether with hypoxia or hypercapnia: Secondary | ICD-10-CM | POA: Diagnosis not present

## 2022-10-12 DIAGNOSIS — E669 Obesity, unspecified: Secondary | ICD-10-CM | POA: Diagnosis not present

## 2022-10-12 DIAGNOSIS — Z818 Family history of other mental and behavioral disorders: Secondary | ICD-10-CM | POA: Diagnosis not present

## 2022-10-12 DIAGNOSIS — Z7951 Long term (current) use of inhaled steroids: Secondary | ICD-10-CM | POA: Diagnosis not present

## 2022-10-12 DIAGNOSIS — Z885 Allergy status to narcotic agent status: Secondary | ICD-10-CM | POA: Diagnosis not present

## 2022-10-12 DIAGNOSIS — J439 Emphysema, unspecified: Secondary | ICD-10-CM | POA: Diagnosis not present

## 2022-10-12 DIAGNOSIS — Z87891 Personal history of nicotine dependence: Secondary | ICD-10-CM | POA: Diagnosis not present

## 2022-10-12 DIAGNOSIS — Z809 Family history of malignant neoplasm, unspecified: Secondary | ICD-10-CM | POA: Diagnosis not present

## 2022-10-13 ENCOUNTER — Encounter: Payer: Self-pay | Admitting: Internal Medicine

## 2022-10-13 DIAGNOSIS — J449 Chronic obstructive pulmonary disease, unspecified: Secondary | ICD-10-CM

## 2022-10-13 DIAGNOSIS — R9389 Abnormal findings on diagnostic imaging of other specified body structures: Secondary | ICD-10-CM

## 2022-10-17 ENCOUNTER — Encounter: Payer: Self-pay | Admitting: Internal Medicine

## 2022-10-21 IMAGING — CT CT CHEST LUNG CANCER SCREENING LOW DOSE W/O CM
1 series · 10 of 10 positions shown, 13 images · non-contrast
Comparison: None.

CLINICAL DATA: Current smoker, 60 pack-year history.



[ct lung segmentation data · axial · 0.66mm/px · z∈[-338,-338]mm · 10 of 336 frames shown]
[frame 1/336  mediastinal]
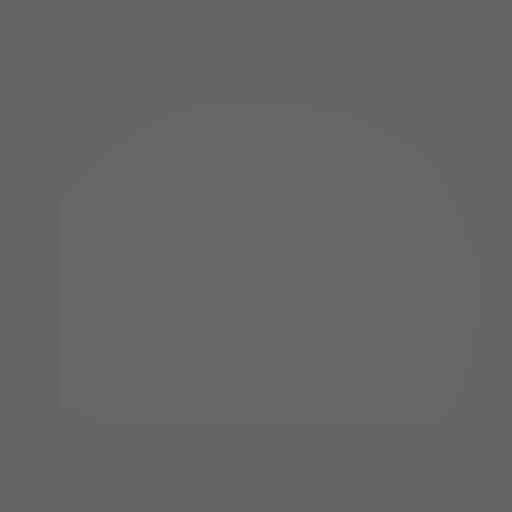
[frame 1/336  lung]
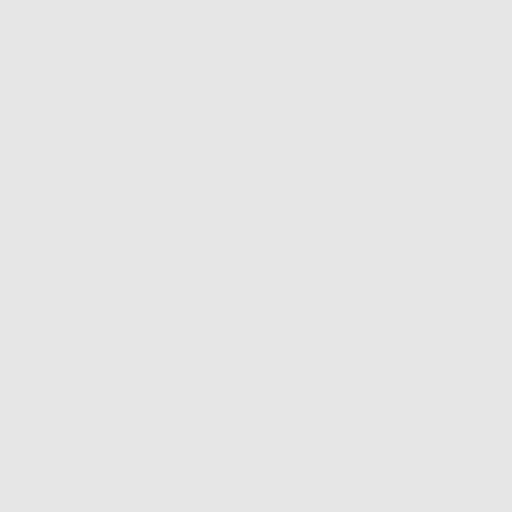
[frame 38/336  lung]
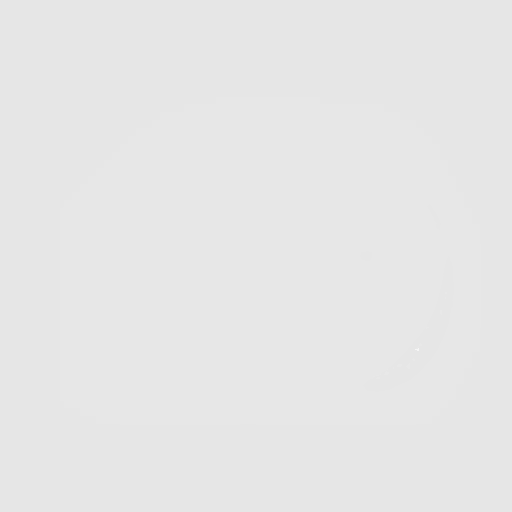
[frame 75/336  lung]
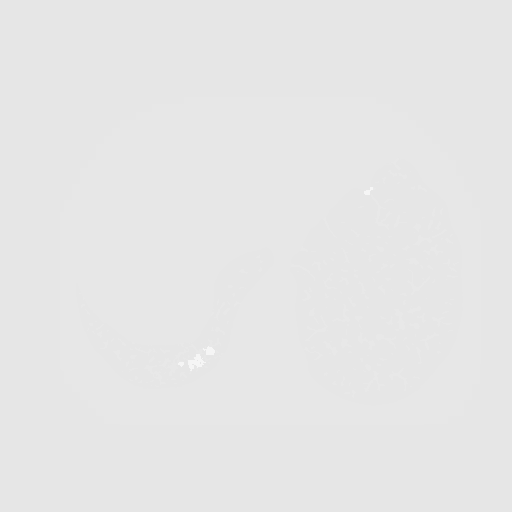
[frame 112/336  lung]
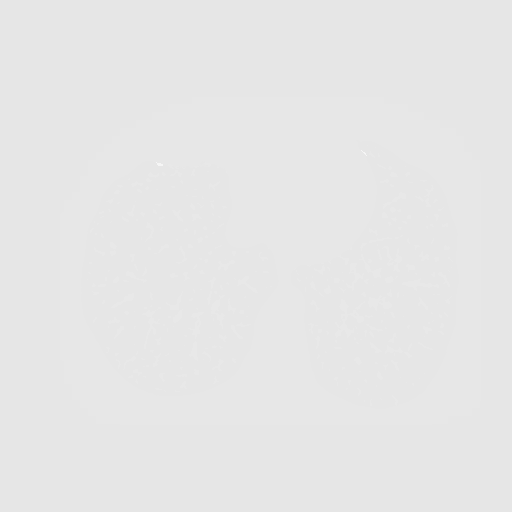
[frame 149/336  mediastinal]
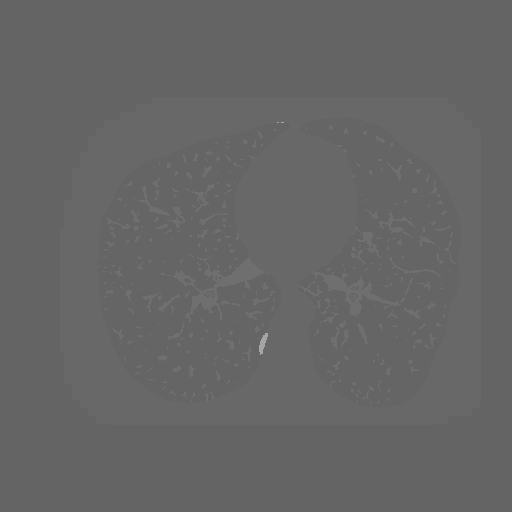
[frame 149/336  lung]
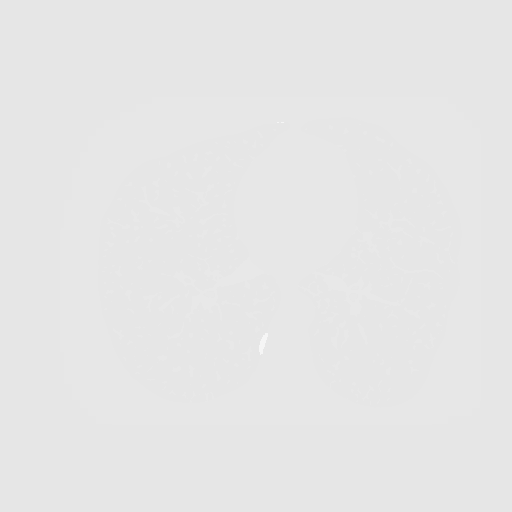
[frame 187/336  lung]
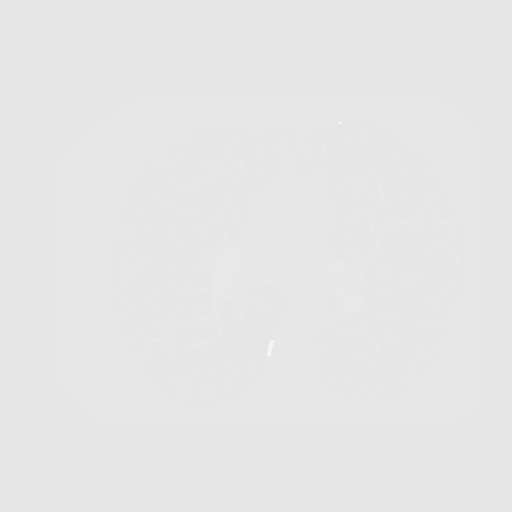
[frame 224/336  lung]
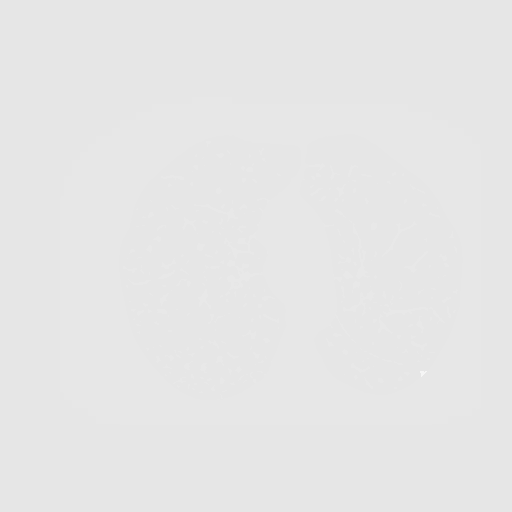
[frame 261/336  lung]
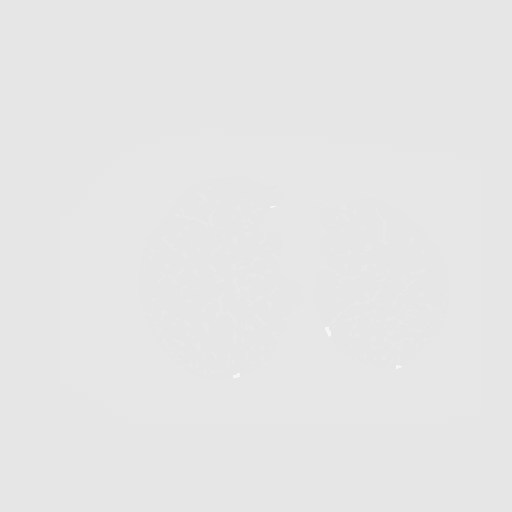
[frame 298/336  mediastinal]
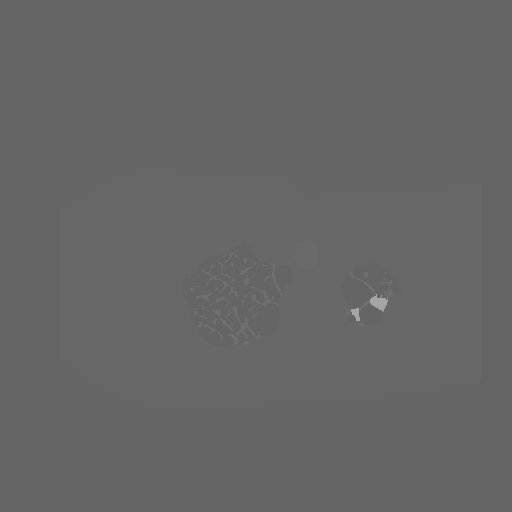
[frame 298/336  lung]
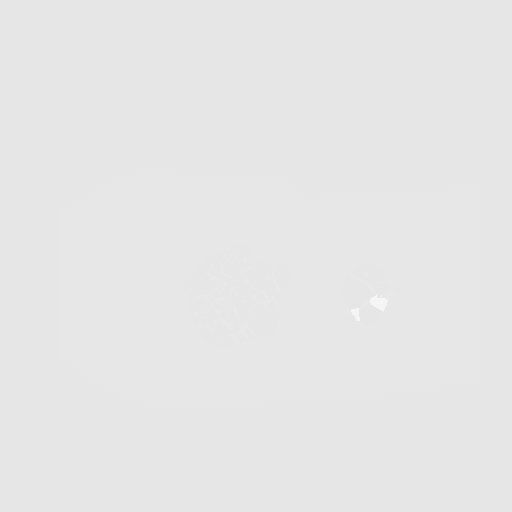
[frame 336/336  lung]
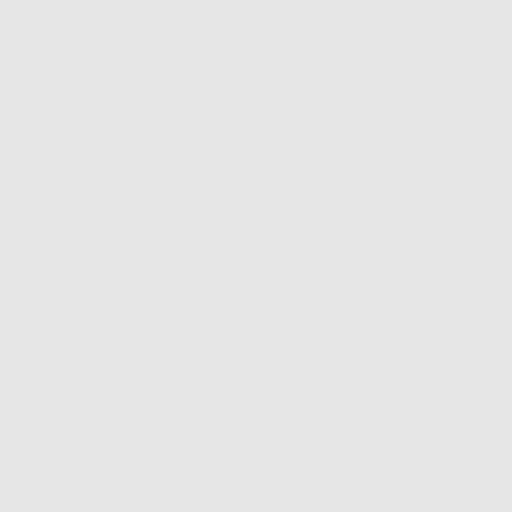

[10 of 10 positions shown; findings below may reference images not displayed]

FINDINGS: Cardiovascular: Heart size normal.  No pericardial effusion.

Mediastinum/Nodes: No pathologically enlarged mediastinal or
axillary lymph nodes. Hilar regions are difficult to definitively
evaluate without IV contrast. Esophagus is grossly unremarkable.

Lungs/Pleura: Biapical pleuroparenchymal scarring. Centrilobular and
paraseptal emphysema. Numerous calcified granulomas. Subpleural
atelectasis or scarring in the posterior right upper lobe (3/86).
7.8 mm subpleural left lower lobe nodule (3/293). No pleural fluid.
Minimal debris in the airway.

Upper Abdomen: Visualized portions of the liver, adrenal glands,
kidneys, spleen, pancreas, stomach and bowel are grossly
unremarkable.

Musculoskeletal: Degenerative changes in the spine. No worrisome
lytic or sclerotic lesions.
IMPRESSION: 1. Lung-RADS 3, probably benign findings. Short-term follow-up in 6
months is recommended with repeat low-dose chest CT without contrast
(please use the following order, "CT CHEST LCS NODULE FOLLOW-UP W/O
CM").

7.8 mm subpleural nodule in the left lower lobe, possibly a benign
subpleural lymph node.

These results will be called to the ordering clinician or
representative by the Radiologist Assistant, and communication
documented in the PACS or [REDACTED].
2.  Emphysema ([id number]).

## 2022-10-24 ENCOUNTER — Encounter (HOSPITAL_COMMUNITY): Payer: Self-pay

## 2022-10-24 ENCOUNTER — Telehealth (HOSPITAL_COMMUNITY): Payer: Self-pay

## 2022-10-24 NOTE — Telephone Encounter (Signed)
Called patient to see if she was interested in participating in the pulmonary Rehab Program. Patient stated yes. Patient will come in for orientation on 10/23@1030  and will attend the 1015 exercise class.

## 2022-10-25 ENCOUNTER — Telehealth (HOSPITAL_COMMUNITY): Payer: Self-pay

## 2022-10-25 DIAGNOSIS — F17201 Nicotine dependence, unspecified, in remission: Secondary | ICD-10-CM | POA: Diagnosis not present

## 2022-10-25 DIAGNOSIS — J449 Chronic obstructive pulmonary disease, unspecified: Secondary | ICD-10-CM | POA: Diagnosis not present

## 2022-10-25 NOTE — Telephone Encounter (Signed)
Called to confirm appt. Pt confirmed appt. Instructed pt on proper footwear. Gave directions along with department number.

## 2022-10-26 ENCOUNTER — Encounter (HOSPITAL_COMMUNITY)
Admission: RE | Admit: 2022-10-26 | Discharge: 2022-10-26 | Disposition: A | Discharge status: Home / Self Care | Payer: 59 | Source: Ambulatory Visit | Attending: Internal Medicine | Admitting: Internal Medicine

## 2022-10-26 ENCOUNTER — Encounter (HOSPITAL_COMMUNITY): Payer: Self-pay

## 2022-10-26 VITALS — BP 106/70 | HR 74 | Wt 177.9 lb

## 2022-10-26 DIAGNOSIS — J449 Chronic obstructive pulmonary disease, unspecified: Secondary | ICD-10-CM | POA: Insufficient documentation

## 2022-10-26 NOTE — Progress Notes (Signed)
Pulmonary Individual Treatment Plan  Patient Details  Name: Jill Glenn MRN: [id number] Date of Birth: Apr 17, 1965 Referring Provider:   Doristine Devoid Pulmonary Rehab Walk Test from 10/26/2022 in [facility], V[facility], & Lung Health  Referring Provider [name]       Initial Encounter Date:  Flowsheet Row Pulmonary Rehab Walk Test from 10/26/2022 in [facility] for Heart, Vascular, & Lung Health  Date 10/26/22       Visit Diagnosis: Stage 3 severe COPD by GOLD classification (HCC)  Patient's Home Medications on Admission:   Current Outpatient Medications:    acetaminophen (TYLENOL) 325 MG tablet, Take 2 tablets (650 mg total) by mouth every 6 (six) hours as needed for moderate pain, mild pain or headache., Disp: , Rfl:    albuterol (PROVENTIL) (2.5 MG/3ML) 0.083% nebulizer solution, Take 3 mLs (2.5 mg total) by nebulization every 6 (six) hours as needed for wheezing or shortness of breath., Disp: 75 mL, Rfl: 12   albuterol (VENTOLIN HFA) 108 (90 Base) MCG/ACT inhaler, Inhale 2 puffs into the lungs every 6 (six) hours as needed for wheezing or shortness of breath., Disp: 18 g, Rfl: 3   Ascorbic Acid (VITAMIN C GUMMIES PO), Take 500 mg by mouth daily., Disp: , Rfl:    budesonide (PULMICORT) 0.5 MG/2ML nebulizer solution, Take 2 mLs (0.5 mg total) by nebulization in the morning and at bedtime., Disp: 60 mL, Rfl: 11   Cranberry 500 MG TABS, Take 500 mg by mouth daily as needed (for UTI prevention)., Disp: , Rfl:    Glycopyrrolate-Formoterol (BEVESPI AEROSPHERE) 9-4.8 MCG/ACT AERO, Inhale 2 puffs into the lungs 2 (two) times daily., Disp: 1 each, Rfl: 11   levocetirizine (XYZAL) 5 MG tablet, Take 1 tablet (5 mg total) by mouth every evening., Disp: 30 tablet, Rfl: 11   montelukast (SINGULAIR) 10 MG tablet, Take 1 tablet (10 mg total) by mouth at bedtime., Disp: 30 tablet, Rfl: 11   Multiple Vitamin (MULTIVITAMIN ADULT) TABS, Take  1 tablet by mouth daily with breakfast., Disp: , Rfl:    rizatriptan (MAXALT) 10 MG tablet, Take 1 tablet (10 mg total) by mouth once as needed for migraine. May repeat in 2 hours if needed, Disp: 30 tablet, Rfl: 2   Ubrogepant (UBRELVY) 100 MG TABS, Take one tablet onset of Migraine May take another tablet 2 hours later, Don't exceed two tablets daily, Disp: 16 tablet, Rfl: 11   varenicline (CHANTIX) 1 MG tablet, Take 1 mg by mouth daily., Disp: , Rfl:   Past Medical History: Past Medical History:  Diagnosis Date   Allergy    seasonal   Anxiety    Asthma    COPD (chronic obstructive pulmonary disease) (HCC)    Emphysema of lung (HCC)     Tobacco Use: Social History   Tobacco Use  Smoking Status Former   Current packs/day: 0.00   Average packs/day: 1 pack/day for 40.6 years (40.6 ttl pk-yrs)   Types: Cigarettes   Start date: 03/05/1981   Quit date: 10/14/2021   Years since quitting: 1.0  Smokeless Tobacco Never    Labs: Review Flowsheet       Latest Ref Rng & Units 11/02/2020 12/21/2020 03/24/2022 04/12/2022  Labs for ITP Cardiac and Pulmonary Rehab  Cholestrol 0 - 200 mg/dL - 409  - 811   LDL (calc) 0 - 99 mg/dL - 91  - 914   HDL-C >78.29 mg/dL - 56.21  - 30.86  Trlycerides 0.0 - 149.0 mg/dL - 27.2  - 536.6   Hemoglobin A1c 4.8 - 5.6 % - - 6.0  -  Bicarbonate 20.0 - 28.0 mmol/L 22.2  - - -  TCO2 22 - 32 mmol/L 23  - - -  Acid-base deficit 0.0 - 2.0 mmol/L 3.0  - - -  O2 Saturation % 90.0  - - -    Details            Capillary Blood Glucose: Lab Results  Component Value Date   GLUCAP 73 03/26/2022   GLUCAP 148 (H) 03/25/2022   GLUCAP 166 (H) 03/25/2022   GLUCAP 88 03/25/2022   GLUCAP 84 03/25/2022     Pulmonary Assessment Scores:  Pulmonary Assessment Scores     Row Name 10/26/22 1135         ADL UCSD   ADL Phase Entry     SOB Score total 54       CAT Score   CAT Score 16       mMRC Score   mMRC Score 4              UCSD: Self-administered rating of dyspnea associated with activities of daily living (ADLs) 6-point scale (0 = "not at all" to 5 = "maximal or unable to do because of breathlessness")  Scoring Scores range from 0 to 120.  Minimally important difference is 5 units  CAT: CAT can identify the health impairment of COPD patients and is better correlated with disease progression.  CAT has a scoring range of zero to 40. The CAT score is classified into four groups of low (less than 10), medium (10 - 20), high (21-30) and very high (31-40) based on the impact level of disease on health status. A CAT score over 10 suggests significant symptoms.  A worsening CAT score could be explained by an exacerbation, poor medication adherence, poor inhaler technique, or progression of COPD or comorbid conditions.  CAT MCID is 2 points  mMRC: mMRC (Modified Medical Research Council) Dyspnea Scale is used to assess the degree of baseline functional disability in patients of respiratory disease due to dyspnea. No minimal important difference is established. A decrease in score of 1 point or greater is considered a positive change.   Pulmonary Function Assessment:  Pulmonary Function Assessment - 10/26/22 1109       Breath   Bilateral Breath Sounds Clear;Decreased    Shortness of Breath Limiting activity;Panic with Shortness of Breath   only panics if she doesn't have her inhaler with her            Exercise Target Goals: Exercise Program Goal: Individual exercise prescription set using results from initial 6 min walk test and THRR while considering  patient's activity barriers and safety.   Exercise Prescription Goal: Initial exercise prescription builds to 30-45 minutes a day of aerobic activity, 2-3 days per week.  Home exercise guidelines will be given to patient during program as part of exercise prescription that the participant will acknowledge.  Activity Barriers & Risk Stratification:   Activity Barriers & Cardiac Risk Stratification - 10/26/22 1107       Activity Barriers & Cardiac Risk Stratification   Activity Barriers Deconditioning;Muscular Weakness;Shortness of Breath;Arthritis;Back Problems    Cardiac Risk Stratification Moderate             6 Minute Walk:  6 Minute Walk     Row Name 10/26/22 1201         6  Minute Walk   Phase Initial     Distance 1125 feet     Walk Time 6 minutes     # of Rest Breaks 1  2:12-3:00     MPH 2.13     METS 2.98     RPE 14.5     Perceived Dyspnea  3     VO2 Peak 10.43     Symptoms No     Resting HR 74 bpm     Resting BP 106/70     Resting Oxygen Saturation  95 %     Exercise Oxygen Saturation  during 6 min walk 84 %     Max Ex. HR 107 bpm     Max Ex. BP 110/70     2 Minute Post BP 106/60       Interval HR   1 Minute HR 83     2 Minute HR 85     3 Minute HR 90     4 Minute HR 89     5 Minute HR 107     6 Minute HR 98     2 Minute Post HR 76     Interval Heart Rate? Yes       Interval Oxygen   Interval Oxygen? Yes     Baseline Oxygen Saturation % 95 %     1 Minute Oxygen Saturation % 97 %     1 Minute Liters of Oxygen 0 L     2 Minute Oxygen Saturation % 89 %     2 Minute Liters of Oxygen 0 L     3 Minute Oxygen Saturation % 94 %  2:12 84%     3 Minute Liters of Oxygen 0 L  increased to 2L     4 Minute Oxygen Saturation % 92 %     4 Minute Liters of Oxygen 2 L     5 Minute Oxygen Saturation % 92 %     5 Minute Liters of Oxygen 2 L     6 Minute Oxygen Saturation % 89 %     6 Minute Liters of Oxygen 2 L     2 Minute Post Oxygen Saturation % 97 %     2 Minute Post Liters of Oxygen 2 L              Oxygen Initial Assessment:  Oxygen Initial Assessment - 10/26/22 1108       Home Oxygen   Home Oxygen Device Home Concentrator;Portable Concentrator;E-Tanks    Sleep Oxygen Prescription Continuous    Liters per minute 2    Home Exercise Oxygen Prescription Pulsed    Liters per minute 2    Home  Resting Oxygen Prescription None    Compliance with Home Oxygen Use Yes      Initial 6 min Walk   Oxygen Used Continuous    Liters per minute 2      Program Oxygen Prescription   Program Oxygen Prescription Continuous    Liters per minute 2      Intervention   Short Term Goals To learn and exhibit compliance with exercise, home and travel O2 prescription;To learn and understand importance of monitoring SPO2 with pulse oximeter and demonstrate accurate use of the pulse oximeter.;To learn and understand importance of maintaining oxygen saturations>88%;To learn and demonstrate proper pursed lip breathing techniques or other breathing techniques. ;To learn and demonstrate proper use of respiratory medications    Long  Term Goals Exhibits compliance  with exercise, home  and travel O2 prescription;Maintenance of O2 saturations>88%;Compliance with respiratory medication;Verbalizes importance of monitoring SPO2 with pulse oximeter and return demonstration;Exhibits proper breathing techniques, such as pursed lip breathing or other method taught during program session;Demonstrates proper use of MDI's             Oxygen Re-Evaluation:   Oxygen Discharge (Final Oxygen Re-Evaluation):   Initial Exercise Prescription:  Initial Exercise Prescription - 10/26/22 1200       Date of Initial Exercise RX and Referring Provider   Date 10/26/22    Referring Provider [name]    Expected Discharge Date 01/19/23      Oxygen   Oxygen Continuous    Liters 2    Maintain Oxygen Saturation 88% or higher      Treadmill   MPH 1.7    Grade 0    Minutes 15      Recumbant Elliptical   Level 1    Minutes 15    METs 1.5      Prescription Details   Frequency (times per week) 2    Duration Progress to 30 minutes of continuous aerobic without signs/symptoms of physical distress      Intensity   THRR 40-80% of Max Heartrate 66-131    Ratings of Perceived Exertion 11-13    Perceived Dyspnea 0-4       Progression   Progression Continue progressive overload as per policy without signs/symptoms or physical distress.      Resistance Training   Training Prescription Yes    Weight blue bands    Reps 10-15             Perform Capillary Blood Glucose checks as needed.  Exercise Prescription Changes:   Exercise Comments:   Exercise Goals and Review:   Exercise Goals     Row Name 10/26/22 1107             Exercise Goals   Increase Physical Activity Yes       Intervention Provide advice, education, support and counseling about physical activity/exercise needs.;Develop an individualized exercise prescription for aerobic and resistive training based on initial evaluation findings, risk stratification, comorbidities and participant's personal goals.       Expected Outcomes Short Term: Attend rehab on a regular basis to increase amount of physical activity.;Long Term: Add in home exercise to make exercise part of routine and to increase amount of physical activity.;Long Term: Exercising regularly at least 3-5 days a week.       Increase Strength and Stamina Yes       Intervention Provide advice, education, support and counseling about physical activity/exercise needs.;Develop an individualized exercise prescription for aerobic and resistive training based on initial evaluation findings, risk stratification, comorbidities and participant's personal goals.       Expected Outcomes Short Term: Increase workloads from initial exercise prescription for resistance, speed, and METs.;Short Term: Perform resistance training exercises routinely during rehab and add in resistance training at home;Long Term: Improve cardiorespiratory fitness, muscular endurance and strength as measured by increased METs and functional capacity ( )       Able to understand and use rate of perceived exertion (RPE) scale Yes       Intervention Provide education and explanation on how to use RPE scale       Expected  Outcomes Short Term: Able to use RPE daily in rehab to express subjective intensity level;Long Term:  Able to use RPE to guide intensity level when exercising independently  Able to understand and use Dyspnea scale Yes       Intervention Provide education and explanation on how to use Dyspnea scale       Expected Outcomes Short Term: Able to use Dyspnea scale daily in rehab to express subjective sense of shortness of breath during exertion;Long Term: Able to use Dyspnea scale to guide intensity level when exercising independently       Knowledge and understanding of Target Heart Rate Range (THRR) Yes       Intervention Provide education and explanation of THRR including how the numbers were predicted and where they are located for reference       Expected Outcomes Short Term: Able to state/look up THRR;Long Term: Able to use THRR to govern intensity when exercising independently;Short Term: Able to use daily as guideline for intensity in rehab       Understanding of Exercise Prescription Yes       Intervention Provide education, explanation, and written materials on patient's individual exercise prescription       Expected Outcomes Short Term: Able to explain program exercise prescription;Long Term: Able to explain home exercise prescription to exercise independently                Exercise Goals Re-Evaluation :   Discharge Exercise Prescription (Final Exercise Prescription Changes):   Nutrition:  Target Goals: Understanding of nutrition guidelines, daily intake of sodium 1500mg , cholesterol 200mg , calories 30% from fat and 7% or less from saturated fats, daily to have 5 or more servings of fruits and vegetables.  Biometrics:    Nutrition Therapy Plan and Nutrition Goals:   Nutrition Assessments:  MEDIFICTS Score Key: >=70 Need to make dietary changes  40-70 Heart Healthy Diet <= 40 Therapeutic Level Cholesterol Diet  Flowsheet Row PULMONARY REHAB CHRONIC OBSTRUCTIVE  PULMONARY DISEASE from 02/22/2022 in [facility] Heart, Vascular, & Lung [facility]  Picture Your Plate Total Score on Discharge 50      Picture Your Plate Scores: <16 Unhealthy dietary pattern with much room for improvement. 41-50 Dietary pattern unlikely to meet recommendations for good health and room for improvement. 51-60 More healthful dietary pattern, with some room for improvement.  >60 Healthy dietary pattern, although there may be some specific behaviors that could be improved.    Nutrition Goals Re-Evaluation:   Nutrition Goals Discharge (Final Nutrition Goals Re-Evaluation):   Psychosocial: Target Goals: Acknowledge presence or absence of significant depression and/or stress, maximize coping skills, provide positive support system. Participant is able to verbalize types and ability to use techniques and skills needed for reducing stress and depression.  Initial Review & Psychosocial Screening:  Initial Psych Review & Screening - 10/26/22 1103       Initial Review   Current issues with Current Depression;Current Anxiety/Panic;History of Depression;Current Stress Concerns    Comments Pt is stressed about her health and the ability to not work anymore.      Family Dynamics   Good Support System? Yes    Comments Husband, Alessandra Bevels, 2 daughters      Barriers   Psychosocial barriers to participate in program Psychosocial barriers identified (see note);The patient should benefit from training in stress management and relaxation.      Screening Interventions   Interventions Encouraged to exercise;To provide support and resources with identified psychosocial needs    Expected Outcomes Short Term goal: Utilizing psychosocial counselor, staff and physician to assist with identification of specific Stressors or current issues interfering with healing  process. Setting desired goal for each stressor or current issue identified.;Long Term Goal: Stressors or  current issues are controlled or eliminated.;Short Term goal: Identification and review with participant of any Quality of Life or Depression concerns found by scoring the questionnaire.;Long Term goal: The participant improves quality of Life and PHQ9 Scores as seen by post scores and/or verbalization of changes             Quality of Life Scores:  Scores of 19 and below usually indicate a poorer quality of life in these areas.  A difference of  2-3 points is a clinically meaningful difference.  A difference of 2-3 points in the total score of the Quality of Life Index has been associated with significant improvement in overall quality of life, self-image, physical symptoms, and general health in studies assessing change in quality of life.  PHQ-9: Review Flowsheet  More data may exist      10/26/2022 02/22/2022 11/22/2021 03/22/2021 12/21/2020  Depression screen PHQ 2/9  Decreased Interest 2 1 1 1  0  Down, Depressed, Hopeless 2 0 1 1 1   PHQ - 2 Score 4 1 2 2 1   Altered sleeping 3 1 2 3  -  Tired, decreased energy 3 1 1 3  -  Change in appetite 3 1 2 1  -  Feeling bad or failure about yourself  3 2 3  0 -  Trouble concentrating 0 0 0 0 -  Moving slowly or fidgety/restless 0 0 1 0 -  Suicidal thoughts 0 0 0 0 -  PHQ-9 Score 16 6 11 9  -  Difficult doing work/chores Somewhat difficult Not difficult at all Somewhat difficult - -    Details           Interpretation of Total Score  Total Score Depression Severity:  1-4 = Minimal depression, 5-9 = Mild depression, 10-14 = Moderate depression, 15-19 = Moderately severe depression, 20-27 = Severe depression   Psychosocial Evaluation and Intervention:  Psychosocial Evaluation - 10/26/22 1105       Psychosocial Evaluation & Interventions   Interventions Stress management education;Encouraged to exercise with the program and follow exercise prescription    Comments Timea is currently stressed about her health and not being able to work  anymore. 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 She has finally been approved for disability so this has helped to decrease some of her stress. She is currently being worked up for a lung transplant at [facility]. She does admit to feeling depressed, but states she is getting ready to start therapy. She is also going to ask her therapist about starting psychotropic meds.    Expected Outcomes For Angelus to participate in PR free of any psychosocial barreris or concerns    Continue Psychosocial Services  Follow up required by staff             Psychosocial Re-Evaluation:   Psychosocial Discharge (Final Psychosocial Re-Evaluation):   Education: Education Goals: Education classes will be provided on a weekly basis, covering required topics. Participant will state understanding/return demonstration of topics presented.  Learning Barriers/Preferences:  Learning Barriers/Preferences - 10/26/22 1105       Learning Barriers/Preferences   Learning Barriers None    Learning Preferences Audio;Group Instruction;Individual Instruction;Verbal Instruction;Video;Written Material             Education Topics: Know Your Numbers Group instruction that is supported by a PowerPoint presentation. Instructor discusses importance of knowing and understanding resting, exercise, and post-exercise  oxygen saturation, heart rate, and blood pressure. Oxygen saturation, heart rate, blood pressure, rating of perceived exertion, and dyspnea are reviewed along with a normal range for these values.    Exercise for the Pulmonary Patient Group instruction that is supported by a PowerPoint presentation. Instructor discusses benefits of exercise, core components of exercise, frequency, duration, and intensity of an exercise routine, importance of utilizing pulse oximetry during exercise, safety while exercising, and options of places to exercise outside of rehab.  Flowsheet Row  PULMONARY REHAB CHRONIC OBSTRUCTIVE PULMONARY DISEASE from 12/30/2021 in [facility] for Heart, Vascular, & Lung Health  Date 12/30/21  Educator EP  Instruction Review Code 1- Verbalizes Understanding       MET Level  Group instruction provided by PowerPoint, verbal discussion, and written material to support subject matter. Instructor reviews what METs are and how to increase METs.    Pulmonary Medications Verbally interactive group education provided by instructor with focus on inhaled medications and proper administration.   Anatomy and Physiology of the Respiratory System Group instruction provided by PowerPoint, verbal discussion, and written material to support subject matter. Instructor reviews respiratory cycle and anatomical components of the respiratory system and their functions. Instructor also reviews differences in obstructive and restrictive respiratory diseases with examples of each.    Oxygen Safety Group instruction provided by PowerPoint, verbal discussion, and written material to support subject matter. There is an overview of "What is Oxygen" and "Why do we need it".  Instructor also reviews how to create a safe environment for oxygen use, the importance of using oxygen as prescribed, and the risks of noncompliance. There is a brief discussion on traveling with oxygen and resources the patient may utilize. Flowsheet Row PULMONARY REHAB CHRONIC OBSTRUCTIVE PULMONARY DISEASE from 01/27/2022 in [facility] for Heart, Vascular, & Lung Health  Date 01/27/22  Educator RN  Instruction Review Code 1- Verbalizes Understanding       Oxygen Use Group instruction provided by PowerPoint, verbal discussion, and written material to discuss how supplemental oxygen is prescribed and different types of oxygen supply systems. Resources for more information are provided.    Breathing Techniques Group instruction that is supported by  demonstration and informational handouts. Instructor discusses the benefits of pursed lip and diaphragmatic breathing and detailed demonstration on how to perform both.  Flowsheet Row PULMONARY REHAB CHRONIC OBSTRUCTIVE PULMONARY DISEASE from 02/10/2022 in [facility] for Heart, Vascular, & Lung Health  Date 02/10/22  Educator EP  Instruction Review Code 1- Verbalizes Understanding  [Handout also provided]        Risk Factor Reduction Group instruction that is supported by a PowerPoint presentation. Instructor discusses the definition of a risk factor, different risk factors for pulmonary disease, and how the heart and lungs work together. Flowsheet Row PULMONARY REHAB CHRONIC OBSTRUCTIVE PULMONARY DISEASE from 12/30/2021 in [facility] for Heart, Vascular, & Lung Health  Date 12/16/21  Educator EP  Instruction Review Code 1- Verbalizes Understanding       Pulmonary Diseases Group instruction provided by PowerPoint, verbal discussion, and written material to support subject matter. Instructor gives an overview of the different type of pulmonary diseases. There is also a discussion on risk factors and symptoms as well as ways to manage the diseases.   Stress and Energy Conservation Group instruction provided by PowerPoint, verbal discussion, and written material to support subject matter. Instructor gives an overview of stress and the impact it  can have on the body. Instructor also reviews ways to reduce stress. There is also a discussion on energy conservation and ways to conserve energy throughout the day.   Warning Signs and Symptoms Group instruction provided by PowerPoint, verbal discussion, and written material to support subject matter. Instructor reviews warning signs and symptoms of stroke, heart attack, cold and flu. Instructor also reviews ways to prevent the spread of infection.   Other Education Group or individual verbal,  written, or video instructions that support the educational goals of the pulmonary rehab program. Flowsheet Row PULMONARY REHAB CHRONIC OBSTRUCTIVE PULMONARY DISEASE from 03/01/2022 in [facility] for Heart, Vascular, & Lung Health  Date 03/01/22  Educator EP  Instruction Review Code 1- Verbalizes Understanding        Knowledge Questionnaire Score:  Knowledge Questionnaire Score - 10/26/22 1135       Knowledge Questionnaire Score   Pre Score 17/18             Core Components/Risk Factors/Patient Goals at Admission:  Personal Goals and Risk Factors at Admission - 10/26/22 1105       Core Components/Risk Factors/Patient Goals on Admission    Weight Management Yes;Weight Loss   pt wants to lose 10lbs   Intervention Weight Management: Develop a combined nutrition and exercise program designed to reach desired caloric intake, while maintaining appropriate intake of nutrient and fiber, sodium and fats, and appropriate energy expenditure required for the weight goal.;Weight Management: Provide education and appropriate resources to help participant work on and attain dietary goals.;Weight Management/Obesity: Establish reasonable short term and long term weight goals.;Obesity: Provide education and appropriate resources to help participant work on and attain dietary goals.    Expected Outcomes Short Term: Continue to assess and modify interventions until short term weight is achieved;Long Term: Adherence to nutrition and physical activity/exercise program aimed toward attainment of established weight goal;Weight Loss: Understanding of general recommendations for a balanced deficit meal plan, which promotes 1-2 lb weight loss per week and includes a negative energy balance of [phone] kcal/d;Understanding recommendations for meals to include 15-35% energy as protein, 25-35% energy from fat, 35-60% energy from carbohydrates, less than 200mg  of dietary cholesterol, 20-35 gm  of total fiber daily;Understanding of distribution of calorie intake throughout the day with the consumption of 4-5 meals/snacks    Improve shortness of breath with ADL's Yes    Intervention Provide education, individualized exercise plan and daily activity instruction to help decrease symptoms of SOB with activities of daily living.    Expected Outcomes Short Term: Improve cardiorespiratory fitness to achieve a reduction of symptoms when performing ADLs;Long Term: Be able to perform more ADLs without symptoms or delay the onset of symptoms    Stress Yes    Intervention Offer individual and/or small group education and counseling on adjustment to heart disease, stress management and health-related lifestyle change. Teach and support self-help strategies.;Refer participants experiencing significant psychosocial distress to appropriate mental health specialists for further evaluation and treatment. When possible, include family members and significant others in education/counseling sessions.    Expected Outcomes Short Term: Participant demonstrates changes in health-related behavior, relaxation and other stress management skills, ability to obtain effective social support, and compliance with psychotropic medications if prescribed.;Long Term: Emotional wellbeing is indicated by absence of clinically significant psychosocial distress or social isolation.             Core Components/Risk Factors/Patient Goals Review:    Core Components/Risk Factors/Patient Goals at Discharge (Final Review):  ITP Comments:   Comments: Dr. Mechele Collin is Medical Director for Pulmonary Rehab at [facility].

## 2022-10-26 NOTE — Progress Notes (Signed)
Jill Glenn 57 y.o. female Pulmonary Rehab Orientation Note This patient who was referred to Pulmonary Rehab by Dr. [name] with the diagnosis of COPD 3 arrived today in Cardiac and Pulmonary Rehab. She  arrived ambulatory with normal gait. She  does carry portable oxygen. Jill Glenn is the provider for their DME. Per patient, Jill Glenn uses oxygen intermittently. Color good, skin warm and dry. Patient is oriented to time and place. Patient's medical history, psychosocial health, and medications reviewed. Psychosocial assessment reveals patient lives with spouse. [name] is currently unemployed, disabled. Patient hobbies include spending time with others. Patient reports her stress level is moderate. Areas of stress/anxiety include health and work. Patient does exhibit signs of depression. Signs of depression include anxiety, helplessness, and panic and difficulty falling asleep and fatigue. PHQ2/9 score 4/16. Jill Glenn shows good  coping skills with positive outlook on life. Offered emotional support and reassurance. Will continue to monitor. Physical assessment performed by Nurse pick: Essie Hart RN. Please see their orientation physical assessment note. [name] reports she does take medications as prescribed. Patient states she follows a 1200 calorie diet per [facility]. The patient has to lose 10lbs per [facility] for lung transplant.. Patient's weight will be monitored closely. Demonstration and practice of PLB using pulse oximeter. Jill Glenn able to return demonstration satisfactorily. Safety and hand hygiene in the exercise area reviewed with patient. [name] voices understanding of the information reviewed. Department expectations discussed with patient and achievable goals were set. The patient shows enthusiasm about attending the program and we look forward to working with Jill Glenn. Alora completed a 6 min walk test today and is scheduled to begin exercise on 10/29@1015 .   [phone] Guss Bunde, BSRT

## 2022-10-29 ENCOUNTER — Telehealth: Payer: 59 | Admitting: Family Medicine

## 2022-10-29 DIAGNOSIS — K0889 Other specified disorders of teeth and supporting structures: Secondary | ICD-10-CM

## 2022-10-29 MED ORDER — IBUPROFEN 600 MG PO TABS
600.0000 mg | ORAL_TABLET | Freq: Three times a day (TID) | ORAL | 0 refills | Status: AC | PRN
Start: 2022-10-29 — End: 2022-11-05

## 2022-10-29 MED ORDER — AMOXICILLIN 500 MG PO CAPS
500.0000 mg | ORAL_CAPSULE | Freq: Two times a day (BID) | ORAL | 0 refills | Status: AC
Start: 2022-10-29 — End: 2022-11-08

## 2022-10-29 NOTE — Patient Instructions (Signed)
Lynford Humphrey [place], thank you for joining Reed Pandy, PA-C for today's virtual visit.  While this provider is not your primary care provider (PCP), if your PCP is located in our provider database this encounter information will be shared with them immediately following your visit.   A [facility] MyChart account gives you access to today's visit and all your visits, tests, and labs performed at [facility] " click here if you don't have a [facility] MyChart account or go to [email or web address]  Consent: (Patient) Jill Glenn provided verbal consent for this virtual visit at the beginning of the encounter.  Current Medications:  Current Outpatient Medications:    amoxicillin (AMOXIL) 500 MG capsule, Take 1 capsule (500 mg total) by mouth 2 (two) times daily for 10 days., Disp: 20 capsule, Rfl: 0   ibuprofen (ADVIL) 600 MG tablet, Take 1 tablet (600 mg total) by mouth every 8 (eight) hours as needed for up to 7 days., Disp: 21 tablet, Rfl: 0   acetaminophen (TYLENOL) 325 MG tablet, Take 2 tablets (650 mg total) by mouth every 6 (six) hours as needed for moderate pain, mild pain or headache., Disp: , Rfl:    albuterol (PROVENTIL) (2.5 MG/3ML) 0.083% nebulizer solution, Take 3 mLs (2.5 mg total) by nebulization every 6 (six) hours as needed for wheezing or shortness of breath., Disp: 75 mL, Rfl: 12   albuterol (VENTOLIN HFA) 108 (90 Base) MCG/ACT inhaler, Inhale 2 puffs into the lungs every 6 (six) hours as needed for wheezing or shortness of breath., Disp: 18 g, Rfl: 3   Ascorbic Acid (VITAMIN C GUMMIES PO), Take 500 mg by mouth daily., Disp: , Rfl:    budesonide (PULMICORT) 0.5 MG/2ML nebulizer solution, Take 2 mLs (0.5 mg total) by nebulization in the morning and at bedtime., Disp: 60 mL, Rfl: 11   Cranberry 500 MG TABS, Take 500 mg by mouth daily as needed (for UTI prevention)., Disp: , Rfl:    Glycopyrrolate-Formoterol (BEVESPI AEROSPHERE) 9-4.8 MCG/ACT AERO, Inhale  2 puffs into the lungs 2 (two) times daily., Disp: 1 each, Rfl: 11   levocetirizine (XYZAL) 5 MG tablet, Take 1 tablet (5 mg total) by mouth every evening., Disp: 30 tablet, Rfl: 11   montelukast (SINGULAIR) 10 MG tablet, Take 1 tablet (10 mg total) by mouth at bedtime., Disp: 30 tablet, Rfl: 11   Multiple Vitamin (MULTIVITAMIN ADULT) TABS, Take 1 tablet by mouth daily with breakfast., Disp: , Rfl:    rizatriptan (MAXALT) 10 MG tablet, Take 1 tablet (10 mg total) by mouth once as needed for migraine. May repeat in 2 hours if needed, Disp: 30 tablet, Rfl: 2   Ubrogepant (UBRELVY) 100 MG TABS, Take one tablet onset of Migraine May take another tablet 2 hours later, Don't exceed two tablets daily, Disp: 16 tablet, Rfl: 11   varenicline (CHANTIX) 1 MG tablet, Take 1 mg by mouth daily., Disp: , Rfl:    Medications ordered in this encounter:  Meds ordered this encounter  Medications   amoxicillin (AMOXIL) 500 MG capsule    Sig: Take 1 capsule (500 mg total) by mouth 2 (two) times daily for 10 days.    Dispense:  20 capsule    Refill:  0   ibuprofen (ADVIL) 600 MG tablet    Sig: Take 1 tablet (600 mg total) by mouth every 8 (eight) hours as needed for up to 7 days.    Dispense:  21 tablet    Refill:  0     *  If you need refills on other medications prior to your next appointment, please contact your pharmacy*  Follow-Up: Call back or seek an in-person evaluation if the symptoms worsen or if the condition fails to improve as anticipated.  [facility] [phone]  Other Instructions Dental Pain Dental pain is often a sign that something is wrong with your teeth or gums. You can also have pain after a dental treatment. If you have dental pain, it is important to contact your dentist, especially if the cause of the pain is not known. Dental pain may hurt a lot or a little and can be caused by many things, including: Tooth decay (cavities or caries). Infection. The inner part of  the tooth being filled with pus (an abscess). Injury. A crack in the tooth. Gums that move back and expose the root of a tooth. Gum disease. Abnormal grinding or clenching of teeth. Not taking good care of your teeth. Sometimes the cause of pain is not known. You may have pain all the time, or it may happen only when you are: Chewing. Exposed to hot or cold temperatures. Eating or drinking foods or drinks that have a lot of sugar in them, such as soda or candy. Follow these instructions at home: Medicines Take over-the-counter and prescription medicines only as told by your dentist. If you were prescribed an antibiotic medicine, take it as told by your dentist. Do not stop taking it even if you start to feel better. Eating and drinking Do not eat foods or drinks that cause you pain. These include: Very hot or very cold foods or drinks. Sweet or sugary foods or drinks. Managing pain and swelling  If told, put ice on the painful area of your face. To do this: Put ice in a plastic bag. Place a towel between your skin and the bag. Leave the ice on for 20 minutes, 2-3 times a day. Take off the ice if your skin turns bright red. This is very important. If you cannot feel pain, heat, or cold, you have a greater risk of damage to the area. Brushing your teeth Brush your teeth twice a day using a fluoride toothpaste. Use a toothpaste made for sensitive teeth as told by your dentist. Use a soft toothbrush. General instructions Floss your teeth at least once a day. Do not put heat on the outside of your face. Rinse your mouth often with salt water. To make salt water, dissolve -1 tsp (3-6 g) of salt in 1 cup (237 mL) of warm water. Watch your dental pain. Let your dentist know if there are any changes. Keep all follow-up visits. Contact a dentist if: You have dental pain and you do not know why. Medicine does not help your pain. Your symptoms get worse. You have new symptoms. Get help  right away if: You cannot open your mouth. You are having trouble breathing or swallowing. You have a fever. Your face, neck, or jaw is swollen. These symptoms may be an emergency. Get help right away. Call your local emergency services (911 in the [place] Do not wait to see if the symptoms will go away. Do not drive yourself to the hospital. Summary Dental pain may be caused by many things, including tooth decay, injury, or infection. In some cases, the cause is not known. Dental pain may hurt a lot or very little. You may have pain all the time, or you may have it only when you eat or drink. Take over-the-counter  and prescription medicines only as told by your dentist. Watch your dental pain for any changes. Let your dentist know if symptoms get worse. This information is not intended to replace advice given to you by your health care provider. Make sure you discuss any questions you have with your health care provider. Document Revised: 09/25/2019 Document Reviewed: 09/25/2019 Elsevier Patient Education  2024 Elsevier Inc.    If you have been instructed to have an in-person evaluation today at a local Urgent Care facility, please use the link below. It will take you to a list of all of our available [facility], including address, phone number and hours of operation. Please do not delay care.  [facility]  If you or a family member do not have a primary care provider, use the link below to schedule a visit and establish care. When you choose a [facility] primary care physician or advanced practice provider, you gain a long-term partner in health. Find a Primary Care Provider  Learn more about [facility] in-office and virtual care options: [facility] - Get Care Now

## 2022-10-29 NOTE — Progress Notes (Signed)
Virtual Visit Consent   Jill Glenn, you are scheduled for a virtual visit with a [facility] provider today. Just as with appointments in the office, your consent must be obtained to participate. Your consent will be active for this visit and any virtual visit you may have with one of our providers in the next 365 days. If you have a MyChart account, a copy of this consent can be sent to you electronically.  As this is a virtual visit, video technology does not allow for your provider to perform a traditional examination. This may limit your provider's ability to fully assess your condition. If your provider identifies any concerns that need to be evaluated in person or the need to arrange testing (such as labs, EKG, etc.), we will make arrangements to do so. Although advances in technology are sophisticated, we cannot ensure that it will always work on either your end or our end. If the connection with a video visit is poor, the visit may have to be switched to a telephone visit. With either a video or telephone visit, we are not always able to ensure that we have a secure connection.  By engaging in this virtual visit, you consent to the provision of healthcare and authorize for your insurance to be billed (if applicable) for the services provided during this visit. Depending on your insurance coverage, you may receive a charge related to this service.  I need to obtain your verbal consent now. Are you willing to proceed with your visit today? [name] has provided verbal consent on 10/29/2022 for a virtual visit (video or telephone). Jill Glenn, [place]  Date: 10/29/2022 4:16 PM  Virtual Visit via Video Note   I, Jill Glenn, connected with  ALEXINA KERESTES  ([id number], Sep 18, 1965) on 10/29/22 at  4:15 PM EDT by a video-enabled telemedicine application and verified that I am speaking with the correct person using two identifiers.  Location: Patient: Virtual Visit Location  Patient: Home Provider: Virtual Visit Location Provider: Home Office   I discussed the limitations of evaluation and management by telemedicine and the availability of in person appointments. The patient expressed understanding and agreed to proceed.    History of Present Illness: Jill Glenn is a 57 y.o. who identifies as a female who was assigned female at birth, and is being seen today for c/o I have a tooth infection.  Pt states she cannot get in to see her dentist until Nov. 13th.  Pt states she has been taking Tylenol for pain. Pt does not report other symptoms.   HPI: HPI  Problems:  Patient Active Problem List   Diagnosis Date Noted   Chronic migraine without aura, with intractable migraine, so stated, with status migrainosus 06/21/2022   Uncontrolled morning headache 06/21/2022   Nocturnal hypoxia 06/21/2022   Migraine without aura and without status migrainosus, not intractable 06/21/2022   Prediabetes 04/12/2022   Dyslipidemia, goal LDL below 130 04/12/2022   Visit for screening mammogram 04/12/2022   COPD, very severe (HCC) 11/04/2021   Mixed simple and mucopurulent chronic bronchitis (HCC) 12/21/2020   Panlobular emphysema (HCC) 12/21/2020   Encounter for general adult medical examination with abnormal findings 12/21/2020   Need for vaccination 12/21/2020   Screen for colon cancer 12/21/2020   Colon cancer screening 12/21/2020   Tobacco use disorder     Allergies:  Allergies  Allergen Reactions   Codeine Nausea And Vomiting, Palpitations and Other (See Comments)    Cold sweats,  also   Medications:  Current Outpatient Medications:    amoxicillin (AMOXIL) 500 MG capsule, Take 1 capsule (500 mg total) by mouth 2 (two) times daily for 10 days., Disp: 20 capsule, Rfl: 0   ibuprofen (ADVIL) 600 MG tablet, Take 1 tablet (600 mg total) by mouth every 8 (eight) hours as needed for up to 7 days., Disp: 21 tablet, Rfl: 0   acetaminophen (TYLENOL) 325 MG tablet, Take 2  tablets (650 mg total) by mouth every 6 (six) hours as needed for moderate pain, mild pain or headache., Disp: , Rfl:    albuterol (PROVENTIL) (2.5 MG/3ML) 0.083% nebulizer solution, Take 3 mLs (2.5 mg total) by nebulization every 6 (six) hours as needed for wheezing or shortness of breath., Disp: 75 mL, Rfl: 12   albuterol (VENTOLIN HFA) 108 (90 Base) MCG/ACT inhaler, Inhale 2 puffs into the lungs every 6 (six) hours as needed for wheezing or shortness of breath., Disp: 18 g, Rfl: 3   Ascorbic Acid (VITAMIN C GUMMIES PO), Take 500 mg by mouth daily., Disp: , Rfl:    budesonide (PULMICORT) 0.5 MG/2ML nebulizer solution, Take 2 mLs (0.5 mg total) by nebulization in the morning and at bedtime., Disp: 60 mL, Rfl: 11   Cranberry 500 MG TABS, Take 500 mg by mouth daily as needed (for UTI prevention)., Disp: , Rfl:    Glycopyrrolate-Formoterol (BEVESPI AEROSPHERE) 9-4.8 MCG/ACT AERO, Inhale 2 puffs into the lungs 2 (two) times daily., Disp: 1 each, Rfl: 11   levocetirizine (XYZAL) 5 MG tablet, Take 1 tablet (5 mg total) by mouth every evening., Disp: 30 tablet, Rfl: 11   montelukast (SINGULAIR) 10 MG tablet, Take 1 tablet (10 mg total) by mouth at bedtime., Disp: 30 tablet, Rfl: 11   Multiple Vitamin (MULTIVITAMIN ADULT) TABS, Take 1 tablet by mouth daily with breakfast., Disp: , Rfl:    rizatriptan (MAXALT) 10 MG tablet, Take 1 tablet (10 mg total) by mouth once as needed for migraine. May repeat in 2 hours if needed, Disp: 30 tablet, Rfl: 2   Ubrogepant (UBRELVY) 100 MG TABS, Take one tablet onset of Migraine May take another tablet 2 hours later, Don't exceed two tablets daily, Disp: 16 tablet, Rfl: 11   varenicline (CHANTIX) 1 MG tablet, Take 1 mg by mouth daily., Disp: , Rfl:   Observations/Objective: Patient is well-developed, well-nourished in no acute distress.  Resting comfortably at home.  Head is normocephalic, atraumatic.  No labored breathing.  Speech is clear and coherent with logical  content.  Patient is alert and oriented at baseline.    Assessment and Plan: 1. Tooth pain - amoxicillin (AMOXIL) 500 MG capsule; Take 1 capsule (500 mg total) by mouth 2 (two) times daily for 10 days.  Dispense: 20 capsule; Refill: 0 - ibuprofen (ADVIL) 600 MG tablet; Take 1 tablet (600 mg total) by mouth every 8 (eight) hours as needed for up to 7 days.  Dispense: 21 tablet; Refill: 0  -Start Amoxicillin and Ibuprofen  -Advised Pt to F/U with dentist -Pt verbalized understanding   Follow Up Instructions: I discussed the assessment and treatment plan with the patient. The patient was provided an opportunity to ask questions and all were answered. The patient agreed with the plan and demonstrated an understanding of the instructions.  A copy of instructions were sent to the patient via MyChart unless otherwise noted below.     The patient was advised to call back or seek an in-person evaluation if the symptoms worsen or  if the condition fails to improve as anticipated.    Jill Pandy, PA-C

## 2022-11-01 ENCOUNTER — Encounter (HOSPITAL_COMMUNITY)
Admission: RE | Admit: 2022-11-01 | Discharge: 2022-11-01 | Disposition: A | Discharge status: Home / Self Care | Payer: 59 | Source: Ambulatory Visit | Attending: Internal Medicine | Admitting: Internal Medicine

## 2022-11-01 VITALS — Wt 178.1 lb

## 2022-11-01 DIAGNOSIS — J449 Chronic obstructive pulmonary disease, unspecified: Secondary | ICD-10-CM

## 2022-11-01 NOTE — Progress Notes (Signed)
Daily Session Note  Patient Details  Name: Jill Glenn MRN: [id number] Date of Birth: 1965/02/20 Referring Provider:   Doristine Devoid Pulmonary Rehab Walk Test from 10/26/2022 in [facility]  Referring Provider [name]       Encounter Date: 11/01/2022  Check In:  Session Check In - 11/01/22 1105       Check-In   Supervising physician immediately available to respond to emergencies [facility] MD immediately available    Physician(s) Robin Searing, NP    Location [facility]    Staff Present Raford Pitcher, MS, ACSM-CEP, Exercise Physiologist;Mary Gerre Scull, RN, BSN;Casey Hermine Messick Belarus, RD, LDN;Harriett Sine, RN, MHA    Virtual Visit No    Medication changes reported     No    Fall or balance concerns reported    No    Tobacco Cessation No Change    Warm-up and Cool-down Performed as group-led instruction    Resistance Training Performed Yes    VAD Patient? No    PAD/SET Patient? No      Pain Assessment   Currently in Pain? No/denies    Multiple Pain Sites No             Capillary Blood Glucose: No results found for this or any previous visit (from the past 24 hour(s)).   Exercise Prescription Changes - 11/01/22 1200       Response to Exercise   Blood Pressure (Admit) 110/77    Blood Pressure (Exercise) 109/83    Blood Pressure (Exit) 97/67    Heart Rate (Admit) 77 bpm    Heart Rate (Exercise) 104 bpm    Heart Rate (Exit) 83 bpm    Oxygen Saturation (Admit) 99 %    Oxygen Saturation (Exercise) 96 %    Oxygen Saturation (Exit) 97 %    Rating of Perceived Exertion (Exercise) 13    Perceived Dyspnea (Exercise) 2    Duration Continue with 30 min of aerobic exercise without signs/symptoms of physical distress.    Intensity THRR unchanged      Progression   Progression Continue to progress workloads to maintain intensity without signs/symptoms of physical distress.       Resistance Training   Training Prescription Yes    Weight blue bands    Reps 10-15    Time 10 Minutes      Oxygen   Oxygen Continuous    Liters 2      Treadmill   MPH 1    Grade 0    Minutes 15    METs 1.6      Recumbant Elliptical   Level 1    Minutes 15    METs 2.1      Oxygen   Maintain Oxygen Saturation 88% or higher             Social History   Tobacco Use  Smoking Status Former   Current packs/day: 0.00   Average packs/day: 1 pack/day for 40.6 years (40.6 ttl pk-yrs)   Types: Cigarettes   Start date: 03/05/1981   Quit date: 10/14/2021   Years since quitting: 1.0  Smokeless Tobacco Never    Goals Met:  Exercise tolerated well No report of concerns or symptoms today Strength training completed today  Goals Unmet:  Not Applicable  Comments: Service time is from 1013 to 1136.  Dr. Mechele Collin is Medical Director for Pulmonary Rehab at [facility]

## 2022-11-02 DIAGNOSIS — J432 Centrilobular emphysema: Secondary | ICD-10-CM | POA: Diagnosis not present

## 2022-11-02 DIAGNOSIS — J449 Chronic obstructive pulmonary disease, unspecified: Secondary | ICD-10-CM | POA: Diagnosis not present

## 2022-11-02 DIAGNOSIS — R0602 Shortness of breath: Secondary | ICD-10-CM | POA: Diagnosis not present

## 2022-11-02 NOTE — Progress Notes (Signed)
Pulmonary Individual Treatment Plan  Patient Details  Name: Jill Glenn MRN: [id number] Date of Birth: 06-Nov-1965 Referring Provider:   Doristine Devoid Pulmonary Rehab Walk Test from 10/26/2022 in [facility]  Referring Provider [name]       Initial Encounter Date:  Flowsheet Row Pulmonary Rehab Walk Test from 10/26/2022 in [facility]  Date 10/26/22       Visit Diagnosis: Stage 3 severe COPD by GOLD classification (HCC)  Patient's Home Medications on Admission:   Current Outpatient Medications:    acetaminophen (TYLENOL) 325 MG tablet, Take 2 tablets (650 mg total) by mouth every 6 (six) hours as needed for moderate pain, mild pain or headache., Disp: , Rfl:    albuterol (PROVENTIL) (2.5 MG/3ML) 0.083% nebulizer solution, Take 3 mLs (2.5 mg total) by nebulization every 6 (six) hours as needed for wheezing or shortness of breath., Disp: 75 mL, Rfl: 12   albuterol (VENTOLIN HFA) 108 (90 Base) MCG/ACT inhaler, Inhale 2 puffs into the lungs every 6 (six) hours as needed for wheezing or shortness of breath., Disp: 18 g, Rfl: 3   amoxicillin (AMOXIL) 500 MG capsule, Take 1 capsule (500 mg total) by mouth 2 (two) times daily for 10 days., Disp: 20 capsule, Rfl: 0   Ascorbic Acid (VITAMIN C GUMMIES PO), Take 500 mg by mouth daily., Disp: , Rfl:    budesonide (PULMICORT) 0.5 MG/2ML nebulizer solution, Take 2 mLs (0.5 mg total) by nebulization in the morning and at bedtime., Disp: 60 mL, Rfl: 11   Cranberry 500 MG TABS, Take 500 mg by mouth daily as needed (for UTI prevention)., Disp: , Rfl:    Glycopyrrolate-Formoterol (BEVESPI AEROSPHERE) 9-4.8 MCG/ACT AERO, Inhale 2 puffs into the lungs 2 (two) times daily., Disp: 1 each, Rfl: 11   ibuprofen (ADVIL) 600 MG tablet, Take 1 tablet (600 mg total) by mouth every 8 (eight) hours as needed for up to 7 days., Disp: 21 tablet,  Rfl: 0   levocetirizine (XYZAL) 5 MG tablet, Take 1 tablet (5 mg total) by mouth every evening., Disp: 30 tablet, Rfl: 11   montelukast (SINGULAIR) 10 MG tablet, Take 1 tablet (10 mg total) by mouth at bedtime., Disp: 30 tablet, Rfl: 11   Multiple Vitamin (MULTIVITAMIN ADULT) TABS, Take 1 tablet by mouth daily with breakfast., Disp: , Rfl:    rizatriptan (MAXALT) 10 MG tablet, Take 1 tablet (10 mg total) by mouth once as needed for migraine. May repeat in 2 hours if needed, Disp: 30 tablet, Rfl: 2   Ubrogepant (UBRELVY) 100 MG TABS, Take one tablet onset of Migraine May take another tablet 2 hours later, Don't exceed two tablets daily, Disp: 16 tablet, Rfl: 11   varenicline (CHANTIX) 1 MG tablet, Take 1 mg by mouth daily., Disp: , Rfl:   Past Medical History: Past Medical History:  Diagnosis Date   Allergy    seasonal   Anxiety    Asthma    COPD (chronic obstructive pulmonary disease) (HCC)    Emphysema of lung (HCC)     Tobacco Use: Social History   Tobacco Use  Smoking Status Former   Current packs/day: 0.00   Average packs/day: 1 pack/day for 40.6 years (40.6 ttl pk-yrs)   Types: Cigarettes   Start date: 03/05/1981   Quit date: 10/14/2021   Years since quitting: 1.0  Smokeless Tobacco Never    Labs: Review Flowsheet  Latest Ref Rng & Units 11/02/2020 12/21/2020 03/24/2022 04/12/2022  Labs for ITP Cardiac and Pulmonary Rehab  Cholestrol 0 - 200 mg/dL - 132  - 440   LDL (calc) 0 - 99 mg/dL - 91  - 102   HDL-C >72.53 mg/dL - 66.44  - 03.47   Trlycerides 0.0 - 149.0 mg/dL - 42.5  - 956.3   Hemoglobin A1c 4.8 - 5.6 % - - 6.0  -  Bicarbonate 20.0 - 28.0 mmol/L 22.2  - - -  TCO2 22 - 32 mmol/L 23  - - -  Acid-base deficit 0.0 - 2.0 mmol/L 3.0  - - -  O2 Saturation % 90.0  - - -    Details            Capillary Blood Glucose: Lab Results  Component Value Date   GLUCAP 73 03/26/2022   GLUCAP 148 (H) 03/25/2022   GLUCAP 166 (H) 03/25/2022   GLUCAP 88 03/25/2022    GLUCAP 84 03/25/2022     Pulmonary Assessment Scores:  Pulmonary Assessment Scores     Row Name 10/26/22 1135         ADL UCSD   ADL Phase Entry     SOB Score total 54       CAT Score   CAT Score 16       mMRC Score   mMRC Score 4             UCSD: Self-administered rating of dyspnea associated with activities of daily living (ADLs) 6-point scale (0 = "not at all" to 5 = "maximal or unable to do because of breathlessness")  Scoring Scores range from 0 to 120.  Minimally important difference is 5 units  CAT: CAT can identify the health impairment of COPD patients and is better correlated with disease progression.  CAT has a scoring range of zero to 40. The CAT score is classified into four groups of low (less than 10), medium (10 - 20), high (21-30) and very high (31-40) based on the impact level of disease on health status. A CAT score over 10 suggests significant symptoms.  A worsening CAT score could be explained by an exacerbation, poor medication adherence, poor inhaler technique, or progression of COPD or comorbid conditions.  CAT MCID is 2 points  mMRC: mMRC (Modified Medical Research Council) Dyspnea Scale is used to assess the degree of baseline functional disability in patients of respiratory disease due to dyspnea. No minimal important difference is established. A decrease in score of 1 point or greater is considered a positive change.   Pulmonary Function Assessment:  Pulmonary Function Assessment - 10/26/22 1109       Breath   Bilateral Breath Sounds Clear;Decreased    Shortness of Breath Limiting activity;Panic with Shortness of Breath   only panics if she doesn't have her inhaler with her            Exercise Target Goals: Exercise Program Goal: Individual exercise prescription set using results from initial 6 min walk test and THRR while considering  patient's activity barriers and safety.   Exercise Prescription Goal: Initial exercise  prescription builds to 30-45 minutes a day of aerobic activity, 2-3 days per week.  Home exercise guidelines will be given to patient during program as part of exercise prescription that the participant will acknowledge.  Activity Barriers & Risk Stratification:  Activity Barriers & Cardiac Risk Stratification - 10/26/22 1107       Activity Barriers & Cardiac Risk  Stratification   Activity Barriers Deconditioning;Muscular Weakness;Shortness of Breath;Arthritis;Back Problems    Cardiac Risk Stratification Moderate             6 Minute Walk:  6 Minute Walk     Row Name 10/26/22 1201         6 Minute Walk   Phase Initial     Distance 1125 feet     Walk Time 6 minutes     # of Rest Breaks 1  2:12-3:00     MPH 2.13     METS 2.98     RPE 14.5     Perceived Dyspnea  3     VO2 Peak 10.43     Symptoms No     Resting HR 74 bpm     Resting BP 106/70     Resting Oxygen Saturation  95 %     Exercise Oxygen Saturation  during 6 min walk 84 %     Max Ex. HR 107 bpm     Max Ex. BP 110/70     2 Minute Post BP 106/60       Interval HR   1 Minute HR 83     2 Minute HR 85     3 Minute HR 90     4 Minute HR 89     5 Minute HR 107     6 Minute HR 98     2 Minute Post HR 76     Interval Heart Rate? Yes       Interval Oxygen   Interval Oxygen? Yes     Baseline Oxygen Saturation % 95 %     1 Minute Oxygen Saturation % 97 %     1 Minute Liters of Oxygen 0 L     2 Minute Oxygen Saturation % 89 %     2 Minute Liters of Oxygen 0 L     3 Minute Oxygen Saturation % 94 %  2:12 84%     3 Minute Liters of Oxygen 0 L  increased to 2L     4 Minute Oxygen Saturation % 92 %     4 Minute Liters of Oxygen 2 L     5 Minute Oxygen Saturation % 92 %     5 Minute Liters of Oxygen 2 L     6 Minute Oxygen Saturation % 89 %     6 Minute Liters of Oxygen 2 L     2 Minute Post Oxygen Saturation % 97 %     2 Minute Post Liters of Oxygen 2 L              Oxygen Initial Assessment:  Oxygen  Initial Assessment - 10/26/22 1108       Home Oxygen   Home Oxygen Device Home Concentrator;Portable Concentrator;E-Tanks    Sleep Oxygen Prescription Continuous    Liters per minute 2    Home Exercise Oxygen Prescription Pulsed    Liters per minute 2    Home Resting Oxygen Prescription None    Compliance with Home Oxygen Use Yes      Initial 6 min Walk   Oxygen Used Continuous    Liters per minute 2      Program Oxygen Prescription   Program Oxygen Prescription Continuous    Liters per minute 2      Intervention   Short Term Goals To learn and exhibit compliance with exercise, home and travel O2 prescription;To learn and understand  importance of monitoring SPO2 with pulse oximeter and demonstrate accurate use of the pulse oximeter.;To learn and understand importance of maintaining oxygen saturations>88%;To learn and demonstrate proper pursed lip breathing techniques or other breathing techniques. ;To learn and demonstrate proper use of respiratory medications    Long  Term Goals Exhibits compliance with exercise, home  and travel O2 prescription;Maintenance of O2 saturations>88%;Compliance with respiratory medication;Verbalizes importance of monitoring SPO2 with pulse oximeter and return demonstration;Exhibits proper breathing techniques, such as pursed lip breathing or other method taught during program session;Demonstrates proper use of MDI's             Oxygen Re-Evaluation:  Oxygen Re-Evaluation     Row Name 10/31/22 0848             Program Oxygen Prescription   Program Oxygen Prescription Continuous       Liters per minute 2         Home Oxygen   Home Oxygen Device Home Concentrator;Portable Concentrator;E-Tanks       Sleep Oxygen Prescription Continuous       Liters per minute 2       Home Exercise Oxygen Prescription Pulsed       Liters per minute 2       Home Resting Oxygen Prescription None       Compliance with Home Oxygen Use Yes         Goals/Expected  Outcomes   Short Term Goals To learn and exhibit compliance with exercise, home and travel O2 prescription;To learn and understand importance of monitoring SPO2 with pulse oximeter and demonstrate accurate use of the pulse oximeter.;To learn and understand importance of maintaining oxygen saturations>88%;To learn and demonstrate proper pursed lip breathing techniques or other breathing techniques. ;To learn and demonstrate proper use of respiratory medications       Long  Term Goals Exhibits compliance with exercise, home  and travel O2 prescription;Maintenance of O2 saturations>88%;Compliance with respiratory medication;Verbalizes importance of monitoring SPO2 with pulse oximeter and return demonstration;Exhibits proper breathing techniques, such as pursed lip breathing or other method taught during program session;Demonstrates proper use of MDI's       Goals/Expected Outcomes Compliance and understanding of oxygen saturations monitoring and breathing techniques to decrease shortness of breath.                Oxygen Discharge (Final Oxygen Re-Evaluation):  Oxygen Re-Evaluation - 10/31/22 0848       Program Oxygen Prescription   Program Oxygen Prescription Continuous    Liters per minute 2      Home Oxygen   Home Oxygen Device Home Concentrator;Portable Concentrator;E-Tanks    Sleep Oxygen Prescription Continuous    Liters per minute 2    Home Exercise Oxygen Prescription Pulsed    Liters per minute 2    Home Resting Oxygen Prescription None    Compliance with Home Oxygen Use Yes      Goals/Expected Outcomes   Short Term Goals To learn and exhibit compliance with exercise, home and travel O2 prescription;To learn and understand importance of monitoring SPO2 with pulse oximeter and demonstrate accurate use of the pulse oximeter.;To learn and understand importance of maintaining oxygen saturations>88%;To learn and demonstrate proper pursed lip breathing techniques or other breathing  techniques. ;To learn and demonstrate proper use of respiratory medications    Long  Term Goals Exhibits compliance with exercise, home  and travel O2 prescription;Maintenance of O2 saturations>88%;Compliance with respiratory medication;Verbalizes importance of monitoring SPO2 with pulse oximeter and return  demonstration;Exhibits proper breathing techniques, such as pursed lip breathing or other method taught during program session;Demonstrates proper use of MDI's    Goals/Expected Outcomes Compliance and understanding of oxygen saturations monitoring and breathing techniques to decrease shortness of breath.             Initial Exercise Prescription:  Initial Exercise Prescription - 10/26/22 1200       Date of Initial Exercise RX and Referring Provider   Date 10/26/22    Referring Provider [name]    Expected Discharge Date 01/19/23      Oxygen   Oxygen Continuous    Liters 2    Maintain Oxygen Saturation 88% or higher      Treadmill   MPH 1.7    Grade 0    Minutes 15      Recumbant Elliptical   Level 1    Minutes 15    METs 1.5      Prescription Details   Frequency (times per week) 2    Duration Progress to 30 minutes of continuous aerobic without signs/symptoms of physical distress      Intensity   THRR 40-80% of Max Heartrate 66-131    Ratings of Perceived Exertion 11-13    Perceived Dyspnea 0-4      Progression   Progression Continue progressive overload as per policy without signs/symptoms or physical distress.      Resistance Training   Training Prescription Yes    Weight blue bands    Reps 10-15             Perform Capillary Blood Glucose checks as needed.  Exercise Prescription Changes:   Exercise Prescription Changes     Row Name 11/01/22 1200             Response to Exercise   Blood Pressure (Admit) 110/77       Blood Pressure (Exercise) 109/83       Blood Pressure (Exit) 97/67       Heart Rate (Admit) 77 bpm       Heart Rate  (Exercise) 104 bpm       Heart Rate (Exit) 83 bpm       Oxygen Saturation (Admit) 99 %       Oxygen Saturation (Exercise) 96 %       Oxygen Saturation (Exit) 97 %       Rating of Perceived Exertion (Exercise) 13       Perceived Dyspnea (Exercise) 2       Duration Continue with 30 min of aerobic exercise without signs/symptoms of physical distress.       Intensity THRR unchanged         Progression   Progression Continue to progress workloads to maintain intensity without signs/symptoms of physical distress.         Resistance Training   Training Prescription Yes       Weight blue bands       Reps 10-15       Time 10 Minutes         Oxygen   Oxygen Continuous       Liters 2         Treadmill   MPH 1       Grade 0       Minutes 15       METs 1.6         Recumbant Elliptical   Level 1       Minutes 15  METs 2.1         Oxygen   Maintain Oxygen Saturation 88% or higher                Exercise Comments:   Exercise Comments     Row Name 11/01/22 1208           Exercise Comments Pt completed 1st day of exercise. She exercised for 15 min on the treadmill and recumbent elliptical. She averaged 1.6 METs at 1 mph and 0 incline on the treadmill and 2.1 METs at level 1 on the recumbent elliptical. Jerita performed the warmup and cooldown standing without limitations. Kalissa did not tolerate the treadmill well and will be transfered to the track. Pt understands METs from previous participating.                Exercise Goals and Review:   Exercise Goals     Row Name 10/26/22 1107             Exercise Goals   Increase Physical Activity Yes       Intervention Provide advice, education, support and counseling about physical activity/exercise needs.;Develop an individualized exercise prescription for aerobic and resistive training based on initial evaluation findings, risk stratification, comorbidities and participant's personal goals.       Expected  Outcomes Short Term: Attend rehab on a regular basis to increase amount of physical activity.;Long Term: Add in home exercise to make exercise part of routine and to increase amount of physical activity.;Long Term: Exercising regularly at least 3-5 days a week.       Increase Strength and Stamina Yes       Intervention Provide advice, education, support and counseling about physical activity/exercise needs.;Develop an individualized exercise prescription for aerobic and resistive training based on initial evaluation findings, risk stratification, comorbidities and participant's personal goals.       Expected Outcomes Short Term: Increase workloads from initial exercise prescription for resistance, speed, and METs.;Short Term: Perform resistance training exercises routinely during rehab and add in resistance training at home;Long Term: Improve cardiorespiratory fitness, muscular endurance and strength as measured by increased METs and functional capacity ( )       Able to understand and use rate of perceived exertion (RPE) scale Yes       Intervention Provide education and explanation on how to use RPE scale       Expected Outcomes Short Term: Able to use RPE daily in rehab to express subjective intensity level;Long Term:  Able to use RPE to guide intensity level when exercising independently       Able to understand and use Dyspnea scale Yes       Intervention Provide education and explanation on how to use Dyspnea scale       Expected Outcomes Short Term: Able to use Dyspnea scale daily in rehab to express subjective sense of shortness of breath during exertion;Long Term: Able to use Dyspnea scale to guide intensity level when exercising independently       Knowledge and understanding of Target Heart Rate Range (THRR) Yes       Intervention Provide education and explanation of THRR including how the numbers were predicted and where they are located for reference       Expected Outcomes Short Term:  Able to state/look up THRR;Long Term: Able to use THRR to govern intensity when exercising independently;Short Term: Able to use daily as guideline for intensity in rehab       Understanding of  Exercise Prescription Yes       Intervention Provide education, explanation, and written materials on patient's individual exercise prescription       Expected Outcomes Short Term: Able to explain program exercise prescription;Long Term: Able to explain home exercise prescription to exercise independently                Exercise Goals Re-Evaluation :  Exercise Goals Re-Evaluation     Row Name 10/31/22 0844             Exercise Goal Re-Evaluation   Exercise Goals Review Increase Physical Activity;Able to understand and use Dyspnea scale;Understanding of Exercise Prescription;Increase Strength and Stamina;Knowledge and understanding of Target Heart Rate Range (THRR);Able to understand and use rate of perceived exertion (RPE) scale       Comments Shakonda is scheduled to begin Pulmonary Rehab again on 10/29. Will continue to monitor and progress as able.       Expected Outcomes Through exercise at rehab and home, the patient will decrease shortness of breath with daily activities and feel confident in carrying out an exercise regimen at home.                Discharge Exercise Prescription (Final Exercise Prescription Changes):  Exercise Prescription Changes - 11/01/22 1200       Response to Exercise   Blood Pressure (Admit) 110/77    Blood Pressure (Exercise) 109/83    Blood Pressure (Exit) 97/67    Heart Rate (Admit) 77 bpm    Heart Rate (Exercise) 104 bpm    Heart Rate (Exit) 83 bpm    Oxygen Saturation (Admit) 99 %    Oxygen Saturation (Exercise) 96 %    Oxygen Saturation (Exit) 97 %    Rating of Perceived Exertion (Exercise) 13    Perceived Dyspnea (Exercise) 2    Duration Continue with 30 min of aerobic exercise without signs/symptoms of physical distress.    Intensity THRR  unchanged      Progression   Progression Continue to progress workloads to maintain intensity without signs/symptoms of physical distress.      Resistance Training   Training Prescription Yes    Weight blue bands    Reps 10-15    Time 10 Minutes      Oxygen   Oxygen Continuous    Liters 2      Treadmill   MPH 1    Grade 0    Minutes 15    METs 1.6      Recumbant Elliptical   Level 1    Minutes 15    METs 2.1      Oxygen   Maintain Oxygen Saturation 88% or higher             Nutrition:  Target Goals: Understanding of nutrition guidelines, daily intake of sodium 1500mg , cholesterol 200mg , calories 30% from fat and 7% or less from saturated fats, daily to have 5 or more servings of fruits and vegetables.  Biometrics:    Nutrition Therapy Plan and Nutrition Goals:  Nutrition Therapy & Goals - 11/01/22 1307       Nutrition Therapy   Diet General Healthy Diet      Personal Nutrition Goals   Nutrition Goal Patient to improve dietary quality by using the plate method as a daily guide for meal planning to include lean protein/plant protein, fruits, vegetables, whole grains, and nonfat/low fat dairy as part of well balanced diet    Personal Goal #2 Patient to identify  strategies for weight loss of 0.5-2.0# per week of weight loss.    Comments Christianna has medical history of pulmonary HTN, COPD3, chronic respiratory failure. Per [facility] transplant RD documentation on 10/05/22, it is recommended that she lose weight to BMI 30/170# and ultimately BMI 27/150#. She does report history of over snacking on refined carbohydates, snacking in the middle of the night, etc. She has previously completed pulmonary rehab in February 2024; her starting weight at that time was 69.8kg/153.6#. She reports using MyFitness Pal app to aid with tracking calories with goal set at ~1200kcals per patient.  Rise would continue to benefit from weight loss and decrease intake of refined carbohydrates to  support pulmonary disease.      Intervention Plan   Intervention Prescribe, educate and counsel regarding individualized specific dietary modifications aiming towards targeted core components such as weight, hypertension, lipid management, diabetes, heart failure and other comorbidities.;Nutrition handout(s) given to patient.    Expected Outcomes Short Term Goal: Understand basic principles of dietary content, such as calories, fat, sodium, cholesterol and nutrients.;Long Term Goal: Adherence to prescribed nutrition plan.             Nutrition Assessments:  MEDIFICTS Score Key: >=70 Need to make dietary changes  40-70 Heart Healthy Diet <= 40 Therapeutic Level Cholesterol Diet  Flowsheet Row PULMONARY REHAB CHRONIC OBSTRUCTIVE PULMONARY DISEASE from 02/22/2022 in [facility]  Picture Your Plate Total Score on Discharge 50      Picture Your Plate Scores: <40 Unhealthy dietary pattern with much room for improvement. 41-50 Dietary pattern unlikely to meet recommendations for good health and room for improvement. 51-60 More healthful dietary pattern, with some room for improvement.  >60 Healthy dietary pattern, although there may be some specific behaviors that could be improved.    Nutrition Goals Re-Evaluation:  Nutrition Goals Re-Evaluation     Row Name 11/01/22 1307             Goals   Current Weight 177 lb 14.6 oz (80.7 kg)       Comment LDL 134, cholesterol 208, J8J 5.8       Expected Outcome Cylinda has medical history of pulmonary HTN, COPD3, chronic respiratory failure. Per [facility] transplant RD documentation on 10/05/22, it is recommended that she lose weight to BMI 30/170# and ultimately BMI 27/150#. She does report history of over snacking on refined carbohydates, snacking in the middle of the night, etc. She has previously completed pulmonary rehab in February 2024; her starting weight at that time was  69.8kg/153.6#. She reports using MyFitness Pal app to aid with tracking calories with goal set at ~1200kcals per patient. Allyssa would continue to benefit from weight loss and decrease intake of refined carbohydrates to support pulmonary disease.                Nutrition Goals Discharge (Final Nutrition Goals Re-Evaluation):  Nutrition Goals Re-Evaluation - 11/01/22 1307       Goals   Current Weight 177 lb 14.6 oz (80.7 kg)    Comment LDL 134, cholesterol 208, X9J 5.8    Expected Outcome Ajada has medical history of pulmonary HTN, COPD3, chronic respiratory failure. Per [facility] transplant RD documentation on 10/05/22, it is recommended that she lose weight to BMI 30/170# and ultimately BMI 27/150#. She does report history of over snacking on refined carbohydates, snacking in the middle of the night, etc. She has previously completed pulmonary rehab in February 2024;  her starting weight at that time was 69.8kg/153.6#. She reports using MyFitness Pal app to aid with tracking calories with goal set at ~1200kcals per patient. Callyn would continue to benefit from weight loss and decrease intake of refined carbohydrates to support pulmonary disease.             Psychosocial: Target Goals: Acknowledge presence or absence of significant depression and/or stress, maximize coping skills, provide positive support system. Participant is able to verbalize types and ability to use techniques and skills needed for reducing stress and depression.  Initial Review & Psychosocial Screening:  Initial Psych Review & Screening - 10/26/22 1103       Initial Review   Current issues with Current Depression;Current Anxiety/Panic;History of Depression;Current Stress Concerns    Comments Pt is stressed about her health and the ability to not work anymore.      Family Dynamics   Good Support System? Yes    Comments Husband, Alessandra Bevels, 2 daughters      Barriers   Psychosocial barriers to participate in  program Psychosocial barriers identified (see note);The patient should benefit from training in stress management and relaxation.      Screening Interventions   Interventions Encouraged to exercise;To provide support and resources with identified psychosocial needs    Expected Outcomes Short Term goal: Utilizing psychosocial counselor, staff and physician to assist with identification of specific Stressors or current issues interfering with healing process. Setting desired goal for each stressor or current issue identified.;Long Term Goal: Stressors or current issues are controlled or eliminated.;Short Term goal: Identification and review with participant of any Quality of Life or Depression concerns found by scoring the questionnaire.;Long Term goal: The participant improves quality of Life and PHQ9 Scores as seen by post scores and/or verbalization of changes             Quality of Life Scores:  Scores of 19 and below usually indicate a poorer quality of life in these areas.  A difference of  2-3 points is a clinically meaningful difference.  A difference of 2-3 points in the total score of the Quality of Life Index has been associated with significant improvement in overall quality of life, self-image, physical symptoms, and general health in studies assessing change in quality of life.  PHQ-9: Review Flowsheet  More data may exist      10/26/2022 02/22/2022 11/22/2021 03/22/2021 12/21/2020  Depression screen PHQ 2/9  Decreased Interest 2 1 1 1  0  Down, Depressed, Hopeless 2 0 1 1 1   PHQ - 2 Score 4 1 2 2 1   Altered sleeping 3 1 2 3  -  Tired, decreased energy 3 1 1 3  -  Change in appetite 3 1 2 1  -  Feeling bad or failure about yourself  3 2 3  0 -  Trouble concentrating 0 0 0 0 -  Moving slowly or fidgety/restless 0 0 1 0 -  Suicidal thoughts 0 0 0 0 -  PHQ-9 Score 16 6 11 9  -  Difficult doing work/chores Somewhat difficult Not difficult at all Somewhat difficult - -    Details            Interpretation of Total Score  Total Score Depression Severity:  1-4 = Minimal depression, 5-9 = Mild depression, 10-14 = Moderate depression, 15-19 = Moderately severe depression, 20-27 = Severe depression   Psychosocial Evaluation and Intervention:  Psychosocial Evaluation - 10/26/22 1105       Psychosocial Evaluation & Interventions  Interventions Stress management education;Encouraged to exercise with the program and follow exercise prescription    Comments Edelia is currently stressed about her health and not being able to work anymore. 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 She has finally been approved for disability so this has helped to decrease some of her stress. She is currently being worked up for a lung transplant at [facility]. She does admit to feeling depressed, but states she is getting ready to start therapy. She is also going to ask her therapist about starting psychotropic meds.    Expected Outcomes For Chelsa to participate in PR free of any psychosocial barreris or concerns    Continue Psychosocial Services  Follow up required by staff             Psychosocial Re-Evaluation:  Psychosocial Re-Evaluation     Row Name 10/28/22 1521             Psychosocial Re-Evaluation   Current issues with Current Stress Concerns;Current Anxiety/Panic;History of Depression;Current Depression       Comments No changes since orientation on 10/26/22. Derrica is scheduled to start PR on 10/29       Expected Outcomes For Tymeisha to attend the program without psychosocial issues or concerns.       Interventions Encouraged to attend Pulmonary Rehabilitation for the exercise;Stress management education;Relaxation education       Continue Psychosocial Services  Follow up required by staff                Psychosocial Discharge (Final Psychosocial Re-Evaluation):  Psychosocial Re-Evaluation - 10/28/22 1521       Psychosocial  Re-Evaluation   Current issues with Current Stress Concerns;Current Anxiety/Panic;History of Depression;Current Depression    Comments No changes since orientation on 10/26/22. Kadeidre is scheduled to start PR on 10/29    Expected Outcomes For Yer to attend the program without psychosocial issues or concerns.    Interventions Encouraged to attend Pulmonary Rehabilitation for the exercise;Stress management education;Relaxation education    Continue Psychosocial Services  Follow up required by staff             Education: Education Goals: Education classes will be provided on a weekly basis, covering required topics. Participant will state understanding/return demonstration of topics presented.  Learning Barriers/Preferences:  Learning Barriers/Preferences - 10/26/22 1105       Learning Barriers/Preferences   Learning Barriers None    Learning Preferences Audio;Group Instruction;Individual Instruction;Verbal Instruction;Video;Written Material             Education Topics: Know Your Numbers Group instruction that is supported by a PowerPoint presentation. Instructor discusses importance of knowing and understanding resting, exercise, and post-exercise oxygen saturation, heart rate, and blood pressure. Oxygen saturation, heart rate, blood pressure, rating of perceived exertion, and dyspnea are reviewed along with a normal range for these values.    Exercise for the Pulmonary Patient Group instruction that is supported by a PowerPoint presentation. Instructor discusses benefits of exercise, core components of exercise, frequency, duration, and intensity of an exercise routine, importance of utilizing pulse oximetry during exercise, safety while exercising, and options of places to exercise outside of rehab.  Flowsheet Row PULMONARY REHAB CHRONIC OBSTRUCTIVE PULMONARY DISEASE from 12/30/2021 in [facility] for Heart, V[facility], & Lung Health  Date 12/30/21   Educator EP  Instruction Review Code 1- Verbalizes Understanding       MET Level  Group instruction provided by PowerPoint, verbal  discussion, and written material to support subject matter. Instructor reviews what METs are and how to increase METs.    Pulmonary Medications Verbally interactive group education provided by instructor with focus on inhaled medications and proper administration.   Anatomy and Physiology of the Respiratory System Group instruction provided by PowerPoint, verbal discussion, and written material to support subject matter. Instructor reviews respiratory cycle and anatomical components of the respiratory system and their functions. Instructor also reviews differences in obstructive and restrictive respiratory diseases with examples of each.    Oxygen Safety Group instruction provided by PowerPoint, verbal discussion, and written material to support subject matter. There is an overview of "What is Oxygen" and "Why do we need it".  Instructor also reviews how to create a safe environment for oxygen use, the importance of using oxygen as prescribed, and the risks of noncompliance. There is a brief discussion on traveling with oxygen and resources the patient may utilize. Flowsheet Row PULMONARY REHAB CHRONIC OBSTRUCTIVE PULMONARY DISEASE from 01/27/2022 in [facility] for Heart, Vascular, & Lung Health  Date 01/27/22  Educator RN  Instruction Review Code 1- Verbalizes Understanding       Oxygen Use Group instruction provided by PowerPoint, verbal discussion, and written material to discuss how supplemental oxygen is prescribed and different types of oxygen supply systems. Resources for more information are provided.    Breathing Techniques Group instruction that is supported by demonstration and informational handouts. Instructor discusses the benefits of pursed lip and diaphragmatic breathing and detailed demonstration on how to  perform both.  Flowsheet Row PULMONARY REHAB CHRONIC OBSTRUCTIVE PULMONARY DISEASE from 02/10/2022 in [facility] for Heart, Vascular, & Lung Health  Date 02/10/22  Educator EP  Instruction Review Code 1- Verbalizes Understanding  [Handout also provided]        Risk Factor Reduction Group instruction that is supported by a PowerPoint presentation. Instructor discusses the definition of a risk factor, different risk factors for pulmonary disease, and how the heart and lungs work together. Flowsheet Row PULMONARY REHAB CHRONIC OBSTRUCTIVE PULMONARY DISEASE from 12/30/2021 in [facility] for Heart, Vascular, & Lung Health  Date 12/16/21  Educator EP  Instruction Review Code 1- Verbalizes Understanding       Pulmonary Diseases Group instruction provided by PowerPoint, verbal discussion, and written material to support subject matter. Instructor gives an overview of the different type of pulmonary diseases. There is also a discussion on risk factors and symptoms as well as ways to manage the diseases.   Stress and Energy Conservation Group instruction provided by PowerPoint, verbal discussion, and written material to support subject matter. Instructor gives an overview of stress and the impact it can have on the body. Instructor also reviews ways to reduce stress. There is also a discussion on energy conservation and ways to conserve energy throughout the day.   Warning Signs and Symptoms Group instruction provided by PowerPoint, verbal discussion, and written material to support subject matter. Instructor reviews warning signs and symptoms of stroke, heart attack, cold and flu. Instructor also reviews ways to prevent the spread of infection.   Other Education Group or individual verbal, written, or video instructions that support the educational goals of the pulmonary rehab program. Flowsheet Row PULMONARY REHAB CHRONIC OBSTRUCTIVE PULMONARY  DISEASE from 03/01/2022 in [facility] for Heart, Vascular, & Lung Health  Date 03/01/22  Educator EP  Instruction Review Code 1- Verbalizes Understanding  Knowledge Questionnaire Score:  Knowledge Questionnaire Score - 10/26/22 1135       Knowledge Questionnaire Score   Pre Score 17/18             Core Components/Risk Factors/Patient Goals at Admission:  Personal Goals and Risk Factors at Admission - 10/26/22 1105       Core Components/Risk Factors/Patient Goals on Admission    Weight Management Yes;Weight Loss   pt wants to lose 10lbs   Intervention Weight Management: Develop a combined nutrition and exercise program designed to reach desired caloric intake, while maintaining appropriate intake of nutrient and fiber, sodium and fats, and appropriate energy expenditure required for the weight goal.;Weight Management: Provide education and appropriate resources to help participant work on and attain dietary goals.;Weight Management/Obesity: Establish reasonable short term and long term weight goals.;Obesity: Provide education and appropriate resources to help participant work on and attain dietary goals.    Expected Outcomes Short Term: Continue to assess and modify interventions until short term weight is achieved;Long Term: Adherence to nutrition and physical activity/exercise program aimed toward attainment of established weight goal;Weight Loss: Understanding of general recommendations for a balanced deficit meal plan, which promotes 1-2 lb weight loss per week and includes a negative energy balance of [phone] kcal/d;Understanding recommendations for meals to include 15-35% energy as protein, 25-35% energy from fat, 35-60% energy from carbohydrates, less than 200mg  of dietary cholesterol, 20-35 gm of total fiber daily;Understanding of distribution of calorie intake throughout the day with the consumption of 4-5 meals/snacks    Improve shortness of breath  with ADL's Yes    Intervention Provide education, individualized exercise plan and daily activity instruction to help decrease symptoms of SOB with activities of daily living.    Expected Outcomes Short Term: Improve cardiorespiratory fitness to achieve a reduction of symptoms when performing ADLs;Long Term: Be able to perform more ADLs without symptoms or delay the onset of symptoms    Stress Yes    Intervention Offer individual and/or small group education and counseling on adjustment to heart disease, stress management and health-related lifestyle change. Teach and support self-help strategies.;Refer participants experiencing significant psychosocial distress to appropriate mental health specialists for further evaluation and treatment. When possible, include family members and significant others in education/counseling sessions.    Expected Outcomes Short Term: Participant demonstrates changes in health-related behavior, relaxation and other stress management skills, ability to obtain effective social support, and compliance with psychotropic medications if prescribed.;Long Term: Emotional wellbeing is indicated by absence of clinically significant psychosocial distress or social isolation.             Core Components/Risk Factors/Patient Goals Review:   Goals and Risk Factor Review     Row Name 10/28/22 1523             Core Components/Risk Factors/Patient Goals Review   Personal Goals Review Weight Management/Obesity;Improve shortness of breath with ADL's;Develop more efficient breathing techniques such as purse lipped breathing and diaphragmatic breathing and practicing self-pacing with activity.;Stress       Review Unable to assess. Zella is scheduled to start PR on 11/01/22       Expected Outcomes For Ireanna to lose weight, improve her SOB with ADLs, decrease stress, and develop more efficient breathing techniques such as purse lipped breathing and diaphragmatic breathing; and  practicing self-pacing with activity                Core Components/Risk Factors/Patient Goals at Discharge (Final Review):   Goals and Risk  Factor Review - 10/28/22 1523       Core Components/Risk Factors/Patient Goals Review   Personal Goals Review Weight Management/Obesity;Improve shortness of breath with ADL's;Develop more efficient breathing techniques such as purse lipped breathing and diaphragmatic breathing and practicing self-pacing with activity.;Stress    Review Unable to assess. Fallon is scheduled to start PR on 11/01/22    Expected Outcomes For Maiah to lose weight, improve her SOB with ADLs, decrease stress, and develop more efficient breathing techniques such as purse lipped breathing and diaphragmatic breathing; and practicing self-pacing with activity             ITP Comments: Pt is making expected progress toward Pulmonary Rehab goals after completing 1 session. Recommend continued exercise, life style modification, education, and utilization of breathing techniques to increase stamina and strength, while also decreasing shortness of breath with exertion.  Dr. Mechele Collin is Medical Director for Pulmonary Rehab at [facility].

## 2022-11-03 ENCOUNTER — Encounter (HOSPITAL_COMMUNITY)
Admission: RE | Admit: 2022-11-03 | Discharge: 2022-11-03 | Disposition: A | Discharge status: Home / Self Care | Payer: 59 | Source: Ambulatory Visit | Attending: Internal Medicine | Admitting: Internal Medicine

## 2022-11-03 DIAGNOSIS — J449 Chronic obstructive pulmonary disease, unspecified: Secondary | ICD-10-CM

## 2022-11-03 NOTE — Progress Notes (Signed)
Daily Session Note  Patient Details  Name: Jill Glenn MRN: [id number] Date of Birth: 07-Mar-1965 Referring Provider:   Doristine Devoid Pulmonary Rehab Walk Test from 10/26/2022 in [facility] for Heart, Vascular, & Lung Health  Referring Provider [name]       Encounter Date: 11/03/2022  Check In:  Session Check In - 11/03/22 1025       Check-In   Supervising physician immediately available to respond to emergencies [facility] MD immediately available    Physician(s) Carlyon Shadow, NP    Location [facility]    Staff Present Raford Pitcher, MS, ACSM-CEP, Exercise Physiologist;Tou Hayner Gerre Scull, RN, BSN;Casey Hermine Messick Belarus, RD, LDN;Harriett Sine, RN, MHA;David Makemson, MS, ACSM-CEP, CCRP, Exercise Physiologist    Virtual Visit No    Medication changes reported     No    Fall or balance concerns reported    No    Tobacco Cessation No Change    Warm-up and Cool-down Performed as group-led instruction    Resistance Training Performed Yes    VAD Patient? No    PAD/SET Patient? No      Pain Assessment   Currently in Pain? No/denies    Multiple Pain Sites No             Capillary Blood Glucose: No results found for this or any previous visit (from the past 24 hour(s)).    Social History   Tobacco Use  Smoking Status Former   Current packs/day: 0.00   Average packs/day: 1 pack/day for 40.6 years (40.6 ttl pk-yrs)   Types: Cigarettes   Start date: 03/05/1981   Quit date: 10/14/2021   Years since quitting: 1.0  Smokeless Tobacco Never    Goals Met:  Independence with exercise equipment Exercise tolerated well No report of concerns or symptoms today Strength training completed today  Goals Unmet:  Not Applicable  Comments: Service time is from 1009 to 1128    Dr. Mechele Collin is Medical Director for Pulmonary Rehab at [facility].

## 2022-11-08 ENCOUNTER — Emergency Department (HOSPITAL_COMMUNITY)
Admission: EM | Admit: 2022-11-08 | Discharge: 2022-11-08 | Disposition: A | Discharge status: Home / Self Care | Payer: 59 | Attending: Emergency Medicine | Admitting: Emergency Medicine

## 2022-11-08 ENCOUNTER — Emergency Department (HOSPITAL_COMMUNITY): Payer: 59

## 2022-11-08 ENCOUNTER — Other Ambulatory Visit: Payer: Self-pay

## 2022-11-08 ENCOUNTER — Encounter (HOSPITAL_COMMUNITY): Payer: Self-pay | Admitting: Emergency Medicine

## 2022-11-08 ENCOUNTER — Encounter (HOSPITAL_COMMUNITY)
Admission: RE | Admit: 2022-11-08 | Discharge: 2022-11-08 | Disposition: A | Discharge status: Home / Self Care | Payer: 59 | Source: Ambulatory Visit | Attending: Internal Medicine | Admitting: Internal Medicine

## 2022-11-08 DIAGNOSIS — R079 Chest pain, unspecified: Secondary | ICD-10-CM | POA: Insufficient documentation

## 2022-11-08 DIAGNOSIS — J449 Chronic obstructive pulmonary disease, unspecified: Secondary | ICD-10-CM | POA: Insufficient documentation

## 2022-11-08 DIAGNOSIS — R0789 Other chest pain: Secondary | ICD-10-CM | POA: Diagnosis not present

## 2022-11-08 LAB — BASIC METABOLIC PANEL
Anion gap: 14 (ref 5–15)
BUN: 19 mg/dL (ref 6–20)
CO2: 21 mmol/L — ABNORMAL LOW (ref 22–32)
Calcium: 9.9 mg/dL (ref 8.9–10.3)
Chloride: 105 mmol/L (ref 98–111)
Creatinine, Ser: 0.65 mg/dL (ref 0.44–1.00)
GFR, Estimated: >60 mL/min (ref >60)
Glucose, Bld: 100 mg/dL — ABNORMAL HIGH (ref 70–99)
Potassium: 3.6 mmol/L (ref 3.5–5.1)
Sodium: 140 mmol/L (ref 135–145)

## 2022-11-08 LAB — CBC
HCT: 41.9 % (ref 36.0–46.0)
Hemoglobin: 13.6 g/dL (ref 12.0–15.0)
MCH: 28.3 pg (ref 26.0–34.0)
MCHC: 32.5 g/dL (ref 30.0–36.0)
MCV: 87.1 fL (ref 80.0–100.0)
Platelets: 391 10*3/uL (ref 150–400)
RBC: 4.81 MIL/uL (ref 3.87–5.11)
RDW: 13.3 % (ref 11.5–15.5)
WBC: 11.4 10*3/uL — ABNORMAL HIGH (ref 4.0–10.5)
nRBC: 0 % (ref 0.0–0.2)

## 2022-11-08 LAB — TROPONIN I (HIGH SENSITIVITY)
Troponin I (High Sensitivity): 3 ng/L (ref <18)
Troponin I (High Sensitivity): 2 ng/L (ref <18)

## 2022-11-08 NOTE — ED Provider Triage Note (Signed)
Emergency Medicine Provider Triage Evaluation Note  Jill Glenn , a 57 y.o. female  was evaluated in triage.  Pt complains of chest pain.  She has a history of COPD, is on the transplant list for lungs.  Over the past few days that she has had episodes of crescendo like chest pain that are brief, sharp, resolved without intervention, occur without precipitant.  She was at the pulmonology clinic and sent here for evaluation..  Review of Systems  Positive: HPI Negative: Current chest pain  Physical Exam  BP (!) 123/96 (BP Location: Right Arm)   Pulse 78   Temp 98 F (36.7 C) (Oral)   Resp (!) 21   SpO2 99%  Gen:   Awake, no distress speaking clearly Resp:  Normal effort no increased work of breathing, 2 L, per baseline MSK:   Moves extremities without difficulty no obvious deformity Other:  Neuro: Awake, alert, no gross deficits  Medical Decision Making  Medically screening exam initiated at 1:35 PM.  Appropriate orders placed.  Zane Samson Julin was informed that the remainder of the evaluation will be completed by another provider, this initial triage assessment does not replace that evaluation, and the importance of remaining in the ED until their evaluation is complete.   Gerhard Munch, MD 11/08/22 [phone]

## 2022-11-08 NOTE — ED Triage Notes (Signed)
Pt BIB [facility] from pulmonology office with reports of chest pain. Pt reports the pain has been ongoing for around 2 months "on and off" but reports today the pain was worse.

## 2022-11-08 NOTE — Progress Notes (Signed)
Pt came to PR this AM. Pre-exercise VS 98/58 manual, 78, 100% on 2L [place]. After warm up pt stated she was having on/off sternal stabbing pain. Pt stated this has happened before for the last 1-2 months. Stated her MD knows that she has been having this problem but stated they "kind of just brushed it off". Pt stated she felt ok to exercise today. Encouraged pt to take it easy today and to listen to her body.   Pt exercised on [place] for 15 minutes and walked track for . Pt didn't report any s/s to staff while exercising. At check out pt in distress. VS 104/60 manual, 94, 96% on 2L [place]. Pt stating pain is getting worse, 7/10, stabbing pain in center of chest to left side. Pt taken to treatment room and 12 lead obtained. Provider on call notified and in to assess pt and reviewed EKG. Carlos Levering, NP advised pt to go to ED for further work up. Pt agrees to plan. EMS called. VS retake BP 116/62, 79, 98% on 2L [place].   Pt left cardiac rehab via stretcher with purse, POC, and water bottle, and sweatshirt.

## 2022-11-08 NOTE — ED Provider Notes (Signed)
[facility] AT [facility] Provider Note   CSN: [id number] Arrival date & time: 11/08/22  1232     History  Chief Complaint  Patient presents with   Chest Pain    Jill Glenn is a 57 y.o. female.   Chest Pain Patient presents with weeks to months of chest pain.  Sharp in the lower anterior chest wall.  Comes and goes.  Comes on quickly and goes away quickly.  Not exertional and comes back whenever it wants to.  Had worse episode today while at pulmonary and sent in.  Has a history of severe emphysema.  On oxygen.  No swelling in her legs.  No cough.  Does not feel more short of breath.     Home Medications Prior to Admission medications   Medication Sig Start Date End Date Taking? Authorizing Provider  acetaminophen (TYLENOL) 325 MG tablet Take 2 tablets (650 mg total) by mouth every 6 (six) hours as needed for moderate pain, mild pain or headache. 11/03/20   Maury Dus, MD  albuterol (PROVENTIL) (2.5 MG/3ML) 0.083% nebulizer solution Take 3 mLs (2.5 mg total) by nebulization every 6 (six) hours as needed for wheezing or shortness of breath. 06/02/22   Charlott Holler, MD  albuterol (VENTOLIN HFA) 108 (90 Base) MCG/ACT inhaler Inhale 2 puffs into the lungs every 6 (six) hours as needed for wheezing or shortness of breath. 09/07/22   Charlott Holler, MD  amoxicillin (AMOXIL) 500 MG capsule Take 1 capsule (500 mg total) by mouth 2 (two) times daily for 10 days. 10/29/22 11/08/22  Reed Pandy, PA-C  Ascorbic Acid (VITAMIN C GUMMIES PO) Take 500 mg by mouth daily.    [provider]  budesonide (PULMICORT) 0.5 MG/2ML nebulizer solution Take 2 mLs (0.5 mg total) by nebulization in the morning and at bedtime. 09/13/22   Charlott Holler, MD  Cranberry 500 MG TABS Take 500 mg by mouth daily as needed (for UTI prevention).    [provider]  Glycopyrrolate-Formoterol (BEVESPI AEROSPHERE) 9-4.8 MCG/ACT AERO Inhale 2 puffs into the lungs  2 (two) times daily. 09/13/22   Charlott Holler, MD  levocetirizine (XYZAL) 5 MG tablet Take 1 tablet (5 mg total) by mouth every evening. 06/02/22   Charlott Holler, MD  montelukast (SINGULAIR) 10 MG tablet Take 1 tablet (10 mg total) by mouth at bedtime. 06/02/22   Charlott Holler, MD  Multiple Vitamin (MULTIVITAMIN ADULT) TABS Take 1 tablet by mouth daily with breakfast.    [provider]  rizatriptan (MAXALT) 10 MG tablet Take 1 tablet (10 mg total) by mouth once as needed for migraine. May repeat in 2 hours if needed 03/26/22 03/27/23  Rai, Delene Ruffini, MD  Ubrogepant (UBRELVY) 100 MG TABS Take one tablet onset of Migraine May take another tablet 2 hours later, Don't exceed two tablets daily 07/05/22   Anson Fret, MD  varenicline (CHANTIX) 1 MG tablet Take 1 mg by mouth daily.    [provider]      Allergies    Codeine    Review of Systems   Review of Systems  Cardiovascular:  Positive for chest pain.    Physical Exam Updated Vital Signs BP 114/72 (BP Location: Right Arm)   Pulse 86   Temp 97.6 F (36.4 C)   Resp 20   SpO2 100%  Physical Exam Vitals reviewed.  Cardiovascular:     Rate and Rhythm: Normal rate and regular  rhythm.  Pulmonary:     Breath sounds: No wheezing or rhonchi.     Comments: Nasal cannula oxygen in place. Chest:     Chest wall: No tenderness.  Musculoskeletal:     Right lower leg: No tenderness.     Left lower leg: No tenderness.  Skin:    General: Skin is warm.  Neurological:     Mental Status: She is alert.     ED Results / Procedures / Treatments   Labs (all labs ordered are listed, but only abnormal results are displayed) Labs Reviewed  BASIC METABOLIC PANEL - Abnormal; Notable for the following components:      Result Value   CO2 21 (*)    Glucose, Bld 100 (*)    All other components within normal limits  CBC - Abnormal; Notable for the following components:   WBC 11.4 (*)    All other components within normal  limits  TROPONIN I (HIGH SENSITIVITY)  TROPONIN I (HIGH SENSITIVITY)    EKG EKG Interpretation Date/Time:  Tuesday November 08 2022 13:04:52 EST Ventricular Rate:  74 PR Interval:  174 QRS Duration:  88 QT Interval:  402 QTC Calculation: 446 R Axis:   68  Text Interpretation: Normal sinus rhythm Normal ECG When compared with ECG of 08-Nov-2022 11:39, No significant change since last tracing Confirmed by Benjiman Core [id number]) on 11/08/2022 6:24:58 PM  Radiology DG Chest 2 View  Result Date: 11/08/2022 CLINICAL DATA:  Intermittent midline to left-sided chest pain for the past 2 months. Shortness of breath. EXAM: CHEST - 2 VIEW COMPARISON:  CT chest dated September 29, 2022. Chest x-ray dated March 24, 2022. FINDINGS: The heart size and mediastinal contours are within normal limits. Normal pulmonary vascularity. The lungs remain hyperinflated with emphysematous changes and coarse interstitial markings at the lung bases. No focal consolidation, pleural effusion, or pneumothorax. No acute osseous abnormality. IMPRESSION: 1. No acute cardiopulmonary disease. 2. COPD. Electronically Signed   By: Obie Dredge M.D.   On: 11/08/2022 16:08    Procedures Procedures    Medications Ordered in ED Medications - No data to display  ED Course/ Medical Decision Making/ A&P Clinical Course as of 11/08/22 1833  Tue Nov 08, 2022  1432 Troponin I (High Sensitivity) [ME]    Clinical Course User Index [ME] Haywood Filler D                                 Medical Decision Making Amount and/or Complexity of Data Reviewed Labs: ordered. Radiology: ordered.   Patient with weeks of chest pain.  Anterior chest.  Comes and goes.  Not exertional.  Differential diagnosis along with does include cause such as cardiac disease, arrhythmia, pneumothorax.  X-ray reassuring.  Troponin negative.  EKG stable.  Doubt cardiac ischemia as a cause.  Reassuring particularly since not exertional.  Feels at  her baseline breathing.  Appears stable for discharge home with outpatient follow-up.  I reviewed pulmonary notes from today and from most recent pulmonary visit.        Final Clinical Impression(s) / ED Diagnoses Final diagnoses:  Nonspecific chest pain    Rx / DC Orders ED Discharge Orders     None         Benjiman Core, MD 11/08/22 [phone]

## 2022-11-08 NOTE — ED Notes (Signed)
Called for vitals, no answer 

## 2022-11-08 NOTE — Progress Notes (Signed)
Daily Session Note  Patient Details  Name: Jill Glenn MRN: [id number] Date of Birth: 09-30-1965 Referring Provider:   Doristine Devoid Pulmonary Rehab Walk Test from 10/26/2022 in [facility] for Heart, Vascular, & Lung Health  Referring Provider [name]       Encounter Date: 11/08/2022  Check In:  Session Check In - 11/08/22 1027       Check-In   Supervising physician immediately available to respond to emergencies [facility] MD immediately available    Physician(s) Carlyon Shadow, NP    Location [facility]    Staff Present Raford Pitcher, MS, ACSM-CEP, Exercise Physiologist;Gwen Sarvis Gerre Scull, RN, BSN;Casey Melany Guernsey, RN, MHA    Virtual Visit No    Medication changes reported     No    Fall or balance concerns reported    No    Tobacco Cessation No Change    Warm-up and Cool-down Performed as group-led instruction    Resistance Training Performed Yes    VAD Patient? No    PAD/SET Patient? No      Pain Assessment   Currently in Pain? No/denies    Multiple Pain Sites No             Capillary Blood Glucose: No results found for this or any previous visit (from the past 24 hour(s)).    Social History   Tobacco Use  Smoking Status Former   Current packs/day: 0.00   Average packs/day: 1 pack/day for 40.6 years (40.6 ttl pk-yrs)   Types: Cigarettes   Start date: 03/05/1981   Quit date: 10/14/2021   Years since quitting: 1.0  Smokeless Tobacco Never    Goals Met:  Independence with exercise equipment   Goals Unmet:  See below  Comments:   Pt came to [place] this AM. Pre-exercise VS 98/58 manual, 78, 100% on 2L [place]. After warm up pt stated she was having on/off sternal stabbing pain. Pt stated this has happened before for the last 1-2 months. Stated her MD knows that she has been having this problem but stated they "kind of just brushed it off". Pt stated she felt ok to exercise today. Encouraged pt to take it  easy today and to listen to her body.    Pt exercised on [place] for 15 minutes and walked track for . Pt didn't report any s/s to staff while exercising. At check out pt in distress. VS 104/60 manual, 94, 96% on 2L [place]. Pt stating pain is getting worse, 7/10, stabbing pain in center of chest to left side. Pt taken to treatment room and 12 lead obtained. Provider on call notified and in to assess pt and reviewed EKG. Carlos Levering, NP advised pt to go to ED for further work up. Pt agrees to plan. EMS called. VS retake BP 116/62, 79, 98% on 2L .    Pt left cardiac rehab via stretcher with purse, POC, and water bottle, and sweatshirt.     Dr. Mechele Collin is Medical Director for Pulmonary Rehab at [facility].

## 2022-11-10 ENCOUNTER — Encounter (HOSPITAL_COMMUNITY)
Admission: RE | Admit: 2022-11-10 | Discharge: 2022-11-10 | Disposition: A | Discharge status: Home / Self Care | Payer: 59 | Source: Ambulatory Visit | Attending: Internal Medicine | Admitting: Internal Medicine

## 2022-11-10 DIAGNOSIS — J449 Chronic obstructive pulmonary disease, unspecified: Secondary | ICD-10-CM

## 2022-11-10 DIAGNOSIS — R079 Chest pain, unspecified: Secondary | ICD-10-CM | POA: Diagnosis not present

## 2022-11-10 NOTE — Progress Notes (Signed)
Daily Session Note  Patient Details  Name: Jill Glenn MRN: [id number] Date of Birth: [birth year] Referring Provider:   Doristine Devoid Pulmonary Rehab Walk Test from 10/26/2022 in [facility] for Heart, Vascular, & Lung Health  Referring Provider [name]       Encounter Date: 11/10/2022  Check In:  Session Check In - 11/10/22 1027       Check-In   Supervising physician immediately available to respond to emergencies [facility] MD immediately available    Physician(s) Robin Searing, NP    Location [facility]    Staff Present Raford Pitcher, MS, ACSM-CEP, Exercise Physiologist;Mary Gerre Scull, RN, BSN;Casey Katrinka Blazing, RT;Randi Reeve BS, ACSM-CEP, Exercise Physiologist;Samantha Belarus, RD, LDN    Virtual Visit No    Medication changes reported     No    Fall or balance concerns reported    No    Tobacco Cessation No Change    Warm-up and Cool-down Performed as group-led instruction    Resistance Training Performed Yes    VAD Patient? No    PAD/SET Patient? No      Pain Assessment   Currently in Pain? No/denies    Multiple Pain Sites No             Capillary Blood Glucose: No results found for this or any previous visit (from the past 24 hour(s)).    Social History   Tobacco Use  Smoking Status Former   Current packs/day: 0.00   Average packs/day: 1 pack/day for 40.6 years (40.6 ttl pk-yrs)   Types: Cigarettes   Start date: 03/05/1981   Quit date: 10/14/2021   Years since quitting: 1.0  Smokeless Tobacco Never    Goals Met:  Proper associated with RPD/PD & O2 Sat Independence with exercise equipment Exercise tolerated well No report of concerns or symptoms today Strength training completed today  Goals Unmet:  Not Applicable  Comments: Service time is from 1006 to 1135.    Dr. Mechele Collin is Medical Director for Pulmonary Rehab at [facility].

## 2022-11-15 ENCOUNTER — Encounter (HOSPITAL_COMMUNITY)
Admission: RE | Admit: 2022-11-15 | Discharge: 2022-11-15 | Disposition: A | Discharge status: Home / Self Care | Payer: 59 | Source: Ambulatory Visit | Attending: Internal Medicine | Admitting: Internal Medicine

## 2022-11-15 VITALS — Wt 178.1 lb

## 2022-11-15 DIAGNOSIS — J449 Chronic obstructive pulmonary disease, unspecified: Secondary | ICD-10-CM | POA: Diagnosis not present

## 2022-11-15 DIAGNOSIS — R079 Chest pain, unspecified: Secondary | ICD-10-CM | POA: Diagnosis not present

## 2022-11-15 NOTE — Progress Notes (Signed)
Daily Session Note  Patient Details  Name: Jill Glenn MRN: [id number] Date of Birth: 07/22/1965 Referring Provider:   Doristine Devoid Pulmonary Rehab Walk Test from 10/26/2022 in [facility] for Heart, Vascular, & Lung Health  Referring Provider [name]       Encounter Date: 11/15/2022  Check In:  Session Check In - 11/15/22 1026       Check-In   Supervising physician immediately available to respond to emergencies [facility] MD immediately available    Physician(s) Joni Reining, NP    Location [facility]    Staff Present Raford Pitcher, MS, ACSM-CEP, Exercise Physiologist;Mary Gerre Scull, RN, BSN;Casey Katrinka Blazing, RT;Randi Reeve BS, ACSM-CEP, Exercise Physiologist;Samantha Belarus, RD, LDN    Virtual Visit No    Medication changes reported     No    Fall or balance concerns reported    No    Tobacco Cessation No Change    Warm-up and Cool-down Performed as group-led instruction    Resistance Training Performed Yes    VAD Patient? No    PAD/SET Patient? No      Pain Assessment   Currently in Pain? No/denies    Multiple Pain Sites No             Capillary Blood Glucose: No results found for this or any previous visit (from the past 24 hour(s)).   Exercise Prescription Changes - 11/15/22 1100       Response to Exercise   Blood Pressure (Admit) 104/70    Blood Pressure (Exercise) 112/70    Blood Pressure (Exit) 104/68    Heart Rate (Admit) 80 bpm    Heart Rate (Exercise) 98 bpm    Heart Rate (Exit) 91 bpm    Oxygen Saturation (Admit) 97 %    Oxygen Saturation (Exercise) 93 %    Oxygen Saturation (Exit) 97 %    Rating of Perceived Exertion (Exercise) 13    Perceived Dyspnea (Exercise) 2    Duration Continue with 30 min of aerobic exercise without signs/symptoms of physical distress.    Intensity THRR unchanged      Progression   Progression Continue to progress workloads to maintain intensity without signs/symptoms of physical  distress.      Resistance Training   Training Prescription Yes    Weight blue bands    Reps 10-15    Time 10 Minutes      Oxygen   Oxygen Continuous    Liters 2      Treadmill   MPH 1.1    Grade 0    Minutes 15    METs 2.1      Recumbant Elliptical   Level 1    Minutes 15    METs 2.6      Oxygen   Maintain Oxygen Saturation 88% or higher             Social History   Tobacco Use  Smoking Status Former   Current packs/day: 0.00   Average packs/day: 1 pack/day for 40.6 years (40.6 ttl pk-yrs)   Types: Cigarettes   Start date: 03/05/1981   Quit date: 10/14/2021   Years since quitting: 1.0  Smokeless Tobacco Never    Goals Met:  Proper associated with RPD/PD & O2 Sat Independence with exercise equipment Exercise tolerated well No report of concerns or symptoms today Strength training completed today  Goals Unmet:  Not Applicable  Comments: Service time is from 1025 to 1130.    Dr.  Mechele Collin is Medical Director for Pulmonary Rehab at [facility].

## 2022-11-16 ENCOUNTER — Ambulatory Visit (INDEPENDENT_AMBULATORY_CARE_PROVIDER_SITE_OTHER): Payer: 59 | Admitting: Internal Medicine

## 2022-11-16 VITALS — BP 124/80 | HR 66 | Temp 97.6°F | Resp 16 | Ht 63.0 in | Wt 177.0 lb

## 2022-11-16 DIAGNOSIS — Z2911 Encounter for prophylactic immunotherapy for respiratory syncytial virus (RSV): Secondary | ICD-10-CM

## 2022-11-16 DIAGNOSIS — K2100 Gastro-esophageal reflux disease with esophagitis, without bleeding: Secondary | ICD-10-CM | POA: Diagnosis not present

## 2022-11-16 DIAGNOSIS — R7303 Prediabetes: Secondary | ICD-10-CM

## 2022-11-16 DIAGNOSIS — Z23 Encounter for immunization: Secondary | ICD-10-CM

## 2022-11-16 DIAGNOSIS — E2839 Other primary ovarian failure: Secondary | ICD-10-CM | POA: Insufficient documentation

## 2022-11-16 DIAGNOSIS — Z1231 Encounter for screening mammogram for malignant neoplasm of breast: Secondary | ICD-10-CM

## 2022-11-16 LAB — HEMOGLOBIN A1C: Hgb A1c MFr Bld: 5.9 % (ref 4.6–6.5)

## 2022-11-16 MED ORDER — ESOMEPRAZOLE MAGNESIUM 40 MG PO CPDR: 40.0000 mg | DELAYED_RELEASE_CAPSULE | Freq: Every day | ORAL | 0 refills | Status: DC

## 2022-11-16 MED ORDER — AREXVY 120 MCG/0.5ML IM SUSR: 0.5000 mL | Freq: Once | INTRAMUSCULAR | 0 refills | Status: AC

## 2022-11-16 NOTE — Progress Notes (Signed)
Subjective:  Patient ID: Jill Glenn, female    DOB: 1965-02-14  Age: 57 y.o. MRN: [id number]  CC: Gastroesophageal Reflux   HPI Jill Glenn presents for f/up ----  Discussed the use of AI scribe software for clinical note transcription with the patient, who gave verbal consent to proceed.  History of Present Illness   The patient, with a history of pulmonary disease, is considering a lung transplant at [facility]. She recently underwent limited assessments and is due for a follow-up in April. In the interim, she is scheduled for a lung volume reduction consultation in December.  The patient reports experiencing chest pain, which has been severe enough to warrant a hospital visit. The pain is described as starting off dull, becoming sharp, and then subsiding. It is not associated with any specific activity and has been ruled out as cardiac-related. She has a history of heartburn and indigestion, which she manages with over-the-counter omeprazole. She also reports occasional difficulty swallowing, a symptom she has been experiencing for a long time.  The patient is currently on a 1200 calorie diet and has lost five pounds. She denies any symptoms of excessive thirst or urination, which could indicate blood sugar issues. She also denies any swelling in her legs or feet.       She has to get vaccinated against Hep B before the lung surgery.  Outpatient Medications Prior to Visit  Medication Sig Dispense Refill   acetaminophen (TYLENOL) 325 MG tablet Take 2 tablets (650 mg total) by mouth every 6 (six) hours as needed for moderate pain, mild pain or headache.     albuterol (PROVENTIL) (2.5 MG/3ML) 0.083% nebulizer solution Take 3 mLs (2.5 mg total) by nebulization every 6 (six) hours as needed for wheezing or shortness of breath. 75 mL 12   albuterol (VENTOLIN HFA) 108 (90 Base) MCG/ACT inhaler Inhale 2 puffs into the lungs every 6 (six) hours as needed for wheezing or shortness of  breath. 18 g 3   Ascorbic Acid (VITAMIN C GUMMIES PO) Take 500 mg by mouth daily.     levocetirizine (XYZAL) 5 MG tablet Take 1 tablet (5 mg total) by mouth every evening. 30 tablet 11   montelukast (SINGULAIR) 10 MG tablet Take 1 tablet (10 mg total) by mouth at bedtime. 30 tablet 11   Multiple Vitamin (MULTIVITAMIN ADULT) TABS Take 1 tablet by mouth daily with breakfast.     rizatriptan (MAXALT) 10 MG tablet Take 1 tablet (10 mg total) by mouth once as needed for migraine. May repeat in 2 hours if needed 30 tablet 2   TRELEGY ELLIPTA 200-62.5-25 MCG/ACT AEPB daily.     Ubrogepant (UBRELVY) 100 MG TABS Take one tablet onset of Migraine May take another tablet 2 hours later, Don't exceed two tablets daily 16 tablet 11   varenicline (CHANTIX) 1 MG tablet Take 1 mg by mouth daily.     budesonide (PULMICORT) 0.5 MG/2ML nebulizer solution Take 2 mLs (0.5 mg total) by nebulization in the morning and at bedtime. (Patient not taking: Reported on 11/16/2022) 60 mL 11   Cranberry 500 MG TABS Take 500 mg by mouth daily as needed (for UTI prevention). (Patient not taking: Reported on 11/16/2022)     Glycopyrrolate-Formoterol (BEVESPI AEROSPHERE) 9-4.8 MCG/ACT AERO Inhale 2 puffs into the lungs 2 (two) times daily. (Patient not taking: Reported on 11/16/2022) 1 each 11   No facility-administered medications prior to visit.    ROS Review of Systems  HENT:  Positive  for trouble swallowing. Negative for sore throat.   Respiratory:  Positive for shortness of breath. Negative for cough, chest tightness and wheezing.   Cardiovascular:  Positive for chest pain. Negative for palpitations and leg swelling.  Gastrointestinal:  Negative for abdominal pain, constipation, diarrhea, nausea and vomiting.  Genitourinary:  Negative for difficulty urinating and dyspareunia.  Musculoskeletal: Negative.  Negative for arthralgias, myalgias and neck pain.  Skin: Negative.   Hematological:  Negative for adenopathy. Does not  bruise/bleed easily.  Psychiatric/Behavioral:  Negative for confusion and decreased concentration.     Objective:  BP 124/80 (BP Location: Left Arm, Patient Position: Sitting, Cuff Size: Normal)   Pulse 66   Temp 97.6 F (36.4 C) (Oral)   Resp 16   Ht 5\' 3"  (1.6 m)   Wt 177 lb (80.3 kg)   SpO2 95%   BMI 31.35 kg/m   BP Readings from Last 3 Encounters:  11/16/22 124/80  11/08/22 111/81  10/26/22 106/70    Wt Readings from Last 3 Encounters:  11/16/22 177 lb (80.3 kg)  11/15/22 178 lb 2.1 oz (80.8 kg)  11/01/22 178 lb 2.1 oz (80.8 kg)    Physical Exam Vitals reviewed.  Constitutional:      Appearance: She is ill-appearing (continuous oxygen).  HENT:     Mouth/Throat:     Mouth: Mucous membranes are moist.  Eyes:     General: No scleral icterus.    Conjunctiva/sclera: Conjunctivae normal.  Cardiovascular:     Rate and Rhythm: Normal rate and regular rhythm.     Heart sounds: Heart sounds are distant. No murmur heard.    No friction rub. No gallop.  Pulmonary:     Effort: Tachypnea and accessory muscle usage present. No respiratory distress.     Breath sounds: Examination of the left-upper field reveals decreased breath sounds. Examination of the right-middle field reveals decreased breath sounds. Examination of the left-middle field reveals decreased breath sounds. Examination of the right-lower field reveals decreased breath sounds. Examination of the left-lower field reveals decreased breath sounds. Decreased breath sounds present. No wheezing, rhonchi or rales.  Abdominal:     General: Abdomen is flat.     Palpations: There is no mass.     Tenderness: There is no abdominal tenderness. There is no guarding.     Hernia: No hernia is present.  Musculoskeletal:        General: Normal range of motion.     Cervical back: Neck supple.     Right lower leg: No edema.     Left lower leg: No edema.  Lymphadenopathy:     Cervical: No cervical adenopathy.  Skin:     General: Skin is warm and dry.  Neurological:     General: No focal deficit present.     Mental Status: Mental status is at baseline.  Psychiatric:        Mood and Affect: Mood normal.     Lab Results  Component Value Date   WBC 11.4 (H) 11/08/2022   HGB 13.6 11/08/2022   HCT 41.9 11/08/2022   PLT 391 11/08/2022   GLUCOSE 100 (H) 11/08/2022   CHOL 259 (H) 04/12/2022   TRIG 139.0 04/12/2022   HDL 71.10 04/12/2022   LDLCALC 160 (H) 04/12/2022   ALT 21 03/23/2022   AST 24 03/23/2022   NA 140 11/08/2022   K 3.6 11/08/2022   CL 105 11/08/2022   CREATININE 0.65 11/08/2022   BUN 19 11/08/2022   CO2 21 (L)  11/08/2022   TSH 1.12 04/12/2022   INR 1.0 03/23/2022   HGBA1C 5.9 11/16/2022    No results found.  Assessment & Plan:  Prediabetes -     Hemoglobin A1c; Future  Need for immunization against influenza -     Flu vaccine trivalent PF, 6mos and older(Flulaval,Afluria,Fluarix,Fluzone)  Estrogen deficiency -     DG Bone Density; Future  Gastroesophageal reflux disease with esophagitis without hemorrhage- Will upgrade to a more potent PPI. -     Esomeprazole Magnesium; Take 1 capsule (40 mg total) by mouth daily.  Dispense: 90 capsule; Refill: 0  Need for RSV vaccination -     Arexvy; Inject 0.5 mLs into the muscle once for 1 dose.  Dispense: 0.5 mL; Refill: 0  Visit for screening mammogram -     3D Screening Mammogram, Left and Right; Future  Need for DTP, Hib conjugate and hepatitis B vaccine -     Heplisav-B (HepB-CPG) Vaccine     Follow-up: Return in about 6 months (around 05/16/2023).  Sanda Linger, MD

## 2022-11-16 NOTE — Patient Instructions (Signed)
Gastroesophageal Reflux Disease, Adult    Gastroesophageal reflux (GER) happens when acid from the stomach flows up into the tube that connects the mouth and the stomach (esophagus). Normally, food travels down the esophagus and stays in the stomach to be digested. However, when a person has GER, food and stomach acid sometimes move back up into the esophagus. If this becomes a more serious problem, the person may be diagnosed with a disease called gastroesophageal reflux disease (GERD). GERD occurs when the reflux:  Happens often.  Causes frequent or severe symptoms.  Causes problems such as damage to the esophagus.  When stomach acid comes in contact with the esophagus, the acid may cause inflammation in the esophagus. Over time, GERD may create small holes (ulcers) in the lining of the esophagus.  What are the causes?  This condition is caused by a problem with the muscle between the esophagus and the stomach (lower esophageal sphincter, or LES). Normally, the LES muscle closes after food passes through the esophagus to the stomach. When the LES is weakened or abnormal, it does not close properly, and that allows food and stomach acid to go back up into the esophagus.  The LES can be weakened by certain dietary substances, medicines, and medical conditions, including:  Tobacco use.  Pregnancy.  Having a hiatal hernia.  Alcohol use.  Certain foods and beverages, such as coffee, chocolate, onions, and peppermint.  What increases the risk?  You are more likely to develop this condition if you:  Have an increased body weight.  Have a connective tissue disorder.  Take NSAIDs, such as ibuprofen.  What are the signs or symptoms?  Symptoms of this condition include:  Heartburn.  Difficult or painful swallowing and the feeling of having a lump in the throat.  A bitter taste in the mouth.  Bad breath and having a large amount of saliva.  Having an upset or bloated stomach and belching.  Chest pain. Different conditions can  cause chest pain. Make sure you see your health care provider if you experience chest pain.  Shortness of breath or wheezing.  Ongoing (chronic) cough or a nighttime cough.  Wearing away of tooth enamel.  Weight loss.  How is this diagnosed?  This condition may be diagnosed based on a medical history and a physical exam. To determine if you have mild or severe GERD, your health care provider may also monitor how you respond to treatment. You may also have tests, including:  A test to examine your stomach and esophagus with a small camera (endoscopy).  A test that measures the acidity level in your esophagus.  A test that measures how much pressure is on your esophagus.  A barium swallow or modified barium swallow test to show the shape, size, and functioning of your esophagus.  How is this treated?  Treatment for this condition may vary depending on how severe your symptoms are. Your health care provider may recommend:  Changes to your diet.  Medicine.  Surgery.  The goal of treatment is to help relieve your symptoms and to prevent complications.  Follow these instructions at home:  Eating and drinking    Follow a diet as recommended by your health care provider. This may involve avoiding foods and drinks such as:  Coffee and tea, with or without caffeine.  Drinks that contain alcohol.  Energy drinks and sports drinks.  Carbonated drinks or sodas.  Chocolate and cocoa.  Peppermint and mint flavorings.  Garlic and onions.  Horseradish.  Spicy and acidic foods, including peppers, chili powder, curry powder, vinegar, hot sauces, and barbecue sauce.  Citrus fruit juices and citrus fruits, such as oranges, lemons, and limes.  Tomato-based foods, such as red sauce, chili, salsa, and pizza with red sauce.  Fried and fatty foods, such as donuts, french fries, potato chips, and high-fat dressings.  High-fat meats, such as hot dogs and fatty cuts of red and white meats, such as rib eye steak, sausage, ham, and  bacon.  High-fat dairy items, such as whole milk, butter, and cream cheese.  Eat small, frequent meals instead of large meals.  Avoid drinking large amounts of liquid with your meals.  Avoid eating meals during the 2-3 hours before bedtime.  Avoid lying down right after you eat.  Do not exercise right after you eat.  Lifestyle    Do not use any products that contain nicotine or tobacco. These products include cigarettes, chewing tobacco, and vaping devices, such as e-cigarettes. If you need help quitting, ask your health care provider.  Try to reduce your stress by using methods such as yoga or meditation. If you need help reducing stress, ask your health care provider.  If you are overweight, reduce your weight to an amount that is healthy for you. Ask your health care provider for guidance about a safe weight loss goal.  General instructions  Pay attention to any changes in your symptoms.  Take over-the-counter and prescription medicines only as told by your health care provider. Do not take aspirin, ibuprofen, or other NSAIDs unless your health care provider told you to take these medicines.  Wear loose-fitting clothing. Do not wear anything tight around your waist that causes pressure on your abdomen.  Raise (elevate) the head of your bed about 6 inches (15 cm). You can use a wedge to do this.  Avoid bending over if this makes your symptoms worse.  Keep all follow-up visits. This is important.  Contact a health care provider if:  You have:  New symptoms.  Unexplained weight loss.  Difficulty swallowing or it hurts to swallow.  Wheezing or a persistent cough.  A hoarse voice.  Your symptoms do not improve with treatment.  Get help right away if:  You have sudden pain in your arms, neck, jaw, teeth, or back.  You suddenly feel sweaty, dizzy, or light-headed.  You have chest pain or shortness of breath.  You vomit and the vomit is green, yellow, or black, or it looks like blood or coffee grounds.  You faint.  You  have stool that is red, bloody, or black.  You cannot swallow, drink, or eat.  These symptoms may represent a serious problem that is an emergency. Do not wait to see if the symptoms will go away. Get medical help right away. Call your local emergency services (911 in the [place] Do not drive yourself to the hospital.  Summary  Gastroesophageal reflux happens when acid from the stomach flows up into the esophagus. GERD is a disease in which the reflux happens often, causes frequent or severe symptoms, or causes problems such as damage to the esophagus.  Treatment for this condition may vary depending on how severe your symptoms are. Your health care provider may recommend diet and lifestyle changes, medicine, or surgery.  Contact a health care provider if you have new or worsening symptoms.  Take over-the-counter and prescription medicines only as told by your health care provider. Do not take aspirin, ibuprofen, or other NSAIDs  unless your health care provider told you to do so.  Keep all follow-up visits as told by your health care provider. This is important.  This information is not intended to replace advice given to you by your health care provider. Make sure you discuss any questions you have with your health care provider.  Document Revised: 06/29/2019 Document Reviewed: 07/01/2019  Elsevier Patient Education  2024 [employer].

## 2022-11-17 ENCOUNTER — Encounter (HOSPITAL_COMMUNITY)
Admission: RE | Admit: 2022-11-17 | Discharge: 2022-11-17 | Disposition: A | Discharge status: Home / Self Care | Payer: 59 | Source: Ambulatory Visit | Attending: Internal Medicine | Admitting: Internal Medicine

## 2022-11-17 DIAGNOSIS — J449 Chronic obstructive pulmonary disease, unspecified: Secondary | ICD-10-CM | POA: Diagnosis not present

## 2022-11-17 DIAGNOSIS — R079 Chest pain, unspecified: Secondary | ICD-10-CM | POA: Diagnosis not present

## 2022-11-17 NOTE — Progress Notes (Signed)
Daily Session Note  Patient Details  Name: Jill Glenn MRN: [id number] Date of Birth: 06-06-1965 Referring Provider:   Doristine Devoid Pulmonary Rehab Walk Test from 10/26/2022 in [facility]  Referring Provider [name]       Encounter Date: 11/17/2022  Check In:  Session Check In - 11/17/22 1029       Check-In   Supervising physician immediately available to respond to emergencies [facility] MD immediately available    Physician(s) Bernadene Person, NP    Location [facility]    Staff Present Raford Pitcher, MS, ACSM-CEP, Exercise Physiologist;Mary Gerre Scull, RN, BSN;Desare Duddy Katrinka Blazing, RT;Randi Reeve BS, ACSM-CEP, Exercise Physiologist    Virtual Visit No    Medication changes reported     No    Fall or balance concerns reported    No    Tobacco Cessation No Change    Warm-up and Cool-down Performed as group-led instruction    Resistance Training Performed Yes    VAD Patient? No    PAD/SET Patient? No      Pain Assessment   Currently in Pain? No/denies    Pain Score 0-No pain    Multiple Pain Sites No             Capillary Blood Glucose: No results found for this or any previous visit (from the past 24 hour(s)).    Social History   Tobacco Use  Smoking Status Former   Current packs/day: 0.00   Average packs/day: 1 pack/day for 40.6 years (40.6 ttl pk-yrs)   Types: Cigarettes   Start date: 03/05/1981   Quit date: 10/14/2021   Years since quitting: 1.0  Smokeless Tobacco Never    Goals Met:  Proper associated with RPD/PD & O2 Sat Independence with exercise equipment Exercise tolerated well No report of concerns or symptoms today Strength training completed today  Goals Unmet:  Not Applicable  Comments: Service time is from 1006 to 1133.    Dr. Mechele Collin is Medical Director for Pulmonary Rehab at [facility].

## 2022-11-21 ENCOUNTER — Other Ambulatory Visit: Payer: Self-pay | Admitting: Internal Medicine

## 2022-11-22 ENCOUNTER — Encounter (HOSPITAL_COMMUNITY): Payer: 59

## 2022-11-22 DIAGNOSIS — J449 Chronic obstructive pulmonary disease, unspecified: Secondary | ICD-10-CM | POA: Diagnosis not present

## 2022-11-22 DIAGNOSIS — F17201 Nicotine dependence, unspecified, in remission: Secondary | ICD-10-CM | POA: Diagnosis not present

## 2022-11-24 ENCOUNTER — Encounter (HOSPITAL_COMMUNITY)
Admission: RE | Admit: 2022-11-24 | Discharge: 2022-11-24 | Disposition: A | Discharge status: Home / Self Care | Payer: 59 | Source: Ambulatory Visit | Attending: Internal Medicine | Admitting: Internal Medicine

## 2022-11-24 ENCOUNTER — Telehealth: Payer: Self-pay

## 2022-11-24 VITALS — Wt 177.9 lb

## 2022-11-24 DIAGNOSIS — J449 Chronic obstructive pulmonary disease, unspecified: Secondary | ICD-10-CM

## 2022-11-24 DIAGNOSIS — R079 Chest pain, unspecified: Secondary | ICD-10-CM | POA: Diagnosis not present

## 2022-11-24 NOTE — Telephone Encounter (Signed)
Transition Care Management Follow-up Telephone Call Date of discharge and from where: [facility] 11/5 How have you been since you were released from the hospital? Patient is following up with PCP Any questions or concerns? No  Items Reviewed: Did the pt receive and understand the discharge instructions provided? Yes  Medications obtained and verified? Yes  Other? No  Any new allergies since your discharge? No  Dietary orders reviewed? No Do you have support at home? Yes     Follow up appointments reviewed:  PCP Hospital f/u appt confirmed? Yes  Scheduled to see PCP on 11/13@ . Specialist Hospital f/u appt confirmed? No  Scheduled to see  on  @ . Are transportation arrangements needed? No  If their condition worsens, is the pt aware to call PCP or go to the Emergency Dept.? Yes Was the patient provided with contact information for the PCP's office or ED? Yes Was to pt encouraged to call back with questions or concerns? Yes

## 2022-11-24 NOTE — Telephone Encounter (Signed)
Transition Care Management Unsuccessful Follow-up Telephone Call  Date of discharge and from where:  [facility] 11/5  Attempts:  1st Attempt  Reason for unsuccessful TCM follow-up call:  No answer/busy   Lenard Forth Casey  [facility], [facility], Phone: [phone] Website: [email or web address]

## 2022-11-24 NOTE — Progress Notes (Signed)
Daily Session Note  Patient Details  Name: Jill Glenn MRN: [id number] Date of Birth: 12-Feb-1965 Referring Provider:   Doristine Devoid Pulmonary Rehab Walk Test from 10/26/2022 in [facility]  Referring Provider [name]       Encounter Date: 11/24/2022  Check In:  Session Check In - 11/24/22 1037       Check-In   Supervising physician immediately available to respond to emergencies [facility] MD immediately available    Physician(s) Reather Littler, NP    Location [facility]    Staff Present Raford Pitcher, MS, ACSM-CEP, Exercise Physiologist;Mary Gerre Scull, RN, BSN;Delene Morais Katrinka Blazing, RT;Randi Reeve BS, ACSM-CEP, Exercise Physiologist;Samantha Belarus, RD, LDN    Virtual Visit No    Medication changes reported     No    Fall or balance concerns reported    No    Tobacco Cessation No Change    Warm-up and Cool-down Performed as group-led instruction    Resistance Training Performed Yes    VAD Patient? No    PAD/SET Patient? No      Pain Assessment   Currently in Pain? No/denies    Multiple Pain Sites No             Capillary Blood Glucose: No results found for this or any previous visit (from the past 24 hour(s)).    Social History   Tobacco Use  Smoking Status Former   Current packs/day: 0.00   Average packs/day: 1 pack/day for 40.6 years (40.6 ttl pk-yrs)   Types: Cigarettes   Start date: 03/05/1981   Quit date: 10/14/2021   Years since quitting: 1.1  Smokeless Tobacco Never    Goals Met:  Proper associated with RPD/PD & O2 Sat Independence with exercise equipment Exercise tolerated well No report of concerns or symptoms today Strength training completed today  Goals Unmet:  Not Applicable  Comments: Service time is from 1008 to 1144.    Dr. Mechele Collin is Medical Director for Pulmonary Rehab at [facility].

## 2022-11-28 ENCOUNTER — Ambulatory Visit (INDEPENDENT_AMBULATORY_CARE_PROVIDER_SITE_OTHER): Payer: 59 | Admitting: Family Medicine

## 2022-11-28 ENCOUNTER — Encounter: Payer: Self-pay | Admitting: Family Medicine

## 2022-11-28 ENCOUNTER — Ambulatory Visit (INDEPENDENT_AMBULATORY_CARE_PROVIDER_SITE_OTHER): Payer: 59 | Admitting: Mental Health

## 2022-11-28 VITALS — BP 102/66 | HR 94 | Temp 98.0°F | Resp 22 | Ht 63.0 in | Wt 177.0 lb

## 2022-11-28 DIAGNOSIS — F411 Generalized anxiety disorder: Secondary | ICD-10-CM | POA: Insufficient documentation

## 2022-11-28 DIAGNOSIS — J0110 Acute frontal sinusitis, unspecified: Secondary | ICD-10-CM | POA: Diagnosis not present

## 2022-11-28 DIAGNOSIS — F322 Major depressive disorder, single episode, severe without psychotic features: Secondary | ICD-10-CM | POA: Diagnosis not present

## 2022-11-28 DIAGNOSIS — F4310 Post-traumatic stress disorder, unspecified: Secondary | ICD-10-CM | POA: Insufficient documentation

## 2022-11-28 MED ORDER — AMOXICILLIN-POT CLAVULANATE 875-125 MG PO TABS: 1.0000 | ORAL_TABLET | Freq: Two times a day (BID) | ORAL | 0 refills | Status: AC
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
## 2022-11-28 NOTE — Progress Notes (Signed)
Assessment & Plan:  1. Acute non-recurrent frontal sinusitis Education provided on sinus infections.  Discussed symptom management.  Encouraged humidifier with Vicks at night. - amoxicillin-clavulanate (AUGMENTIN) 875-125 MG tablet; Take 1 tablet by mouth 2 (two) times daily for 7 days.  Dispense: 14 tablet; Refill: 0  No results found for any visits on 11/28/22.  Follow up plan: Return if symptoms worsen or fail to improve.  Deliah Boston, MSN, APRN, FNP-C  Subjective:  HPI: Jill Glenn is a 57 y.o. female presenting on 11/28/2022 for URI (Cough, congestion - green to clear, chills (no fever) - started Friday. /Hx COPD (Pulmonary Dr [name]) )  Patient complains of cough, head congestion, runny nose, and chills . She denies fever, shortness of breath, and wheezing. Onset of symptoms was 3 days ago, gradually worsening since that time. She is drinking plenty of fluids. Evaluation to date: none. Treatment to date:  inhalers and nebulizer . She has a history of COPD. She does not smoke.    ROS: Negative unless specifically indicated above in HPI.   Relevant past medical history reviewed and updated as indicated.   Allergies and medications reviewed and updated.   Current Outpatient Medications:    acetaminophen (TYLENOL) 325 MG tablet, Take 2 tablets (650 mg total) by mouth every 6 (six) hours as needed for moderate pain, mild pain or headache., Disp: , Rfl:    albuterol (PROVENTIL) (2.5 MG/3ML) 0.083% nebulizer solution, Take 3 mLs (2.5 mg total) by nebulization every 6 (six) hours as needed for wheezing or shortness of breath., Disp: 75 mL, Rfl: 12   albuterol (VENTOLIN HFA) 108 (90 Base) MCG/ACT inhaler, Inhale 2 puffs into the lungs every 6 (six) hours as needed for wheezing or shortness of breath., Disp: 18 g, Rfl: 3   Ascorbic Acid (VITAMIN C GUMMIES PO), Take 500 mg by mouth daily., Disp: , Rfl:    levocetirizine (XYZAL) 5 MG tablet, Take 1 tablet (5 mg total) by mouth  every evening., Disp: 30 tablet, Rfl: 11   montelukast (SINGULAIR) 10 MG tablet, Take 1 tablet (10 mg total) by mouth at bedtime., Disp: 30 tablet, Rfl: 11   Multiple Vitamin (MULTIVITAMIN ADULT) TABS, Take 1 tablet by mouth daily with breakfast., Disp: , Rfl:    rizatriptan (MAXALT) 10 MG tablet, Take 1 tablet (10 mg total) by mouth once as needed for migraine. May repeat in 2 hours if needed, Disp: 30 tablet, Rfl: 2   TRELEGY ELLIPTA 200-62.5-25 MCG/ACT AEPB, daily., Disp: , Rfl:    Ubrogepant (UBRELVY) 100 MG TABS, Take one tablet onset of Migraine May take another tablet 2 hours later, Don't exceed two tablets daily, Disp: 16 tablet, Rfl: 11   varenicline (CHANTIX) 1 MG tablet, Take 1 mg by mouth daily., Disp: , Rfl:    budesonide (PULMICORT) 0.5 MG/2ML nebulizer solution, Take 2 mLs (0.5 mg total) by nebulization in the morning and at bedtime. (Patient not taking: Reported on 11/16/2022), Disp: 60 mL, Rfl: 11   Glycopyrrolate-Formoterol (BEVESPI AEROSPHERE) 9-4.8 MCG/ACT AERO, Inhale 2 puffs into the lungs 2 (two) times daily. (Patient not taking: Reported on 11/16/2022), Disp: 1 each, Rfl: 11  Allergies  Allergen Reactions   Codeine Nausea And Vomiting, Palpitations and Other (See Comments)    Cold sweats, also    Objective:   BP 102/66   Pulse 94   Temp 98 F (36.7 C)   Resp (!) 22   Ht 5\' 3"  (1.6 m)   Wt 177 lb (  80.3 kg)   SpO2 98% Comment: on 2 liters oxygen  BMI 31.35 kg/m    Physical Exam Vitals reviewed.  Constitutional:      General: She is not in acute distress.    Appearance: Normal appearance. She is not ill-appearing, toxic-appearing or diaphoretic.  HENT:     Head: Normocephalic and atraumatic.     Right Ear: Tympanic membrane, ear canal and external ear normal. There is no impacted cerumen.     Left Ear: Tympanic membrane, ear canal and external ear normal. There is no impacted cerumen.     Nose: No congestion or rhinorrhea.     Right Sinus: Frontal sinus  tenderness present. No maxillary sinus tenderness.     Left Sinus: Frontal sinus tenderness present. No maxillary sinus tenderness.     Mouth/Throat:     Mouth: Mucous membranes are moist.     Pharynx: Oropharynx is clear. No oropharyngeal exudate or posterior oropharyngeal erythema.  Eyes:     General: No scleral icterus.       Right eye: No discharge.        Left eye: No discharge.     Conjunctiva/sclera: Conjunctivae normal.  Cardiovascular:     Rate and Rhythm: Normal rate and regular rhythm.     Heart sounds: Normal heart sounds. No murmur heard.    No friction rub. No gallop.  Pulmonary:     Effort: Pulmonary effort is normal. No respiratory distress.     Breath sounds: Normal breath sounds. No stridor. No wheezing, rhonchi or rales.  Musculoskeletal:        General: Normal range of motion.     Cervical back: Normal range of motion.  Lymphadenopathy:     Cervical: No cervical adenopathy.  Skin:    General: Skin is warm and dry.     Capillary Refill: Capillary refill takes less than 2 seconds.  Neurological:     General: No focal deficit present.     Mental Status: She is alert and oriented to person, place, and time. Mental status is at baseline.  Psychiatric:        Mood and Affect: Mood normal.        Behavior: Behavior normal.        Thought Content: Thought content normal.        Judgment: Judgment normal.

## 2022-11-29 ENCOUNTER — Encounter: Payer: Self-pay | Admitting: Internal Medicine

## 2022-11-29 ENCOUNTER — Encounter (HOSPITAL_COMMUNITY): Payer: Self-pay

## 2022-11-29 ENCOUNTER — Encounter (HOSPITAL_COMMUNITY): Payer: Self-pay | Admitting: Psychiatry

## 2022-11-29 ENCOUNTER — Ambulatory Visit (INDEPENDENT_AMBULATORY_CARE_PROVIDER_SITE_OTHER): Payer: 59 | Admitting: Psychiatry

## 2022-11-29 ENCOUNTER — Telehealth (HOSPITAL_COMMUNITY): Payer: Self-pay

## 2022-11-29 ENCOUNTER — Encounter (HOSPITAL_COMMUNITY): Payer: 59

## 2022-11-29 DIAGNOSIS — F4310 Post-traumatic stress disorder, unspecified: Secondary | ICD-10-CM

## 2022-11-29 DIAGNOSIS — F322 Major depressive disorder, single episode, severe without psychotic features: Secondary | ICD-10-CM

## 2022-11-29 DIAGNOSIS — Z8659 Personal history of other mental and behavioral disorders: Secondary | ICD-10-CM | POA: Diagnosis not present

## 2022-11-29 DIAGNOSIS — F411 Generalized anxiety disorder: Secondary | ICD-10-CM | POA: Diagnosis not present

## 2022-11-29 MED ORDER — HYDROXYZINE HCL 25 MG PO TABS: 12.5000 mg | ORAL_TABLET | Freq: Every evening | ORAL | 1 refills | Status: DC | PRN

## 2022-11-29 MED ORDER — BUSPIRONE HCL 7.5 MG PO TABS: 7.5000 mg | ORAL_TABLET | Freq: Two times a day (BID) | ORAL | 1 refills | Status: DC

## 2022-11-29 NOTE — Patient Instructions (Signed)
Thank you for attending your appointment today.  -- START Buspar 7.5 mg twice daily -- Continue other medications as prescribed.  Please do not make any changes to medications without first discussing with your provider. If you are experiencing a psychiatric emergency, please call 911 or present to your nearest emergency department. Additional crisis, medication management, and therapy resources are included below.  [facility]  [address] [place], [place] [address] [phone] WALK-IN URGENT CARE 24/7 FOR ANYONE [address], [place], [place]  [phone] Fax: [phone] [email or web address] *Interpreters available *Accepts all insurance and uninsured for Urgent Care needs *Accepts Medicaid and uninsured for outpatient treatment (below)      ONLY FOR [facility]  Below:    Outpatient New Patient Assessment/Therapy Walk-ins:        Monday, Wednesday, and Thursday 8am until slots are full (first come, first served)                   New Patient Psychiatry/Medication Management        Monday-Friday 8am-11am (first come, first served)               For all walk-ins we ask that you arrive by 7:15am, because patients will be seen in the order of arrival.

## 2022-11-29 NOTE — Telephone Encounter (Signed)
Phyillis called and left a vm. She stated " I can not make it to class today.I have a sinus infection and hope y'all have a great and happy thanksgiving." I have canceled her appt for today!

## 2022-11-30 NOTE — Progress Notes (Signed)
Pulmonary Individual Treatment Plan  Patient Details  Name: Jill Glenn MRN: [id number] Date of Birth: 1965-08-16 Referring Provider:   Doristine Devoid Pulmonary Rehab Walk Test from 10/26/2022 in [facility]  Referring Provider [name]       Initial Encounter Date:  Flowsheet Row Pulmonary Rehab Walk Test from 10/26/2022 in [facility]  Date 10/26/22       Visit Diagnosis: Stage 3 severe COPD by GOLD classification (HCC)  Patient's Home Medications on Admission:   Current Outpatient Medications:    acetaminophen (TYLENOL) 325 MG tablet, Take 2 tablets (650 mg total) by mouth every 6 (six) hours as needed for moderate pain, mild pain or headache., Disp: , Rfl:    albuterol (PROVENTIL) (2.5 MG/3ML) 0.083% nebulizer solution, Take 3 mLs (2.5 mg total) by nebulization every 6 (six) hours as needed for wheezing or shortness of breath., Disp: 75 mL, Rfl: 12   albuterol (VENTOLIN HFA) 108 (90 Base) MCG/ACT inhaler, Inhale 2 puffs into the lungs every 6 (six) hours as needed for wheezing or shortness of breath., Disp: 18 g, Rfl: 3   amoxicillin-clavulanate (AUGMENTIN) 875-125 MG tablet, Take 1 tablet by mouth 2 (two) times daily for 7 days., Disp: 14 tablet, Rfl: 0   Ascorbic Acid (VITAMIN C GUMMIES PO), Take 500 mg by mouth daily., Disp: , Rfl:    budesonide (PULMICORT) 0.5 MG/2ML nebulizer solution, Take 2 mLs (0.5 mg total) by nebulization in the morning and at bedtime. (Patient not taking: Reported on 11/16/2022), Disp: 60 mL, Rfl: 11   busPIRone (BUSPAR) 7.5 MG tablet, Take 1 tablet (7.5 mg total) by mouth 2 (two) times daily., Disp: 60 tablet, Rfl: 1   Glycopyrrolate-Formoterol (BEVESPI AEROSPHERE) 9-4.8 MCG/ACT AERO, Inhale 2 puffs into the lungs 2 (two) times daily. (Patient not taking: Reported on 11/16/2022), Disp: 1 each, Rfl: 11   hydrOXYzine (ATARAX) 25 MG  tablet, Take 0.5-1 tablets (12.5-25 mg total) by mouth at bedtime as needed (sleep)., Disp: 30 tablet, Rfl: 1   levocetirizine (XYZAL) 5 MG tablet, Take 1 tablet (5 mg total) by mouth every evening., Disp: 30 tablet, Rfl: 11   montelukast (SINGULAIR) 10 MG tablet, Take 1 tablet (10 mg total) by mouth at bedtime., Disp: 30 tablet, Rfl: 11   Multiple Vitamin (MULTIVITAMIN ADULT) TABS, Take 1 tablet by mouth daily with breakfast., Disp: , Rfl:    rizatriptan (MAXALT) 10 MG tablet, Take 1 tablet (10 mg total) by mouth once as needed for migraine. May repeat in 2 hours if needed, Disp: 30 tablet, Rfl: 2   TRELEGY ELLIPTA 200-62.5-25 MCG/ACT AEPB, daily., Disp: , Rfl:    Ubrogepant (UBRELVY) 100 MG TABS, Take one tablet onset of Migraine May take another tablet 2 hours later, Don't exceed two tablets daily, Disp: 16 tablet, Rfl: 11   varenicline (CHANTIX) 1 MG tablet, Take 1 mg by mouth daily., Disp: , Rfl:   Past Medical History: Past Medical History:  Diagnosis Date   Allergy    seasonal   Anxiety    Asthma    COPD (chronic obstructive pulmonary disease) (HCC)    Depression    Emphysema of lung (HCC)    PTSD (post-traumatic stress disorder)     Tobacco Use: Social History   Tobacco Use  Smoking Status Former   Current packs/day: 0.00   Average packs/day: 1 pack/day for 40.6 years (40.6 ttl pk-yrs)  Types: Cigarettes   Start date: 03/05/1981   Quit date: 10/14/2021   Years since quitting: 1.1  Smokeless Tobacco Never    Labs: Review Flowsheet  More data may exist      Latest Ref Rng & Units 11/02/2020 12/21/2020 03/24/2022 04/12/2022 11/16/2022  Labs for ITP Cardiac and Pulmonary Rehab  Cholestrol 0 - 200 mg/dL - 086  - 578  -  LDL (calc) 0 - 99 mg/dL - 91  - 469  -  HDL-C >62.95 mg/dL - 28.41  - 32.44  -  Trlycerides 0.0 - 149.0 mg/dL - 01.0  - 272.5  -  Hemoglobin A1c 4.6 - 6.5 % - - 6.0  - 5.9   Bicarbonate 20.0 - 28.0 mmol/L 22.2  - - - -  TCO2 22 - 32 mmol/L 23  - - - -   Acid-base deficit 0.0 - 2.0 mmol/L 3.0  - - - -  O2 Saturation % 90.0  - - - -    Details            Capillary Blood Glucose: Lab Results  Component Value Date   GLUCAP 73 03/26/2022   GLUCAP 148 (H) 03/25/2022   GLUCAP 166 (H) 03/25/2022   GLUCAP 88 03/25/2022   GLUCAP 84 03/25/2022     Pulmonary Assessment Scores:  Pulmonary Assessment Scores     Row Name 10/26/22 1135         ADL UCSD   ADL Phase Entry     SOB Score total 54       CAT Score   CAT Score 16       mMRC Score   mMRC Score 4             UCSD: Self-administered rating of dyspnea associated with activities of daily living (ADLs) 6-point scale (0 = "not at all" to 5 = "maximal or unable to do because of breathlessness")  Scoring Scores range from 0 to 120.  Minimally important difference is 5 units  CAT: CAT can identify the health impairment of COPD patients and is better correlated with disease progression.  CAT has a scoring range of zero to 40. The CAT score is classified into four groups of low (less than 10), medium (10 - 20), high (21-30) and very high (31-40) based on the impact level of disease on health status. A CAT score over 10 suggests significant symptoms.  A worsening CAT score could be explained by an exacerbation, poor medication adherence, poor inhaler technique, or progression of COPD or comorbid conditions.  CAT MCID is 2 points  mMRC: mMRC (Modified Medical Research Council) Dyspnea Scale is used to assess the degree of baseline functional disability in patients of respiratory disease due to dyspnea. No minimal important difference is established. A decrease in score of 1 point or greater is considered a positive change.   Pulmonary Function Assessment:  Pulmonary Function Assessment - 10/26/22 1109       Breath   Bilateral Breath Sounds Clear;Decreased    Shortness of Breath Limiting activity;Panic with Shortness of Breath   only panics if she doesn't have her inhaler  with her            Exercise Target Goals: Exercise Program Goal: Individual exercise prescription set using results from initial 6 min walk test and THRR while considering  patient's activity barriers and safety.   Exercise Prescription Goal: Initial exercise prescription builds to 30-45 minutes a day of aerobic activity, 2-3 days per  week.  Home exercise guidelines will be given to patient during program as part of exercise prescription that the participant will acknowledge.  Activity Barriers & Risk Stratification:  Activity Barriers & Cardiac Risk Stratification - 10/26/22 1107       Activity Barriers & Cardiac Risk Stratification   Activity Barriers Deconditioning;Muscular Weakness;Shortness of Breath;Arthritis;Back Problems    Cardiac Risk Stratification Moderate             6 Minute Walk:  6 Minute Walk     Row Name 10/26/22 1201         6 Minute Walk   Phase Initial     Distance 1125 feet     Walk Time 6 minutes     # of Rest Breaks 1  2:12-3:00     MPH 2.13     METS 2.98     RPE 14.5     Perceived Dyspnea  3     VO2 Peak 10.43     Symptoms No     Resting HR 74 bpm     Resting BP 106/70     Resting Oxygen Saturation  95 %     Exercise Oxygen Saturation  during 6 min walk 84 %     Max Ex. HR 107 bpm     Max Ex. BP 110/70     2 Minute Post BP 106/60       Interval HR   1 Minute HR 83     2 Minute HR 85     3 Minute HR 90     4 Minute HR 89     5 Minute HR 107     6 Minute HR 98     2 Minute Post HR 76     Interval Heart Rate? Yes       Interval Oxygen   Interval Oxygen? Yes     Baseline Oxygen Saturation % 95 %     1 Minute Oxygen Saturation % 97 %     1 Minute Liters of Oxygen 0 L     2 Minute Oxygen Saturation % 89 %     2 Minute Liters of Oxygen 0 L     3 Minute Oxygen Saturation % 94 %  2:12 84%     3 Minute Liters of Oxygen 0 L  increased to 2L     4 Minute Oxygen Saturation % 92 %     4 Minute Liters of Oxygen 2 L     5 Minute  Oxygen Saturation % 92 %     5 Minute Liters of Oxygen 2 L     6 Minute Oxygen Saturation % 89 %     6 Minute Liters of Oxygen 2 L     2 Minute Post Oxygen Saturation % 97 %     2 Minute Post Liters of Oxygen 2 L              Oxygen Initial Assessment:  Oxygen Initial Assessment - 10/26/22 1108       Home Oxygen   Home Oxygen Device Home Concentrator;Portable Concentrator;E-Tanks    Sleep Oxygen Prescription Continuous    Liters per minute 2    Home Exercise Oxygen Prescription Pulsed    Liters per minute 2    Home Resting Oxygen Prescription None    Compliance with Home Oxygen Use Yes      Initial 6 min Walk   Oxygen Used Continuous    Liters per  minute 2      Program Oxygen Prescription   Program Oxygen Prescription Continuous    Liters per minute 2      Intervention   Short Term Goals To learn and exhibit compliance with exercise, home and travel O2 prescription;To learn and understand importance of monitoring SPO2 with pulse oximeter and demonstrate accurate use of the pulse oximeter.;To learn and understand importance of maintaining oxygen saturations>88%;To learn and demonstrate proper pursed lip breathing techniques or other breathing techniques. ;To learn and demonstrate proper use of respiratory medications    Long  Term Goals Exhibits compliance with exercise, home  and travel O2 prescription;Maintenance of O2 saturations>88%;Compliance with respiratory medication;Verbalizes importance of monitoring SPO2 with pulse oximeter and return demonstration;Exhibits proper breathing techniques, such as pursed lip breathing or other method taught during program session;Demonstrates proper use of MDI's             Oxygen Re-Evaluation:  Oxygen Re-Evaluation     Row Name 10/31/22 0848 11/23/22 1202           Program Oxygen Prescription   Program Oxygen Prescription Continuous Continuous      Liters per minute 2 2        Home Oxygen   Home Oxygen Device Home  Concentrator;Portable Concentrator;E-Tanks Home Concentrator;Portable Concentrator;E-Tanks      Sleep Oxygen Prescription Continuous Continuous      Liters per minute 2 2      Home Exercise Oxygen Prescription Pulsed Pulsed      Liters per minute 2 2      Home Resting Oxygen Prescription None None      Compliance with Home Oxygen Use Yes Yes        Goals/Expected Outcomes   Short Term Goals To learn and exhibit compliance with exercise, home and travel O2 prescription;To learn and understand importance of monitoring SPO2 with pulse oximeter and demonstrate accurate use of the pulse oximeter.;To learn and understand importance of maintaining oxygen saturations>88%;To learn and demonstrate proper pursed lip breathing techniques or other breathing techniques. ;To learn and demonstrate proper use of respiratory medications To learn and exhibit compliance with exercise, home and travel O2 prescription;To learn and understand importance of monitoring SPO2 with pulse oximeter and demonstrate accurate use of the pulse oximeter.;To learn and understand importance of maintaining oxygen saturations>88%;To learn and demonstrate proper pursed lip breathing techniques or other breathing techniques. ;To learn and demonstrate proper use of respiratory medications      Long  Term Goals Exhibits compliance with exercise, home  and travel O2 prescription;Maintenance of O2 saturations>88%;Compliance with respiratory medication;Verbalizes importance of monitoring SPO2 with pulse oximeter and return demonstration;Exhibits proper breathing techniques, such as pursed lip breathing or other method taught during program session;Demonstrates proper use of MDI's Exhibits compliance with exercise, home  and travel O2 prescription;Maintenance of O2 saturations>88%;Compliance with respiratory medication;Verbalizes importance of monitoring SPO2 with pulse oximeter and return demonstration;Exhibits proper breathing techniques, such as  pursed lip breathing or other method taught during program session;Demonstrates proper use of MDI's      Goals/Expected Outcomes Compliance and understanding of oxygen saturations monitoring and breathing techniques to decrease shortness of breath. Compliance and understanding of oxygen saturations monitoring and breathing techniques to decrease shortness of breath.               Oxygen Discharge (Final Oxygen Re-Evaluation):  Oxygen Re-Evaluation - 11/23/22 1202       Program Oxygen Prescription   Program Oxygen Prescription Continuous    Liters  per minute 2      Home Oxygen   Home Oxygen Device Home Concentrator;Portable Concentrator;E-Tanks    Sleep Oxygen Prescription Continuous    Liters per minute 2    Home Exercise Oxygen Prescription Pulsed    Liters per minute 2    Home Resting Oxygen Prescription None    Compliance with Home Oxygen Use Yes      Goals/Expected Outcomes   Short Term Goals To learn and exhibit compliance with exercise, home and travel O2 prescription;To learn and understand importance of monitoring SPO2 with pulse oximeter and demonstrate accurate use of the pulse oximeter.;To learn and understand importance of maintaining oxygen saturations>88%;To learn and demonstrate proper pursed lip breathing techniques or other breathing techniques. ;To learn and demonstrate proper use of respiratory medications    Long  Term Goals Exhibits compliance with exercise, home  and travel O2 prescription;Maintenance of O2 saturations>88%;Compliance with respiratory medication;Verbalizes importance of monitoring SPO2 with pulse oximeter and return demonstration;Exhibits proper breathing techniques, such as pursed lip breathing or other method taught during program session;Demonstrates proper use of MDI's    Goals/Expected Outcomes Compliance and understanding of oxygen saturations monitoring and breathing techniques to decrease shortness of breath.             Initial  Exercise Prescription:  Initial Exercise Prescription - 10/26/22 1200       Date of Initial Exercise RX and Referring Provider   Date 10/26/22    Referring Provider [name]    Expected Discharge Date 01/19/23      Oxygen   Oxygen Continuous    Liters 2    Maintain Oxygen Saturation 88% or higher      Treadmill   MPH 1.7    Grade 0    Minutes 15      Recumbant Elliptical   Level 1    Minutes 15    METs 1.5      Prescription Details   Frequency (times per week) 2    Duration Progress to 30 minutes of continuous aerobic without signs/symptoms of physical distress      Intensity   THRR 40-80% of Max Heartrate 66-131    Ratings of Perceived Exertion 11-13    Perceived Dyspnea 0-4      Progression   Progression Continue progressive overload as per policy without signs/symptoms or physical distress.      Resistance Training   Training Prescription Yes    Weight blue bands    Reps 10-15             Perform Capillary Blood Glucose checks as needed.  Exercise Prescription Changes:   Exercise Prescription Changes     Row Name 11/01/22 1200 11/15/22 1100 11/24/22 0941         Response to Exercise   Blood Pressure (Admit) 110/77 104/70 117/75     Blood Pressure (Exercise) 109/83 112/70 --     Blood Pressure (Exit) 97/67 104/68 96/58     Heart Rate (Admit) 77 bpm 80 bpm 81 bpm     Heart Rate (Exercise) 104 bpm 98 bpm 97 bpm     Heart Rate (Exit) 83 bpm 91 bpm 91 bpm     Oxygen Saturation (Admit) 99 % 97 % 95 %     Oxygen Saturation (Exercise) 96 % 93 % 91 %     Oxygen Saturation (Exit) 97 % 97 % 97 %     Rating of Perceived Exertion (Exercise) 13 13 12.5  Perceived Dyspnea (Exercise) 2 2 3      Duration Continue with 30 min of aerobic exercise without signs/symptoms of physical distress. Continue with 30 min of aerobic exercise without signs/symptoms of physical distress. Continue with 30 min of aerobic exercise without signs/symptoms of physical distress.      Intensity THRR unchanged THRR unchanged THRR unchanged       Progression   Progression Continue to progress workloads to maintain intensity without signs/symptoms of physical distress. Continue to progress workloads to maintain intensity without signs/symptoms of physical distress. Continue to progress workloads to maintain intensity without signs/symptoms of physical distress.       Resistance Training   Training Prescription Yes Yes Yes     Weight blue bands blue bands blue bands     Reps 10-15 10-15 10-15     Time 10 Minutes 10 Minutes 10 Minutes       Oxygen   Oxygen Continuous Continuous Continuous     Liters 2 2 2        Treadmill   MPH 1 1.1 2.1     Grade 0 0 1     Minutes 15 15 15      METs 1.6 2.1 2.2       Recumbant Elliptical   Level 1 1 2      Minutes 15 15 15      METs 2.1 2.6 2.6       Oxygen   Maintain Oxygen Saturation 88% or higher 88% or higher 88% or higher              Exercise Comments:   Exercise Comments     Row Name 11/01/22 1208           Exercise Comments Pt completed 1st day of exercise. She exercised for 15 min on the treadmill and recumbent elliptical. She averaged 1.6 METs at 1 mph and 0 incline on the treadmill and 2.1 METs at level 1 on the recumbent elliptical. Rhylan performed the warmup and cooldown standing without limitations. Alvenia did not tolerate the treadmill well and will be transfered to the track. Pt understands METs from previous participating.                Exercise Goals and Review:   Exercise Goals     Row Name 10/26/22 1107             Exercise Goals   Increase Physical Activity Yes       Intervention Provide advice, education, support and counseling about physical activity/exercise needs.;Develop an individualized exercise prescription for aerobic and resistive training based on initial evaluation findings, risk stratification, comorbidities and participant's personal goals.       Expected Outcomes Short  Term: Attend rehab on a regular basis to increase amount of physical activity.;Long Term: Add in home exercise to make exercise part of routine and to increase amount of physical activity.;Long Term: Exercising regularly at least 3-5 days a week.       Increase Strength and Stamina Yes       Intervention Provide advice, education, support and counseling about physical activity/exercise needs.;Develop an individualized exercise prescription for aerobic and resistive training based on initial evaluation findings, risk stratification, comorbidities and participant's personal goals.       Expected Outcomes Short Term: Increase workloads from initial exercise prescription for resistance, speed, and METs.;Short Term: Perform resistance training exercises routinely during rehab and add in resistance training at home;Long Term: Improve cardiorespiratory fitness, muscular endurance and strength as  measured by increased METs and functional capacity ( )       Able to understand and use rate of perceived exertion (RPE) scale Yes       Intervention Provide education and explanation on how to use RPE scale       Expected Outcomes Short Term: Able to use RPE daily in rehab to express subjective intensity level;Long Term:  Able to use RPE to guide intensity level when exercising independently       Able to understand and use Dyspnea scale Yes       Intervention Provide education and explanation on how to use Dyspnea scale       Expected Outcomes Short Term: Able to use Dyspnea scale daily in rehab to express subjective sense of shortness of breath during exertion;Long Term: Able to use Dyspnea scale to guide intensity level when exercising independently       Knowledge and understanding of Target Heart Rate Range (THRR) Yes       Intervention Provide education and explanation of THRR including how the numbers were predicted and where they are located for reference       Expected Outcomes Short Term: Able to  state/look up THRR;Long Term: Able to use THRR to govern intensity when exercising independently;Short Term: Able to use daily as guideline for intensity in rehab       Understanding of Exercise Prescription Yes       Intervention Provide education, explanation, and written materials on patient's individual exercise prescription       Expected Outcomes Short Term: Able to explain program exercise prescription;Long Term: Able to explain home exercise prescription to exercise independently                Exercise Goals Re-Evaluation :  Exercise Goals Re-Evaluation     Row Name 10/31/22 0844 11/23/22 1159           Exercise Goal Re-Evaluation   Exercise Goals Review Increase Physical Activity;Able to understand and use Dyspnea scale;Understanding of Exercise Prescription;Increase Strength and Stamina;Knowledge and understanding of Target Heart Rate Range (THRR);Able to understand and use rate of perceived exertion (RPE) scale Increase Physical Activity;Able to understand and use Dyspnea scale;Understanding of Exercise Prescription;Increase Strength and Stamina;Knowledge and understanding of Target Heart Rate Range (THRR);Able to understand and use rate of perceived exertion (RPE) scale      Comments Helaine is scheduled to begin Pulmonary Rehab again on 10/29. Will continue to monitor and progress as able. Jolanda has completed 6 exercise sessions. She exercises for 15 min on the recumbent elliptical and treadmill. She averages 2.6 METs at level 2 on the recumbent elliptical and 2.1 METs at 1.7 mph on the treadmill. She performs the warmup and cooldown standing without limitations. Harleyann has increased her workload for the recumbent elliptical as METs have remained the same. Armatha has also progressed to treadmill walking. She did not tolerate treadmill walking on her first day of exercise but does now. Will continue to monitor and progress as able.      Expected Outcomes Through exercise at rehab  and home, the patient will decrease shortness of breath with daily activities and feel confident in carrying out an exercise regimen at home. Through exercise at rehab and home, the patient will decrease shortness of breath with daily activities and feel confident in carrying out an exercise regimen at home.               Discharge Exercise Prescription (Final Exercise Prescription Changes):  Exercise Prescription Changes - 11/24/22 0941       Response to Exercise   Blood Pressure (Admit) 117/75    Blood Pressure (Exit) 96/58    Heart Rate (Admit) 81 bpm    Heart Rate (Exercise) 97 bpm    Heart Rate (Exit) 91 bpm    Oxygen Saturation (Admit) 95 %    Oxygen Saturation (Exercise) 91 %    Oxygen Saturation (Exit) 97 %    Rating of Perceived Exertion (Exercise) 12.5    Perceived Dyspnea (Exercise) 3    Duration Continue with 30 min of aerobic exercise without signs/symptoms of physical distress.    Intensity THRR unchanged      Progression   Progression Continue to progress workloads to maintain intensity without signs/symptoms of physical distress.      Resistance Training   Training Prescription Yes    Weight blue bands    Reps 10-15    Time 10 Minutes      Oxygen   Oxygen Continuous    Liters 2      Treadmill   MPH 2.1    Grade 1    Minutes 15    METs 2.2      Recumbant Elliptical   Level 2    Minutes 15    METs 2.6      Oxygen   Maintain Oxygen Saturation 88% or higher             Nutrition:  Target Goals: Understanding of nutrition guidelines, daily intake of sodium 1500mg , cholesterol 200mg , calories 30% from fat and 7% or less from saturated fats, daily to have 5 or more servings of fruits and vegetables.  Biometrics:    Nutrition Therapy Plan and Nutrition Goals:  Nutrition Therapy & Goals - 11/29/22 1532       Nutrition Therapy   Diet General Healthy Diet      Personal Nutrition Goals   Nutrition Goal Patient to improve dietary quality  by using the plate method as a daily guide for meal planning to include lean protein/plant protein, fruits, vegetables, whole grains, and nonfat/low fat dairy as part of well balanced diet   goal in progress.   Personal Goal #2 Patient to identify strategies for weight loss of 0.5-2.0# per week of weight loss.   goal not met.   Comments Goals in progress. Ariyana has medical history of pulmonary HTN, COPD3, chronic respiratory failure. Per [facility] transplant RD documentation on 10/05/22, it is recommended that she lose weight to BMI 30/170# and ultimately BMI 27/150#. She does report history of over snacking on refined carbohydates, snacking in the middle of the night, etc. She has previously completed pulmonary rehab in February 2024; her starting weight at that time was 69.8kg/153.6#. She reports using [employer]itness Pal app to aid with tracking calories with goal set at ~1200kcals per patient. She has maintained her weight since starting with our program. She does acknowledge some emotional eating/binge type eating tendencies; she will begin seeing a behavioral health counselor.  Ashayla would continue to benefit from weight loss and decrease intake of refined carbohydrates to support pulmonary disease.      Intervention Plan   Intervention Prescribe, educate and counsel regarding individualized specific dietary modifications aiming towards targeted core components such as weight, hypertension, lipid management, diabetes, heart failure and other comorbidities.;Nutrition handout(s) given to patient.    Expected Outcomes Short Term Goal: Understand basic principles of dietary content, such as calories, fat, sodium, cholesterol and nutrients.;Long Term Goal:  Adherence to prescribed nutrition plan.             Nutrition Assessments:  MEDIFICTS Score Key: >=70 Need to make dietary changes  40-70 Heart Healthy Diet <= 40 Therapeutic Level Cholesterol Diet  Flowsheet Row PULMONARY REHAB CHRONIC OBSTRUCTIVE  PULMONARY DISEASE from 02/22/2022 in [facility]  Picture Your Plate Total Score on Discharge 50      Picture Your Plate Scores: <16 Unhealthy dietary pattern with much room for improvement. 41-50 Dietary pattern unlikely to meet recommendations for good health and room for improvement. 51-60 More healthful dietary pattern, with some room for improvement.  >60 Healthy dietary pattern, although there may be some specific behaviors that could be improved.    Nutrition Goals Re-Evaluation:  Nutrition Goals Re-Evaluation     Row Name 11/01/22 1307 11/29/22 1532           Goals   Current Weight 177 lb 14.6 oz (80.7 kg) 177 lb 14.6 oz (80.7 kg)      Comment LDL 134, cholesterol 208, X0R 5.8 A1c 5.9; other most recent labs  LDL 134, cholesterol 208      Expected Outcome Tilden has medical history of pulmonary HTN, COPD3, chronic respiratory failure. Per [facility] transplant RD documentation on 10/05/22, it is recommended that she lose weight to BMI 30/170# and ultimately BMI 27/150#. She does report history of over snacking on refined carbohydates, snacking in the middle of the night, etc. She has previously completed pulmonary rehab in February 2024; her starting weight at that time was 69.8kg/153.6#. She reports using My[employer]itness Pal app to aid with tracking calories with goal set at ~1200kcals per patient. Kynsley would continue to benefit from weight loss and decrease intake of refined carbohydrates to support pulmonary disease. Goals in progress. Noele has medical history of pulmonary HTN, COPD3, chronic respiratory failure. Per [facility] transplant RD documentation on 10/05/22, it is recommended that she lose weight to BMI 30/170# and ultimately BMI 27/150#. She does report history of over snacking on refined carbohydates, snacking in the middle of the night, etc. She has previously completed pulmonary rehab in February 2024; her starting weight at  that time was 69.8kg/153.6#. She reports using [employer] Pal app to aid with tracking calories with goal set at ~1200kcals per patient. She has maintained her weight since starting with our program. She does acknowledge some emotional eating/binge type eating tendencies; she will begin seeing a behavioral health counselor. Shaniya would continue to benefit from weight loss and decrease intake of refined carbohydrates to support pulmonary disease.               Nutrition Goals Discharge (Final Nutrition Goals Re-Evaluation):  Nutrition Goals Re-Evaluation - 11/29/22 1532       Goals   Current Weight 177 lb 14.6 oz (80.7 kg)    Comment A1c 5.9; other most recent labs  LDL 134, cholesterol 208    Expected Outcome Goals in progress. Danasha has medical history of pulmonary HTN, COPD3, chronic respiratory failure. Per [facility] transplant RD documentation on 10/05/22, it is recommended that she lose weight to BMI 30/170# and ultimately BMI 27/150#. She does report history of over snacking on refined carbohydates, snacking in the middle of the night, etc. She has previously completed pulmonary rehab in February 2024; her starting weight at that time was 69.8kg/153.6#. She reports using My[employer]itness Pal app to aid with tracking calories with goal set at ~1200kcals per patient. She has  maintained her weight since starting with our program. She does acknowledge some emotional eating/binge type eating tendencies; she will begin seeing a behavioral health counselor. Skyah would continue to benefit from weight loss and decrease intake of refined carbohydrates to support pulmonary disease.             Psychosocial: Target Goals: Acknowledge presence or absence of significant depression and/or stress, maximize coping skills, provide positive support system. Participant is able to verbalize types and ability to use techniques and skills needed for reducing stress and depression.  Initial Review & Psychosocial  Screening:  Initial Psych Review & Screening - 10/26/22 1103       Initial Review   Current issues with Current Depression;Current Anxiety/Panic;History of Depression;Current Stress Concerns    Comments Pt is stressed about her health and the ability to not work anymore.      Family Dynamics   Good Support System? Yes    Comments Husband, Alessandra Bevels, 2 daughters      Barriers   Psychosocial barriers to participate in program Psychosocial barriers identified (see note);The patient should benefit from training in stress management and relaxation.      Screening Interventions   Interventions Encouraged to exercise;To provide support and resources with identified psychosocial needs    Expected Outcomes Short Term goal: Utilizing psychosocial counselor, staff and physician to assist with identification of specific Stressors or current issues interfering with healing process. Setting desired goal for each stressor or current issue identified.;Long Term Goal: Stressors or current issues are controlled or eliminated.;Short Term goal: Identification and review with participant of any Quality of Life or Depression concerns found by scoring the questionnaire.;Long Term goal: The participant improves quality of Life and PHQ9 Scores as seen by post scores and/or verbalization of changes             Quality of Life Scores:  Scores of 19 and below usually indicate a poorer quality of life in these areas.  A difference of  2-3 points is a clinically meaningful difference.  A difference of 2-3 points in the total score of the Quality of Life Index has been associated with significant improvement in overall quality of life, self-image, physical symptoms, and general health in studies assessing change in quality of life.  PHQ-9: Review Flowsheet  More data exists      11/28/2022 10/26/2022 02/22/2022 11/22/2021 03/22/2021  Depression screen PHQ 2/9  Decreased Interest 0  2 1 1 1   Down, Depressed,  Hopeless 0  2 0 1 1  PHQ - 2 Score 0  4 1 2 2   Altered sleeping  3 1 2 3   Tired, decreased energy  3 1 1 3   Change in appetite  3 1 2 1   Feeling bad or failure about yourself   3 2 3  0  Trouble concentrating  0 0 0 0  Moving slowly or fidgety/restless  0 0 1 0  Suicidal thoughts  0 0 0 0  PHQ-9 Score  16 6 11 9   Difficult doing work/chores  Somewhat difficult Not difficult at all Somewhat difficult -    Details       Information is confidential and restricted. Go to Review Flowsheets to unlock data.   Multiple values from one day are sorted in reverse-chronological order        Interpretation of Total Score  Total Score Depression Severity:  1-4 = Minimal depression, 5-9 = Mild depression, 10-14 = Moderate depression, 15-19 = Moderately severe depression,  20-27 = Severe depression   Psychosocial Evaluation and Intervention:  Psychosocial Evaluation - 10/26/22 1105       Psychosocial Evaluation & Interventions   Interventions Stress management education;Encouraged to exercise with the program and follow exercise prescription    Comments Kayah is currently stressed about her health and not being able to work anymore. 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 She has finally been approved for disability so this has helped to decrease some of her stress. She is currently being worked up for a lung transplant at [facility]. She does admit to feeling depressed, but states she is getting ready to start therapy. She is also going to ask her therapist about starting psychotropic meds.    Expected Outcomes For Naama to participate in PR free of any psychosocial barreris or concerns    Continue Psychosocial Services  Follow up required by staff             Psychosocial Re-Evaluation:  Psychosocial Re-Evaluation     Row Name 10/28/22 1521 11/23/22 1130           Psychosocial Re-Evaluation   Current issues with Current Stress Concerns;Current  Anxiety/Panic;History of Depression;Current Depression Current Stress Concerns;Current Anxiety/Panic;History of Depression;Current Depression      Comments No changes since orientation on 10/26/22. Jennine is scheduled to start PR on 10/29 Maryna has met with her psychiatrist and has also been referred to a counseler. No needs at this time.      Expected Outcomes For Aritzy to attend the program without psychosocial issues or concerns. For Patsye to attend the program with less psychosocial issues or concerns.      Interventions Encouraged to attend Pulmonary Rehabilitation for the exercise;Stress management education;Relaxation education Encouraged to attend Pulmonary Rehabilitation for the exercise      Continue Psychosocial Services  Follow up required by staff Follow up required by staff               Psychosocial Discharge (Final Psychosocial Re-Evaluation):  Psychosocial Re-Evaluation - 11/23/22 1130       Psychosocial Re-Evaluation   Current issues with Current Stress Concerns;Current Anxiety/Panic;History of Depression;Current Depression    Comments Jermanie has met with her psychiatrist and has also been referred to a counseler. No needs at this time.    Expected Outcomes For Caree to attend the program with less psychosocial issues or concerns.    Interventions Encouraged to attend Pulmonary Rehabilitation for the exercise    Continue Psychosocial Services  Follow up required by staff             Education: Education Goals: Education classes will be provided on a weekly basis, covering required topics. Participant will state understanding/return demonstration of topics presented.  Learning Barriers/Preferences:  Learning Barriers/Preferences - 10/26/22 1105       Learning Barriers/Preferences   Learning Barriers None    Learning Preferences Audio;Group Instruction;Individual Instruction;Verbal Instruction;Video;Written Material             Education Topics: Know  Your Numbers Group instruction that is supported by a PowerPoint presentation. Instructor discusses importance of knowing and understanding resting, exercise, and post-exercise oxygen saturation, heart rate, and blood pressure. Oxygen saturation, heart rate, blood pressure, rating of perceived exertion, and dyspnea are reviewed along with a normal range for these values.    Exercise for the Pulmonary Patient Group instruction that is supported by a PowerPoint presentation. Instructor discusses benefits of exercise, core components of exercise, frequency,  duration, and intensity of an exercise routine, importance of utilizing pulse oximetry during exercise, safety while exercising, and options of places to exercise outside of rehab.  Flowsheet Row PULMONARY REHAB CHRONIC OBSTRUCTIVE PULMONARY DISEASE from 12/30/2021 in [facility] for Heart, Vascular, & Lung Health  Date 12/30/21  Educator EP  Instruction Review Code 1- Verbalizes Understanding       MET Level  Group instruction provided by PowerPoint, verbal discussion, and written material to support subject matter. Instructor reviews what METs are and how to increase METs.  Flowsheet Row PULMONARY REHAB CHRONIC OBSTRUCTIVE PULMONARY DISEASE from 11/24/2022 in [facility] for Heart, Vascular, & Lung Health  Date 11/24/22  Educator EP  Instruction Review Code 1- Verbalizes Understanding       Pulmonary Medications Verbally interactive group education provided by instructor with focus on inhaled medications and proper administration.   Anatomy and Physiology of the Respiratory System Group instruction provided by PowerPoint, verbal discussion, and written material to support subject matter. Instructor reviews respiratory cycle and anatomical components of the respiratory system and their functions. Instructor also reviews differences in obstructive and restrictive respiratory diseases with  examples of each.    Oxygen Safety Group instruction provided by PowerPoint, verbal discussion, and written material to support subject matter. There is an overview of "What is Oxygen" and "Why do we need it".  Instructor also reviews how to create a safe environment for oxygen use, the importance of using oxygen as prescribed, and the risks of noncompliance. There is a brief discussion on traveling with oxygen and resources the patient may utilize. Flowsheet Row PULMONARY REHAB CHRONIC OBSTRUCTIVE PULMONARY DISEASE from 01/27/2022 in [facility] for Heart, Vascular, & Lung Health  Date 01/27/22  Educator RN  Instruction Review Code 1- Verbalizes Understanding       Oxygen Use Group instruction provided by PowerPoint, verbal discussion, and written material to discuss how supplemental oxygen is prescribed and different types of oxygen supply systems. Resources for more information are provided.    Breathing Techniques Group instruction that is supported by demonstration and informational handouts. Instructor discusses the benefits of pursed lip and diaphragmatic breathing and detailed demonstration on how to perform both.  Flowsheet Row PULMONARY REHAB CHRONIC OBSTRUCTIVE PULMONARY DISEASE from 02/10/2022 in [facility] for Heart, Vascular, & Lung Health  Date 02/10/22  Educator EP  Instruction Review Code 1- Verbalizes Understanding  [Handout also provided]        Risk Factor Reduction Group instruction that is supported by a PowerPoint presentation. Instructor discusses the definition of a risk factor, different risk factors for pulmonary disease, and how the heart and lungs work together. Flowsheet Row PULMONARY REHAB CHRONIC OBSTRUCTIVE PULMONARY DISEASE from 11/17/2022 in [facility] for Heart, Vascular, & Lung Health  Date 11/17/22  Educator EP  Instruction Review Code 1- Verbalizes Understanding        Pulmonary Diseases Group instruction provided by PowerPoint, verbal discussion, and written material to support subject matter. Instructor gives an overview of the different type of pulmonary diseases. There is also a discussion on risk factors and symptoms as well as ways to manage the diseases.   Stress and Energy Conservation Group instruction provided by PowerPoint, verbal discussion, and written material to support subject matter. Instructor gives an overview of stress and the impact it can have on the body. Instructor also reviews ways to reduce stress. There is also a discussion  on energy conservation and ways to conserve energy throughout the day. Flowsheet Row PULMONARY REHAB CHRONIC OBSTRUCTIVE PULMONARY DISEASE from 11/03/2022 in [facility] for Heart, Vascular, & Lung Health  Date 11/03/22  Educator RN  Instruction Review Code 1- Verbalizes Understanding       Warning Signs and Symptoms Group instruction provided by PowerPoint, verbal discussion, and written material to support subject matter. Instructor reviews warning signs and symptoms of stroke, heart attack, cold and flu. Instructor also reviews ways to prevent the spread of infection. Flowsheet Row PULMONARY REHAB CHRONIC OBSTRUCTIVE PULMONARY DISEASE from 11/10/2022 in [facility] for Heart, Vascular, & Lung Health  Date 11/10/22  Educator RN  Instruction Review Code 1- Verbalizes Understanding       Other Education Group or individual verbal, written, or video instructions that support the educational goals of the pulmonary rehab program. Flowsheet Row PULMONARY REHAB CHRONIC OBSTRUCTIVE PULMONARY DISEASE from 03/01/2022 in [facility] for Heart, Vascular, & Lung Health  Date 03/01/22  Educator EP  Instruction Review Code 1- Verbalizes Understanding        Knowledge Questionnaire Score:  Knowledge Questionnaire Score - 10/26/22 1135        Knowledge Questionnaire Score   Pre Score 17/18             Core Components/Risk Factors/Patient Goals at Admission:  Personal Goals and Risk Factors at Admission - 10/26/22 1105       Core Components/Risk Factors/Patient Goals on Admission    Weight Management Yes;Weight Loss   pt wants to lose 10lbs   Intervention Weight Management: Develop a combined nutrition and exercise program designed to reach desired caloric intake, while maintaining appropriate intake of nutrient and fiber, sodium and fats, and appropriate energy expenditure required for the weight goal.;Weight Management: Provide education and appropriate resources to help participant work on and attain dietary goals.;Weight Management/Obesity: Establish reasonable short term and long term weight goals.;Obesity: Provide education and appropriate resources to help participant work on and attain dietary goals.    Expected Outcomes Short Term: Continue to assess and modify interventions until short term weight is achieved;Long Term: Adherence to nutrition and physical activity/exercise program aimed toward attainment of established weight goal;Weight Loss: Understanding of general recommendations for a balanced deficit meal plan, which promotes 1-2 lb weight loss per week and includes a negative energy balance of [phone] kcal/d;Understanding recommendations for meals to include 15-35% energy as protein, 25-35% energy from fat, 35-60% energy from carbohydrates, less than 200mg  of dietary cholesterol, 20-35 gm of total fiber daily;Understanding of distribution of calorie intake throughout the day with the consumption of 4-5 meals/snacks    Improve shortness of breath with ADL's Yes    Intervention Provide education, individualized exercise plan and daily activity instruction to help decrease symptoms of SOB with activities of daily living.    Expected Outcomes Short Term: Improve cardiorespiratory fitness to achieve a reduction of  symptoms when performing ADLs;Long Term: Be able to perform more ADLs without symptoms or delay the onset of symptoms    Stress Yes    Intervention Offer individual and/or small group education and counseling on adjustment to heart disease, stress management and health-related lifestyle change. Teach and support self-help strategies.;Refer participants experiencing significant psychosocial distress to appropriate mental health specialists for further evaluation and treatment. When possible, include family members and significant others in education/counseling sessions.    Expected Outcomes Short Term: Participant demonstrates changes in health-related behavior, relaxation  and other stress management skills, ability to obtain effective social support, and compliance with psychotropic medications if prescribed.;Long Term: Emotional wellbeing is indicated by absence of clinically significant psychosocial distress or social isolation.             Core Components/Risk Factors/Patient Goals Review:   Goals and Risk Factor Review     Row Name 10/28/22 1523 11/23/22 1137           Core Components/Risk Factors/Patient Goals Review   Personal Goals Review Weight Management/Obesity;Improve shortness of breath with ADL's;Develop more efficient breathing techniques such as purse lipped breathing and diaphragmatic breathing and practicing self-pacing with activity.;Stress Weight Management/Obesity;Improve shortness of breath with ADL's;Develop more efficient breathing techniques such as purse lipped breathing and diaphragmatic breathing and practicing self-pacing with activity.      Review Unable to assess. Luziana is scheduled to start PR on 11/01/22 Goal progressing for weight loss. Tathiana is working with staff dietitian to achieve her weight loss goals. Goal progressing on improving shortness of breath with ADL's. Goal progressing on developing more efficient breathing techniques such as purse lipped  breathing and diaphragmatic breathing; and practicing self-pacing with activity. increase knowledge or respiratory medications and ability to use respiratory devices properly. Kortnee is requiring 2L of O2 to maintain sats >88%. She has also been able to transition from walking the track to walking on the treadmill. We will continue to monitor Lenzi's progress throughout the program.      Expected Outcomes For Rexanne to lose weight, improve her SOB with ADLs, decrease stress, and develop more efficient breathing techniques such as purse lipped breathing and diaphragmatic breathing; and practicing self-pacing with activity For Yury to lose weight, improve her SOB with ADLs, decrease stress, and develop more efficient breathing techniques such as purse lipped breathing and diaphragmatic breathing; and practicing self-pacing with activity               Core Components/Risk Factors/Patient Goals at Discharge (Final Review):   Goals and Risk Factor Review - 11/23/22 1137       Core Components/Risk Factors/Patient Goals Review   Personal Goals Review Weight Management/Obesity;Improve shortness of breath with ADL's;Develop more efficient breathing techniques such as purse lipped breathing and diaphragmatic breathing and practicing self-pacing with activity.    Review Goal progressing for weight loss. Wakeelah is working with staff dietitian to achieve her weight loss goals. Goal progressing on improving shortness of breath with ADL's. Goal progressing on developing more efficient breathing techniques such as purse lipped breathing and diaphragmatic breathing; and practicing self-pacing with activity. increase knowledge or respiratory medications and ability to use respiratory devices properly. Renesha is requiring 2L of O2 to maintain sats >88%. She has also been able to transition from walking the track to walking on the treadmill. We will continue to monitor Malachi's progress throughout the program.     Expected Outcomes For Eulene to lose weight, improve her SOB with ADLs, decrease stress, and develop more efficient breathing techniques such as purse lipped breathing and diaphragmatic breathing; and practicing self-pacing with activity             ITP Comments: Pt is making expected progress toward Pulmonary Rehab goals after completing 7 session(s). Recommend continued exercise, life style modification, education, and utilization of breathing techniques to increase stamina and strength, while also decreasing shortness of breath with exertion.  Dr. Mechele Collin is Medical Director for Pulmonary Rehab at [facility].

## 2022-12-03 DIAGNOSIS — R0602 Shortness of breath: Secondary | ICD-10-CM | POA: Diagnosis not present

## 2022-12-03 DIAGNOSIS — J449 Chronic obstructive pulmonary disease, unspecified: Secondary | ICD-10-CM | POA: Diagnosis not present

## 2022-12-03 DIAGNOSIS — J432 Centrilobular emphysema: Secondary | ICD-10-CM | POA: Diagnosis not present

## 2022-12-06 ENCOUNTER — Encounter (HOSPITAL_COMMUNITY): Payer: 59

## 2022-12-08 ENCOUNTER — Encounter (HOSPITAL_COMMUNITY)
Admission: RE | Admit: 2022-12-08 | Discharge: 2022-12-08 | Disposition: A | Discharge status: Home / Self Care | Payer: 59 | Source: Ambulatory Visit | Attending: Internal Medicine | Admitting: Internal Medicine

## 2022-12-08 VITALS — Wt 177.5 lb

## 2022-12-08 DIAGNOSIS — Z5189 Encounter for other specified aftercare: Secondary | ICD-10-CM | POA: Diagnosis not present

## 2022-12-08 DIAGNOSIS — J449 Chronic obstructive pulmonary disease, unspecified: Secondary | ICD-10-CM | POA: Insufficient documentation

## 2022-12-08 NOTE — Progress Notes (Signed)
Daily Session Note  Patient Details  Name: Jill Glenn MRN: [id number] Date of Birth: [birth year] Referring Provider:   Doristine Devoid Pulmonary Rehab Walk Test from 10/26/2022 in [facility] for Heart, Vascular, & Lung Health  Referring Provider [name]       Encounter Date: 12/08/2022  Check In:  Session Check In - 12/08/22 1027       Check-In   Supervising physician immediately available to respond to emergencies [facility] MD immediately available    Physician(s) Edd Fabian, NP    Location [facility]    Staff Present Raford Pitcher, MS, ACSM-CEP, Exercise Physiologist;Mary Gerre Scull, RN, BSN;Casey Katrinka Blazing, RT;Sohil Timko BS, ACSM-CEP, Exercise Physiologist    Virtual Visit No    Medication changes reported     No    Fall or balance concerns reported    No    Tobacco Cessation No Change    Warm-up and Cool-down Performed as group-led instruction    Resistance Training Performed Yes    VAD Patient? No    PAD/SET Patient? No      Pain Assessment   Currently in Pain? No/denies    Multiple Pain Sites No             Capillary Blood Glucose: No results found for this or any previous visit (from the past 24 hour(s)).    Social History   Tobacco Use  Smoking Status Former   Current packs/day: 0.00   Average packs/day: 1 pack/day for 40.6 years (40.6 ttl pk-yrs)   Types: Cigarettes   Start date: 03/05/1981   Quit date: 10/14/2021   Years since quitting: 1.1  Smokeless Tobacco Never    Goals Met:  Independence with exercise equipment Exercise tolerated well No report of concerns or symptoms today Strength training completed today  Goals Unmet:  Not Applicable  Comments: Service time is from 1009 to 1143.    Dr. Mechele Collin is Medical Director for Pulmonary Rehab at [facility]

## 2022-12-13 ENCOUNTER — Encounter (HOSPITAL_COMMUNITY): Payer: 59

## 2022-12-13 ENCOUNTER — Telehealth (HOSPITAL_COMMUNITY): Payer: Self-pay

## 2022-12-13 NOTE — Telephone Encounter (Signed)
PT called and has canceled her appointment for today she did not give a reason

## 2022-12-15 ENCOUNTER — Encounter (HOSPITAL_COMMUNITY)
Admission: RE | Admit: 2022-12-15 | Discharge: 2022-12-15 | Disposition: A | Discharge status: Home / Self Care | Payer: 59 | Source: Ambulatory Visit | Attending: Internal Medicine | Admitting: Internal Medicine

## 2022-12-15 DIAGNOSIS — J449 Chronic obstructive pulmonary disease, unspecified: Secondary | ICD-10-CM | POA: Diagnosis not present

## 2022-12-15 DIAGNOSIS — Z5189 Encounter for other specified aftercare: Secondary | ICD-10-CM | POA: Diagnosis not present

## 2022-12-15 NOTE — Progress Notes (Signed)
Daily Session Note  Patient Details  Name: Jill Glenn MRN: [id number] Date of Birth: 1965/04/10 Referring Provider:   Doristine Devoid Pulmonary Rehab Walk Test from 10/26/2022 in [facility] for Heart, Vascular, & Lung Health  Referring Provider [name]       Encounter Date: 12/15/2022  Check In:  Session Check In - 12/15/22 1025       Check-In   Supervising physician immediately available to respond to emergencies [facility] MD immediately available    Physician(s) Joni Reining, NP    Location [facility]    Staff Present Raford Pitcher, MS, ACSM-CEP, Exercise Physiologist;Mary Gerre Scull, RN, BSN;Casey Katrinka Blazing, RT;Rakel Junio BS, ACSM-CEP, Exercise Physiologist    Virtual Visit No    Medication changes reported     No    Fall or balance concerns reported    No    Tobacco Cessation No Change    Warm-up and Cool-down Performed as group-led instruction    Resistance Training Performed Yes    VAD Patient? No    PAD/SET Patient? No      Pain Assessment   Currently in Pain? No/denies    Multiple Pain Sites No             Capillary Blood Glucose: No results found for this or any previous visit (from the past 24 hours).    Social History   Tobacco Use  Smoking Status Former   Current packs/day: 0.00   Average packs/day: 1 pack/day for 40.6 years (40.6 ttl pk-yrs)   Types: Cigarettes   Start date: 03/05/1981   Quit date: 10/14/2021   Years since quitting: 1.1  Smokeless Tobacco Never    Goals Met:  Independence with exercise equipment Exercise tolerated well No report of concerns or symptoms today Strength training completed today  Goals Unmet:  Not Applicable  Comments: Service time is from 1006 to 1140.    Dr. Mechele Collin is Medical Director for Pulmonary Rehab at [facility].

## 2022-12-16 ENCOUNTER — Ambulatory Visit
Admission: RE | Admit: 2022-12-16 | Discharge: 2022-12-16 | Disposition: A | Discharge status: Home / Self Care | Payer: 59 | Source: Ambulatory Visit | Attending: Internal Medicine | Admitting: Internal Medicine

## 2022-12-16 DIAGNOSIS — Z1231 Encounter for screening mammogram for malignant neoplasm of breast: Secondary | ICD-10-CM

## 2022-12-19 ENCOUNTER — Ambulatory Visit (HOSPITAL_COMMUNITY): Payer: 59 | Admitting: Student

## 2022-12-19 DIAGNOSIS — J449 Chronic obstructive pulmonary disease, unspecified: Secondary | ICD-10-CM | POA: Diagnosis not present

## 2022-12-19 DIAGNOSIS — F17201 Nicotine dependence, unspecified, in remission: Secondary | ICD-10-CM | POA: Diagnosis not present

## 2022-12-20 ENCOUNTER — Encounter (HOSPITAL_COMMUNITY)
Admission: RE | Admit: 2022-12-20 | Discharge: 2022-12-20 | Disposition: A | Discharge status: Home / Self Care | Payer: 59 | Source: Ambulatory Visit | Attending: Internal Medicine | Admitting: Internal Medicine

## 2022-12-20 DIAGNOSIS — J449 Chronic obstructive pulmonary disease, unspecified: Secondary | ICD-10-CM

## 2022-12-20 DIAGNOSIS — Z5189 Encounter for other specified aftercare: Secondary | ICD-10-CM | POA: Diagnosis not present

## 2022-12-20 NOTE — Progress Notes (Signed)
Daily Session Note  Patient Details  Name: Jill Glenn MRN: [id number] Date of Birth: Jan 22, 1965 Referring Provider:   Doristine Devoid Pulmonary Rehab Walk Test from 10/26/2022 in [facility] for Heart, Vascular, & Lung Health  Referring Provider [name]       Encounter Date: 12/20/2022  Check In:  Session Check In - 12/20/22 1115       Check-In   Supervising physician immediately available to respond to emergencies [facility] MD immediately available    Physician(s) Bernadene Person, NP    [facility]    Staff Present Raford Pitcher, MS, ACSM-CEP, Exercise Physiologist;Mary Gerre Scull, RN, BSN;Creston Klas Katrinka Blazing, RT;Randi Reeve BS, ACSM-CEP, Exercise Physiologist    Virtual Visit No    Medication changes reported     No    Fall or balance concerns reported    No    Tobacco Cessation No Change    Warm-up and Cool-down Performed as group-led instruction    Resistance Training Performed Yes    VAD Patient? No    PAD/SET Patient? No      Pain Assessment   Currently in Pain? No/denies    Multiple Pain Sites No             Capillary Blood Glucose: No results found for this or any previous visit (from the past 24 hours).    Social History   Tobacco Use  Smoking Status Former   Current packs/day: 0.00   Average packs/day: 1 pack/day for 40.6 years (40.6 ttl pk-yrs)   Types: Cigarettes   Start date: 03/05/1981   Quit date: 10/14/2021   Years since quitting: 1.1  Smokeless Tobacco Never    Goals Met:  Proper associated with RPD/PD & O2 Sat Independence with exercise equipment Exercise tolerated well No report of concerns or symptoms today Strength training completed today  Goals Unmet:  Not Applicable  Comments: Service time is from 1009 to 1140.    Dr. Mechele Collin is Medical Director for Pulmonary Rehab at [facility].

## 2022-12-22 ENCOUNTER — Encounter (HOSPITAL_COMMUNITY)
Admission: RE | Admit: 2022-12-22 | Discharge: 2022-12-22 | Disposition: A | Discharge status: Home / Self Care | Payer: 59 | Source: Ambulatory Visit | Attending: Internal Medicine | Admitting: Internal Medicine

## 2022-12-22 VITALS — Wt 179.0 lb

## 2022-12-22 DIAGNOSIS — Z5189 Encounter for other specified aftercare: Secondary | ICD-10-CM | POA: Diagnosis not present

## 2022-12-22 DIAGNOSIS — J449 Chronic obstructive pulmonary disease, unspecified: Secondary | ICD-10-CM

## 2022-12-22 NOTE — Progress Notes (Signed)
Daily Session Note  Patient Details  Name: Jill Glenn MRN: [id number] Date of Birth: 1965-05-04 Referring Provider:   Doristine Devoid Pulmonary Rehab Walk Test from 10/26/2022 in [facility] for Heart, Vascular, & Lung Health  Referring Provider [name]       Encounter Date: 12/22/2022  Check In:  Session Check In - 12/22/22 1052       Check-In   Supervising physician immediately available to respond to emergencies [facility] MD immediately available    Physician(s) Robin Searing, NP    [facility]    Staff Present Raford Pitcher, MS, ACSM-CEP, Exercise Physiologist;Mary Gerre Scull, RN, BSN;Rorey Hodges Katrinka Blazing, RT;Randi Reeve BS, ACSM-CEP, Exercise Physiologist    Virtual Visit No    Medication changes reported     No    Fall or balance concerns reported    No    Tobacco Cessation No Change    Warm-up and Cool-down Performed as group-led instruction    Resistance Training Performed Yes    VAD Patient? No    PAD/SET Patient? No      Pain Assessment   Currently in Pain? No/denies             Capillary Blood Glucose: No results found for this or any previous visit (from the past 24 hours).    Social History   Tobacco Use  Smoking Status Former   Current packs/day: 0.00   Average packs/day: 1 pack/day for 40.6 years (40.6 ttl pk-yrs)   Types: Cigarettes   Start date: 03/05/1981   Quit date: 10/14/2021   Years since quitting: 1.1  Smokeless Tobacco Never    Goals Met:  Proper associated with RPD/PD & O2 Sat Independence with exercise equipment Exercise tolerated well No report of concerns or symptoms today Strength training completed today  Goals Unmet:  Not Applicable  Comments: Service time is from 1010 to 1141.    Dr. Mechele Collin is Medical Director for Pulmonary Rehab at [facility]

## 2022-12-23 ENCOUNTER — Ambulatory Visit
Admission: RE | Admit: 2022-12-23 | Discharge: 2022-12-23 | Disposition: A | Discharge status: Home / Self Care | Payer: 59 | Source: Ambulatory Visit | Attending: Internal Medicine | Admitting: Internal Medicine

## 2022-12-23 DIAGNOSIS — Z87891 Personal history of nicotine dependence: Secondary | ICD-10-CM | POA: Diagnosis not present

## 2022-12-23 DIAGNOSIS — J449 Chronic obstructive pulmonary disease, unspecified: Secondary | ICD-10-CM

## 2022-12-23 DIAGNOSIS — K449 Diaphragmatic hernia without obstruction or gangrene: Secondary | ICD-10-CM | POA: Diagnosis not present

## 2022-12-23 DIAGNOSIS — R918 Other nonspecific abnormal finding of lung field: Secondary | ICD-10-CM | POA: Diagnosis not present

## 2022-12-23 DIAGNOSIS — R9389 Abnormal findings on diagnostic imaging of other specified body structures: Secondary | ICD-10-CM

## 2022-12-23 DIAGNOSIS — J439 Emphysema, unspecified: Secondary | ICD-10-CM | POA: Diagnosis not present

## 2022-12-26 ENCOUNTER — Encounter: Payer: Self-pay | Admitting: Adult Health

## 2022-12-26 ENCOUNTER — Ambulatory Visit: Payer: 59 | Admitting: Adult Health

## 2022-12-26 VITALS — BP 117/78 | HR 76 | Ht 63.0 in | Wt 181.0 lb

## 2022-12-26 DIAGNOSIS — G43009 Migraine without aura, not intractable, without status migrainosus: Secondary | ICD-10-CM | POA: Diagnosis not present
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
## 2022-12-26 NOTE — Patient Instructions (Signed)
Your Plan:  Lynelle Doctor for prevention      Thank you for coming to see [place] at [facility]. I hope we have been able to provide you high quality care today.  You may receive a patient satisfaction survey over the next few weeks. We would appreciate your feedback and comments so that we may continue to improve ourselves and the health of our patients.

## 2022-12-27 ENCOUNTER — Encounter (HOSPITAL_COMMUNITY): Payer: 59

## 2022-12-27 NOTE — Progress Notes (Signed)
Pulmonary Individual Treatment Plan  Patient Details  Name: Jill Glenn MRN: [id number] Date of Birth: 1965/01/31 Referring Provider:   Doristine Devoid Pulmonary Rehab Walk Test from 10/26/2022 in [facility]  Referring Provider [name]       Initial Encounter Date:  Flowsheet Row Pulmonary Rehab Walk Test from 10/26/2022 in [facility]  Date 10/26/22       Visit Diagnosis: Stage 3 severe COPD by GOLD classification (HCC)  Patient's Home Medications on Admission:   Current Outpatient Medications:    acetaminophen (TYLENOL) 325 MG tablet, Take 2 tablets (650 mg total) by mouth every 6 (six) hours as needed for moderate pain, mild pain or headache., Disp: , Rfl:    albuterol (PROVENTIL) (2.5 MG/3ML) 0.083% nebulizer solution, Take 3 mLs (2.5 mg total) by nebulization every 6 (six) hours as needed for wheezing or shortness of breath., Disp: 75 mL, Rfl: 12   albuterol (VENTOLIN HFA) 108 (90 Base) MCG/ACT inhaler, Inhale 2 puffs into the lungs every 6 (six) hours as needed for wheezing or shortness of breath., Disp: 18 g, Rfl: 3   Ascorbic Acid (VITAMIN C GUMMIES PO), Take 500 mg by mouth daily., Disp: , Rfl:    budesonide (PULMICORT) 0.5 MG/2ML nebulizer solution, Take 2 mLs (0.5 mg total) by nebulization in the morning and at bedtime., Disp: 60 mL, Rfl: 11   busPIRone (BUSPAR) 7.5 MG tablet, Take 1 tablet (7.5 mg total) by mouth 2 (two) times daily., Disp: 60 tablet, Rfl: 1   Glycopyrrolate-Formoterol (BEVESPI AEROSPHERE) 9-4.8 MCG/ACT AERO, Inhale 2 puffs into the lungs 2 (two) times daily., Disp: 1 each, Rfl: 11   hydrOXYzine (ATARAX) 25 MG tablet, Take 0.5-1 tablets (12.5-25 mg total) by mouth at bedtime as needed (sleep)., Disp: 30 tablet, Rfl: 1   levocetirizine (XYZAL) 5 MG tablet, Take 1 tablet (5 mg total) by mouth every evening., Disp: 30 tablet, Rfl: 11    montelukast (SINGULAIR) 10 MG tablet, Take 1 tablet (10 mg total) by mouth at bedtime., Disp: 30 tablet, Rfl: 11   Multiple Vitamin (MULTIVITAMIN ADULT) TABS, Take 1 tablet by mouth daily with breakfast., Disp: , Rfl:    rizatriptan (MAXALT) 10 MG tablet, Take 1 tablet (10 mg total) by mouth once as needed for migraine. May repeat in 2 hours if needed, Disp: 30 tablet, Rfl: 2   TRELEGY ELLIPTA 200-62.5-25 MCG/ACT AEPB, daily., Disp: , Rfl:    Ubrogepant (UBRELVY) 100 MG TABS, Take one tablet onset of Migraine May take another tablet 2 hours later, Don't exceed two tablets daily, Disp: 16 tablet, Rfl: 11   varenicline (CHANTIX) 1 MG tablet, Take 1 mg by mouth daily., Disp: , Rfl:   Past Medical History: Past Medical History:  Diagnosis Date   Allergy    seasonal   Anxiety    Asthma    COPD (chronic obstructive pulmonary disease) (HCC)    Depression    Emphysema of lung (HCC)    PTSD (post-traumatic stress disorder)     Tobacco Use: Social History   Tobacco Use  Smoking Status Former   Current packs/day: 0.00   Average packs/day: 1 pack/day for 40.6 years (40.6 ttl pk-yrs)   Types: Cigarettes   Start date: 03/05/1981   Quit date: 10/14/2021   Years since quitting: 1.2  Smokeless Tobacco Never    Labs: Review Flowsheet  More data may exist  Latest Ref Rng & Units 11/02/2020 12/21/2020 03/24/2022 04/12/2022 11/16/2022  Labs for ITP Cardiac and Pulmonary Rehab  Cholestrol 0 - 200 mg/dL - 841  - 324  -  LDL (calc) 0 - 99 mg/dL - 91  - 401  -  HDL-C >02.72 mg/dL - 53.66  - 44.03  -  Trlycerides 0.0 - 149.0 mg/dL - 47.4  - 259.5  -  Hemoglobin A1c 4.6 - 6.5 % - - 6.0  - 5.9   Bicarbonate 20.0 - 28.0 mmol/L 22.2  - - - -  TCO2 22 - 32 mmol/L 23  - - - -  Acid-base deficit 0.0 - 2.0 mmol/L 3.0  - - - -  O2 Saturation % 90.0  - - - -    Capillary Blood Glucose: Lab Results  Component Value Date   GLUCAP 73 03/26/2022   GLUCAP 148 (H) 03/25/2022   GLUCAP 166 (H) 03/25/2022    GLUCAP 88 03/25/2022   GLUCAP 84 03/25/2022     Pulmonary Assessment Scores:  Pulmonary Assessment Scores     Row Name 10/26/22 1135         ADL UCSD   ADL Phase Entry     SOB Score total 54       CAT Score   CAT Score 16       mMRC Score   mMRC Score 4             UCSD: Self-administered rating of dyspnea associated with activities of daily living (ADLs) 6-point scale (0 = "not at all" to 5 = "maximal or unable to do because of breathlessness")  Scoring Scores range from 0 to 120.  Minimally important difference is 5 units  CAT: CAT can identify the health impairment of COPD patients and is better correlated with disease progression.  CAT has a scoring range of zero to 40. The CAT score is classified into four groups of low (less than 10), medium (10 - 20), high (21-30) and very high (31-40) based on the impact level of disease on health status. A CAT score over 10 suggests significant symptoms.  A worsening CAT score could be explained by an exacerbation, poor medication adherence, poor inhaler technique, or progression of COPD or comorbid conditions.  CAT MCID is 2 points  mMRC: mMRC (Modified Medical Research Council) Dyspnea Scale is used to assess the degree of baseline functional disability in patients of respiratory disease due to dyspnea. No minimal important difference is established. A decrease in score of 1 point or greater is considered a positive change.   Pulmonary Function Assessment:  Pulmonary Function Assessment - 10/26/22 1109       Breath   Bilateral Breath Sounds Clear;Decreased    Shortness of Breath Limiting activity;Panic with Shortness of Breath   only panics if she doesn't have her inhaler with her            Exercise Target Goals: Exercise Program Goal: Individual exercise prescription set using results from initial 6 min walk test and THRR while considering  patient's activity barriers and safety.   Exercise Prescription  Goal: Initial exercise prescription builds to 30-45 minutes a day of aerobic activity, 2-3 days per week.  Home exercise guidelines will be given to patient during program as part of exercise prescription that the participant will acknowledge.  Activity Barriers & Risk Stratification:  Activity Barriers & Cardiac Risk Stratification - 10/26/22 1107       Activity Barriers & Cardiac Risk Stratification  Activity Barriers Deconditioning;Muscular Weakness;Shortness of Breath;Arthritis;Back Problems    Cardiac Risk Stratification Moderate             6 Minute Walk:  6 Minute Walk     Row Name 10/26/22 1201         6 Minute Walk   Phase Initial     Distance 1125 feet     Walk Time 6 minutes     # of Rest Breaks 1  2:12-3:00     MPH 2.13     METS 2.98     RPE 14.5     Perceived Dyspnea  3     VO2 Peak 10.43     Symptoms No     Resting HR 74 bpm     Resting BP 106/70     Resting Oxygen Saturation  95 %     Exercise Oxygen Saturation  during 6 min walk 84 %     Max Ex. HR 107 bpm     Max Ex. BP 110/70     2 Minute Post BP 106/60       Interval HR   1 Minute HR 83     2 Minute HR 85     3 Minute HR 90     4 Minute HR 89     5 Minute HR 107     6 Minute HR 98     2 Minute Post HR 76     Interval Heart Rate? Yes       Interval Oxygen   Interval Oxygen? Yes     Baseline Oxygen Saturation % 95 %     1 Minute Oxygen Saturation % 97 %     1 Minute Liters of Oxygen 0 L     2 Minute Oxygen Saturation % 89 %     2 Minute Liters of Oxygen 0 L     3 Minute Oxygen Saturation % 94 %  2:12 84%     3 Minute Liters of Oxygen 0 L  increased to 2L     4 Minute Oxygen Saturation % 92 %     4 Minute Liters of Oxygen 2 L     5 Minute Oxygen Saturation % 92 %     5 Minute Liters of Oxygen 2 L     6 Minute Oxygen Saturation % 89 %     6 Minute Liters of Oxygen 2 L     2 Minute Post Oxygen Saturation % 97 %     2 Minute Post Liters of Oxygen 2 L              Oxygen  Initial Assessment:  Oxygen Initial Assessment - 10/26/22 1108       Home Oxygen   Home Oxygen Device Home Concentrator;Portable Concentrator;E-Tanks    Sleep Oxygen Prescription Continuous    Liters per minute 2    Home Exercise Oxygen Prescription Pulsed    Liters per minute 2    Home Resting Oxygen Prescription None    Compliance with Home Oxygen Use Yes      Initial 6 min Walk   Oxygen Used Continuous    Liters per minute 2      Program Oxygen Prescription   Program Oxygen Prescription Continuous    Liters per minute 2      Intervention   Short Term Goals To learn and exhibit compliance with exercise, home and travel O2 prescription;To learn and understand importance of monitoring  SPO2 with pulse oximeter and demonstrate accurate use of the pulse oximeter.;To learn and understand importance of maintaining oxygen saturations>88%;To learn and demonstrate proper pursed lip breathing techniques or other breathing techniques. ;To learn and demonstrate proper use of respiratory medications    Long  Term Goals Exhibits compliance with exercise, home  and travel O2 prescription;Maintenance of O2 saturations>88%;Compliance with respiratory medication;Verbalizes importance of monitoring SPO2 with pulse oximeter and return demonstration;Exhibits proper breathing techniques, such as pursed lip breathing or other method taught during program session;Demonstrates proper use of MDI's             Oxygen Re-Evaluation:  Oxygen Re-Evaluation     Row Name 10/31/22 0848 11/23/22 1202 12/19/22 0909         Program Oxygen Prescription   Program Oxygen Prescription Continuous Continuous Continuous     Liters per minute 2 2 2        Home Oxygen   Home Oxygen Device Home Concentrator;Portable Concentrator;E-Tanks Home Concentrator;Portable Concentrator;E-Tanks Home Concentrator;Portable Concentrator;E-Tanks     Sleep Oxygen Prescription Continuous Continuous Continuous     Liters per minute 2  2 2      Home Exercise Oxygen Prescription Pulsed Pulsed Pulsed     Liters per minute 2 2 2      Home Resting Oxygen Prescription None None None     Compliance with Home Oxygen Use Yes Yes Yes       Goals/Expected Outcomes   Short Term Goals To learn and exhibit compliance with exercise, home and travel O2 prescription;To learn and understand importance of monitoring SPO2 with pulse oximeter and demonstrate accurate use of the pulse oximeter.;To learn and understand importance of maintaining oxygen saturations>88%;To learn and demonstrate proper pursed lip breathing techniques or other breathing techniques. ;To learn and demonstrate proper use of respiratory medications To learn and exhibit compliance with exercise, home and travel O2 prescription;To learn and understand importance of monitoring SPO2 with pulse oximeter and demonstrate accurate use of the pulse oximeter.;To learn and understand importance of maintaining oxygen saturations>88%;To learn and demonstrate proper pursed lip breathing techniques or other breathing techniques. ;To learn and demonstrate proper use of respiratory medications To learn and exhibit compliance with exercise, home and travel O2 prescription;To learn and understand importance of monitoring SPO2 with pulse oximeter and demonstrate accurate use of the pulse oximeter.;To learn and understand importance of maintaining oxygen saturations>88%;To learn and demonstrate proper pursed lip breathing techniques or other breathing techniques. ;To learn and demonstrate proper use of respiratory medications     Long  Term Goals Exhibits compliance with exercise, home  and travel O2 prescription;Maintenance of O2 saturations>88%;Compliance with respiratory medication;Verbalizes importance of monitoring SPO2 with pulse oximeter and return demonstration;Exhibits proper breathing techniques, such as pursed lip breathing or other method taught during program session;Demonstrates proper use of  MDI's Exhibits compliance with exercise, home  and travel O2 prescription;Maintenance of O2 saturations>88%;Compliance with respiratory medication;Verbalizes importance of monitoring SPO2 with pulse oximeter and return demonstration;Exhibits proper breathing techniques, such as pursed lip breathing or other method taught during program session;Demonstrates proper use of MDI's Exhibits compliance with exercise, home  and travel O2 prescription;Maintenance of O2 saturations>88%;Compliance with respiratory medication;Verbalizes importance of monitoring SPO2 with pulse oximeter and return demonstration;Exhibits proper breathing techniques, such as pursed lip breathing or other method taught during program session;Demonstrates proper use of MDI's     Goals/Expected Outcomes Compliance and understanding of oxygen saturations monitoring and breathing techniques to decrease shortness of breath. Compliance and understanding of oxygen saturations monitoring  and breathing techniques to decrease shortness of breath. Compliance and understanding of oxygen saturations monitoring and breathing techniques to decrease shortness of breath.              Oxygen Discharge (Final Oxygen Re-Evaluation):  Oxygen Re-Evaluation - 12/19/22 0909       Program Oxygen Prescription   Program Oxygen Prescription Continuous    Liters per minute 2      Home Oxygen   Home Oxygen Device Home Concentrator;Portable Concentrator;E-Tanks    Sleep Oxygen Prescription Continuous    Liters per minute 2    Home Exercise Oxygen Prescription Pulsed    Liters per minute 2    Home Resting Oxygen Prescription None    Compliance with Home Oxygen Use Yes      Goals/Expected Outcomes   Short Term Goals To learn and exhibit compliance with exercise, home and travel O2 prescription;To learn and understand importance of monitoring SPO2 with pulse oximeter and demonstrate accurate use of the pulse oximeter.;To learn and understand importance of  maintaining oxygen saturations>88%;To learn and demonstrate proper pursed lip breathing techniques or other breathing techniques. ;To learn and demonstrate proper use of respiratory medications    Long  Term Goals Exhibits compliance with exercise, home  and travel O2 prescription;Maintenance of O2 saturations>88%;Compliance with respiratory medication;Verbalizes importance of monitoring SPO2 with pulse oximeter and return demonstration;Exhibits proper breathing techniques, such as pursed lip breathing or other method taught during program session;Demonstrates proper use of MDI's    Goals/Expected Outcomes Compliance and understanding of oxygen saturations monitoring and breathing techniques to decrease shortness of breath.             Initial Exercise Prescription:  Initial Exercise Prescription - 10/26/22 1200       Date of Initial Exercise RX and Referring Provider   Date 10/26/22    Referring Provider [name]    Expected Discharge Date 01/19/23      Oxygen   Oxygen Continuous    Liters 2    Maintain Oxygen Saturation 88% or higher      Treadmill   MPH 1.7    Grade 0    Minutes 15      Recumbant Elliptical   Level 1    Minutes 15    METs 1.5      Prescription Details   Frequency (times per week) 2    Duration Progress to 30 minutes of continuous aerobic without signs/symptoms of physical distress      Intensity   THRR 40-80% of Max Heartrate 66-131    Ratings of Perceived Exertion 11-13    Perceived Dyspnea 0-4      Progression   Progression Continue progressive overload as per policy without signs/symptoms or physical distress.      Resistance Training   Training Prescription Yes    Weight blue bands    Reps 10-15             Perform Capillary Blood Glucose checks as needed.  Exercise Prescription Changes:   Exercise Prescription Changes     Row Name 11/01/22 1200 11/15/22 1100 11/24/22 0941 12/08/22 1152 12/22/22 1158     Response to Exercise    Blood Pressure (Admit) 110/77 104/70 117/75 104/60 128/96   Blood Pressure (Exercise) 109/83 112/70 -- -- --   Blood Pressure (Exit) 97/67 104/68 96/58 100/64 108/62   Heart Rate (Admit) 77 bpm 80 bpm 81 bpm 77 bpm 72 bpm   Heart Rate (Exercise) 104 bpm 98 bpm 97  bpm 92 bpm 91 bpm   Heart Rate (Exit) 83 bpm 91 bpm 91 bpm 81 bpm 79 bpm   Oxygen Saturation (Admit) 99 % 97 % 95 % 98 % 97 %   Oxygen Saturation (Exercise) 96 % 93 % 91 % 95 % 98 %   Oxygen Saturation (Exit) 97 % 97 % 97 % 96 % 97 %   Rating of Perceived Exertion (Exercise) 13 13 12.5 12 13    Perceived Dyspnea (Exercise) 2 2 3 2 2    Duration Continue with 30 min of aerobic exercise without signs/symptoms of physical distress. Continue with 30 min of aerobic exercise without signs/symptoms of physical distress. Continue with 30 min of aerobic exercise without signs/symptoms of physical distress. Continue with 30 min of aerobic exercise without signs/symptoms of physical distress. Continue with 30 min of aerobic exercise without signs/symptoms of physical distress.   Intensity THRR unchanged THRR unchanged THRR unchanged THRR unchanged THRR unchanged     Progression   Progression Continue to progress workloads to maintain intensity without signs/symptoms of physical distress. Continue to progress workloads to maintain intensity without signs/symptoms of physical distress. Continue to progress workloads to maintain intensity without signs/symptoms of physical distress. Continue to progress workloads to maintain intensity without signs/symptoms of physical distress. Continue to progress workloads to maintain intensity without signs/symptoms of physical distress.     Resistance Training   Training Prescription Yes Yes Yes Yes Yes   Weight blue bands blue bands blue bands blue bands blue bands   Reps 10-15 10-15 10-15 10-15 10-15   Time 10 Minutes 10 Minutes 10 Minutes 10 Minutes 10 Minutes     Interval Training   Interval Training -- --  -- Yes Yes   Equipment -- -- -- Treadmill Treadmill   Comments -- -- -- 66min@3mph  and 2% incline, @2mph  and 0% incline 85min@2mph &0%incline, 95min@mph &2%incline     Oxygen   Oxygen Continuous Continuous Continuous Continuous Continuous   Liters 2 2 2 2 2      Treadmill   MPH 1 1.1 2.1 3 --   Grade 0 0 1 2 --   Minutes 15 15 15 15  --   METs 1.6 2.1 2.2 4.1 --     Recumbant Elliptical   Level 1 1 2 2 3    Minutes 15 15 15 15 15    METs 2.1 2.6 2.6 3 2.4     Oxygen   Maintain Oxygen Saturation 88% or higher 88% or higher 88% or higher 88% or higher 88% or higher            Exercise Comments:   Exercise Comments     Row Name 11/01/22 1208           Exercise Comments Pt completed 1st day of exercise. She exercised for 15 min on the treadmill and recumbent elliptical. She averaged 1.6 METs at 1 mph and 0 incline on the treadmill and 2.1 METs at level 1 on the recumbent elliptical. Sheretta performed the warmup and cooldown standing without limitations. Britthany did not tolerate the treadmill well and will be transfered to the track. Pt understands METs from previous participating.                Exercise Goals and Review:   Exercise Goals     Row Name 10/26/22 1107             Exercise Goals   Increase Physical Activity Yes       Intervention Provide advice,  education, support and counseling about physical activity/exercise needs.;Develop an individualized exercise prescription for aerobic and resistive training based on initial evaluation findings, risk stratification, comorbidities and participant's personal goals.       Expected Outcomes Short Term: Attend rehab on a regular basis to increase amount of physical activity.;Long Term: Add in home exercise to make exercise part of routine and to increase amount of physical activity.;Long Term: Exercising regularly at least 3-5 days a week.       Increase Strength and Stamina Yes       Intervention Provide advice,  education, support and counseling about physical activity/exercise needs.;Develop an individualized exercise prescription for aerobic and resistive training based on initial evaluation findings, risk stratification, comorbidities and participant's personal goals.       Expected Outcomes Short Term: Increase workloads from initial exercise prescription for resistance, speed, and METs.;Short Term: Perform resistance training exercises routinely during rehab and add in resistance training at home;Long Term: Improve cardiorespiratory fitness, muscular endurance and strength as measured by increased METs and functional capacity ( )       Able to understand and use rate of perceived exertion (RPE) scale Yes       Intervention Provide education and explanation on how to use RPE scale       Expected Outcomes Short Term: Able to use RPE daily in rehab to express subjective intensity level;Long Term:  Able to use RPE to guide intensity level when exercising independently       Able to understand and use Dyspnea scale Yes       Intervention Provide education and explanation on how to use Dyspnea scale       Expected Outcomes Short Term: Able to use Dyspnea scale daily in rehab to express subjective sense of shortness of breath during exertion;Long Term: Able to use Dyspnea scale to guide intensity level when exercising independently       Knowledge and understanding of Target Heart Rate Range (THRR) Yes       Intervention Provide education and explanation of THRR including how the numbers were predicted and where they are located for reference       Expected Outcomes Short Term: Able to state/look up THRR;Long Term: Able to use THRR to govern intensity when exercising independently;Short Term: Able to use daily as guideline for intensity in rehab       Understanding of Exercise Prescription Yes       Intervention Provide education, explanation, and written materials on patient's individual exercise prescription        Expected Outcomes Short Term: Able to explain program exercise prescription;Long Term: Able to explain home exercise prescription to exercise independently                Exercise Goals Re-Evaluation :  Exercise Goals Re-Evaluation     Row Name 10/31/22 0844 11/23/22 1159 12/19/22 0906         Exercise Goal Re-Evaluation   Exercise Goals Review Increase Physical Activity;Able to understand and use Dyspnea scale;Understanding of Exercise Prescription;Increase Strength and Stamina;Knowledge and understanding of Target Heart Rate Range (THRR);Able to understand and use rate of perceived exertion (RPE) scale Increase Physical Activity;Able to understand and use Dyspnea scale;Understanding of Exercise Prescription;Increase Strength and Stamina;Knowledge and understanding of Target Heart Rate Range (THRR);Able to understand and use rate of perceived exertion (RPE) scale Increase Physical Activity;Able to understand and use Dyspnea scale;Understanding of Exercise Prescription;Increase Strength and Stamina;Knowledge and understanding of Target Heart Rate Range (THRR);Able to understand  and use rate of perceived exertion (RPE) scale     Comments Jill Glenn is scheduled to begin Pulmonary Rehab again on 10/29. Will continue to monitor and progress as able. Jill Glenn has completed 6 exercise sessions. She exercises for 15 min on the recumbent elliptical and treadmill. She averages 2.6 METs at level 2 on the recumbent elliptical and 2.1 METs at 1.7 mph on the treadmill. She performs the warmup and cooldown standing without limitations. Jill Glenn has increased her workload for the recumbent elliptical as METs have remained the same. Jill Glenn has also progressed to treadmill walking. She did not tolerate treadmill walking on her first day of exercise but does now. Will continue to monitor and progress as able. Jill Glenn has completed 9 exercise sessions. She exercises for 15 min on the recumbent elliptical and treadmill.  She averages 3 METs at level 2 on the recumbent elliptical and 2.2 METs on the treadmill. She performs the warmup and cooldown standing without limitations. Jill Glenn has progressed to interval training on the treadmill. She walks at 1.8 mph for 2 min and 2 mph and 3% incline for 4 min. Jill Glenn tolerates interval walking so far. Will increased level on recumbent elliptical. Will continue to monitor and progress as able.     Expected Outcomes Through exercise at rehab and home, the patient will decrease shortness of breath with daily activities and feel confident in carrying out an exercise regimen at home. Through exercise at rehab and home, the patient will decrease shortness of breath with daily activities and feel confident in carrying out an exercise regimen at home. Through exercise at rehab and home, the patient will decrease shortness of breath with daily activities and feel confident in carrying out an exercise regimen at home.              Discharge Exercise Prescription (Final Exercise Prescription Changes):  Exercise Prescription Changes - 12/22/22 1158       Response to Exercise   Blood Pressure (Admit) 128/96    Blood Pressure (Exit) 108/62    Heart Rate (Admit) 72 bpm    Heart Rate (Exercise) 91 bpm    Heart Rate (Exit) 79 bpm    Oxygen Saturation (Admit) 97 %    Oxygen Saturation (Exercise) 98 %    Oxygen Saturation (Exit) 97 %    Rating of Perceived Exertion (Exercise) 13    Perceived Dyspnea (Exercise) 2    Duration Continue with 30 min of aerobic exercise without signs/symptoms of physical distress.    Intensity THRR unchanged      Progression   Progression Continue to progress workloads to maintain intensity without signs/symptoms of physical distress.      Resistance Training   Training Prescription Yes    Weight blue bands    Reps 10-15    Time 10 Minutes      Interval Training   Interval Training Yes    Equipment Treadmill    Comments 28min@2mph &0%incline,  45min@mph &2%incline      Oxygen   Oxygen Continuous    Liters 2      Recumbant Elliptical   Level 3    Minutes 15    METs 2.4      Oxygen   Maintain Oxygen Saturation 88% or higher             Nutrition:  Target Goals: Understanding of nutrition guidelines, daily intake of sodium 1500mg , cholesterol 200mg , calories 30% from fat and 7% or less from saturated fats, daily to have 5  or more servings of fruits and vegetables.  Biometrics:    Nutrition Therapy Plan and Nutrition Goals:  Nutrition Therapy & Goals - 11/29/22 1532       Nutrition Therapy   Diet General Healthy Diet      Personal Nutrition Goals   Nutrition Goal Patient to improve dietary quality by using the plate method as a daily guide for meal planning to include lean protein/plant protein, fruits, vegetables, whole grains, and nonfat/low fat dairy as part of well balanced diet   goal in progress.   Personal Goal #2 Patient to identify strategies for weight loss of 0.5-2.0# per week of weight loss.   goal not met.   Comments Goals in progress. Niaomi has medical history of pulmonary HTN, COPD3, chronic respiratory failure. Per [facility] transplant RD documentation on 10/05/22, it is recommended that she lose weight to BMI 30/170# and ultimately BMI 27/150#. She does report history of over snacking on refined carbohydates, snacking in the middle of the night, etc. She has previously completed pulmonary rehab in February 2024; her starting weight at that time was 69.8kg/153.6#. She reports using MyFitness Pal app to aid with tracking calories with goal set at ~1200kcals per patient. She has maintained her weight since starting with our program. She does acknowledge some emotional eating/binge type eating tendencies; she will begin seeing a behavioral health counselor.  Jill Glenn would continue to benefit from weight loss and decrease intake of refined carbohydrates to support pulmonary disease.      Intervention Plan    Intervention Prescribe, educate and counsel regarding individualized specific dietary modifications aiming towards targeted core components such as weight, hypertension, lipid management, diabetes, heart failure and other comorbidities.;Nutrition handout(s) given to patient.    Expected Outcomes Short Term Goal: Understand basic principles of dietary content, such as calories, fat, sodium, cholesterol and nutrients.;Long Term Goal: Adherence to prescribed nutrition plan.             Nutrition Assessments:  MEDIFICTS Score Key: >=70 Need to make dietary changes  40-70 Heart Healthy Diet <= 40 Therapeutic Level Cholesterol Diet  Flowsheet Row PULMONARY REHAB CHRONIC OBSTRUCTIVE PULMONARY DISEASE from 02/22/2022 in [facility]  Picture Your Plate Total Score on Discharge 50      Picture Your Plate Scores: <40 Unhealthy dietary pattern with much room for improvement. 41-50 Dietary pattern unlikely to meet recommendations for good health and room for improvement. 51-60 More healthful dietary pattern, with some room for improvement.  >60 Healthy dietary pattern, although there may be some specific behaviors that could be improved.    Nutrition Goals Re-Evaluation:  Nutrition Goals Re-Evaluation     Row Name 11/01/22 1307 11/29/22 1532           Goals   Current Weight 177 lb 14.6 oz (80.7 kg) 177 lb 14.6 oz (80.7 kg)      Comment LDL 134, cholesterol 208, J8J 5.8 A1c 5.9; other most recent labs  LDL 134, cholesterol 208      Expected Outcome Jill Glenn has medical history of pulmonary HTN, COPD3, chronic respiratory failure. Per [facility] transplant RD documentation on 10/05/22, it is recommended that she lose weight to BMI 30/170# and ultimately BMI 27/150#. She does report history of over snacking on refined carbohydates, snacking in the middle of the night, etc. She has previously completed pulmonary rehab in February 2024; her  starting weight at that time was 69.8kg/153.6#. She reports using MyFitness Pal app  to aid with tracking calories with goal set at ~1200kcals per patient. Nakai would continue to benefit from weight loss and decrease intake of refined carbohydrates to support pulmonary disease. Goals in progress. Maaya has medical history of pulmonary HTN, COPD3, chronic respiratory failure. Per [facility] transplant RD documentation on 10/05/22, it is recommended that she lose weight to BMI 30/170# and ultimately BMI 27/150#. She does report history of over snacking on refined carbohydates, snacking in the middle of the night, etc. She has previously completed pulmonary rehab in February 2024; her starting weight at that time was 69.8kg/153.6#. She reports using MyFitness Pal app to aid with tracking calories with goal set at ~1200kcals per patient. She has maintained her weight since starting with our program. She does acknowledge some emotional eating/binge type eating tendencies; she will begin seeing a behavioral health counselor. Jill Glenn would continue to benefit from weight loss and decrease intake of refined carbohydrates to support pulmonary disease.               Nutrition Goals Discharge (Final Nutrition Goals Re-Evaluation):  Nutrition Goals Re-Evaluation - 11/29/22 1532       Goals   Current Weight 177 lb 14.6 oz (80.7 kg)    Comment A1c 5.9; other most recent labs  LDL 134, cholesterol 208    Expected Outcome Goals in progress. Jill Glenn has medical history of pulmonary HTN, COPD3, chronic respiratory failure. Per [facility] transplant RD documentation on 10/05/22, it is recommended that she lose weight to BMI 30/170# and ultimately BMI 27/150#. She does report history of over snacking on refined carbohydates, snacking in the middle of the night, etc. She has previously completed pulmonary rehab in February 2024; her starting weight at that time was 69.8kg/153.6#. She reports using MyFitness Pal app to aid with tracking  calories with goal set at ~1200kcals per patient. She has maintained her weight since starting with our program. She does acknowledge some emotional eating/binge type eating tendencies; she will begin seeing a behavioral health counselor. Jill Glenn would continue to benefit from weight loss and decrease intake of refined carbohydrates to support pulmonary disease.             Psychosocial: Target Goals: Acknowledge presence or absence of significant depression and/or stress, maximize coping skills, provide positive support system. Participant is able to verbalize types and ability to use techniques and skills needed for reducing stress and depression.  Initial Review & Psychosocial Screening:  Initial Psych Review & Screening - 10/26/22 1103       Initial Review   Current issues with Current Depression;Current Anxiety/Panic;History of Depression;Current Stress Concerns    Comments Pt is stressed about her health and the ability to not work anymore.      Family Dynamics   Good Support System? Yes    Comments Husband, Alessandra Bevels, 2 daughters      Barriers   Psychosocial barriers to participate in program Psychosocial barriers identified (see note);The patient should benefit from training in stress management and relaxation.      Screening Interventions   Interventions Encouraged to exercise;To provide support and resources with identified psychosocial needs    Expected Outcomes Short Term goal: Utilizing psychosocial counselor, staff and physician to assist with identification of specific Stressors or current issues interfering with healing process. Setting desired goal for each stressor or current issue identified.;Long Term Goal: Stressors or current issues are controlled or eliminated.;Short Term goal: Identification and review with participant of any Quality of Life or Depression concerns found  by scoring the questionnaire.;Long Term goal: The participant improves quality of Life and PHQ9  Scores as seen by post scores and/or verbalization of changes             Quality of Life Scores:  Scores of 19 and below usually indicate a poorer quality of life in these areas.  A difference of  2-3 points is a clinically meaningful difference.  A difference of 2-3 points in the total score of the Quality of Life Index has been associated with significant improvement in overall quality of life, self-image, physical symptoms, and general health in studies assessing change in quality of life.  PHQ-9: Review Flowsheet  More data exists      11/28/2022 10/26/2022 02/22/2022 11/22/2021 03/22/2021  Depression screen PHQ 2/9  Decreased Interest 0  2 1 1 1   Down, Depressed, Hopeless 0  2 0 1 1  PHQ - 2 Score 0  4 1 2 2   Altered sleeping  3 1 2 3   Tired, decreased energy  3 1 1 3   Change in appetite  3 1 2 1   Feeling bad or failure about yourself   3 2 3  0  Trouble concentrating  0 0 0 0  Moving slowly or fidgety/restless  0 0 1 0  Suicidal thoughts  0 0 0 0  PHQ-9 Score  16 6 11 9   Difficult doing work/chores  Somewhat difficult Not difficult at all Somewhat difficult -    Details       Information is confidential and restricted. Go to Review Flowsheets to unlock data.   Multiple values from one day are sorted in reverse-chronological order        Interpretation of Total Score  Total Score Depression Severity:  1-4 = Minimal depression, 5-9 = Mild depression, 10-14 = Moderate depression, 15-19 = Moderately severe depression, 20-27 = Severe depression   Psychosocial Evaluation and Intervention:  Psychosocial Evaluation - 10/26/22 1105       Psychosocial Evaluation & Interventions   Interventions Stress management education;Encouraged to exercise with the program and follow exercise prescription    Comments Sayge is currently stressed about her health and not being able to work anymore. 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 She has  finally been approved for disability so this has helped to decrease some of her stress. She is currently being worked up for a lung transplant at [facility]. She does admit to feeling depressed, but states she is getting ready to start therapy. She is also going to ask her therapist about starting psychotropic meds.    Expected Outcomes For Jill Glenn to participate in PR free of any psychosocial barreris or concerns    Continue Psychosocial Services  Follow up required by staff             Psychosocial Re-Evaluation:  Psychosocial Re-Evaluation     Row Name 10/28/22 1521 11/23/22 1130 12/16/22 0924         Psychosocial Re-Evaluation   Current issues with Current Stress Concerns;Current Anxiety/Panic;History of Depression;Current Depression Current Stress Concerns;Current Anxiety/Panic;History of Depression;Current Depression Current Depression;History of Depression;Current Psychotropic Meds;Current Anxiety/Panic;Current Stress Concerns     Comments No changes since orientation on 10/26/22. Jill Glenn is scheduled to start PR on 10/29 Jill Glenn has met with her psychiatrist and has also been referred to a counseler. No needs at this time. Jill Glenn denies any new psychosocial barriers or concerns. She has started taking psychotropic meds to help with her  anxiety and depression. She is also working with a therapist to help her navigate her feelings of worry/anger.     Expected Outcomes For Aylanie to attend the program without psychosocial issues or concerns. For Jill Glenn to attend the program with less psychosocial issues or concerns. For Jill Glenn to attend the program with less psychosocial issues or concerns.     Interventions Encouraged to attend Pulmonary Rehabilitation for the exercise;Stress management education;Relaxation education Encouraged to attend Pulmonary Rehabilitation for the exercise Encouraged to attend Pulmonary Rehabilitation for the exercise;Stress management education     Continue Psychosocial  Services  Follow up required by staff Follow up required by staff Follow up required by staff              Psychosocial Discharge (Final Psychosocial Re-Evaluation):  Psychosocial Re-Evaluation - 12/16/22 0924       Psychosocial Re-Evaluation   Current issues with Current Depression;History of Depression;Current Psychotropic Meds;Current Anxiety/Panic;Current Stress Concerns    Comments Eileen denies any new psychosocial barriers or concerns. She has started taking psychotropic meds to help with her anxiety and depression. She is also working with a therapist to help her navigate her feelings of worry/anger.    Expected Outcomes For Jill Glenn to attend the program with less psychosocial issues or concerns.    Interventions Encouraged to attend Pulmonary Rehabilitation for the exercise;Stress management education    Continue Psychosocial Services  Follow up required by staff             Education: Education Goals: Education classes will be provided on a weekly basis, covering required topics. Participant will state understanding/return demonstration of topics presented.  Learning Barriers/Preferences:  Learning Barriers/Preferences - 10/26/22 1105       Learning Barriers/Preferences   Learning Barriers None    Learning Preferences Audio;Group Instruction;Individual Instruction;Verbal Instruction;Video;Written Material             Education Topics: Know Your Numbers Group instruction that is supported by a PowerPoint presentation. Instructor discusses importance of knowing and understanding resting, exercise, and post-exercise oxygen saturation, heart rate, and blood pressure. Oxygen saturation, heart rate, blood pressure, rating of perceived exertion, and dyspnea are reviewed along with a normal range for these values.    Exercise for the Pulmonary Patient Group instruction that is supported by a PowerPoint presentation. Instructor discusses benefits of exercise, core  components of exercise, frequency, duration, and intensity of an exercise routine, importance of utilizing pulse oximetry during exercise, safety while exercising, and options of places to exercise outside of rehab.  Flowsheet Row PULMONARY REHAB CHRONIC OBSTRUCTIVE PULMONARY DISEASE from 12/30/2021 in [facility]  Date 12/30/21  Educator EP  Instruction Review Code 1- Verbalizes Understanding       MET Level  Group instruction provided by PowerPoint, verbal discussion, and written material to support subject matter. Instructor reviews what METs are and how to increase METs.  Flowsheet Row PULMONARY REHAB CHRONIC OBSTRUCTIVE PULMONARY DISEASE from 11/24/2022 in [facility], [facility]  Date 11/24/22  Educator EP  Instruction Review Code 1- Verbalizes Understanding       Pulmonary Medications Verbally interactive group education provided by instructor with focus on inhaled medications and proper administration. Flowsheet Row PULMONARY REHAB CHRONIC OBSTRUCTIVE PULMONARY DISEASE from 12/22/2022 in [facility] for [facility], V[facility], [facility]  Date 12/22/22  Educator RT  Instruction Review Code 1- Verbalizes Understanding  Anatomy and Physiology of the Respiratory System Group instruction provided by PowerPoint, verbal discussion, and written material to support subject matter. Instructor reviews respiratory cycle and anatomical components of the respiratory system and their functions. Instructor also reviews differences in obstructive and restrictive respiratory diseases with examples of each.  Flowsheet Row PULMONARY REHAB CHRONIC OBSTRUCTIVE PULMONARY DISEASE from 12/15/2022 in [facility], [facility]  Date 12/15/22  Educator RT  Instruction Review Code 1- Verbalizes Understanding       Oxygen  Safety Group instruction provided by PowerPoint, verbal discussion, and written material to support subject matter. There is an overview of "What is Oxygen" and "Why do we need it".  Instructor also reviews how to create a safe environment for oxygen use, the importance of using oxygen as prescribed, and the risks of noncompliance. There is a brief discussion on traveling with oxygen and resources the patient may utilize. Flowsheet Row PULMONARY REHAB CHRONIC OBSTRUCTIVE PULMONARY DISEASE from 01/27/2022 in [facility]  Date 01/27/22  Educator RN  Instruction Review Code 1- Verbalizes Understanding       Oxygen Use Group instruction provided by PowerPoint, verbal discussion, and written material to discuss how supplemental oxygen is prescribed and different types of oxygen supply systems. Resources for more information are provided.    Breathing Techniques Group instruction that is supported by demonstration and informational handouts. Instructor discusses the benefits of pursed lip and diaphragmatic breathing and detailed demonstration on how to perform both.  Flowsheet Row PULMONARY REHAB CHRONIC OBSTRUCTIVE PULMONARY DISEASE from 02/10/2022 in [facility]  Date 02/10/22  Educator EP  Instruction Review Code 1- Verbalizes Understanding  [Handout also provided]        Risk Factor Reduction Group instruction that is supported by a PowerPoint presentation. Instructor discusses the definition of a risk factor, different risk factors for pulmonary disease, and how the heart and lungs work together. Flowsheet Row PULMONARY REHAB CHRONIC OBSTRUCTIVE PULMONARY DISEASE from 11/17/2022 in [facility]  Date 11/17/22  Educator EP  Instruction Review Code 1- Verbalizes Understanding       Pulmonary Diseases Group  instruction provided by PowerPoint, verbal discussion, and written material to support subject matter. Instructor gives an overview of the different type of pulmonary diseases. There is also a discussion on risk factors and symptoms as well as ways to manage the diseases. Flowsheet Row PULMONARY REHAB CHRONIC OBSTRUCTIVE PULMONARY DISEASE from 12/08/2022 in [facility]  Date 12/08/22  Educator RT  Instruction Review Code 1- Verbalizes Understanding       Stress and Energy Conservation Group instruction provided by PowerPoint, verbal discussion, and written material to support subject matter. Instructor gives an overview of stress and the impact it can have on the body. Instructor also reviews ways to reduce stress. There is also a discussion on energy conservation and ways to conserve energy throughout the day. Flowsheet Row PULMONARY REHAB CHRONIC OBSTRUCTIVE PULMONARY DISEASE from 11/03/2022 in [facility]  Date 11/03/22  Educator RN  Instruction Review Code 1- Verbalizes Understanding       Warning Signs and Symptoms Group instruction provided by PowerPoint, verbal discussion, and written material to support subject matter. Instructor reviews warning signs and symptoms of stroke,  heart attack, cold and flu. Instructor also reviews ways to prevent the spread of infection. Flowsheet Row PULMONARY REHAB CHRONIC OBSTRUCTIVE PULMONARY DISEASE from 11/10/2022 in [facility], [facility]  Date 11/10/22  Educator RN  Instruction Review Code 1- Verbalizes Understanding       Other Education Group or individual verbal, written, or video instructions that support the educational goals of the pulmonary rehab program. Flowsheet Row PULMONARY REHAB CHRONIC OBSTRUCTIVE PULMONARY DISEASE from 03/01/2022 in [facility] for Heart, Vascular, & Lung Health  Date 03/01/22  Educator EP  Instruction Review Code 1- Verbalizes Understanding        Knowledge Questionnaire Score:  Knowledge Questionnaire Score - 10/26/22 1135       Knowledge Questionnaire Score   Pre Score 17/18             Core Components/Risk Factors/Patient Goals at Admission:  Personal Goals and Risk Factors at Admission - 10/26/22 1105       Core Components/Risk Factors/Patient Goals on Admission    Weight Management Yes;Weight Loss   pt wants to lose 10lbs   Intervention Weight Management: Develop a combined nutrition and exercise program designed to reach desired caloric intake, while maintaining appropriate intake of nutrient and fiber, sodium and fats, and appropriate energy expenditure required for the weight goal.;Weight Management: Provide education and appropriate resources to help participant work on and attain dietary goals.;Weight Management/Obesity: Establish reasonable short term and long term weight goals.;Obesity: Provide education and appropriate resources to help participant work on and attain dietary goals.    Expected Outcomes Short Term: Continue to assess and modify interventions until short term weight is achieved;Long Term: Adherence to nutrition and physical activity/exercise program aimed toward attainment of established weight goal;Weight Loss: Understanding of general recommendations for a balanced deficit meal plan, which promotes 1-2 lb weight loss per week and includes a negative energy balance of [phone] kcal/d;Understanding recommendations for meals to include 15-35% energy as protein, 25-35% energy from fat, 35-60% energy from carbohydrates, less than 200mg  of dietary cholesterol, 20-35 gm of total fiber daily;Understanding of distribution of calorie intake throughout the day with the consumption of 4-5 meals/snacks    Improve shortness of breath with ADL's Yes    Intervention Provide education,  individualized exercise plan and daily activity instruction to help decrease symptoms of SOB with activities of daily living.    Expected Outcomes Short Term: Improve cardiorespiratory fitness to achieve a reduction of symptoms when performing ADLs;Long Term: Be able to perform more ADLs without symptoms or delay the onset of symptoms    Stress Yes    Intervention Offer individual and/or small group education and counseling on adjustment to heart disease, stress management and health-related lifestyle change. Teach and support self-help strategies.;Refer participants experiencing significant psychosocial distress to appropriate mental health specialists for further evaluation and treatment. When possible, include family members and significant others in education/counseling sessions.    Expected Outcomes Short Term: Participant demonstrates changes in health-related behavior, relaxation and other stress management skills, ability to obtain effective social support, and compliance with psychotropic medications if prescribed.;Long Term: Emotional wellbeing is indicated by absence of clinically significant psychosocial distress or social isolation.             Core Components/Risk Factors/Patient Goals Review:   Goals and Risk Factor Review     Row Name 10/28/22 1523 11/23/22 1137 12/16/22 0928         Core  Components/Risk Factors/Patient Goals Review   Personal Goals Review Weight Management/Obesity;Improve shortness of breath with ADL's;Develop more efficient breathing techniques such as purse lipped breathing and diaphragmatic breathing and practicing self-pacing with activity.;Stress Weight Management/Obesity;Improve shortness of breath with ADL's;Develop more efficient breathing techniques such as purse lipped breathing and diaphragmatic breathing and practicing self-pacing with activity. Weight Management/Obesity;Improve shortness of breath with ADL's;Develop more efficient breathing  techniques such as purse lipped breathing and diaphragmatic breathing and practicing self-pacing with activity.;Stress     Review Unable to assess. Shamille is scheduled to start PR on 11/01/22 Goal progressing for weight loss. Majesty is working with staff dietitian to achieve her weight loss goals. Goal progressing on improving shortness of breath with ADL's. Goal progressing on developing more efficient breathing techniques such as purse lipped breathing and diaphragmatic breathing; and practicing self-pacing with activity. increase knowledge or respiratory medications and ability to use respiratory devices properly. Anwesha is requiring 2L of O2 to maintain sats >88%. She has also been able to transition from walking the track to walking on the treadmill. We will continue to monitor Taje's progress throughout the program. Goal progressing for weight loss. Goal progressing on improving shortness of breath with ADL's. Goal progressing on developing more efficient breathing techniques such as purse lipped breathing and diaphragmatic breathing; and practicing self-pacing with activity. Goal met on increasing knowledge of respiratory medications and ability to use respiratory devices properly. Petrea has demonstrated proper use of MDI to staff.  Goal progressing on decreasing stress. Hermie is currently working with a  therapist on her mental health issues. She has also started taking psychotropic meds. Dayna is requiring 2L of O2 to maintain sats >88% while exercising. We will continue to monitor Samora's progress throughout the program.     Expected Outcomes For Travia to lose weight, improve her SOB with ADLs, decrease stress, and develop more efficient breathing techniques such as purse lipped breathing and diaphragmatic breathing; and practicing self-pacing with activity For Adamarys to lose weight, improve her SOB with ADLs, decrease stress, and develop more efficient breathing techniques such as purse lipped  breathing and diaphragmatic breathing; and practicing self-pacing with activity For Jaliza to lose weight, improve her SOB with ADLs, decrease stress, and develop more efficient breathing techniques such as purse lipped breathing and diaphragmatic breathing; and practicing self-pacing with activity              Core Components/Risk Factors/Patient Goals at Discharge (Final Review):   Goals and Risk Factor Review - 12/16/22 0928       Core Components/Risk Factors/Patient Goals Review   Personal Goals Review Weight Management/Obesity;Improve shortness of breath with ADL's;Develop more efficient breathing techniques such as purse lipped breathing and diaphragmatic breathing and practicing self-pacing with activity.;Stress    Review Goal progressing for weight loss. Goal progressing on improving shortness of breath with ADL's. Goal progressing on developing more efficient breathing techniques such as purse lipped breathing and diaphragmatic breathing; and practicing self-pacing with activity. Goal met on increasing knowledge of respiratory medications and ability to use respiratory devices properly. Fedra has demonstrated proper use of MDI to staff.  Goal progressing on decreasing stress. Heleena is currently working with a  therapist on her mental health issues. She has also started taking psychotropic meds. Soundra is requiring 2L of O2 to maintain sats >88% while exercising. We will continue to monitor Lacrystal's progress throughout the program.    Expected Outcomes For Mckenze to lose weight, improve her SOB with ADLs, decrease stress, and  develop more efficient breathing techniques such as purse lipped breathing and diaphragmatic breathing; and practicing self-pacing with activity             ITP Comments:Pt is making expected progress toward Pulmonary Rehab goals after completing 11 session(s). Recommend continued exercise, life style modification, education, and utilization of breathing  techniques to increase stamina and strength, while also decreasing shortness of breath with exertion.  Dr. Mechele Collin is Medical Director for Pulmonary Rehab at [facility].

## 2022-12-29 ENCOUNTER — Encounter (HOSPITAL_COMMUNITY)
Admission: RE | Admit: 2022-12-29 | Discharge: 2022-12-29 | Disposition: A | Discharge status: Home / Self Care | Payer: 59 | Source: Ambulatory Visit | Attending: Internal Medicine | Admitting: Internal Medicine

## 2022-12-29 DIAGNOSIS — Z5189 Encounter for other specified aftercare: Secondary | ICD-10-CM | POA: Diagnosis not present

## 2022-12-29 DIAGNOSIS — J449 Chronic obstructive pulmonary disease, unspecified: Secondary | ICD-10-CM | POA: Diagnosis not present

## 2023-01-02 DIAGNOSIS — R0602 Shortness of breath: Secondary | ICD-10-CM | POA: Diagnosis not present

## 2023-01-02 DIAGNOSIS — J449 Chronic obstructive pulmonary disease, unspecified: Secondary | ICD-10-CM | POA: Diagnosis not present

## 2023-01-02 DIAGNOSIS — J432 Centrilobular emphysema: Secondary | ICD-10-CM | POA: Diagnosis not present

## 2023-01-03 ENCOUNTER — Encounter (HOSPITAL_COMMUNITY)
Admission: RE | Admit: 2023-01-03 | Discharge: 2023-01-03 | Disposition: A | Discharge status: Home / Self Care | Payer: 59 | Source: Ambulatory Visit | Attending: Internal Medicine | Admitting: Internal Medicine

## 2023-01-03 DIAGNOSIS — J449 Chronic obstructive pulmonary disease, unspecified: Secondary | ICD-10-CM

## 2023-01-03 DIAGNOSIS — Z5189 Encounter for other specified aftercare: Secondary | ICD-10-CM | POA: Diagnosis not present

## 2023-01-03 NOTE — Progress Notes (Signed)
 Daily Session Note  Patient Details  Name: Jill Glenn MRN: [id number] Date of Birth: 12-Dec-1965 Referring Provider:   Conrad Ports Pulmonary Rehab Walk Test from 10/26/2022 in [facility]  Referring Provider Meade       Encounter Date: 01/03/2023  Check In:  Session Check In - 01/03/23 1026       Check-In   Supervising physician immediately available to respond to emergencies [facility] MD immediately available    Physician(s) Orren Fabry, NP    Location [facility]    Staff Present Ronal Levin, RN, BSN;Pietrina Jagodzinski, RT;Randi Jim Thorpe BS, ACSM-CEP, Exercise Physiologist    Virtual Visit No    Medication changes reported     No    Fall or balance concerns reported    No    Tobacco Cessation No Change    Warm-up and Cool-down Performed as group-led instruction    Resistance Training Performed Yes    VAD Patient? No    PAD/SET Patient? No      Pain Assessment   Currently in Pain? No/denies    Multiple Pain Sites No             Capillary Blood Glucose: No results found for this or any previous visit (from the past 24 hours).    Social History   Tobacco Use  Smoking Status Former   Current packs/day: 0.00   Average packs/day: 1 pack/day for 40.6 years (40.6 ttl pk-yrs)   Types: Cigarettes   Start date: 03/05/1981   Quit date: 10/14/2021   Years since quitting: 1.2  Smokeless Tobacco Never    Goals Met:  Proper associated with RPD/PD & O2 Sat Independence with exercise equipment Exercise tolerated well No report of concerns or symptoms today Strength training completed today  Goals Unmet:  Not Applicable  Comments: Service time is from 1006 to 1141.    Dr. Slater Staff is Medical Director for Pulmonary Rehab at [facility].

## 2023-01-05 ENCOUNTER — Encounter (HOSPITAL_COMMUNITY)
Admission: RE | Admit: 2023-01-05 | Discharge: 2023-01-05 | Disposition: A | Discharge status: Home / Self Care | Payer: 59 | Source: Ambulatory Visit | Attending: Internal Medicine | Admitting: Internal Medicine

## 2023-01-05 VITALS — Wt 180.3 lb

## 2023-01-05 DIAGNOSIS — J449 Chronic obstructive pulmonary disease, unspecified: Secondary | ICD-10-CM | POA: Insufficient documentation

## 2023-01-05 NOTE — Progress Notes (Signed)
 Daily Session Note  Patient Details  Name: CECILLIA MENEES MRN: [id number] Date of Birth: 1965-08-28 Referring Provider:   Conrad Ports Pulmonary Rehab Walk Test from 10/26/2022 in [facility]  Referring Provider Meade       Encounter Date: 01/05/2023  Check In:  Session Check In - 01/05/23 1111       Check-In   Physician(s) Rosaline Skains, NP    Location [facility]    Staff Present Ronal Levin, RN, BSN;Zymir Napoli Claudene, RT;Randi Reeve BS, ACSM-CEP, Exercise Physiologist    Virtual Visit No    Medication changes reported     No    Fall or balance concerns reported    No    Tobacco Cessation No Change    Warm-up and Cool-down Performed as group-led instruction    Resistance Training Performed Yes    VAD Patient? No    PAD/SET Patient? No      Pain Assessment   Currently in Pain? No/denies    Multiple Pain Sites No             Capillary Blood Glucose: No results found for this or any previous visit (from the past 24 hours).    Social History   Tobacco Use  Smoking Status Former   Current packs/day: 0.00   Average packs/day: 1 pack/day for 40.6 years (40.6 ttl pk-yrs)   Types: Cigarettes   Start date: 03/05/1981   Quit date: 10/14/2021   Years since quitting: 1.2  Smokeless Tobacco Never    Goals Met:  Proper associated with RPD/PD & O2 Sat Independence with exercise equipment Exercise tolerated well No report of concerns or symptoms today Strength training completed today  Goals Unmet:  Not Applicable  Comments: Service time is from 1007 to 1134.    Dr. Slater Staff is Medical Director for Pulmonary Rehab at [facility].
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
## 2023-01-09 ENCOUNTER — Ambulatory Visit (HOSPITAL_BASED_OUTPATIENT_CLINIC_OR_DEPARTMENT_OTHER): Payer: 59 | Admitting: Student

## 2023-01-09 ENCOUNTER — Telehealth: Payer: Self-pay | Admitting: Internal Medicine

## 2023-01-09 ENCOUNTER — Encounter (HOSPITAL_COMMUNITY): Payer: Self-pay | Admitting: Student

## 2023-01-09 DIAGNOSIS — F322 Major depressive disorder, single episode, severe without psychotic features: Secondary | ICD-10-CM | POA: Diagnosis not present

## 2023-01-09 DIAGNOSIS — F411 Generalized anxiety disorder: Secondary | ICD-10-CM

## 2023-01-09 DIAGNOSIS — F4310 Post-traumatic stress disorder, unspecified: Secondary | ICD-10-CM

## 2023-01-09 MED ORDER — HYDROXYZINE HCL 25 MG PO TABS: 25.0000 mg | ORAL_TABLET | Freq: Every evening | ORAL | 1 refills | Status: DC | PRN

## 2023-01-09 MED ORDER — BUSPIRONE HCL 15 MG PO TABS: 15.0000 mg | ORAL_TABLET | Freq: Two times a day (BID) | ORAL | 1 refills | Status: DC

## 2023-01-09 MED ORDER — CITALOPRAM HYDROBROMIDE 10 MG PO TABS
10.0000 mg | ORAL_TABLET | Freq: Every day | ORAL | 0 refills | Status: DC
Start: 2023-01-09 — End: 2023-02-15

## 2023-01-09 NOTE — Patient Instructions (Addendum)
 Dear Jill Glenn,  It was a pleasure meeting you today. Please look at the following for PHP/IOP programs that you may benefit from:  [email or web address] [phone]  You have also been referred to [facility] PHP/IOP program as well. Please expect a call to discuss if this would be a good option for you.   Take care, Dr. Lynnette

## 2023-01-09 NOTE — Telephone Encounter (Signed)
 [facility] calling. Call report.

## 2023-01-10 ENCOUNTER — Encounter (HOSPITAL_COMMUNITY): Payer: 59

## 2023-01-10 DIAGNOSIS — F17211 Nicotine dependence, cigarettes, in remission: Secondary | ICD-10-CM | POA: Diagnosis not present

## 2023-01-10 NOTE — Telephone Encounter (Signed)
 Call report. Please advise  IMPRESSION: 1. A solid 0.5 cm peripheral left upper lobe pulmonary nodule is new since 09/29/2022 screening chest CT. Recommend attention on follow-up noncontrast chest CT in 3-6 months in this high risk patient. 2. Separate previously described linear small peripheral apical left upper lobe focus of consolidation is mildly decreased in size since 09/29/2022 CT, favoring evolving postinfectious/postinflammatory scarring. 3. Two additional small left lower lobe pulmonary nodules are stable. 4. Small hiatal hernia. 5.  Emphysema (ICD10-J43.9).

## 2023-01-11 ENCOUNTER — Ambulatory Visit: Payer: 59 | Admitting: Internal Medicine

## 2023-01-11 ENCOUNTER — Encounter: Payer: Self-pay | Admitting: Internal Medicine

## 2023-01-11 VITALS — BP 108/74 | HR 70 | Temp 97.7°F | Ht 63.0 in | Wt 180.4 lb

## 2023-01-11 DIAGNOSIS — J9611 Chronic respiratory failure with hypoxia: Secondary | ICD-10-CM | POA: Diagnosis not present

## 2023-01-11 DIAGNOSIS — R911 Solitary pulmonary nodule: Secondary | ICD-10-CM | POA: Diagnosis not present

## 2023-01-11 DIAGNOSIS — J449 Chronic obstructive pulmonary disease, unspecified: Secondary | ICD-10-CM | POA: Diagnosis not present

## 2023-01-11 DIAGNOSIS — J309 Allergic rhinitis, unspecified: Secondary | ICD-10-CM | POA: Diagnosis not present

## 2023-01-11 MED ORDER — TRELEGY ELLIPTA 200-62.5-25 MCG/ACT IN AEPB: 1.0000 | INHALATION_SPRAY | Freq: Every day | RESPIRATORY_TRACT | 5 refills | Status: DC

## 2023-01-11 NOTE — Patient Instructions (Addendum)
 It was a pleasure to see you today!  Please schedule follow up scheduled with myself in 3 months.  If my schedule is not open yet, we will contact you with a reminder closer to that time. Please call [phone] if you haven't heard from us  a month before, and always call us  sooner if issues or concerns arise. You can also send us  a message through MyChart, but but aware that this is not to be used for urgent issues and it may take up to 5-7 days to receive a reply. Please be aware that you will likely be able to view your results before I have a chance to respond to them. Please give us  5 business days to respond to any non-urgent results.    Continue Trelegy - refilled today. Stop bevespi  and  budesonide .  Continue xyzal  and singulair  for allergies.   Stay active as you are doing.   Continue follow up with [facility] for lung transplantation and discussion for lung volume reduction surgery.  Continue pulmonary rehab.   CT Scan for nodule follow up in 3 months - March 2025. Ordered today.

## 2023-01-11 NOTE — Addendum Note (Signed)
 Addended by: Everlena Cooper on: 01/11/2023 11:14 AM   Modules accepted: Level of Service

## 2023-01-11 NOTE — Telephone Encounter (Signed)
 Addressed in OV

## 2023-01-11 NOTE — Progress Notes (Signed)
 Jill Glenn    [id number]    1965-03-15  Primary Care Physician:Jones, Debby LITTIE, MD Date of Appointment: 01/11/2023 Established Patient Visit  Chief complaint:   Chief Complaint  Patient presents with   Follow-up     HPI: Jill Glenn is a 58 y.o. woman with COPD, very severe FEV1 24% of predicted. She did all 13 weeks of pulmonary rehab, finished in feb 2024. Did really well, had dramatic improvement. She is on [facility] with POC. Quit smoking October 2023.   Interval Updates: Here for follow up.  Has seen [facility] for lung transplant evaluation. Could be a candidate. Also exploring LVRS. In pulmonary rehab.  Prior authorization for Breztri  was denied. Still on trelegy.  No interval hospitalizations or ED visits or COPD exacerbations  Still taking allergy  meds - xyzal  and singulair . Controlled.   Has been approved for disability for COPD.   Had a busy holiday season. Now watching grandkids in the evenings.   I have reviewed the patient's family social and past medical history and updated as appropriate.   Past Medical History:  Diagnosis Date   Allergy     seasonal   Anxiety    Asthma    COPD (chronic obstructive pulmonary disease) (HCC)    Depression    Emphysema of lung (HCC)    PTSD (post-traumatic stress disorder)     Past Surgical History:  Procedure Laterality Date   ABDOMINAL HYSTERECTOMY     BREAST EXCISIONAL BIOPSY     moles     mx 2 removed   TONSILLECTOMY      Family History  Problem Relation Age of Onset   Migraines Mother    COPD Mother    Lung cancer Mother    Bipolar disorder Mother    Colon polyps Father    Alcohol abuse Father    Colon cancer Neg Hx    Crohn's disease Neg Hx    Esophageal cancer Neg Hx    Rectal cancer Neg Hx    Stomach cancer Neg Hx    Ulcerative colitis Neg Hx     Social History   Occupational History   Occupation: audiological scientist  Tobacco Use   Smoking status: Former    Current packs/day: 0.00     Average packs/day: 1 pack/day for 40.6 years (40.6 ttl pk-yrs)    Types: Cigarettes    Start date: 03/05/1981    Quit date: 10/14/2021    Years since quitting: 1.2   Smokeless tobacco: Never  Vaping Use   Vaping status: Never Used  Substance and Sexual Activity   Alcohol use: Yes    Comment: rarely - approx. 1 beer/month   Drug use: No   Sexual activity: Yes    Partners: Male     Physical Exam: Blood pressure 108/74, pulse 70, temperature 97.7 F (36.5 C), temperature source Oral, height 5' 3 (1.6 m), weight 180 lb 6.4 oz (81.8 kg), SpO2 97%.  Gen:      No distress, on nasal cannula with POC, able to complete full sentences Lungs:    diminished, no wheeze CV:        Diminished, RRR, no mrg   Data Reviewed: Imaging: I have personally reviewed the CT Chest Jan 2025 which shows LUL 0.5cm pulmonary nodule, extensive bilateral upper lobe predominant emphysema  PFTs:     Latest Ref Rng & Units 10/15/2021    8:50 AM  PFT Results  FVC-Pre L 1.58  FVC-Predicted Pre % 47   FVC-Post L 1.92   FVC-Predicted Post % 58   Pre FEV1/FVC % % 40   Post FEV1/FCV % % 41   FEV1-Pre L 0.64   FEV1-Predicted Pre % 24   FEV1-Post L 0.79   DLCO uncorrected ml/min/mmHg 11.71   DLCO UNC% % 58   DLCO corrected ml/min/mmHg 11.71   DLCO COR %Predicted % 58   DLVA Predicted % 67   TLC L 6.59   TLC % Predicted % 134   RV % Predicted % 253    I have personally reviewed the patient's PFTs and very severe COPD FEv1  Labs:  Immunization status: Immunization History  Administered Date(s) Administered   Hepb-cpg 11/16/2022   Influenza, Seasonal, Injecte, Preservative Fre 11/16/2022   Influenza,inj,Quad PF,6+ Mos 12/21/2020, 10/14/2021   PNEUMOCOCCAL CONJUGATE-20 12/21/2020   Tdap 03/22/2021   Zoster Recombinant(Shingrix ) 03/22/2021, 04/12/2022   test October 2024 at [facility] Patient walked 10 laps 0 partials 329 meters/1080 feet- 66% on Room Air   External Records Personally Reviewed:  pulmonary, hospital, pcp  Assessment:  Very Severe COPD FEV 1 24% - transplant candidate evaluation at [facility] in process Seasonal allergic rhinitis, controlled History of tobacco use disorder - last smoked October 2023 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   Stay active as you are doing.   Continue follow up with [facility] for lung transplantation and discussion for lung volume reduction surgery.  Continue pulmonary rehab.   CT Scan for nodule follow up in 3 months - March 2025. Ordered today.   Return to Care: Return in about 3 months (around 04/11/2023).   Verdon Gore, MD Pulmonary and Critical Care Medicine [facility] Office:[phone]

## 2023-01-12 ENCOUNTER — Encounter (HOSPITAL_COMMUNITY)
Admission: RE | Admit: 2023-01-12 | Discharge: 2023-01-12 | Disposition: A | Discharge status: Home / Self Care | Payer: 59 | Source: Ambulatory Visit | Attending: Internal Medicine

## 2023-01-12 ENCOUNTER — Telehealth (HOSPITAL_COMMUNITY): Payer: Self-pay

## 2023-01-12 NOTE — Telephone Encounter (Signed)
 Received message on voicemail that Jill Glenn was not coming to class due to having a migraine.

## 2023-01-16 DIAGNOSIS — J449 Chronic obstructive pulmonary disease, unspecified: Secondary | ICD-10-CM | POA: Diagnosis not present

## 2023-01-16 DIAGNOSIS — R0609 Other forms of dyspnea: Secondary | ICD-10-CM | POA: Diagnosis not present

## 2023-01-17 ENCOUNTER — Encounter (HOSPITAL_COMMUNITY)
Admission: RE | Admit: 2023-01-17 | Discharge: 2023-01-17 | Disposition: A | Discharge status: Home / Self Care | Payer: 59 | Source: Ambulatory Visit | Attending: Internal Medicine | Admitting: Internal Medicine

## 2023-01-17 DIAGNOSIS — J449 Chronic obstructive pulmonary disease, unspecified: Secondary | ICD-10-CM

## 2023-01-17 NOTE — Progress Notes (Signed)
 Daily Session Note  Patient Details  Name: Jill Glenn MRN: [id number] Date of Birth: July 08, 1965 Referring Provider:   Conrad Ports Pulmonary Rehab Walk Test from 10/26/2022 in [facility] for Heart, Vascular, & Lung Health  Referring Provider Meade       Encounter Date: 01/17/2023  Check In:  Session Check In - 01/17/23 1028       Check-In   Supervising physician immediately available to respond to emergencies [facility] MD immediately available    Physician(s) Lamarr Satterfield, NP    Location [facility]    Staff Present Ronal Levin, RN, BSN;Genine Beckett Claudene, RT;Randi [facility] BS, ACSM-CEP, Exercise Physiologist;Kaylee Nicholaus, MS, ACSM-CEP, Exercise Physiologist    Virtual Visit No    Medication changes reported     No    Fall or balance concerns reported    No    Tobacco Cessation No Change    Warm-up and Cool-down Performed as group-led instruction    Resistance Training Performed Yes    VAD Patient? No    PAD/SET Patient? No      Pain Assessment   Currently in Pain? No/denies    Multiple Pain Sites No             Capillary Blood Glucose: No results found for this or any previous visit (from the past 24 hours).    Social History   Tobacco Use  Smoking Status Former   Current packs/day: 0.00   Average packs/day: 1 pack/day for 40.6 years (40.6 ttl pk-yrs)   Types: Cigarettes   Start date: 03/05/1981   Quit date: 10/14/2021   Years since quitting: 1.2  Smokeless Tobacco Never    Goals Met:  Proper associated with RPD/PD & O2 Sat Independence with exercise equipment Exercise tolerated well No report of concerns or symptoms today Strength training completed today  Goals Unmet:  Not Applicable  Comments: Service time is from 1008 to 1130.    Dr. Slater Staff is Medical Director for Pulmonary Rehab at [facility].

## 2023-01-18 ENCOUNTER — Encounter (HOSPITAL_COMMUNITY): Payer: Self-pay

## 2023-01-18 ENCOUNTER — Ambulatory Visit (INDEPENDENT_AMBULATORY_CARE_PROVIDER_SITE_OTHER): Payer: 59 | Admitting: Licensed Clinical Social Worker

## 2023-01-18 DIAGNOSIS — F322 Major depressive disorder, single episode, severe without psychotic features: Secondary | ICD-10-CM | POA: Diagnosis not present

## 2023-01-18 DIAGNOSIS — F411 Generalized anxiety disorder: Secondary | ICD-10-CM | POA: Diagnosis not present

## 2023-01-18 DIAGNOSIS — F4310 Post-traumatic stress disorder, unspecified: Secondary | ICD-10-CM

## 2023-01-18 NOTE — Progress Notes (Signed)
 THERAPIST PROGRESS NOTE   Session Date: 01/18/2023  Session Time: 1300 - 1405  Participation Level: Active  Behavioral Response: Casual and Well GroomedAlertAnxious and Depressed  Type of Therapy: Individual Therapy  Treatment Goals addressed:  Active    Anxiety    LTG: "Work on communicating my needs better"        Depression    LTG: Reduce frequency, intensity, and duration of depression symptoms so that daily functioning is improved      LTG: Increase coping skills to manage depression and improve ability to perform daily activities      LTG: "I just want to be happy"        ProgressTowards Goals: Initial  Interventions: CBT, Motivational Interviewing, and Supportive  Summary: Jill Glenn is a 58 y.o. female with past psych history of MDD, GAD, and PTSD, presenting for follow-up therapy session in efforts to improve management of depressive and anxious symptoms. Patient actively engaged in session, presenting in depressed and mildly anxious moods and congruent affect throughout.  Engaged in a brief review of the intake process and reassignment to new provider due to insurance adjustments, engaging further in readministering of PHQ-9 and GAD-7 Scaling's, and brief review of med man services.  Further engaged in exploration of history of presenting symptoms, beginning to explore history of childhood, traumatic events, and history throughout adulthood thus far.  Explored various transitions throughout life thus far, patient's abilities at managing such transitions, and consistent dedication and commitment to the provision and support of her family.  Further engaged in discussion surrounding observations related to presentation of depressive and anxious symptoms, history and prognosis of current medical conditions, and limited available supports.  Engage patient in development of treatment plan, exploring and identifying individualized treatment goals applicable to the management of  depressive and anxious symptoms.   Patient responded well to interventions. Patient continues to meet criteria for MDD, GAD, and PTSD. Patient will continue to benefit from engagement in outpatient therapy due to being the least restrictive service to meet presenting needs.      01/18/2023    1:19 PM 11/28/2022    1:11 PM 11/28/2022    9:06 AM 10/26/2022   11:30 AM 02/22/2022    3:43 PM  Depression screen PHQ 2/9  Decreased Interest 2 0 3 2 1   Down, Depressed, Hopeless 1 0 2 2 0  PHQ - 2 Score 3 0 5 4 1   Altered sleeping 3  3 3 1   Tired, decreased energy 2  3 3 1   Change in appetite 0  1 3 1   Feeling bad or failure about yourself  3  3 3 2   Trouble concentrating 0  0 0 0  Moving slowly or fidgety/restless 0  1 0 0  Suicidal thoughts 0  0 0 0  PHQ-9 Score 11  16 16 6   Difficult doing work/chores Somewhat difficult  Very difficult Somewhat difficult Not difficult at all   [employer] from 01/18/2023 in [facility] from 11/28/2022 in [facility] ED from 11/08/2022 in [facility] Department at [facility]  C-SSRS RISK CATEGORY Moderate Risk Moderate Risk No Risk         01/18/2023    1:17 PM 11/28/2022    9:04 AM  GAD 7 : Generalized Anxiety Score  Nervous, Anxious, on Edge 2 2  Control/stop worrying 3 3  Worry too much - different things 3 3  Trouble relaxing  3 3  Restless 2 2  Easily annoyed or irritable 3 3  Afraid - awful might happen 3 3  Total GAD 7 Score 19 19  Anxiety Difficulty Somewhat difficult Very difficult     Suicidal/Homicidal: Nowithout intent/plan  Therapist Response: Clinician utilized CBT, MI, and solution focused interventions to address pt's identified presenting problems and depressive and anxious sxs.  Readministered PHQ-9 and GAD-7, exploring any presenting nutrition concerns, and pain Scaling's.  Actively supported patient and reflection of  history and contributing factors to depressive and anxious symptoms, eliciting patient's recounts of childhood, early adulthood, in adulthood to date.  Prompted patient's engagement in development of individualized treatment goals applicable to the management of depression and anxiety for finalization of treatment plan.  Clinician reassessed severity of depressive and anxious sxs, and presence of any safety concerns. Therapist provided support and empathy to patient during session.  Plan: Return again in 3 weeks.  Diagnosis:  Encounter Diagnoses  Name Primary?   Severe major depression without psychotic features (HCC) Yes   Generalized anxiety disorder    PTSD (post-traumatic stress disorder)     Collaboration of Care: Other none necessary at this time.  Patient/Guardian was advised Release of Information must be obtained prior to any record release in order to collaborate their care with an outside provider. Patient/Guardian was advised if they have not already done so to contact the registration department to sign all necessary forms in order for us  to release information regarding their care.   Consent: Patient/Guardian gives verbal consent for treatment and assignment of benefits for services provided during this visit. Patient/Guardian expressed understanding and agreed to proceed.   Patsi Boots, MSW, LCSW 01/18/2023,  2:21 PM

## 2023-01-19 ENCOUNTER — Encounter (HOSPITAL_COMMUNITY)
Admission: RE | Admit: 2023-01-19 | Discharge: 2023-01-19 | Disposition: A | Discharge status: Home / Self Care | Payer: 59 | Source: Ambulatory Visit | Attending: Internal Medicine | Admitting: Internal Medicine

## 2023-01-19 DIAGNOSIS — J449 Chronic obstructive pulmonary disease, unspecified: Secondary | ICD-10-CM

## 2023-01-19 NOTE — Progress Notes (Signed)
Daily Session Note  Patient Details  Name: Jill Glenn MRN: [id number] Date of Birth: 1965-10-01 Referring Provider:   Doristine Devoid Pulmonary Rehab Walk Test from 10/26/2022 in [facility] for Heart, Vascular, & Lung Health  Referring Provider [name]       Encounter Date: 01/19/2023  Check In:  Session Check In - 01/19/23 1027       Check-In   Supervising physician immediately available to respond to emergencies [facility] MD immediately available    Physician(s) Joni Reining, NP    Location [facility]    Staff Present Essie Hart, RN, BSN;Casey Katrinka Blazing, RT;Wilhelmena [facility] BS, ACSM-CEP, Exercise Physiologist;Kaylee Earlene Plater, MS, ACSM-CEP, Exercise Physiologist    Virtual Visit No    Medication changes reported     No    Fall or balance concerns reported    No    Tobacco Cessation No Change    Warm-up and Cool-down Performed as group-led instruction    Resistance Training Performed Yes    VAD Patient? No    PAD/SET Patient? No      Pain Assessment   Currently in Pain? No/denies    Pain Score 0-No pain    Multiple Pain Sites No             Capillary Blood Glucose: No results found for this or any previous visit (from the past 24 hours).    Social History   Tobacco Use  Smoking Status Former   Current packs/day: 0.00   Average packs/day: 1 pack/day for 40.6 years (40.6 ttl pk-yrs)   Types: Cigarettes   Start date: 03/05/1981   Quit date: 10/14/2021   Years since quitting: 1.2  Smokeless Tobacco Never    Goals Met:  Independence with exercise equipment Exercise tolerated well No report of concerns or symptoms today Strength training completed today  Goals Unmet:  Not Applicable  Comments: Service time is from 1005 to 1140.    Dr. Mechele Collin is Medical Director for Pulmonary Rehab at [facility].

## 2023-01-24 ENCOUNTER — Encounter (HOSPITAL_COMMUNITY)
Admission: RE | Admit: 2023-01-24 | Discharge: 2023-01-24 | Disposition: A | Discharge status: Home / Self Care | Payer: 59 | Source: Ambulatory Visit | Attending: Internal Medicine | Admitting: Internal Medicine

## 2023-01-24 VITALS — Wt 178.8 lb

## 2023-01-24 DIAGNOSIS — J449 Chronic obstructive pulmonary disease, unspecified: Secondary | ICD-10-CM | POA: Diagnosis not present

## 2023-01-24 NOTE — Progress Notes (Signed)
Daily Session Note  Patient Details  Name: Jill Glenn MRN: [id number] Date of Birth: [birth year] Referring Provider:   Doristine Devoid Pulmonary Rehab Walk Test from 10/26/2022 in [facility] for Heart, Vascular, & Lung Health  Referring Provider [name]       Encounter Date: 01/24/2023  Check In:  Session Check In - 01/24/23 1132       Check-In   Supervising physician immediately available to respond to emergencies [facility] MD immediately available    Physician(s) Bernadene Person, NP    Location [facility]    Staff Present Essie Hart, RN, BSN;Cherylee Rawlinson Katrinka Blazing, Zella Richer, MS, ACSM-CEP, Exercise Physiologist;Johnny Hale Bogus, MS, Exercise Physiologist    Virtual Visit No    Medication changes reported     No    Fall or balance concerns reported    No    Tobacco Cessation No Change    Warm-up and Cool-down Performed as group-led instruction    Resistance Training Performed Yes    VAD Patient? No    PAD/SET Patient? No      Pain Assessment   Currently in Pain? No/denies    Multiple Pain Sites No             Capillary Blood Glucose: No results found for this or any previous visit (from the past 24 hours).   Exercise Prescription Changes - 01/24/23 1100       Response to Exercise   Blood Pressure (Admit) 100/60    Blood Pressure (Exercise) 118/64    Blood Pressure (Exit) 96/58    Heart Rate (Admit) 65 bpm    Heart Rate (Exercise) 86 bpm    Heart Rate (Exit) 72 bpm    Oxygen Saturation (Admit) 98 %    Oxygen Saturation (Exercise) 95 %    Oxygen Saturation (Exit) 98 %    Rating of Perceived Exertion (Exercise) 13    Perceived Dyspnea (Exercise) 1    Duration Continue with 30 min of aerobic exercise without signs/symptoms of physical distress.    Intensity THRR unchanged      Progression   Progression Continue to progress workloads to maintain intensity without signs/symptoms of physical distress.      Resistance Training    Training Prescription Yes    Weight blue bands    Reps 10-15    Time 10 Minutes      Oxygen   Oxygen Continuous    Liters 2      Treadmill   MPH 1.8    Grade 0    Minutes 15    METs 2.2      Recumbant Elliptical   Level 4    RPM 50    Minutes 15    METs 2.9      Oxygen   Maintain Oxygen Saturation 88% or higher             Social History   Tobacco Use  Smoking Status Former   Current packs/day: 0.00   Average packs/day: 1 pack/day for 40.6 years (40.6 ttl pk-yrs)   Types: Cigarettes   Start date: 03/05/1981   Quit date: 10/14/2021   Years since quitting: 1.2  Smokeless Tobacco Never    Goals Met:  Proper associated with RPD/PD & O2 Sat Independence with exercise equipment Exercise tolerated well No report of concerns or symptoms today Strength training completed today  Goals Unmet:  Not Applicable  Comments: Service time is from 1008 to 1133.  Dr. Mechele Collin is Medical Director for Pulmonary Rehab at [facility].

## 2023-01-25 NOTE — Progress Notes (Signed)
Pulmonary Individual Treatment Plan  Patient Details  Name: Jill Glenn MRN: [id number] Date of Birth: 09-07-1965 Referring Provider:   Doristine Devoid Pulmonary Rehab Walk Test from 10/26/2022 in [facility] for Heart, Vascular, & Lung [facility]  Referring Provider [name]       Initial Encounter Date:  Flowsheet Row Pulmonary Rehab Walk Test from 10/26/2022 in [facility] for Heart, Vascular, & Lung Health  Date 10/26/22       Visit Diagnosis: Stage 3 severe COPD by GOLD classification (HCC)  Patient's Home Medications on Admission:   Current Outpatient Medications:    acetaminophen (TYLENOL) 325 MG tablet, Take 2 tablets (650 mg total) by mouth every 6 (six) hours as needed for moderate pain, mild pain or headache., Disp: , Rfl:    albuterol (PROVENTIL) (2.5 MG/3ML) 0.083% nebulizer solution, Take 3 mLs (2.5 mg total) by nebulization every 6 (six) hours as needed for wheezing or shortness of breath., Disp: 75 mL, Rfl: 12   albuterol (VENTOLIN HFA) 108 (90 Base) MCG/ACT inhaler, Inhale 2 puffs into the lungs every 6 (six) hours as needed for wheezing or shortness of breath., Disp: 18 g, Rfl: 3   Ascorbic Acid (VITAMIN C GUMMIES PO), Take 500 mg by mouth daily., Disp: , Rfl:    busPIRone (BUSPAR) 15 MG tablet, Take 1 tablet (15 mg total) by mouth 2 (two) times daily., Disp: 60 tablet, Rfl: 1   citalopram (CELEXA) 10 MG tablet, Take 1 tablet (10 mg total) by mouth daily., Disp: 30 tablet, Rfl: 0   hydrOXYzine (ATARAX) 25 MG tablet, Take 1 tablet (25 mg total) by mouth at bedtime as needed (sleep)., Disp: 30 tablet, Rfl: 1   levocetirizine (XYZAL) 5 MG tablet, Take 1 tablet (5 mg total) by mouth every evening., Disp: 30 tablet, Rfl: 11   montelukast (SINGULAIR) 10 MG tablet, Take 1 tablet (10 mg total) by mouth at bedtime., Disp: 30 tablet, Rfl: 11   Multiple Vitamin (MULTIVITAMIN ADULT) TABS, Take 1 tablet by mouth daily with breakfast.,  Disp: , Rfl:    rizatriptan (MAXALT) 10 MG tablet, Take 1 tablet (10 mg total) by mouth once as needed for migraine. May repeat in 2 hours if needed, Disp: 30 tablet, Rfl: 2   TRELEGY ELLIPTA 200-62.5-25 MCG/ACT AEPB, Inhale 1 Inhalation into the lungs daily., Disp: 1 each, Rfl: 5   Ubrogepant (UBRELVY) 100 MG TABS, Take one tablet onset of Migraine May take another tablet 2 hours later, Don't exceed two tablets daily, Disp: 16 tablet, Rfl: 11   varenicline (CHANTIX) 1 MG tablet, Take 1 mg by mouth daily., Disp: , Rfl:   Past Medical History: Past Medical History:  Diagnosis Date   Allergy    seasonal   Anxiety    Asthma    COPD (chronic obstructive pulmonary disease) (HCC)    Depression    Emphysema of lung (HCC)    PTSD (post-traumatic stress disorder)     Tobacco Use: Social History   Tobacco Use  Smoking Status Former   Current packs/day: 0.00   Average packs/day: 1 pack/day for 40.6 years (40.6 ttl pk-yrs)   Types: Cigarettes   Start date: 03/05/1981   Quit date: 10/14/2021   Years since quitting: 1.2  Smokeless Tobacco Never    Labs: Review Flowsheet  More data may exist      Latest Ref Rng & Units 11/02/2020 12/21/2020 03/24/2022 04/12/2022 11/16/2022  Labs for ITP Cardiac and Pulmonary Rehab  Cholestrol  0 - 200 mg/dL - 706  - 237  -  LDL (calc) 0 - 99 mg/dL - 91  - 628  -  HDL-C >31.51 mg/dL - 76.16  - 07.37  -  Trlycerides 0.0 - 149.0 mg/dL - 10.6  - 269.4  -  Hemoglobin A1c 4.6 - 6.5 % - - 6.0  - 5.9   Bicarbonate 20.0 - 28.0 mmol/L 22.2  - - - -  TCO2 22 - 32 mmol/L 23  - - - -  Acid-base deficit 0.0 - 2.0 mmol/L 3.0  - - - -  O2 Saturation % 90.0  - - - -    Capillary Blood Glucose: Lab Results  Component Value Date   GLUCAP 73 03/26/2022   GLUCAP 148 (H) 03/25/2022   GLUCAP 166 (H) 03/25/2022   GLUCAP 88 03/25/2022   GLUCAP 84 03/25/2022     Pulmonary Assessment Scores:  Pulmonary Assessment Scores     Row Name 10/26/22 1135         ADL UCSD    ADL Phase Entry     SOB Score total 54       CAT Score   CAT Score 16       mMRC Score   mMRC Score 4             UCSD: Self-administered rating of dyspnea associated with activities of daily living (ADLs) 6-point scale (0 = "not at all" to 5 = "maximal or unable to do because of breathlessness")  Scoring Scores range from 0 to 120.  Minimally important difference is 5 units  CAT: CAT can identify the health impairment of COPD patients and is better correlated with disease progression.  CAT has a scoring range of zero to 40. The CAT score is classified into four groups of low (less than 10), medium (10 - 20), high (21-30) and very high (31-40) based on the impact level of disease on health status. A CAT score over 10 suggests significant symptoms.  A worsening CAT score could be explained by an exacerbation, poor medication adherence, poor inhaler technique, or progression of COPD or comorbid conditions.  CAT MCID is 2 points  mMRC: mMRC (Modified Medical Research Council) Dyspnea Scale is used to assess the degree of baseline functional disability in patients of respiratory disease due to dyspnea. No minimal important difference is established. A decrease in score of 1 point or greater is considered a positive change.   Pulmonary Function Assessment:  Pulmonary Function Assessment - 10/26/22 1109       Breath   Bilateral Breath Sounds Clear;Decreased    Shortness of Breath Limiting activity;Panic with Shortness of Breath   only panics if she doesn't have her inhaler with her            Exercise Target Goals: Exercise Program Goal: Individual exercise prescription set using results from initial 6 min walk test and THRR while considering  patient's activity barriers and safety.   Exercise Prescription Goal: Initial exercise prescription builds to 30-45 minutes a day of aerobic activity, 2-3 days per week.  Home exercise guidelines will be given to patient during  program as part of exercise prescription that the participant will acknowledge.  Activity Barriers & Risk Stratification:  Activity Barriers & Cardiac Risk Stratification - 10/26/22 1107       Activity Barriers & Cardiac Risk Stratification   Activity Barriers Deconditioning;Muscular Weakness;Shortness of Breath;Arthritis;Back Problems    Cardiac Risk Stratification Moderate  6 Minute Walk:  6 Minute Walk     Row Name 10/26/22 1201         6 Minute Walk   Phase Initial     Distance 1125 feet     Walk Time 6 minutes     # of Rest Breaks 1  2:12-3:00     MPH 2.13     METS 2.98     RPE 14.5     Perceived Dyspnea  3     VO2 Peak 10.43     Symptoms No     Resting HR 74 bpm     Resting BP 106/70     Resting Oxygen Saturation  95 %     Exercise Oxygen Saturation  during 6 min walk 84 %     Max Ex. HR 107 bpm     Max Ex. BP 110/70     2 Minute Post BP 106/60       Interval HR   1 Minute HR 83     2 Minute HR 85     3 Minute HR 90     4 Minute HR 89     5 Minute HR 107     6 Minute HR 98     2 Minute Post HR 76     Interval Heart Rate? Yes       Interval Oxygen   Interval Oxygen? Yes     Baseline Oxygen Saturation % 95 %     1 Minute Oxygen Saturation % 97 %     1 Minute Liters of Oxygen 0 L     2 Minute Oxygen Saturation % 89 %     2 Minute Liters of Oxygen 0 L     3 Minute Oxygen Saturation % 94 %  2:12 84%     3 Minute Liters of Oxygen 0 L  increased to 2L     4 Minute Oxygen Saturation % 92 %     4 Minute Liters of Oxygen 2 L     5 Minute Oxygen Saturation % 92 %     5 Minute Liters of Oxygen 2 L     6 Minute Oxygen Saturation % 89 %     6 Minute Liters of Oxygen 2 L     2 Minute Post Oxygen Saturation % 97 %     2 Minute Post Liters of Oxygen 2 L              Oxygen Initial Assessment:  Oxygen Initial Assessment - 10/26/22 1108       Home Oxygen   Home Oxygen Device Home Concentrator;Portable Concentrator;E-Tanks    Sleep Oxygen  Prescription Continuous    Liters per minute 2    Home Exercise Oxygen Prescription Pulsed    Liters per minute 2    Home Resting Oxygen Prescription None    Compliance with Home Oxygen Use Yes      Initial 6 min Walk   Oxygen Used Continuous    Liters per minute 2      Program Oxygen Prescription   Program Oxygen Prescription Continuous    Liters per minute 2      Intervention   Short Term Goals To learn and exhibit compliance with exercise, home and travel O2 prescription;To learn and understand importance of monitoring SPO2 with pulse oximeter and demonstrate accurate use of the pulse oximeter.;To learn and understand importance of maintaining oxygen saturations>88%;To learn and demonstrate proper pursed lip  breathing techniques or other breathing techniques. ;To learn and demonstrate proper use of respiratory medications    Long  Term Goals Exhibits compliance with exercise, home  and travel O2 prescription;Maintenance of O2 saturations>88%;Compliance with respiratory medication;Verbalizes importance of monitoring SPO2 with pulse oximeter and return demonstration;Exhibits proper breathing techniques, such as pursed lip breathing or other method taught during program session;Demonstrates proper use of MDI's             Oxygen Re-Evaluation:  Oxygen Re-Evaluation     Row Name 10/31/22 0848 11/23/22 1202 12/19/22 0909 01/16/23 0934       Program Oxygen Prescription   Program Oxygen Prescription Continuous Continuous Continuous Continuous    Liters per minute 2 2 2 2       Home Oxygen   Home Oxygen Device Home Concentrator;Portable Concentrator;E-Tanks Home Concentrator;Portable Concentrator;E-Tanks Home Concentrator;Portable Concentrator;E-Tanks Home Concentrator;Portable Concentrator;E-Tanks    Sleep Oxygen Prescription Continuous Continuous Continuous Continuous    Liters per minute 2 2 2 2     Home Exercise Oxygen Prescription Pulsed Pulsed Pulsed Pulsed    Liters per minute  2 2 2 2     Home Resting Oxygen Prescription None None None None    Compliance with Home Oxygen Use Yes Yes Yes Yes      Goals/Expected Outcomes   Short Term Goals To learn and exhibit compliance with exercise, home and travel O2 prescription;To learn and understand importance of monitoring SPO2 with pulse oximeter and demonstrate accurate use of the pulse oximeter.;To learn and understand importance of maintaining oxygen saturations>88%;To learn and demonstrate proper pursed lip breathing techniques or other breathing techniques. ;To learn and demonstrate proper use of respiratory medications To learn and exhibit compliance with exercise, home and travel O2 prescription;To learn and understand importance of monitoring SPO2 with pulse oximeter and demonstrate accurate use of the pulse oximeter.;To learn and understand importance of maintaining oxygen saturations>88%;To learn and demonstrate proper pursed lip breathing techniques or other breathing techniques. ;To learn and demonstrate proper use of respiratory medications To learn and exhibit compliance with exercise, home and travel O2 prescription;To learn and understand importance of monitoring SPO2 with pulse oximeter and demonstrate accurate use of the pulse oximeter.;To learn and understand importance of maintaining oxygen saturations>88%;To learn and demonstrate proper pursed lip breathing techniques or other breathing techniques. ;To learn and demonstrate proper use of respiratory medications To learn and exhibit compliance with exercise, home and travel O2 prescription;To learn and understand importance of monitoring SPO2 with pulse oximeter and demonstrate accurate use of the pulse oximeter.;To learn and understand importance of maintaining oxygen saturations>88%;To learn and demonstrate proper pursed lip breathing techniques or other breathing techniques. ;To learn and demonstrate proper use of respiratory medications    Long  Term Goals Exhibits  compliance with exercise, home  and travel O2 prescription;Maintenance of O2 saturations>88%;Compliance with respiratory medication;Verbalizes importance of monitoring SPO2 with pulse oximeter and return demonstration;Exhibits proper breathing techniques, such as pursed lip breathing or other method taught during program session;Demonstrates proper use of MDI's Exhibits compliance with exercise, home  and travel O2 prescription;Maintenance of O2 saturations>88%;Compliance with respiratory medication;Verbalizes importance of monitoring SPO2 with pulse oximeter and return demonstration;Exhibits proper breathing techniques, such as pursed lip breathing or other method taught during program session;Demonstrates proper use of MDI's Exhibits compliance with exercise, home  and travel O2 prescription;Maintenance of O2 saturations>88%;Compliance with respiratory medication;Verbalizes importance of monitoring SPO2 with pulse oximeter and return demonstration;Exhibits proper breathing techniques, such as pursed lip breathing or other method taught during  program session;Demonstrates proper use of MDI's Exhibits compliance with exercise, home  and travel O2 prescription;Maintenance of O2 saturations>88%;Compliance with respiratory medication;Verbalizes importance of monitoring SPO2 with pulse oximeter and return demonstration;Exhibits proper breathing techniques, such as pursed lip breathing or other method taught during program session;Demonstrates proper use of MDI's    Goals/Expected Outcomes Compliance and understanding of oxygen saturations monitoring and breathing techniques to decrease shortness of breath. Compliance and understanding of oxygen saturations monitoring and breathing techniques to decrease shortness of breath. Compliance and understanding of oxygen saturations monitoring and breathing techniques to decrease shortness of breath. Compliance and understanding of oxygen saturations monitoring and breathing  techniques to decrease shortness of breath.             Oxygen Discharge (Final Oxygen Re-Evaluation):  Oxygen Re-Evaluation - 01/16/23 0934       Program Oxygen Prescription   Program Oxygen Prescription Continuous    Liters per minute 2      Home Oxygen   Home Oxygen Device Home Concentrator;Portable Concentrator;E-Tanks    Sleep Oxygen Prescription Continuous    Liters per minute 2    Home Exercise Oxygen Prescription Pulsed    Liters per minute 2    Home Resting Oxygen Prescription None    Compliance with Home Oxygen Use Yes      Goals/Expected Outcomes   Short Term Goals To learn and exhibit compliance with exercise, home and travel O2 prescription;To learn and understand importance of monitoring SPO2 with pulse oximeter and demonstrate accurate use of the pulse oximeter.;To learn and understand importance of maintaining oxygen saturations>88%;To learn and demonstrate proper pursed lip breathing techniques or other breathing techniques. ;To learn and demonstrate proper use of respiratory medications    Long  Term Goals Exhibits compliance with exercise, home  and travel O2 prescription;Maintenance of O2 saturations>88%;Compliance with respiratory medication;Verbalizes importance of monitoring SPO2 with pulse oximeter and return demonstration;Exhibits proper breathing techniques, such as pursed lip breathing or other method taught during program session;Demonstrates proper use of MDI's    Goals/Expected Outcomes Compliance and understanding of oxygen saturations monitoring and breathing techniques to decrease shortness of breath.             Initial Exercise Prescription:  Initial Exercise Prescription - 10/26/22 1200       Date of Initial Exercise RX and Referring Provider   Date 10/26/22    Referring Provider [name]    Expected Discharge Date 01/19/23      Oxygen   Oxygen Continuous    Liters 2    Maintain Oxygen Saturation 88% or higher      Treadmill   MPH  1.7    Grade 0    Minutes 15      Recumbant Elliptical   Level 1    Minutes 15    METs 1.5      Prescription Details   Frequency (times per week) 2    Duration Progress to 30 minutes of continuous aerobic without signs/symptoms of physical distress      Intensity   THRR 40-80% of Max Heartrate 66-131    Ratings of Perceived Exertion 11-13    Perceived Dyspnea 0-4      Progression   Progression Continue progressive overload as per policy without signs/symptoms or physical distress.      Resistance Training   Training Prescription Yes    Weight blue bands    Reps 10-15             Perform Capillary  Blood Glucose checks as needed.  Exercise Prescription Changes:   Exercise Prescription Changes     Row Name 11/01/22 1200 11/15/22 1100 11/24/22 0941 12/08/22 1152 12/22/22 1158     Response to Exercise   Blood Pressure (Admit) 110/77 104/70 117/75 104/60 128/96   Blood Pressure (Exercise) 109/83 112/70 -- -- --   Blood Pressure (Exit) 97/67 104/68 96/58 100/64 108/62   Heart Rate (Admit) 77 bpm 80 bpm 81 bpm 77 bpm 72 bpm   Heart Rate (Exercise) 104 bpm 98 bpm 97 bpm 92 bpm 91 bpm   Heart Rate (Exit) 83 bpm 91 bpm 91 bpm 81 bpm 79 bpm   Oxygen Saturation (Admit) 99 % 97 % 95 % 98 % 97 %   Oxygen Saturation (Exercise) 96 % 93 % 91 % 95 % 98 %   Oxygen Saturation (Exit) 97 % 97 % 97 % 96 % 97 %   Rating of Perceived Exertion (Exercise) 13 13 12.5 12 13    Perceived Dyspnea (Exercise) 2 2 3 2 2    Duration Continue with 30 min of aerobic exercise without signs/symptoms of physical distress. Continue with 30 min of aerobic exercise without signs/symptoms of physical distress. Continue with 30 min of aerobic exercise without signs/symptoms of physical distress. Continue with 30 min of aerobic exercise without signs/symptoms of physical distress. Continue with 30 min of aerobic exercise without signs/symptoms of physical distress.   Intensity THRR unchanged THRR unchanged THRR  unchanged THRR unchanged THRR unchanged     Progression   Progression Continue to progress workloads to maintain intensity without signs/symptoms of physical distress. Continue to progress workloads to maintain intensity without signs/symptoms of physical distress. Continue to progress workloads to maintain intensity without signs/symptoms of physical distress. Continue to progress workloads to maintain intensity without signs/symptoms of physical distress. Continue to progress workloads to maintain intensity without signs/symptoms of physical distress.     Resistance Training   Training Prescription Yes Yes Yes Yes Yes   Weight blue bands blue bands blue bands blue bands blue bands   Reps 10-15 10-15 10-15 10-15 10-15   Time 10 Minutes 10 Minutes 10 Minutes 10 Minutes 10 Minutes     Interval Training   Interval Training -- -- -- Yes Yes   Equipment -- -- -- Treadmill Treadmill   Comments -- -- -- 75min@3mph  and 2% incline, @2mph  and 0% incline 76min@2mph &0%incline, 61min@mph &2%incline     Oxygen   Oxygen Continuous Continuous Continuous Continuous Continuous   Liters 2 2 2 2 2      Treadmill   MPH 1 1.1 2.1 3 --   Grade 0 0 1 2 --   Minutes 15 15 15 15  --   METs 1.6 2.1 2.2 4.1 --     Recumbant Elliptical   Level 1 1 2 2 3    Minutes 15 15 15 15 15    METs 2.1 2.6 2.6 3 2.4     Oxygen   Maintain Oxygen Saturation 88% or higher 88% or higher 88% or higher 88% or higher 88% or higher    Row Name 01/10/23 0900 01/24/23 1100           Response to Exercise   Blood Pressure (Admit) 96/66 100/60      Blood Pressure (Exercise) -- 118/64      Blood Pressure (Exit) 94/68 96/58      Heart Rate (Admit) 73 bpm 65 bpm      Heart Rate (Exercise) 106 bpm 86 bpm  Heart Rate (Exit) 84 bpm 72 bpm      Oxygen Saturation (Admit) 100 % 98 %      Oxygen Saturation (Exercise) 92 % 95 %      Oxygen Saturation (Exit) 98 % 98 %      Rating of Perceived Exertion (Exercise) 12.5 13       Perceived Dyspnea (Exercise) 2 1      Duration Continue with 30 min of aerobic exercise without signs/symptoms of physical distress. Continue with 30 min of aerobic exercise without signs/symptoms of physical distress.      Intensity THRR unchanged THRR unchanged        Progression   Progression Continue to progress workloads to maintain intensity without signs/symptoms of physical distress. Continue to progress workloads to maintain intensity without signs/symptoms of physical distress.        Resistance Training   Training Prescription Yes Yes      Weight blue bands blue bands      Reps 10-15 10-15      Time 10 Minutes 10 Minutes        Interval Training   Interval Training Yes --      Equipment Treadmill --      Comments 22min@1 .65mph&0%incline, 66min@3mph &2%incline --        Oxygen   Oxygen Continuous Continuous      Liters 2 2        Treadmill   MPH 2 1.8      Grade 3 0      Minutes 15 15      METs 4.12 2.2        Recumbant Elliptical   Level 3 4      RPM -- 50      Minutes 15 15      METs 2.7 2.9        Oxygen   Maintain Oxygen Saturation 88% or higher 88% or higher               Exercise Comments:   Exercise Comments     Row Name 11/01/22 1208           Exercise Comments Pt completed 1st day of exercise. She exercised for 15 min on the treadmill and recumbent elliptical. She averaged 1.6 METs at 1 mph and 0 incline on the treadmill and 2.1 METs at level 1 on the recumbent elliptical. Virjean performed the warmup and cooldown standing without limitations. Ahitana did not tolerate the treadmill well and will be transfered to the track. Pt understands METs from previous participating.                Exercise Goals and Review:   Exercise Goals     Row Name 10/26/22 1107             Exercise Goals   Increase Physical Activity Yes       Intervention Provide advice, education, support and counseling about physical activity/exercise needs.;Develop an  individualized exercise prescription for aerobic and resistive training based on initial evaluation findings, risk stratification, comorbidities and participant's personal goals.       Expected Outcomes Short Term: Attend rehab on a regular basis to increase amount of physical activity.;Long Term: Add in home exercise to make exercise part of routine and to increase amount of physical activity.;Long Term: Exercising regularly at least 3-5 days a week.       Increase Strength and Stamina Yes       Intervention Provide advice, education,  support and counseling about physical activity/exercise needs.;Develop an individualized exercise prescription for aerobic and resistive training based on initial evaluation findings, risk stratification, comorbidities and participant's personal goals.       Expected Outcomes Short Term: Increase workloads from initial exercise prescription for resistance, speed, and METs.;Short Term: Perform resistance training exercises routinely during rehab and add in resistance training at home;Long Term: Improve cardiorespiratory fitness, muscular endurance and strength as measured by increased METs and functional capacity ( )       Able to understand and use rate of perceived exertion (RPE) scale Yes       Intervention Provide education and explanation on how to use RPE scale       Expected Outcomes Short Term: Able to use RPE daily in rehab to express subjective intensity level;Long Term:  Able to use RPE to guide intensity level when exercising independently       Able to understand and use Dyspnea scale Yes       Intervention Provide education and explanation on how to use Dyspnea scale       Expected Outcomes Short Term: Able to use Dyspnea scale daily in rehab to express subjective sense of shortness of breath during exertion;Long Term: Able to use Dyspnea scale to guide intensity level when exercising independently       Knowledge and understanding of Target Heart Rate Range  (THRR) Yes       Intervention Provide education and explanation of THRR including how the numbers were predicted and where they are located for reference       Expected Outcomes Short Term: Able to state/look up THRR;Long Term: Able to use THRR to govern intensity when exercising independently;Short Term: Able to use daily as guideline for intensity in rehab       Understanding of Exercise Prescription Yes       Intervention Provide education, explanation, and written materials on patient's individual exercise prescription       Expected Outcomes Short Term: Able to explain program exercise prescription;Long Term: Able to explain home exercise prescription to exercise independently                Exercise Goals Re-Evaluation :  Exercise Goals Re-Evaluation     Row Name 10/31/22 0844 11/23/22 1159 12/19/22 0906 01/16/23 0922       Exercise Goal Re-Evaluation   Exercise Goals Review Increase Physical Activity;Able to understand and use Dyspnea scale;Understanding of Exercise Prescription;Increase Strength and Stamina;Knowledge and understanding of Target Heart Rate Range (THRR);Able to understand and use rate of perceived exertion (RPE) scale Increase Physical Activity;Able to understand and use Dyspnea scale;Understanding of Exercise Prescription;Increase Strength and Stamina;Knowledge and understanding of Target Heart Rate Range (THRR);Able to understand and use rate of perceived exertion (RPE) scale Increase Physical Activity;Able to understand and use Dyspnea scale;Understanding of Exercise Prescription;Increase Strength and Stamina;Knowledge and understanding of Target Heart Rate Range (THRR);Able to understand and use rate of perceived exertion (RPE) scale Increase Physical Activity;Able to understand and use Dyspnea scale;Understanding of Exercise Prescription;Increase Strength and Stamina;Knowledge and understanding of Target Heart Rate Range (THRR);Able to understand and use rate of  perceived exertion (RPE) scale    Comments Manar is scheduled to begin Pulmonary Rehab again on 10/29. Will continue to monitor and progress as able. Edwarda has completed 6 exercise sessions. She exercises for 15 min on the recumbent elliptical and treadmill. She averages 2.6 METs at level 2 on the recumbent elliptical and 2.1 METs at 1.7 mph on the  treadmill. She performs the warmup and cooldown standing without limitations. Jaquelyn has increased her workload for the recumbent elliptical as METs have remained the same. Rodina has also progressed to treadmill walking. She did not tolerate treadmill walking on her first day of exercise but does now. Will continue to monitor and progress as able. Nadeya has completed 9 exercise sessions. She exercises for 15 min on the recumbent elliptical and treadmill. She averages 3 METs at level 2 on the recumbent elliptical and 2.2 METs on the treadmill. She performs the warmup and cooldown standing without limitations. Leona has progressed to interval training on the treadmill. She walks at 1.8 mph for 2 min and 2 mph and 3% incline for 4 min. Cherlynn tolerates interval walking so far. Will increased level on recumbent elliptical. Will continue to monitor and progress as able. Cythia has completed 14 exercise sessions. She exercises for 15 min on the recumbent elliptical and treadmill. She averages 2.7 METs at level 3 on the recumbent elliptical and 3.2 METs on the treadmill. She performs the warmup and cooldown standing without limitations. She has increased her interval training. Low speed and incline is 3 mph with 2% incline and 2 mph with 1.5 incine. Analaura has not increased her recumbent elliptical level. Will encourage her to increase recumbent elliptical level. Will continue to monitor and progress as able.    Expected Outcomes Through exercise at rehab and home, the patient will decrease shortness of breath with daily activities and feel confident in carrying out an  exercise regimen at home. Through exercise at rehab and home, the patient will decrease shortness of breath with daily activities and feel confident in carrying out an exercise regimen at home. Through exercise at rehab and home, the patient will decrease shortness of breath with daily activities and feel confident in carrying out an exercise regimen at home. Through exercise at rehab and home, the patient will decrease shortness of breath with daily activities and feel confident in carrying out an exercise regimen at home.             Discharge Exercise Prescription (Final Exercise Prescription Changes):  Exercise Prescription Changes - 01/24/23 1100       Response to Exercise   Blood Pressure (Admit) 100/60    Blood Pressure (Exercise) 118/64    Blood Pressure (Exit) 96/58    Heart Rate (Admit) 65 bpm    Heart Rate (Exercise) 86 bpm    Heart Rate (Exit) 72 bpm    Oxygen Saturation (Admit) 98 %    Oxygen Saturation (Exercise) 95 %    Oxygen Saturation (Exit) 98 %    Rating of Perceived Exertion (Exercise) 13    Perceived Dyspnea (Exercise) 1    Duration Continue with 30 min of aerobic exercise without signs/symptoms of physical distress.    Intensity THRR unchanged      Progression   Progression Continue to progress workloads to maintain intensity without signs/symptoms of physical distress.      Resistance Training   Training Prescription Yes    Weight blue bands    Reps 10-15    Time 10 Minutes      Oxygen   Oxygen Continuous    Liters 2      Treadmill   MPH 1.8    Grade 0    Minutes 15    METs 2.2      Recumbant Elliptical   Level 4    RPM 50    Minutes 15  METs 2.9      Oxygen   Maintain Oxygen Saturation 88% or higher             Nutrition:  Target Goals: Understanding of nutrition guidelines, daily intake of sodium 1500mg , cholesterol 200mg , calories 30% from fat and 7% or less from saturated fats, daily to have 5 or more servings of fruits and  vegetables.  Biometrics:    Nutrition Therapy Plan and Nutrition Goals:  Nutrition Therapy & Goals - 11/29/22 1532       Nutrition Therapy   Diet General Healthy Diet      Personal Nutrition Goals   Nutrition Goal Patient to improve dietary quality by using the plate method as a daily guide for meal planning to include lean protein/plant protein, fruits, vegetables, whole grains, and nonfat/low fat dairy as part of well balanced diet   goal in progress.   Personal Goal #2 Patient to identify strategies for weight loss of 0.5-2.0# per week of weight loss.   goal not met.   Comments Goals in progress. Jaimey has medical history of pulmonary HTN, COPD3, chronic respiratory failure. Per [facility] transplant RD documentation on 10/05/22, it is recommended that she lose weight to BMI 30/170# and ultimately BMI 27/150#. She does report history of over snacking on refined carbohydates, snacking in the middle of the night, etc. She has previously completed pulmonary rehab in February 2024; her starting weight at that time was 69.8kg/153.6#. She reports using MyFitness Pal app to aid with tracking calories with goal set at ~1200kcals per patient. She has maintained her weight since starting with our program. She does acknowledge some emotional eating/binge type eating tendencies; she will begin seeing a behavioral health counselor.  Salvatrice would continue to benefit from weight loss and decrease intake of refined carbohydrates to support pulmonary disease.      Intervention Plan   Intervention Prescribe, educate and counsel regarding individualized specific dietary modifications aiming towards targeted core components such as weight, hypertension, lipid management, diabetes, heart failure and other comorbidities.;Nutrition handout(s) given to patient.    Expected Outcomes Short Term Goal: Understand basic principles of dietary content, such as calories, fat, sodium, cholesterol and nutrients.;Long Term Goal:  Adherence to prescribed nutrition plan.             Nutrition Assessments:  MEDIFICTS Score Key: >=70 Need to make dietary changes  40-70 Heart Healthy Diet <= 40 Therapeutic Level Cholesterol Diet  Flowsheet Row PULMONARY REHAB CHRONIC OBSTRUCTIVE PULMONARY DISEASE from [facility]/22/2022 in [facility]  Picture Your Plate Total Score on Discharge 50      Picture Your Plate Scores: <69 Unhealthy dietary pattern with much room for improvement. 41-50 Dietary pattern unlikely to meet recommendations for good health and room for improvement. 51-60 More healthful dietary pattern, with some room for improvement.  >60 Healthy dietary pattern, although there may be some specific behaviors that could be improved.    Nutrition Goals Re-Evaluation:  Nutrition Goals Re-Evaluation     Row Name 11/01/22 1307 11/29/22 1532           Goals   Current Weight 177 lb 14.6 oz (80.7 kg) 177 lb 14.6 oz (80.7 kg)      Comment LDL 134, cholesterol 208, G2X 5.8 A1c 5.9; other most recent labs  LDL 134, cholesterol 208      Expected Outcome Islay has medical history of pulmonary HTN, COPD3, chronic respiratory failure. Per [facility] transplant RD  documentation on 10/05/22, it is recommended that she lose weight to BMI 30/170# and ultimately BMI 27/150#. She does report history of over snacking on refined carbohydates, snacking in the middle of the night, etc. She has previously completed pulmonary rehab in February 2024; her starting weight at that time was 69.8kg/153.6#. She reports using MyFitness Pal app to aid with tracking calories with goal set at ~1200kcals per patient. Lailaa would continue to benefit from weight loss and decrease intake of refined carbohydrates to support pulmonary disease. Goals in progress. Marium has medical history of pulmonary HTN, COPD3, chronic respiratory failure. Per [facility] transplant RD documentation on 10/05/22, it is  recommended that she lose weight to BMI 30/170# and ultimately BMI 27/150#. She does report history of over snacking on refined carbohydates, snacking in the middle of the night, etc. She has previously completed pulmonary rehab in February 2024; her starting weight at that time was 69.8kg/153.6#. She reports using MyFitness Pal app to aid with tracking calories with goal set at ~1200kcals per patient. She has maintained her weight since starting with our program. She does acknowledge some emotional eating/binge type eating tendencies; she will begin seeing a behavioral health counselor. Araya would continue to benefit from weight loss and decrease intake of refined carbohydrates to support pulmonary disease.               Nutrition Goals Discharge (Final Nutrition Goals Re-Evaluation):  Nutrition Goals Re-Evaluation - 11/29/22 1532       Goals   Current Weight 177 lb 14.6 oz (80.7 kg)    Comment A1c 5.9; other most recent labs  LDL 134, cholesterol 208    Expected Outcome Goals in progress. Saidey has medical history of pulmonary HTN, COPD3, chronic respiratory failure. Per [facility] transplant RD documentation on 10/05/22, it is recommended that she lose weight to BMI 30/170# and ultimately BMI 27/150#. She does report history of over snacking on refined carbohydates, snacking in the middle of the night, etc. She has previously completed pulmonary rehab in February 2024; her starting weight at that time was 69.8kg/153.6#. She reports using MyFitness Pal app to aid with tracking calories with goal set at ~1200kcals per patient. She has maintained her weight since starting with our program. She does acknowledge some emotional eating/binge type eating tendencies; she will begin seeing a behavioral health counselor. Viyona would continue to benefit from weight loss and decrease intake of refined carbohydrates to support pulmonary disease.             Psychosocial: Target Goals: Acknowledge presence  or absence of significant depression and/or stress, maximize coping skills, provide positive support system. Participant is able to verbalize types and ability to use techniques and skills needed for reducing stress and depression.  Initial Review & Psychosocial Screening:  Initial Psych Review & Screening - 10/26/22 1103       Initial Review   Current issues with Current Depression;Current Anxiety/Panic;History of Depression;Current Stress Concerns    Comments Pt is stressed about her health and the ability to not work anymore.      Family Dynamics   Good Support System? Yes    Comments Husband, Alessandra Bevels, 2 daughters      Barriers   Psychosocial barriers to participate in program Psychosocial barriers identified (see note);The patient should benefit from training in stress management and relaxation.      Screening Interventions   Interventions Encouraged to exercise;To provide support and resources with identified psychosocial needs    Expected Outcomes  Short Term goal: Utilizing psychosocial counselor, staff and physician to assist with identification of specific Stressors or current issues interfering with healing process. Setting desired goal for each stressor or current issue identified.;Long Term Goal: Stressors or current issues are controlled or eliminated.;Short Term goal: Identification and review with participant of any Quality of Life or Depression concerns found by scoring the questionnaire.;Long Term goal: The participant improves quality of Life and PHQ9 Scores as seen by post scores and/or verbalization of changes             Quality of Life Scores:  Scores of 19 and below usually indicate a poorer quality of life in these areas.  A difference of  2-3 points is a clinically meaningful difference.  A difference of 2-3 points in the total score of the Quality of Life Index has been associated with significant improvement in overall quality of life, self-image, physical  symptoms, and general health in studies assessing change in quality of life.  PHQ-9: Review Flowsheet  More data exists      01/18/2023 11/28/2022 10/26/2022 02/22/2022 11/22/2021  Depression screen PHQ 2/9  Decreased Interest  0  2 1 1   Down, Depressed, Hopeless  0  2 0 1  PHQ - 2 Score  0  4 1 2   Altered sleeping   3 1 2   Tired, decreased energy   3 1 1   Change in appetite   3 1 2   Feeling bad or failure about yourself    3 2 3   Trouble concentrating   0 0 0  Moving slowly or fidgety/restless   0 0 1  Suicidal thoughts   0 0 0  PHQ-9 Score   16 6 11   Difficult doing work/chores   Somewhat difficult Not difficult at all Somewhat difficult    Details       Information is confidential and restricted. Go to Review Flowsheets to unlock data.   Multiple values from one day are sorted in reverse-chronological order        Interpretation of Total Score  Total Score Depression Severity:  1-4 = Minimal depression, 5-9 = Mild depression, 10-14 = Moderate depression, 15-19 = Moderately severe depression, 20-27 = Severe depression   Psychosocial Evaluation and Intervention:  Psychosocial Evaluation - 10/26/22 1105       Psychosocial Evaluation & Interventions   Interventions Stress management education;Encouraged to exercise with the program and follow exercise prescription    Comments Deauna is currently stressed about her health and not being able to work anymore. 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 She has finally been approved for disability so this has helped to decrease some of her stress. She is currently being worked up for a lung transplant at [facility]. She does admit to feeling depressed, but states she is getting ready to start therapy. She is also going to ask her therapist about starting psychotropic meds.    Expected Outcomes For Evin to participate in PR free of any psychosocial barreris or concerns    Continue Psychosocial  Services  Follow up required by staff             Psychosocial Re-Evaluation:  Psychosocial Re-Evaluation     Row Name 10/28/22 1521 11/23/22 1130 12/16/22 0924 01/18/23 1535       Psychosocial Re-Evaluation   Current issues with Current Stress Concerns;Current Anxiety/Panic;History of Depression;Current Depression Current Stress Concerns;Current Anxiety/Panic;History of Depression;Current Depression Current Depression;History of Depression;Current Psychotropic Meds;Current  Anxiety/Panic;Current Stress Concerns Current Depression;History of Depression;Current Psychotropic Meds;Current Anxiety/Panic;Current Stress Concerns    Comments No changes since orientation on 10/26/22. Bethani is scheduled to start PR on 10/29 Christyl has met with her psychiatrist and has also been referred to a counseler. No needs at this time. Erikah denies any new psychosocial barriers or concerns. She has started taking psychotropic meds to help with her anxiety and depression. She is also working with a therapist to help her navigate her feelings of worry/anger. Mariadejesus is currently struggling with making the decision to go through with an endobronchial valve replacement. She states her husband does not want her to due to the risk associated with the procedure. This decision she has to make has increased her stress level. She is still working with her mental health therapist and taking her psychotropic meds. She states she feels her depression is a little better.    Expected Outcomes For Eileena to attend the program without psychosocial issues or concerns. For Rayn to attend the program with less psychosocial issues or concerns. For Roxine to attend the program with less psychosocial issues or concerns. For Drucie to attend the program with less psychosocial issues or concerns.    Interventions Encouraged to attend Pulmonary Rehabilitation for the exercise;Stress management education;Relaxation education Encouraged to  attend Pulmonary Rehabilitation for the exercise Encouraged to attend Pulmonary Rehabilitation for the exercise;Stress management education Encouraged to attend Pulmonary Rehabilitation for the exercise;Stress management education    Continue Psychosocial Services  Follow up required by staff Follow up required by staff Follow up required by staff Follow up required by staff             Psychosocial Discharge (Final Psychosocial Re-Evaluation):  Psychosocial Re-Evaluation - 01/18/23 1535       Psychosocial Re-Evaluation   Current issues with Current Depression;History of Depression;Current Psychotropic Meds;Current Anxiety/Panic;Current Stress Concerns    Comments Haleema is currently struggling with making the decision to go through with an endobronchial valve replacement. She states her husband does not want her to due to the risk associated with the procedure. This decision she has to make has increased her stress level. She is still working with her mental health therapist and taking her psychotropic meds. She states she feels her depression is a little better.    Expected Outcomes For Ayahna to attend the program with less psychosocial issues or concerns.    Interventions Encouraged to attend Pulmonary Rehabilitation for the exercise;Stress management education    Continue Psychosocial Services  Follow up required by staff             Education: Education Goals: Education classes will be provided on a weekly basis, covering required topics. Participant will state understanding/return demonstration of topics presented.  Learning Barriers/Preferences:  Learning Barriers/Preferences - 10/26/22 1105       Learning Barriers/Preferences   Learning Barriers None    Learning Preferences Audio;Group Instruction;Individual Instruction;Verbal Instruction;Video;Written Material             Education Topics: Know Your Numbers Group instruction that is supported by a PowerPoint  presentation. Instructor discusses importance of knowing and understanding resting, exercise, and post-exercise oxygen saturation, heart rate, and blood pressure. Oxygen saturation, heart rate, blood pressure, rating of perceived exertion, and dyspnea are reviewed along with a normal range for these values.  Flowsheet Row PULMONARY REHAB CHRONIC OBSTRUCTIVE PULMONARY DISEASE from 01/05/2023 in [facility] for Heart, Vascular, & Lung Health  Date 01/05/23  Educator EP  Instruction Review Code 1- Verbalizes Understanding       Exercise for the Pulmonary Patient Group instruction that is supported by a PowerPoint presentation. Instructor discusses benefits of exercise, core components of exercise, frequency, duration, and intensity of an exercise routine, importance of utilizing pulse oximetry during exercise, safety while exercising, and options of places to exercise outside of rehab.  Flowsheet Row PULMONARY REHAB CHRONIC OBSTRUCTIVE PULMONARY DISEASE from 12/29/2022 in [facility] for Heart, Vascular, & Lung Health  Date 12/29/22  Educator EP  Instruction Review Code 1- Verbalizes Understanding       MET Level  Group instruction provided by PowerPoint, verbal discussion, and written material to support subject matter. Instructor reviews what METs are and how to increase METs.  Flowsheet Row PULMONARY REHAB CHRONIC OBSTRUCTIVE PULMONARY DISEASE from 11/24/2022 in [facility] for Heart, Vascular, & Lung Health  Date 11/24/22  Educator EP  Instruction Review Code 1- Verbalizes Understanding       Pulmonary Medications Verbally interactive group education provided by instructor with focus on inhaled medications and proper administration. Flowsheet Row PULMONARY REHAB CHRONIC OBSTRUCTIVE PULMONARY DISEASE from 12/22/2022 in [facility] for Heart, Vascular, & Lung Health  Date 12/22/22  Educator  RT  Instruction Review Code 1- Verbalizes Understanding       Anatomy and Physiology of the Respiratory System Group instruction provided by PowerPoint, verbal discussion, and written material to support subject matter. Instructor reviews respiratory cycle and anatomical components of the respiratory system and their functions. Instructor also reviews differences in obstructive and restrictive respiratory diseases with examples of each.  Flowsheet Row PULMONARY REHAB CHRONIC OBSTRUCTIVE PULMONARY DISEASE from 12/15/2022 in [facility] for Heart, Vascular, & Lung Health  Date 12/15/22  Educator RT  Instruction Review Code 1- Verbalizes Understanding       Oxygen Safety Group instruction provided by PowerPoint, verbal discussion, and written material to support subject matter. There is an overview of "What is Oxygen" and "Why do we need it".  Instructor also reviews how to create a safe environment for oxygen use, the importance of using oxygen as prescribed, and the risks of noncompliance. There is a brief discussion on traveling with oxygen and resources the patient may utilize. Flowsheet Row PULMONARY REHAB CHRONIC OBSTRUCTIVE PULMONARY DISEASE from 01/27/2022 in [facility] for Heart, Vascular, & Lung Health  Date 01/27/22  Educator RN  Instruction Review Code 1- Verbalizes Understanding       Oxygen Use Group instruction provided by PowerPoint, verbal discussion, and written material to discuss how supplemental oxygen is prescribed and different types of oxygen supply systems. Resources for more information are provided.  Flowsheet Row PULMONARY REHAB CHRONIC OBSTRUCTIVE PULMONARY DISEASE from 01/19/2023 in [facility] for Heart, Vascular, & Lung Health  Date 01/19/23  Educator RT  Instruction Review Code 1- Verbalizes Understanding       Breathing Techniques Group instruction that is supported by  demonstration and informational handouts. Instructor discusses the benefits of pursed lip and diaphragmatic breathing and detailed demonstration on how to perform both.  Flowsheet Row PULMONARY REHAB CHRONIC OBSTRUCTIVE PULMONARY DISEASE from 02/10/2022 in [facility] for Heart, Vascular, & Lung Health  Date 02/10/22  Educator EP  Instruction Review Code 1- Verbalizes Understanding  [Handout also provided]        Risk Factor Reduction Group instruction that is supported by a PowerPoint presentation. Instructor discusses  the definition of a risk factor, different risk factors for pulmonary disease, and how the heart and lungs work together. Flowsheet Row PULMONARY REHAB CHRONIC OBSTRUCTIVE PULMONARY DISEASE from 11/17/2022 in [facility] for Heart, Vascular, & Lung Health  Date 11/17/22  Educator EP  Instruction Review Code 1- Verbalizes Understanding       Pulmonary Diseases Group instruction provided by PowerPoint, verbal discussion, and written material to support subject matter. Instructor gives an overview of the different type of pulmonary diseases. There is also a discussion on risk factors and symptoms as well as ways to manage the diseases. Flowsheet Row PULMONARY REHAB CHRONIC OBSTRUCTIVE PULMONARY DISEASE from 12/08/2022 in [facility] for Heart, Vascular, & Lung Health  Date 12/08/22  Educator RT  Instruction Review Code 1- Verbalizes Understanding       Stress and Energy Conservation Group instruction provided by PowerPoint, verbal discussion, and written material to support subject matter. Instructor gives an overview of stress and the impact it can have on the body. Instructor also reviews ways to reduce stress. There is also a discussion on energy conservation and ways to conserve energy throughout the day. Flowsheet Row PULMONARY REHAB CHRONIC OBSTRUCTIVE PULMONARY DISEASE from 11/03/2022 in [facility] for Heart, Vascular, & [facility]  Date 11/03/22  Educator RN  Instruction Review Code 1- Verbalizes Understanding       Warning Signs and Symptoms Group instruction provided by PowerPoint, verbal discussion, and written material to support subject matter. Instructor reviews warning signs and symptoms of stroke, heart attack, cold and flu. Instructor also reviews ways to prevent the spread of infection. Flowsheet Row PULMONARY REHAB CHRONIC OBSTRUCTIVE PULMONARY DISEASE from 11/10/2022 in [facility] for [facility], V[facility], & [facility]  Date 11/10/22  Educator RN  Instruction Review Code 1- Verbalizes Understanding       Other Education Group or individual verbal, written, or video instructions that support the educational goals of the pulmonary rehab program. Flowsheet Row PULMONARY REHAB CHRONIC OBSTRUCTIVE PULMONARY DISEASE from 03/01/2022 in [facility] for [facility], Vascular, & Lung [facility]  Date 03/01/22  Educator EP  Instruction Review Code 1- Verbalizes Understanding        Knowledge Questionnaire Score:  Knowledge Questionnaire Score - 10/26/22 1135       Knowledge Questionnaire Score   Pre Score 17/18             Core Components/Risk Factors/Patient Goals at Admission:  Personal Goals and Risk Factors at Admission - 10/26/22 1105       Core Components/Risk Factors/Patient Goals on Admission    Weight Management Yes;Weight Loss   pt wants to lose 10lbs   Intervention Weight Management: Develop a combined nutrition and exercise program designed to reach desired caloric intake, while maintaining appropriate intake of nutrient and fiber, sodium and fats, and appropriate energy expenditure required for the weight goal.;Weight Management: Provide education and appropriate resources to help participant work on and attain dietary goals.;Weight Management/Obesity: Establish reasonable short term and  long term weight goals.;Obesity: Provide education and appropriate resources to help participant work on and attain dietary goals.    Expected Outcomes Short Term: Continue to assess and modify interventions until short term weight is achieved;Long Term: Adherence to nutrition and physical activity/exercise program aimed toward attainment of established weight goal;Weight Loss: Understanding of general recommendations for a balanced deficit meal plan, which promotes 1-2 lb weight loss per week and  includes a negative energy balance of [phone] kcal/d;Understanding recommendations for meals to include 15-35% energy as protein, 25-35% energy from fat, 35-60% energy from carbohydrates, less than 200mg  of dietary cholesterol, 20-35 gm of total fiber daily;Understanding of distribution of calorie intake throughout the day with the consumption of 4-5 meals/snacks    Improve shortness of breath with ADL's Yes    Intervention Provide education, individualized exercise plan and daily activity instruction to help decrease symptoms of SOB with activities of daily living.    Expected Outcomes Short Term: Improve cardiorespiratory fitness to achieve a reduction of symptoms when performing ADLs;Long Term: Be able to perform more ADLs without symptoms or delay the onset of symptoms    Stress Yes    Intervention Offer individual and/or small group education and counseling on adjustment to heart disease, stress management and health-related lifestyle change. Teach and support self-help strategies.;Refer participants experiencing significant psychosocial distress to appropriate mental health specialists for further evaluation and treatment. When possible, include family members and significant others in education/counseling sessions.    Expected Outcomes Short Term: Participant demonstrates changes in health-related behavior, relaxation and other stress management skills, ability to obtain effective social support, and  compliance with psychotropic medications if prescribed.;Long Term: Emotional wellbeing is indicated by absence of clinically significant psychosocial distress or social isolation.             Core Components/Risk Factors/Patient Goals Review:   Goals and Risk Factor Review     Row Name 10/28/22 1523 11/23/22 1137 12/16/22 0928 01/18/23 1539       Core Components/Risk Factors/Patient Goals Review   Personal Goals Review Weight Management/Obesity;Improve shortness of breath with ADL's;Develop more efficient breathing techniques such as purse lipped breathing and diaphragmatic breathing and practicing self-pacing with activity.;Stress Weight Management/Obesity;Improve shortness of breath with ADL's;Develop more efficient breathing techniques such as purse lipped breathing and diaphragmatic breathing and practicing self-pacing with activity. Weight Management/Obesity;Improve shortness of breath with ADL's;Develop more efficient breathing techniques such as purse lipped breathing and diaphragmatic breathing and practicing self-pacing with activity.;Stress Weight Management/Obesity;Improve shortness of breath with ADL's    Review Unable to assess. Louie is scheduled to start PR on 11/01/22 Goal progressing for weight loss. Latory is working with staff dietitian to achieve her weight loss goals. Goal progressing on improving shortness of breath with ADL's. Goal progressing on developing more efficient breathing techniques such as purse lipped breathing and diaphragmatic breathing; and practicing self-pacing with activity. increase knowledge or respiratory medications and ability to use respiratory devices properly. Scherri is requiring 2L of O2 to maintain sats >88%. She has also been able to transition from walking the track to walking on the treadmill. We will continue to monitor Taneasha's progress throughout the program. Goal progressing for weight loss. Goal progressing on improving shortness of breath  with ADL's. Goal progressing on developing more efficient breathing techniques such as purse lipped breathing and diaphragmatic breathing; and practicing self-pacing with activity. Goal met on increasing knowledge of respiratory medications and ability to use respiratory devices properly. Anabelle has demonstrated proper use of MDI to staff.  Goal progressing on decreasing stress. Audrieanna is currently working with a  therapist on her mental health issues. She has also started taking psychotropic meds. Eldra is requiring 2L of O2 to maintain sats >88% while exercising. We will continue to monitor Jaymi's progress throughout the program. Goal progressing for weight loss. Goal progressing on improving shortness of breath with ADL's. Goal met on developing more efficient breathing techniques such  as purse lipped breathing and diaphragmatic breathing; and practicing self-pacing with activity. Orlean is able to demonstrate purse lip breathing when she gets short of breath. Cynitha is also able to pace herself based on her rate of perceived exertion scale. Goal progressing on decreasing stress.  Lorrae is requiring 2L of O2 to maintain sats >88% while exercising. We will continue to monitor Janeka's progress throughout the program.    Expected Outcomes For Apple to lose weight, improve her SOB with ADLs, decrease stress, and develop more efficient breathing techniques such as purse lipped breathing and diaphragmatic breathing; and practicing self-pacing with activity For Carnisha to lose weight, improve her SOB with ADLs, decrease stress, and develop more efficient breathing techniques such as purse lipped breathing and diaphragmatic breathing; and practicing self-pacing with activity For Carrisa to lose weight, improve her SOB with ADLs, decrease stress, and develop more efficient breathing techniques such as purse lipped breathing and diaphragmatic breathing; and practicing self-pacing with activity For Jazsmine to lose  weight, improve her SOB with ADLs and decrease stress             Core Components/Risk Factors/Patient Goals at Discharge (Final Review):   Goals and Risk Factor Review - 01/18/23 1539       Core Components/Risk Factors/Patient Goals Review   Personal Goals Review Weight Management/Obesity;Improve shortness of breath with ADL's    Review Goal progressing for weight loss. Goal progressing on improving shortness of breath with ADL's. Goal met on developing more efficient breathing techniques such as purse lipped breathing and diaphragmatic breathing; and practicing self-pacing with activity. Aniiyah is able to demonstrate purse lip breathing when she gets short of breath. Viviann is also able to pace herself based on her rate of perceived exertion scale. Goal progressing on decreasing stress.  Erabella is requiring 2L of O2 to maintain sats >88% while exercising. We will continue to monitor Soila's progress throughout the program.    Expected Outcomes For Linsie to lose weight, improve her SOB with ADLs and decrease stress             ITP Comments: Pt is making expected progress toward Pulmonary Rehab goals after completing 17 session(s). Recommend continued exercise, life style modification, education, and utilization of breathing techniques to increase stamina and strength, while also decreasing shortness of breath with exertion.  Dr. Mechele Collin is Medical Director for Pulmonary Rehab at [facility].

## 2023-01-26 ENCOUNTER — Encounter (HOSPITAL_COMMUNITY)
Admission: RE | Admit: 2023-01-26 | Discharge: 2023-01-26 | Disposition: A | Discharge status: Home / Self Care | Payer: 59 | Source: Ambulatory Visit | Attending: Internal Medicine | Admitting: Internal Medicine

## 2023-01-26 DIAGNOSIS — J449 Chronic obstructive pulmonary disease, unspecified: Secondary | ICD-10-CM

## 2023-01-26 NOTE — Progress Notes (Signed)
Daily Session Note  Patient Details  Name: Jill Glenn MRN: [id number] Date of Birth: Apr 17, 1965 Referring Provider:   Doristine Devoid Pulmonary Rehab Walk Test from 10/26/2022 in [facility] for [facility], Vascular, & Lung [facility]  Referring Provider [name]       Encounter Date: 01/26/2023  Check In:  Session Check In - 01/26/23 1117       Check-In   Supervising physician immediately available to respond to emergencies [facility] MD immediately available    Physician(s) Neila Gear, NP    Location [facility]    Staff Present Essie Hart, RN, BSN;Ossie Yebra Katrinka Blazing, Zella Richer, MS, ACSM-CEP, Exercise Physiologist;Johnny Hale Bogus, MS, Exercise Physiologist    Virtual Visit No    Medication changes reported     No    Fall or balance concerns reported    No    Tobacco Cessation No Change    Warm-up and Cool-down Performed as group-led instruction    Resistance Training Performed Yes    VAD Patient? No    PAD/SET Patient? No      Pain Assessment   Currently in Pain? No/denies    Pain Score 0-No pain    Multiple Pain Sites No             Capillary Blood Glucose: No results found for this or any previous visit (from the past 24 hours).    Social History   Tobacco Use  Smoking Status Former   Current packs/day: 0.00   Average packs/day: 1 pack/day for 40.6 years (40.6 ttl pk-yrs)   Types: Cigarettes   Start date: 03/05/1981   Quit date: 10/14/2021   Years since quitting: 1.2  Smokeless Tobacco Never    Goals Met:  Proper associated with RPD/PD & O2 Sat Independence with exercise equipment Exercise tolerated well No report of concerns or symptoms today Strength training completed today  Goals Unmet:  Not Applicable  Comments: Service time is from 1008 to 1145.    Dr. Mechele Collin is Medical Director for Pulmonary Rehab at [facility].

## 2023-01-30 ENCOUNTER — Other Ambulatory Visit: Payer: 59

## 2023-01-31 ENCOUNTER — Encounter (HOSPITAL_COMMUNITY)
Admission: RE | Admit: 2023-01-31 | Discharge: 2023-01-31 | Disposition: A | Discharge status: Home / Self Care | Payer: 59 | Source: Ambulatory Visit | Attending: Internal Medicine | Admitting: Internal Medicine

## 2023-01-31 DIAGNOSIS — J449 Chronic obstructive pulmonary disease, unspecified: Secondary | ICD-10-CM | POA: Diagnosis not present

## 2023-01-31 NOTE — Progress Notes (Signed)
Daily Session Note  Patient Details  Name: Jill Glenn MRN: [id number] Date of Birth: [birth year] Referring Provider:   Doristine Devoid Pulmonary Rehab Walk Test from 10/26/2022 in [facility] for Heart, Vascular, & Lung [facility]  Referring Provider [name]       Encounter Date: 01/31/2023  Check In:  Session Check In - 01/31/23 1035       Check-In   Supervising physician immediately available to respond to emergencies [facility] MD immediately available    Physician(s) Tereso Newcomer, PA    Location [facility]    Staff Present Essie Hart, RN, BSN;Luree Palla Katrinka Blazing, Zella Richer, MS, ACSM-CEP, Exercise Physiologist;Olinty Peggye Pitt, MS, ACSM-CEP, Exercise Physiologist    Virtual Visit No    Medication changes reported     No    Fall or balance concerns reported    No    Tobacco Cessation No Change    Warm-up and Cool-down Performed as group-led instruction    Resistance Training Performed Yes    VAD Patient? No    PAD/SET Patient? No      Pain Assessment   Currently in Pain? No/denies    Pain Score 0-No pain    Multiple Pain Sites No             Capillary Blood Glucose: No results found for this or any previous visit (from the past 24 hours).    Social History   Tobacco Use  Smoking Status Former   Current packs/day: 0.00   Average packs/day: 1 pack/day for 40.6 years (40.6 ttl pk-yrs)   Types: Cigarettes   Start date: 03/05/1981   Quit date: 10/14/2021   Years since quitting: 1.2  Smokeless Tobacco Never    Goals Met:  Proper associated with RPD/PD & O2 Sat Independence with exercise equipment Exercise tolerated well No report of concerns or symptoms today Strength training completed today  Goals Unmet:  Not Applicable  Comments: Service time is from 1004 to 1130.    Dr. Mechele Collin is Medical Director for Pulmonary Rehab at [facility].

## 2023-02-02 ENCOUNTER — Encounter (HOSPITAL_COMMUNITY)
Admission: RE | Admit: 2023-02-02 | Discharge: 2023-02-02 | Disposition: A | Discharge status: Home / Self Care | Payer: 59 | Source: Ambulatory Visit | Attending: Internal Medicine | Admitting: Internal Medicine

## 2023-02-02 VITALS — Wt 177.5 lb

## 2023-02-02 DIAGNOSIS — J449 Chronic obstructive pulmonary disease, unspecified: Secondary | ICD-10-CM

## 2023-02-02 DIAGNOSIS — J432 Centrilobular emphysema: Secondary | ICD-10-CM | POA: Diagnosis not present

## 2023-02-02 DIAGNOSIS — R0602 Shortness of breath: Secondary | ICD-10-CM | POA: Diagnosis not present

## 2023-02-02 NOTE — Progress Notes (Signed)
Daily Session Note  Patient Details  Name: Jill Glenn MRN: [id number] Date of Birth: 12-Nov-1965 Referring Provider:   Doristine Devoid Pulmonary Rehab Walk Test from 10/26/2022 in [facility] for Heart, Vascular, & Lung Health  Referring Provider [name]       Encounter Date: 02/02/2023  Check In:  Session Check In - 02/02/23 1047       Check-In   Supervising physician immediately available to respond to emergencies [facility] MD immediately available    Physician(s) Edd Fabian, NP    Location [facility]    Staff Present Essie Hart, RN, BSN;Casey Smith, Zella Richer, MS, ACSM-CEP, Exercise Physiologist;Jetta Dan Humphreys BS, ACSM-CEP, Exercise Physiologist;Ezzie Senat Idelle Crouch BS, ACSM-CEP, Exercise Physiologist    Virtual Visit No    Medication changes reported     No    Fall or balance concerns reported    No    Tobacco Cessation No Change    Warm-up and Cool-down Performed as group-led instruction    Resistance Training Performed Yes    VAD Patient? No    PAD/SET Patient? No      Pain Assessment   Currently in Pain? No/denies    Multiple Pain Sites No             Capillary Blood Glucose: No results found for this or any previous visit (from the past 24 hours).    Social History   Tobacco Use  Smoking Status Former   Current packs/day: 0.00   Average packs/day: 1 pack/day for 40.6 years (40.6 ttl pk-yrs)   Types: Cigarettes   Start date: 03/05/1981   Quit date: 10/14/2021   Years since quitting: 1.3  Smokeless Tobacco Never    Goals Met:  Independence with exercise equipment Exercise tolerated well No report of concerns or symptoms today Strength training completed today  Goals Unmet:  Not Applicable  Comments: Service time is from 1009 to 1136.    Dr. Mechele Collin is Medical Director for Pulmonary Rehab at [facility].

## 2023-02-03 ENCOUNTER — Encounter: Payer: Self-pay | Admitting: Internal Medicine

## 2023-02-03 ENCOUNTER — Telehealth: Payer: Self-pay | Admitting: Internal Medicine

## 2023-02-03 NOTE — Telephone Encounter (Signed)
Pt states she was exposed to the Flu by her grandson. Pt grandson tested pos for flu a. I advised pt that she does not need to start taking any medications and she needs to away from her grandson. I advised pt that she needs to wear a mask if she needs to leave home to avoid further exposure, and to be on the lookout for any fevers, headaches, sinus/chest congestion. Pt verbalized understanding. NFN

## 2023-02-03 NOTE — Telephone Encounter (Signed)
Patient was exposed to grandchild yesterday for 5 hours. He has tested positive for flu. She is not having any symptoms as of now, she is exhausted, but wants to know if she should do anything for prevention. Please advise.

## 2023-02-03 NOTE — Telephone Encounter (Signed)
Patient has been exposed to the flu by her grandson and is wondering if there is anything she should do or take to prevent from getting sick. Please advise. [phone]

## 2023-02-05 ENCOUNTER — Other Ambulatory Visit: Payer: Self-pay | Admitting: Internal Medicine

## 2023-02-05 DIAGNOSIS — K2100 Gastro-esophageal reflux disease with esophagitis, without bleeding: Secondary | ICD-10-CM

## 2023-02-07 ENCOUNTER — Encounter (HOSPITAL_COMMUNITY)
Admission: RE | Admit: 2023-02-07 | Discharge: 2023-02-07 | Disposition: A | Discharge status: Home / Self Care | Payer: 59 | Source: Ambulatory Visit | Attending: Internal Medicine | Admitting: Internal Medicine

## 2023-02-07 DIAGNOSIS — J449 Chronic obstructive pulmonary disease, unspecified: Secondary | ICD-10-CM

## 2023-02-09 ENCOUNTER — Encounter (HOSPITAL_COMMUNITY): Payer: 59
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
## 2023-02-14 ENCOUNTER — Encounter: Payer: Self-pay | Admitting: Dermatology

## 2023-02-14 ENCOUNTER — Encounter (HOSPITAL_COMMUNITY)
Admission: RE | Admit: 2023-02-14 | Discharge: 2023-02-14 | Disposition: A | Discharge status: Home / Self Care | Payer: 59 | Source: Ambulatory Visit | Attending: Internal Medicine | Admitting: Internal Medicine

## 2023-02-14 ENCOUNTER — Ambulatory Visit: Payer: 59 | Admitting: Dermatology

## 2023-02-14 VITALS — BP 120/80 | HR 73

## 2023-02-14 DIAGNOSIS — J449 Chronic obstructive pulmonary disease, unspecified: Secondary | ICD-10-CM | POA: Diagnosis not present

## 2023-02-14 DIAGNOSIS — D225 Melanocytic nevi of trunk: Secondary | ICD-10-CM | POA: Diagnosis not present

## 2023-02-14 DIAGNOSIS — D492 Neoplasm of unspecified behavior of bone, soft tissue, and skin: Secondary | ICD-10-CM | POA: Diagnosis not present

## 2023-02-14 DIAGNOSIS — Z808 Family history of malignant neoplasm of other organs or systems: Secondary | ICD-10-CM | POA: Diagnosis not present

## 2023-02-14 DIAGNOSIS — D485 Neoplasm of uncertain behavior of skin: Secondary | ICD-10-CM

## 2023-02-14 DIAGNOSIS — D229 Melanocytic nevi, unspecified: Secondary | ICD-10-CM

## 2023-02-14 NOTE — Progress Notes (Signed)
Daily Session Note  Patient Details  Name: Jill Glenn MRN: [id number] Date of Birth: 1965/01/09 Referring Provider:   Doristine Devoid Pulmonary Rehab Walk Test from 10/26/2022 in [facility]  Referring Provider [name]       Encounter Date: 02/14/2023  Check In:  Session Check In - 02/14/23 1028       Check-In   Supervising physician immediately available to respond to emergencies [facility] MD immediately available    Physician(s) Eligha Bridegroom    Location [facility]    Staff Present Essie Hart, RN, BSN;Masayuki Sakai, Zella Richer, MS, ACSM-CEP, Exercise Physiologist;Jetta Dan Humphreys BS, ACSM-CEP, Exercise Physiologist;Randi Idelle Crouch BS, ACSM-CEP, Exercise Physiologist    Virtual Visit No    Medication changes reported     No    Fall or balance concerns reported    No    Tobacco Cessation No Change    Warm-up and Cool-down Performed as group-led instruction    Resistance Training Performed Yes    VAD Patient? No    PAD/SET Patient? No      Pain Assessment   Currently in Pain? No/denies             Capillary Blood Glucose: No results found for this or any previous visit (from the past 24 hours).    Social History   Tobacco Use  Smoking Status Former   Current packs/day: 0.00   Average packs/day: 1 pack/day for 40.6 years (40.6 ttl pk-yrs)   Types: Cigarettes   Start date: 03/05/1981   Quit date: 10/14/2021   Years since quitting: 1.3  Smokeless Tobacco Never    Goals Met:  Proper associated with RPD/PD & O2 Sat Independence with exercise equipment Exercise tolerated well No report of concerns or symptoms today Strength training completed today  Goals Unmet:  Not Applicable  Comments: Service time is from 1006 to 1136.    Dr. Mechele Collin is Medical Director for Pulmonary Rehab at [facility].

## 2023-02-14 NOTE — Patient Instructions (Addendum)

## 2023-02-14 NOTE — Progress Notes (Signed)
   New Patient Visit   Subjective  Jill Glenn is a 58 y.o. female who presents for the following: New Pt - Nevus  Patient states she has mole located at the back that she would like to have examined. Patient reports the areas have been there for 7 or 8 years. She reports the areas are not bothersome.Patient rates irritation 0 out of 10 but at times feel dry and scaly. She states that the areas have not spread. Patient reports she has previously been treated for these areas. Patient reports Hx of bx. Patient reports family history of skin cancer(s)(Father - melanoma, Paternal Aunt - melanoma)  The patient has spots, moles and lesions to be evaluated, some may be new or changing and the patient may have concern these could be cancer.   The following portions of the chart were reviewed this encounter and updated as appropriate: medications, allergies, medical history  Review of Systems:  No other skin or systemic complaints except as noted in HPI or Assessment and Plan.  Objective  Well appearing patient in no apparent distress; mood and affect are within normal limits.   A focused examination was performed of the following areas:   Relevant exam findings are noted in the Assessment and Plan.       Mid Back 7mm irregular brown macule  Assessment & Plan   Suspicious Mole on Central Lower Back Assessment: Patient presents with a 7mm irregular mole on the central lower back, previously identified by another dermatologist. The mole has become misshapen and scaly, raising suspicion for dysplastic nevus or potential skin cancer.  Plan:  Performed epidermal shave removal of the 7mm irregular mole for histopathological evaluation. Instructed patient to apply Aquaphor and keep the area bandaged. Anticipate results within one week.   Provided samples of Aquaphor to the patient.  Recommended full skin cancer screening in the future, particularly during summer.  MELANOCYTIC  NEVI Exam: Tan-brown and/or pink-flesh-colored symmetric macules and papules on back  Treatment Plan: Benign appearing on exam today. Recommend observation. Call clinic for new or changing moles. Recommend daily use of broad spectrum spf 30+ sunscreen to sun-exposed areas.   NEOPLASM OF UNCERTAIN BEHAVIOR OF SKIN Mid Back Epidermal / dermal shaving  Informed consent: discussed and consent obtained   Timeout: patient name, date of birth, surgical site, and procedure verified   Procedure prep:  Patient was prepped and draped in usual sterile fashion Prep type:  Isopropyl alcohol Anesthesia: the lesion was anesthetized in a standard fashion   Anesthetic:  1% lidocaine w/ epinephrine 1-100,000 buffered w/ 8.4% NaHCO3 Instrument used: flexible razor blade   Hemostasis achieved with: pressure, aluminum chloride and electrodesiccation   Outcome: patient tolerated procedure well   Post-procedure details: sterile dressing applied and wound care instructions given   Dressing type: bandage and petrolatum   Specimen 1 - Surgical pathology Differential Diagnosis: r/o DPN  Check Margins: yes  Return for TBSE.    Documentation: I have reviewed the above documentation for accuracy and completeness, and I agree with   the above.   I, Shirron Marcha Solders, CMA, am acting as scribe for [employer], DO.   Langston Reusing, DO

## 2023-02-15 ENCOUNTER — Ambulatory Visit (HOSPITAL_BASED_OUTPATIENT_CLINIC_OR_DEPARTMENT_OTHER): Payer: 59 | Admitting: Student

## 2023-02-15 ENCOUNTER — Encounter (HOSPITAL_COMMUNITY): Payer: Self-pay | Admitting: Student

## 2023-02-15 ENCOUNTER — Ambulatory Visit (INDEPENDENT_AMBULATORY_CARE_PROVIDER_SITE_OTHER): Payer: 59 | Admitting: Licensed Clinical Social Worker

## 2023-02-15 DIAGNOSIS — F4310 Post-traumatic stress disorder, unspecified: Secondary | ICD-10-CM

## 2023-02-15 DIAGNOSIS — F411 Generalized anxiety disorder: Secondary | ICD-10-CM | POA: Diagnosis not present

## 2023-02-15 DIAGNOSIS — F322 Major depressive disorder, single episode, severe without psychotic features: Secondary | ICD-10-CM | POA: Diagnosis not present

## 2023-02-15 MED ORDER — HYDROXYZINE HCL 25 MG PO TABS: 25.0000 mg | ORAL_TABLET | Freq: Every evening | ORAL | 0 refills | Status: DC | PRN

## 2023-02-15 MED ORDER — BUSPIRONE HCL 15 MG PO TABS: 15.0000 mg | ORAL_TABLET | Freq: Two times a day (BID) | ORAL | 0 refills | Status: DC

## 2023-02-15 MED ORDER — CITALOPRAM HYDROBROMIDE 10 MG PO TABS: 10.0000 mg | ORAL_TABLET | Freq: Every day | ORAL | 0 refills | Status: DC

## 2023-02-15 NOTE — Progress Notes (Unsigned)
THERAPIST PROGRESS NOTE   Session Date: 02/15/2023  Session Time: 1300 - 1405  Participation Level: Active  Behavioral Response: Well GroomedAlertAnxious and Depressed  Type of Therapy: Individual Therapy  Treatment Goals addressed:  Active    Anxiety    LTG: "Work on communicating my needs better"        Depression    LTG: Reduce frequency, intensity, and duration of depression symptoms so that daily functioning is improved      LTG: Increase coping skills to manage depression and improve ability to perform daily activities      LTG: "I just want to be happy"        ProgressTowards Goals: Progressing  Interventions: CBT, Motivational Interviewing, and Supportive  Summary: Jill Glenn is a 58 y.o. female with past psych history of MDD, GAD, and PTSD, presenting for follow-up therapy session in efforts to improve management of depressive and anxious symptoms. Patient actively engaged in session, presenting in depressed and mildly anxious moods and congruent affect throughout.   - Pt openly detailed recent events, sharing of having noted improved moods and reduced depressive sxs, detailing having ran out of hydroxyzine earlier in the month, having not considered connected with med man provider due to upcoming appt, having received additional updates surrounding progression of disease and developing emphysema, having determined as to not proceed with lung reduction procedure, and continued challenges navigating presenting medical stressors with husband and youngest daughter due to their avoidance when pt attempts to discuss prognosis and pressing concerns. - Re-administered PHQ-9 and GAD-7, engaging further in review of scores, processing variances between previous scores, reduction of varying sxs, and improved individual management of sxs. - Further explored improvements surrounding sleep and beginning to experience vivid dreams about past hx and trauma pt has suppressed for  decades. - Explored efforts to beginning journaling to track moods and alleviate anxiety and stress by noting thoughts and worries to be able to revisit at a later day/time.  Patient responded well to interventions. Patient continues to meet criteria for MDD, GAD, and PTSD. Patient will continue to benefit from engagement in outpatient therapy due to being the least restrictive service to meet presenting needs.      02/15/2023    3:12 PM 01/18/2023    1:19 PM 11/28/2022    1:11 PM 11/28/2022    9:06 AM 10/26/2022   11:30 AM  Depression screen PHQ 2/9  Decreased Interest 1 2 0 3 2  Down, Depressed, Hopeless 1 1 0 2 2  PHQ - 2 Score 2 3 0 5 4  Altered sleeping 3 3  3 3   Tired, decreased energy 1 2  3 3   Change in appetite 0 0  1 3  Feeling bad or failure about yourself  1 3  3 3   Trouble concentrating 2 0  0 0  Moving slowly or fidgety/restless 0 0  1 0  Suicidal thoughts 0 0  0 0  PHQ-9 Score 9 11  16 16   Difficult doing work/chores Somewhat difficult Somewhat difficult  Very difficult Somewhat difficult      02/15/2023    3:06 PM 01/18/2023    1:17 PM 11/28/2022    9:04 AM  GAD 7 : Generalized Anxiety Score  Nervous, Anxious, on Edge 1 2 2   Control/stop worrying 3 3 3   Worry too much - different things 3 3 3   Trouble relaxing 1 3 3   Restless 3 2 2   Easily annoyed or irritable 1 3 3   Afraid -  awful might happen 3 3 3   Total GAD 7 Score 15 19 19   Anxiety Difficulty Somewhat difficult Somewhat difficult Very difficult     Suicidal/Homicidal: Nowithout intent/plan  Therapist Response: Clinician utilized CBT, MI, and solution focused interventions to address pt's identified presenting problems and depressive and anxious sxs. Engaged pt in check-in, assessing presenting moods and affect.  - Actively listened to pt's recounts of recent events, validating feelings, eliciting thoughts and feelings surrounding events, medical condition progressions and prognosis, and challenges  navigating presenting stress with family.  - Re-administered PHQ-9 and GAD-7, engaging further in review of scores, downward trends, and presenting sxs. - Began exploration of thoughts and feelings surrounding recent experience in increased dreams and how trauma proves to impact brain functioning in relation to thoughts and perspectives. - Encouraged pt to begin journaling to support in documenting thoughts and feelings in efforts to reduce anxiety and support in increasing healthy sleep.   Clinician reassessed severity of depressive and anxious sxs, and presence of any safety concerns. Therapist provided support and empathy to patient during session.  Plan: Return again in 1 week.  Diagnosis:  Encounter Diagnoses  Name Primary?   Generalized anxiety disorder Yes   Severe major depression without psychotic features (HCC)    PTSD (post-traumatic stress disorder)      Collaboration of Care: Psychiatrist AEB provider notes available in EHR.  Patient/Guardian was advised Release of Information must be obtained prior to any record release in order to collaborate their care with an outside provider. Patient/Guardian was advised if they have not already done so to contact the registration department to sign all necessary forms in order for [place] to release information regarding their care.   Consent: Patient/Guardian gives verbal consent for treatment and assignment of benefits for services provided during this visit. Patient/Guardian expressed understanding and agreed to proceed.   Leisa Lenz, MSW, LCSW 02/15/2023,  3:23 PM

## 2023-02-16 ENCOUNTER — Encounter (HOSPITAL_COMMUNITY)
Admission: RE | Admit: 2023-02-16 | Discharge: 2023-02-16 | Disposition: A | Discharge status: Home / Self Care | Payer: 59 | Source: Ambulatory Visit | Attending: Internal Medicine | Admitting: Internal Medicine

## 2023-02-16 DIAGNOSIS — J449 Chronic obstructive pulmonary disease, unspecified: Secondary | ICD-10-CM | POA: Diagnosis not present

## 2023-02-16 LAB — SURGICAL PATHOLOGY

## 2023-02-16 NOTE — Addendum Note (Signed)
Addended by: Everlena Cooper on: 02/16/2023 08:00 AM   Modules accepted: Level of Service

## 2023-02-16 NOTE — Progress Notes (Signed)
Daily Session Note  Patient Details  Name: Jill Glenn MRN: [id number] Date of Birth: 1965/05/03 Referring Provider:   Doristine Devoid Pulmonary Rehab Walk Test from 10/26/2022 in [facility]  Referring Provider [name]       Encounter Date: 02/16/2023  Check In:  Session Check In - 02/16/23 1026       Check-In   Supervising physician immediately available to respond to emergencies [facility] MD immediately available    Physician(s) Rise Paganini, NP    Location [facility]    Staff Present Essie Hart, RN, BSN;Casey Katrinka Blazing, Zella Richer, MS, ACSM-CEP, Exercise Physiologist;Johnny Hale Bogus, MS, Exercise Physiologist    Virtual Visit No    Medication changes reported     No    Fall or balance concerns reported    No    Tobacco Cessation No Change    Warm-up and Cool-down Performed as group-led instruction    Resistance Training Performed Yes    VAD Patient? No    PAD/SET Patient? No      Pain Assessment   Currently in Pain? No/denies    Multiple Pain Sites No             Capillary Blood Glucose: No results found for this or any previous visit (from the past 24 hours).    Social History   Tobacco Use  Smoking Status Former   Current packs/day: 0.00   Average packs/day: 1 pack/day for 40.6 years (40.6 ttl pk-yrs)   Types: Cigarettes   Start date: 03/05/1981   Quit date: 10/14/2021   Years since quitting: 1.3  Smokeless Tobacco Never    Goals Met:  Proper associated with RPD/PD & O2 Sat Independence with exercise equipment Exercise tolerated well No report of concerns or symptoms today Strength training completed today  Goals Unmet:  Not Applicable  Comments: Service time is from 1006 to 1134.    Dr. Mechele Collin is Medical Director for Pulmonary Rehab at [facility].

## 2023-02-21 ENCOUNTER — Ambulatory Visit (HOSPITAL_COMMUNITY): Payer: 59 | Admitting: Licensed Clinical Social Worker

## 2023-02-21 ENCOUNTER — Encounter (HOSPITAL_COMMUNITY)
Admission: RE | Admit: 2023-02-21 | Discharge: 2023-02-21 | Disposition: A | Discharge status: Home / Self Care | Payer: 59 | Source: Ambulatory Visit | Attending: Internal Medicine | Admitting: Internal Medicine

## 2023-02-21 VITALS — Wt 177.9 lb

## 2023-02-21 DIAGNOSIS — J449 Chronic obstructive pulmonary disease, unspecified: Secondary | ICD-10-CM | POA: Diagnosis not present

## 2023-02-21 NOTE — Progress Notes (Signed)
Daily Session Note  Patient Details  Name: QUINTANA CANELO MRN: [id number] Date of Birth: 12/09/1965 Referring Provider:   Doristine Devoid Pulmonary Rehab Walk Test from 10/26/2022 in [facility]  Referring Provider [name]       Encounter Date: 02/21/2023  Check In:  Session Check In - 02/21/23 1025       Check-In   Supervising physician immediately available to respond to emergencies [facility] MD immediately available    Physician(s) Carlyon Shadow, NP    Location [facility]    Staff Present Essie Hart, RN, BSN;Casey Katrinka Blazing, Zella Richer, MS, ACSM-CEP, Exercise Physiologist;Randi Dionisio Paschal, ACSM-CEP, Exercise Physiologist;Olinty Peggye Pitt, MS, ACSM-CEP, Exercise Physiologist    Virtual Visit No    Medication changes reported     No    Fall or balance concerns reported    No    Tobacco Cessation No Change    Warm-up and Cool-down Performed as group-led instruction    Resistance Training Performed Yes    VAD Patient? No    PAD/SET Patient? No      Pain Assessment   Currently in Pain? No/denies    Multiple Pain Sites No             Capillary Blood Glucose: No results found for this or any previous visit (from the past 24 hours).   Exercise Prescription Changes - 02/21/23 1200       Response to Exercise   Blood Pressure (Admit) 100/60    Blood Pressure (Exercise) 142/82    Blood Pressure (Exit) 92/62    Heart Rate (Admit) 76 bpm    Heart Rate (Exercise) 110 bpm    Heart Rate (Exit) 90 bpm    Oxygen Saturation (Admit) 97 %    Oxygen Saturation (Exercise) 92 %    Oxygen Saturation (Exit) 97 %    Rating of Perceived Exertion (Exercise) 12.5    Perceived Dyspnea (Exercise) 2    Duration Continue with 30 min of aerobic exercise without signs/symptoms of physical distress.    Intensity THRR unchanged      Progression   Progression Continue to progress workloads to maintain intensity  without signs/symptoms of physical distress.      Resistance Training   Training Prescription Yes    Weight blue bands    Reps 10-15    Time 10 Minutes      Interval Training   Interval Training Yes    Equipment Treadmill      Oxygen   Oxygen Continuous    Liters 2      Treadmill   MPH 2    Grade 3    Minutes 15    METs 3.36      Recumbant Elliptical   Level 3    RPM 58    Watts 78    Minutes 15    METs 3.6      Oxygen   Maintain Oxygen Saturation 88% or higher             Social History   Tobacco Use  Smoking Status Former   Current packs/day: 0.00   Average packs/day: 1 pack/day for 40.6 years (40.6 ttl pk-yrs)   Types: Cigarettes   Start date: 03/05/1981   Quit date: 10/14/2021   Years since quitting: 1.3  Smokeless Tobacco Never    Goals Met:  Independence with exercise equipment Exercise tolerated well No report of concerns or symptoms today Strength  training completed today  Goals Unmet:  Not Applicable  Comments: Service time is from 1004 to 1128    Dr. Mechele Collin is Medical Director for Pulmonary Rehab at [facility].

## 2023-02-22 NOTE — Progress Notes (Signed)
Pulmonary Individual Treatment Plan  Patient Details  Name: Jill Glenn MRN: [id number] Date of Birth: [birth year] Referring Provider:   Doristine Devoid Pulmonary Rehab Walk Test from 10/26/2022 in [facility]  Referring Provider [name]       Initial Encounter Date:  Flowsheet Row Pulmonary Rehab Walk Test from 10/26/2022 in [facility]  Date 10/26/22       Visit Diagnosis: Stage 3 severe COPD by GOLD classification (HCC)  Patient's Home Medications on Admission:   Current Outpatient Medications:    acetaminophen (TYLENOL) 325 MG tablet, Take 2 tablets (650 mg total) by mouth every 6 (six) hours as needed for moderate pain, mild pain or headache., Disp: , Rfl:    albuterol (PROVENTIL) (2.5 MG/3ML) 0.083% nebulizer solution, Take 3 mLs (2.5 mg total) by nebulization every 6 (six) hours as needed for wheezing or shortness of breath., Disp: 75 mL, Rfl: 12   albuterol (VENTOLIN HFA) 108 (90 Base) MCG/ACT inhaler, Inhale 2 puffs into the lungs every 6 (six) hours as needed for wheezing or shortness of breath., Disp: 18 g, Rfl: 3   Ascorbic Acid (VITAMIN C GUMMIES PO), Take 500 mg by mouth daily., Disp: , Rfl:    busPIRone (BUSPAR) 15 MG tablet, Take 1 tablet (15 mg total) by mouth 2 (two) times daily., Disp: 180 tablet, Rfl: 0   citalopram (CELEXA) 10 MG tablet, Take 1 tablet (10 mg total) by mouth daily., Disp: 90 tablet, Rfl: 0   hydrOXYzine (ATARAX) 25 MG tablet, Take 1 tablet (25 mg total) by mouth at bedtime as needed (sleep)., Disp: 90 tablet, Rfl: 0   levocetirizine (XYZAL) 5 MG tablet, Take 1 tablet (5 mg total) by mouth every evening., Disp: 30 tablet, Rfl: 11   montelukast (SINGULAIR) 10 MG tablet, Take 1 tablet (10 mg total) by mouth at bedtime., Disp: 30 tablet, Rfl: 11   Multiple Vitamin (MULTIVITAMIN ADULT) TABS, Take 1 tablet by mouth daily with  breakfast., Disp: , Rfl:    rizatriptan (MAXALT) 10 MG tablet, Take 1 tablet (10 mg total) by mouth once as needed for migraine. May repeat in 2 hours if needed, Disp: 30 tablet, Rfl: 2   TRELEGY ELLIPTA 200-62.5-25 MCG/ACT AEPB, Inhale 1 Inhalation into the lungs daily., Disp: 1 each, Rfl: 5   Ubrogepant (UBRELVY) 100 MG TABS, Take one tablet onset of Migraine May take another tablet 2 hours later, Don't exceed two tablets daily, Disp: 16 tablet, Rfl: 11   varenicline (CHANTIX) 1 MG tablet, Take 1 mg by mouth daily., Disp: , Rfl:   Past Medical History: Past Medical History:  Diagnosis Date   Allergy    seasonal   Anxiety    Asthma    COPD (chronic obstructive pulmonary disease) (HCC)    Depression    Emphysema of lung (HCC)    PTSD (post-traumatic stress disorder)     Tobacco Use: Social History   Tobacco Use  Smoking Status Former   Current packs/day: 0.00   Average packs/day: 1 pack/day for 40.6 years (40.6 ttl pk-yrs)   Types: Cigarettes   Start date: 03/05/1981   Quit date: 10/14/2021   Years since quitting: 1.3  Smokeless Tobacco Never    Labs: Review Flowsheet  More data may exist      Latest Ref Rng & Units 11/02/2020 12/21/2020 03/24/2022 04/12/2022 11/16/2022  Labs for ITP Cardiac and Pulmonary Rehab  Cholestrol  0 - 200 mg/dL - 409  - 811  -  LDL (calc) 0 - 99 mg/dL - 91  - 914  -  HDL-C >78.29 mg/dL - 56.21  - 30.86  -  Trlycerides 0.0 - 149.0 mg/dL - 57.8  - 469.6  -  Hemoglobin A1c 4.6 - 6.5 % - - 6.0  - 5.9   Bicarbonate 20.0 - 28.0 mmol/L 22.2  - - - -  TCO2 22 - 32 mmol/L 23  - - - -  Acid-base deficit 0.0 - 2.0 mmol/L 3.0  - - - -  O2 Saturation % 90.0  - - - -    Capillary Blood Glucose: Lab Results  Component Value Date   GLUCAP 73 03/26/2022   GLUCAP 148 (H) 03/25/2022   GLUCAP 166 (H) 03/25/2022   GLUCAP 88 03/25/2022   GLUCAP 84 03/25/2022     Pulmonary Assessment Scores:  Pulmonary Assessment Scores     Row Name 10/26/22 1135          ADL UCSD   ADL Phase Entry     SOB Score total 54       CAT Score   CAT Score 16       mMRC Score   mMRC Score 4             UCSD: Self-administered rating of dyspnea associated with activities of daily living (ADLs) 6-point scale (0 = "not at all" to 5 = "maximal or unable to do because of breathlessness")  Scoring Scores range from 0 to 120.  Minimally important difference is 5 units  CAT: CAT can identify the health impairment of COPD patients and is better correlated with disease progression.  CAT has a scoring range of zero to 40. The CAT score is classified into four groups of low (less than 10), medium (10 - 20), high (21-30) and very high (31-40) based on the impact level of disease on health status. A CAT score over 10 suggests significant symptoms.  A worsening CAT score could be explained by an exacerbation, poor medication adherence, poor inhaler technique, or progression of COPD or comorbid conditions.  CAT MCID is 2 points  mMRC: mMRC (Modified Medical Research Council) Dyspnea Scale is used to assess the degree of baseline functional disability in patients of respiratory disease due to dyspnea. No minimal important difference is established. A decrease in score of 1 point or greater is considered a positive change.   Pulmonary Function Assessment:  Pulmonary Function Assessment - 10/26/22 1109       Breath   Bilateral Breath Sounds Clear;Decreased    Shortness of Breath Limiting activity;Panic with Shortness of Breath   only panics if she doesn't have her inhaler with her            Exercise Target Goals: Exercise Program Goal: Individual exercise prescription set using results from initial 6 min walk test and THRR while considering  patient's activity barriers and safety.   Exercise Prescription Goal: Initial exercise prescription builds to 30-45 minutes a day of aerobic activity, 2-3 days per week.  Home exercise guidelines will be given to patient  during program as part of exercise prescription that the participant will acknowledge.  Activity Barriers & Risk Stratification:  Activity Barriers & Cardiac Risk Stratification - 10/26/22 1107       Activity Barriers & Cardiac Risk Stratification   Activity Barriers Deconditioning;Muscular Weakness;Shortness of Breath;Arthritis;Back Problems    Cardiac Risk Stratification Moderate  6 Minute Walk:  6 Minute Walk     Row Name 10/26/22 1201         6 Minute Walk   Phase Initial     Distance 1125 feet     Walk Time 6 minutes     # of Rest Breaks 1  2:12-3:00     MPH 2.13     METS 2.98     RPE 14.5     Perceived Dyspnea  3     VO2 Peak 10.43     Symptoms No     Resting HR 74 bpm     Resting BP 106/70     Resting Oxygen Saturation  95 %     Exercise Oxygen Saturation  during 6 min walk 84 %     Max Ex. HR 107 bpm     Max Ex. BP 110/70     2 Minute Post BP 106/60       Interval HR   1 Minute HR 83     2 Minute HR 85     3 Minute HR 90     4 Minute HR 89     5 Minute HR 107     6 Minute HR 98     2 Minute Post HR 76     Interval Heart Rate? Yes       Interval Oxygen   Interval Oxygen? Yes     Baseline Oxygen Saturation % 95 %     1 Minute Oxygen Saturation % 97 %     1 Minute Liters of Oxygen 0 L     2 Minute Oxygen Saturation % 89 %     2 Minute Liters of Oxygen 0 L     3 Minute Oxygen Saturation % 94 %  2:12 84%     3 Minute Liters of Oxygen 0 L  increased to 2L     4 Minute Oxygen Saturation % 92 %     4 Minute Liters of Oxygen 2 L     5 Minute Oxygen Saturation % 92 %     5 Minute Liters of Oxygen 2 L     6 Minute Oxygen Saturation % 89 %     6 Minute Liters of Oxygen 2 L     2 Minute Post Oxygen Saturation % 97 %     2 Minute Post Liters of Oxygen 2 L              Oxygen Initial Assessment:  Oxygen Initial Assessment - 10/26/22 1108       Home Oxygen   Home Oxygen Device Home Concentrator;Portable Concentrator;E-Tanks    Sleep  Oxygen Prescription Continuous    Liters per minute 2    Home Exercise Oxygen Prescription Pulsed    Liters per minute 2    Home Resting Oxygen Prescription None    Compliance with Home Oxygen Use Yes      Initial 6 min Walk   Oxygen Used Continuous    Liters per minute 2      Program Oxygen Prescription   Program Oxygen Prescription Continuous    Liters per minute 2      Intervention   Short Term Goals To learn and exhibit compliance with exercise, home and travel O2 prescription;To learn and understand importance of monitoring SPO2 with pulse oximeter and demonstrate accurate use of the pulse oximeter.;To learn and understand importance of maintaining oxygen saturations>88%;To learn and demonstrate proper pursed lip  breathing techniques or other breathing techniques. ;To learn and demonstrate proper use of respiratory medications    Long  Term Goals Exhibits compliance with exercise, home  and travel O2 prescription;Maintenance of O2 saturations>88%;Compliance with respiratory medication;Verbalizes importance of monitoring SPO2 with pulse oximeter and return demonstration;Exhibits proper breathing techniques, such as pursed lip breathing or other method taught during program session;Demonstrates proper use of MDI's             Oxygen Re-Evaluation:  Oxygen Re-Evaluation     Row Name 10/31/22 0848 11/23/22 1202 12/19/22 0909 01/16/23 0934 02/13/23 1206     Program Oxygen Prescription   Program Oxygen Prescription Continuous Continuous Continuous Continuous Continuous   Liters per minute 2 2 2 2 2      Home Oxygen   Home Oxygen Device Home Concentrator;Portable Concentrator;E-Tanks Home Concentrator;Portable Concentrator;E-Tanks Home Concentrator;Portable Concentrator;E-Tanks Home Concentrator;Portable Concentrator;E-Tanks Home Concentrator;Portable Concentrator;E-Tanks   Sleep Oxygen Prescription Continuous Continuous Continuous Continuous Continuous   Liters per minute 2 2 2 2 2     Home Exercise Oxygen Prescription Pulsed Pulsed Pulsed Pulsed Pulsed   Liters per minute 2 2 2 2 2    Home Resting Oxygen Prescription None None None None None   Compliance with Home Oxygen Use Yes Yes Yes Yes Yes     Goals/Expected Outcomes   Short Term Goals To learn and exhibit compliance with exercise, home and travel O2 prescription;To learn and understand importance of monitoring SPO2 with pulse oximeter and demonstrate accurate use of the pulse oximeter.;To learn and understand importance of maintaining oxygen saturations>88%;To learn and demonstrate proper pursed lip breathing techniques or other breathing techniques. ;To learn and demonstrate proper use of respiratory medications To learn and exhibit compliance with exercise, home and travel O2 prescription;To learn and understand importance of monitoring SPO2 with pulse oximeter and demonstrate accurate use of the pulse oximeter.;To learn and understand importance of maintaining oxygen saturations>88%;To learn and demonstrate proper pursed lip breathing techniques or other breathing techniques. ;To learn and demonstrate proper use of respiratory medications To learn and exhibit compliance with exercise, home and travel O2 prescription;To learn and understand importance of monitoring SPO2 with pulse oximeter and demonstrate accurate use of the pulse oximeter.;To learn and understand importance of maintaining oxygen saturations>88%;To learn and demonstrate proper pursed lip breathing techniques or other breathing techniques. ;To learn and demonstrate proper use of respiratory medications To learn and exhibit compliance with exercise, home and travel O2 prescription;To learn and understand importance of monitoring SPO2 with pulse oximeter and demonstrate accurate use of the pulse oximeter.;To learn and understand importance of maintaining oxygen saturations>88%;To learn and demonstrate proper pursed lip breathing techniques or other breathing  techniques. ;To learn and demonstrate proper use of respiratory medications To learn and exhibit compliance with exercise, home and travel O2 prescription;To learn and understand importance of monitoring SPO2 with pulse oximeter and demonstrate accurate use of the pulse oximeter.;To learn and understand importance of maintaining oxygen saturations>88%;To learn and demonstrate proper pursed lip breathing techniques or other breathing techniques. ;To learn and demonstrate proper use of respiratory medications   Long  Term Goals Exhibits compliance with exercise, home  and travel O2 prescription;Maintenance of O2 saturations>88%;Compliance with respiratory medication;Verbalizes importance of monitoring SPO2 with pulse oximeter and return demonstration;Exhibits proper breathing techniques, such as pursed lip breathing or other method taught during program session;Demonstrates proper use of MDI's Exhibits compliance with exercise, home  and travel O2 prescription;Maintenance of O2 saturations>88%;Compliance with respiratory medication;Verbalizes importance of monitoring SPO2 with pulse oximeter and  return demonstration;Exhibits proper breathing techniques, such as pursed lip breathing or other method taught during program session;Demonstrates proper use of MDI's Exhibits compliance with exercise, home  and travel O2 prescription;Maintenance of O2 saturations>88%;Compliance with respiratory medication;Verbalizes importance of monitoring SPO2 with pulse oximeter and return demonstration;Exhibits proper breathing techniques, such as pursed lip breathing or other method taught during program session;Demonstrates proper use of MDI's Exhibits compliance with exercise, home  and travel O2 prescription;Maintenance of O2 saturations>88%;Compliance with respiratory medication;Verbalizes importance of monitoring SPO2 with pulse oximeter and return demonstration;Exhibits proper breathing techniques, such as pursed lip breathing or  other method taught during program session;Demonstrates proper use of MDI's Exhibits compliance with exercise, home  and travel O2 prescription;Maintenance of O2 saturations>88%;Compliance with respiratory medication;Verbalizes importance of monitoring SPO2 with pulse oximeter and return demonstration;Exhibits proper breathing techniques, such as pursed lip breathing or other method taught during program session;Demonstrates proper use of MDI's   Goals/Expected Outcomes Compliance and understanding of oxygen saturations monitoring and breathing techniques to decrease shortness of breath. Compliance and understanding of oxygen saturations monitoring and breathing techniques to decrease shortness of breath. Compliance and understanding of oxygen saturations monitoring and breathing techniques to decrease shortness of breath. Compliance and understanding of oxygen saturations monitoring and breathing techniques to decrease shortness of breath. Compliance and understanding of oxygen saturations monitoring and breathing techniques to decrease shortness of breath.            Oxygen Discharge (Final Oxygen Re-Evaluation):  Oxygen Re-Evaluation - 02/13/23 1206       Program Oxygen Prescription   Program Oxygen Prescription Continuous    Liters per minute 2      Home Oxygen   Home Oxygen Device Home Concentrator;Portable Concentrator;E-Tanks    Sleep Oxygen Prescription Continuous    Liters per minute 2    Home Exercise Oxygen Prescription Pulsed    Liters per minute 2    Home Resting Oxygen Prescription None    Compliance with Home Oxygen Use Yes      Goals/Expected Outcomes   Short Term Goals To learn and exhibit compliance with exercise, home and travel O2 prescription;To learn and understand importance of monitoring SPO2 with pulse oximeter and demonstrate accurate use of the pulse oximeter.;To learn and understand importance of maintaining oxygen saturations>88%;To learn and demonstrate proper  pursed lip breathing techniques or other breathing techniques. ;To learn and demonstrate proper use of respiratory medications    Long  Term Goals Exhibits compliance with exercise, home  and travel O2 prescription;Maintenance of O2 saturations>88%;Compliance with respiratory medication;Verbalizes importance of monitoring SPO2 with pulse oximeter and return demonstration;Exhibits proper breathing techniques, such as pursed lip breathing or other method taught during program session;Demonstrates proper use of MDI's    Goals/Expected Outcomes Compliance and understanding of oxygen saturations monitoring and breathing techniques to decrease shortness of breath.             Initial Exercise Prescription:  Initial Exercise Prescription - 10/26/22 1200       Date of Initial Exercise RX and Referring Provider   Date 10/26/22    Referring Provider Desai    Expected Discharge Date 01/19/23      Oxygen   Oxygen Continuous    Liters 2    Maintain Oxygen Saturation 88% or higher      Treadmill   MPH 1.7    Grade 0    Minutes 15      Recumbant Elliptical   Level 1    Minutes 15  METs 1.5      Prescription Details   Frequency (times per week) 2    Duration Progress to 30 minutes of continuous aerobic without signs/symptoms of physical distress      Intensity   THRR 40-80% of Max Heartrate 66-131    Ratings of Perceived Exertion 11-13    Perceived Dyspnea 0-4      Progression   Progression Continue progressive overload as per policy without signs/symptoms or physical distress.      Resistance Training   Training Prescription Yes    Weight blue bands    Reps 10-15             Perform Capillary Blood Glucose checks as needed.  Exercise Prescription Changes:   Exercise Prescription Changes     Row Name 11/01/22 1200 11/15/22 1100 11/24/22 0941 12/08/22 1152 12/22/22 1158     Response to Exercise   Blood Pressure (Admit) 110/77 104/70 117/75 104/60 128/96   Blood  Pressure (Exercise) 109/83 112/70 -- -- --   Blood Pressure (Exit) 97/67 104/68 96/58 100/64 108/62   Heart Rate (Admit) 77 bpm 80 bpm 81 bpm 77 bpm 72 bpm   Heart Rate (Exercise) 104 bpm 98 bpm 97 bpm 92 bpm 91 bpm   Heart Rate (Exit) 83 bpm 91 bpm 91 bpm 81 bpm 79 bpm   Oxygen Saturation (Admit) 99 % 97 % 95 % 98 % 97 %   Oxygen Saturation (Exercise) 96 % 93 % 91 % 95 % 98 %   Oxygen Saturation (Exit) 97 % 97 % 97 % 96 % 97 %   Rating of Perceived Exertion (Exercise) 13 13 12.5 12 13    Perceived Dyspnea (Exercise) 2 2 3 2 2    Duration Continue with 30 min of aerobic exercise without signs/symptoms of physical distress. Continue with 30 min of aerobic exercise without signs/symptoms of physical distress. Continue with 30 min of aerobic exercise without signs/symptoms of physical distress. Continue with 30 min of aerobic exercise without signs/symptoms of physical distress. Continue with 30 min of aerobic exercise without signs/symptoms of physical distress.   Intensity THRR unchanged THRR unchanged THRR unchanged THRR unchanged THRR unchanged     Progression   Progression Continue to progress workloads to maintain intensity without signs/symptoms of physical distress. Continue to progress workloads to maintain intensity without signs/symptoms of physical distress. Continue to progress workloads to maintain intensity without signs/symptoms of physical distress. Continue to progress workloads to maintain intensity without signs/symptoms of physical distress. Continue to progress workloads to maintain intensity without signs/symptoms of physical distress.     Resistance Training   Training Prescription Yes Yes Yes Yes Yes   Weight blue bands blue bands blue bands blue bands blue bands   Reps 10-15 10-15 10-15 10-15 10-15   Time 10 Minutes 10 Minutes 10 Minutes 10 Minutes 10 Minutes     Interval Training   Interval Training -- -- -- Yes Yes   Equipment -- -- -- Treadmill Treadmill   Comments --  -- -- 89min@3mph  and 2% incline, @2mph  and 0% incline 73min@2mph &0%incline, 48min@mph &2%incline     Oxygen   Oxygen Continuous Continuous Continuous Continuous Continuous   Liters 2 2 2 2 2      Treadmill   MPH 1 1.1 2.1 3 --   Grade 0 0 1 2 --   Minutes 15 15 15 15  --   METs 1.6 2.1 2.2 4.1 --     Recumbant Elliptical   Level 1 1  2 2 3    Minutes 15 15 15 15 15    METs 2.1 2.6 2.6 3 2.4     Oxygen   Maintain Oxygen Saturation 88% or higher 88% or higher 88% or higher 88% or higher 88% or higher    Row Name 01/10/23 0900 01/24/23 1100 02/02/23 1158 02/21/23 1200       Response to Exercise   Blood Pressure (Admit) 96/66 100/60 104/60 100/60    Blood Pressure (Exercise) -- 118/64 -- 142/82    Blood Pressure (Exit) 94/68 96/58 94/68  92/62    Heart Rate (Admit) 73 bpm 65 bpm 75 bpm 76 bpm    Heart Rate (Exercise) 106 bpm 86 bpm 97 bpm 110 bpm    Heart Rate (Exit) 84 bpm 72 bpm 77 bpm 90 bpm    Oxygen Saturation (Admit) 100 % 98 % 97 % 97 %    Oxygen Saturation (Exercise) 92 % 95 % 95 % 92 %    Oxygen Saturation (Exit) 98 % 98 % 98 % 97 %    Rating of Perceived Exertion (Exercise) 12.5 13 13  12.5    Perceived Dyspnea (Exercise) 2 1 2 2     Duration Continue with 30 min of aerobic exercise without signs/symptoms of physical distress. Continue with 30 min of aerobic exercise without signs/symptoms of physical distress. Continue with 30 min of aerobic exercise without signs/symptoms of physical distress. Continue with 30 min of aerobic exercise without signs/symptoms of physical distress.    Intensity THRR unchanged THRR unchanged THRR unchanged THRR unchanged      Progression   Progression Continue to progress workloads to maintain intensity without signs/symptoms of physical distress. Continue to progress workloads to maintain intensity without signs/symptoms of physical distress. Continue to progress workloads to maintain intensity without signs/symptoms of physical distress. Continue  to progress workloads to maintain intensity without signs/symptoms of physical distress.      Resistance Training   Training Prescription Yes Yes Yes Yes    Weight blue bands blue bands blue bands blue bands    Reps 10-15 10-15 10-15 10-15    Time 10 Minutes 10 Minutes 10 Minutes 10 Minutes      Interval Training   Interval Training Yes -- Yes Yes    Equipment Treadmill -- Treadmill Treadmill    Comments 31min@1 .26mph&0%incline, 46min@3mph &2%incline -- 46min@1 .74moh&0%incline, 19min1.8mph&3%incline --      Oxygen   Oxygen Continuous Continuous Continuous Continuous    Liters 2 2 2 2       Treadmill   MPH 2 1.8 -- 2    Grade 3 0 -- 3    Minutes 15 15 -- 15    METs 4.12 2.2 -- 3.36      Recumbant Elliptical   Level 3 4 4 3     RPM -- 50 -- 58    Watts -- -- -- 78    Minutes 15 15 15 15     METs 2.7 2.9 3.5 3.6      Oxygen   Maintain Oxygen Saturation 88% or higher 88% or higher 88% or higher 88% or higher             Exercise Comments:   Exercise Comments     Row Name 11/01/22 1208           Exercise Comments Pt completed 1st day of exercise. She exercised for 15 min on the treadmill and recumbent elliptical. She averaged 1.6 METs at 1 mph and 0 incline on the treadmill and 2.1  METs at level 1 on the recumbent elliptical. Jill Glenn performed the warmup and cooldown standing without limitations. Jill Glenn did not tolerate the treadmill well and will be transfered to the track. Pt understands METs from previous participating.                Exercise Goals and Review:   Exercise Goals     Row Name 10/26/22 1107             Exercise Goals   Increase Physical Activity Yes       Intervention Provide advice, education, support and counseling about physical activity/exercise needs.;Develop an individualized exercise prescription for aerobic and resistive training based on initial evaluation findings, risk stratification, comorbidities and participant's personal goals.        Expected Outcomes Short Term: Attend rehab on a regular basis to increase amount of physical activity.;Long Term: Add in home exercise to make exercise part of routine and to increase amount of physical activity.;Long Term: Exercising regularly at least 3-5 days a week.       Increase Strength and Stamina Yes       Intervention Provide advice, education, support and counseling about physical activity/exercise needs.;Develop an individualized exercise prescription for aerobic and resistive training based on initial evaluation findings, risk stratification, comorbidities and participant's personal goals.       Expected Outcomes Short Term: Increase workloads from initial exercise prescription for resistance, speed, and METs.;Short Term: Perform resistance training exercises routinely during rehab and add in resistance training at home;Long Term: Improve cardiorespiratory fitness, muscular endurance and strength as measured by increased METs and functional capacity ( )       Able to understand and use rate of perceived exertion (RPE) scale Yes       Intervention Provide education and explanation on how to use RPE scale       Expected Outcomes Short Term: Able to use RPE daily in rehab to express subjective intensity level;Long Term:  Able to use RPE to guide intensity level when exercising independently       Able to understand and use Dyspnea scale Yes       Intervention Provide education and explanation on how to use Dyspnea scale       Expected Outcomes Short Term: Able to use Dyspnea scale daily in rehab to express subjective sense of shortness of breath during exertion;Long Term: Able to use Dyspnea scale to guide intensity level when exercising independently       Knowledge and understanding of Target Heart Rate Range (THRR) Yes       Intervention Provide education and explanation of THRR including how the numbers were predicted and where they are located for reference       Expected Outcomes  Short Term: Able to state/look up THRR;Long Term: Able to use THRR to govern intensity when exercising independently;Short Term: Able to use daily as guideline for intensity in rehab       Understanding of Exercise Prescription Yes       Intervention Provide education, explanation, and written materials on patient's individual exercise prescription       Expected Outcomes Short Term: Able to explain program exercise prescription;Long Term: Able to explain home exercise prescription to exercise independently                Exercise Goals Re-Evaluation :  Exercise Goals Re-Evaluation     Row Name 10/31/22 0844 11/23/22 1159 12/19/22 0906 01/16/23 0922 02/13/23 1202     Exercise Goal  Re-Evaluation   Exercise Goals Review Increase Physical Activity;Able to understand and use Dyspnea scale;Understanding of Exercise Prescription;Increase Strength and Stamina;Knowledge and understanding of Target Heart Rate Range (THRR);Able to understand and use rate of perceived exertion (RPE) scale Increase Physical Activity;Able to understand and use Dyspnea scale;Understanding of Exercise Prescription;Increase Strength and Stamina;Knowledge and understanding of Target Heart Rate Range (THRR);Able to understand and use rate of perceived exertion (RPE) scale Increase Physical Activity;Able to understand and use Dyspnea scale;Understanding of Exercise Prescription;Increase Strength and Stamina;Knowledge and understanding of Target Heart Rate Range (THRR);Able to understand and use rate of perceived exertion (RPE) scale Increase Physical Activity;Able to understand and use Dyspnea scale;Understanding of Exercise Prescription;Increase Strength and Stamina;Knowledge and understanding of Target Heart Rate Range (THRR);Able to understand and use rate of perceived exertion (RPE) scale Increase Physical Activity;Able to understand and use Dyspnea scale;Understanding of Exercise Prescription;Increase Strength and  Stamina;Knowledge and understanding of Target Heart Rate Range (THRR);Able to understand and use rate of perceived exertion (RPE) scale   Comments Jill Glenn is scheduled to begin Pulmonary Rehab again on 10/29. Will continue to monitor and progress as able. Jill Glenn has completed 6 exercise sessions. She exercises for 15 min on the recumbent elliptical and treadmill. She averages 2.6 METs at level 2 on the recumbent elliptical and 2.1 METs at 1.7 mph on the treadmill. She performs the warmup and cooldown standing without limitations. Jill Glenn has increased her workload for the recumbent elliptical as METs have remained the same. Jill Glenn has also progressed to treadmill walking. She did not tolerate treadmill walking on her first day of exercise but does now. Will continue to monitor and progress as able. Jill Glenn has completed 9 exercise sessions. She exercises for 15 min on the recumbent elliptical and treadmill. She averages 3 METs at level 2 on the recumbent elliptical and 2.2 METs on the treadmill. She performs the warmup and cooldown standing without limitations. Jill Glenn has progressed to interval training on the treadmill. She walks at 1.8 mph for 2 min and 2 mph and 3% incline for 4 min. Jill Glenn tolerates interval walking so far. Will increased level on recumbent elliptical. Will continue to monitor and progress as able. Jill Glenn has completed 14 exercise sessions. She exercises for 15 min on the recumbent elliptical and treadmill. She averages 2.7 METs at level 3 on the recumbent elliptical and 3.2 METs on the treadmill. She performs the warmup and cooldown standing without limitations. She has increased her interval training. Low speed and incline is 3 mph with 2% incline and 2 mph with 1.5 incine. Jill Glenn has not increased her recumbent elliptical level. Will encourage her to increase recumbent elliptical level. Will continue to monitor and progress as able. Jill Glenn has completed 20 exercise sessions. She exercises for  15 min on the recumbent elliptical and treadmill. She averages 3.5 METs at level 4 on the recumbent elliptical and 2.9 METs on the treadmill. She performs the warmup and cooldown standing without limitations. Jill Glenn has increased her level on the recumbent elliptical. She tolerates this progression wel. Her treadmill speed has also slightly increased. I asked Jill Glenn if she wanted to discuss home exercise. Jill Glenn feels she has all the resources she needs to exercise at home. Will continue to monitor and progress as able.   Expected Outcomes Through exercise at rehab and home, the patient will decrease shortness of breath with daily activities and feel confident in carrying out an exercise regimen at home. Through exercise at rehab and home, the patient will decrease shortness  of breath with daily activities and feel confident in carrying out an exercise regimen at home. Through exercise at rehab and home, the patient will decrease shortness of breath with daily activities and feel confident in carrying out an exercise regimen at home. Through exercise at rehab and home, the patient will decrease shortness of breath with daily activities and feel confident in carrying out an exercise regimen at home. Through exercise at rehab and home, the patient will decrease shortness of breath with daily activities and feel confident in carrying out an exercise regimen at home.            Discharge Exercise Prescription (Final Exercise Prescription Changes):  Exercise Prescription Changes - 02/21/23 1200       Response to Exercise   Blood Pressure (Admit) 100/60    Blood Pressure (Exercise) 142/82    Blood Pressure (Exit) 92/62    Heart Rate (Admit) 76 bpm    Heart Rate (Exercise) 110 bpm    Heart Rate (Exit) 90 bpm    Oxygen Saturation (Admit) 97 %    Oxygen Saturation (Exercise) 92 %    Oxygen Saturation (Exit) 97 %    Rating of Perceived Exertion (Exercise) 12.5    Perceived Dyspnea (Exercise) 2     Duration Continue with 30 min of aerobic exercise without signs/symptoms of physical distress.    Intensity THRR unchanged      Progression   Progression Continue to progress workloads to maintain intensity without signs/symptoms of physical distress.      Resistance Training   Training Prescription Yes    Weight blue bands    Reps 10-15    Time 10 Minutes      Interval Training   Interval Training Yes    Equipment Treadmill      Oxygen   Oxygen Continuous    Liters 2      Treadmill   MPH 2    Grade 3    Minutes 15    METs 3.36      Recumbant Elliptical   Level 3    RPM 58    Watts 78    Minutes 15    METs 3.6      Oxygen   Maintain Oxygen Saturation 88% or higher             Nutrition:  Target Goals: Understanding of nutrition guidelines, daily intake of sodium 1500mg , cholesterol 200mg , calories 30% from fat and 7% or less from saturated fats, daily to have 5 or more servings of fruits and vegetables.  Biometrics:    Nutrition Therapy Plan and Nutrition Goals:  Nutrition Therapy & Goals - 11/29/22 1532       Nutrition Therapy   Diet General Healthy Diet      Personal Nutrition Goals   Nutrition Goal Patient to improve dietary quality by using the plate method as a daily guide for meal planning to include lean protein/plant protein, fruits, vegetables, whole grains, and nonfat/low fat dairy as part of well balanced diet   goal in progress.   Personal Goal #2 Patient to identify strategies for weight loss of 0.5-2.0# per week of weight loss.   goal not met.   Comments Goals in progress. Jill Glenn has medical history of pulmonary HTN, COPD3, chronic respiratory failure. Per [facility] transplant RD documentation on 10/05/22, it is recommended that she lose weight to BMI 30/170# and ultimately BMI 27/150#. She does report history of over snacking on refined carbohydates, snacking in the  middle of the night, etc. She has previously completed pulmonary rehab in February  2024; her starting weight at that time was 69.8kg/153.6#. She reports using MyFitness Pal app to aid with tracking calories with goal set at ~1200kcals per patient. She has maintained her weight since starting with our program. She does acknowledge some emotional eating/binge type eating tendencies; she will begin seeing a behavioral health counselor.  Jill Glenn would continue to benefit from weight loss and decrease intake of refined carbohydrates to support pulmonary disease.      Intervention Plan   Intervention Prescribe, educate and counsel regarding individualized specific dietary modifications aiming towards targeted core components such as weight, hypertension, lipid management, diabetes, heart failure and other comorbidities.;Nutrition handout(s) given to patient.    Expected Outcomes Short Term Goal: Understand basic principles of dietary content, such as calories, fat, sodium, cholesterol and nutrients.;Long Term Goal: Adherence to prescribed nutrition plan.             Nutrition Assessments:  MEDIFICTS Score Key: >=70 Need to make dietary changes  40-70 Heart Healthy Diet <= 40 Therapeutic Level Cholesterol Diet  Flowsheet Row PULMONARY REHAB CHRONIC OBSTRUCTIVE PULMONARY DISEASE from 02/22/2022 in [facility]  Picture Your Plate Total Score on Discharge 50      Picture Your Plate Scores: <16 Unhealthy dietary pattern with much room for improvement. 41-50 Dietary pattern unlikely to meet recommendations for good health and room for improvement. 51-60 More healthful dietary pattern, with some room for improvement.  >60 Healthy dietary pattern, although there may be some specific behaviors that could be improved.    Nutrition Goals Re-Evaluation:  Nutrition Goals Re-Evaluation     Row Name 11/01/22 1307 11/29/22 1532           Goals   Current Weight 177 lb 14.6 oz (80.7 kg) 177 lb 14.6 oz (80.7 kg)      Comment  LDL 134, cholesterol 208, X0R 5.8 A1c 5.9; other most recent labs  LDL 134, cholesterol 208      Expected Outcome Jill Glenn has medical history of pulmonary HTN, COPD3, chronic respiratory failure. Per [facility] transplant RD documentation on 10/05/22, it is recommended that she lose weight to BMI 30/170# and ultimately BMI 27/150#. She does report history of over snacking on refined carbohydates, snacking in the middle of the night, etc. She has previously completed pulmonary rehab in February 2024; her starting weight at that time was 69.8kg/153.6#. She reports using MyFitness Pal app to aid with tracking calories with goal set at ~1200kcals per patient. Karcyn would continue to benefit from weight loss and decrease intake of refined carbohydrates to support pulmonary disease. Goals in progress. Mahkayla has medical history of pulmonary HTN, COPD3, chronic respiratory failure. Per [facility] transplant RD documentation on 10/05/22, it is recommended that she lose weight to BMI 30/170# and ultimately BMI 27/150#. She does report history of over snacking on refined carbohydates, snacking in the middle of the night, etc. She has previously completed pulmonary rehab in February 2024; her starting weight at that time was 69.8kg/153.6#. She reports using MyFitness Pal app to aid with tracking calories with goal set at ~1200kcals per patient. She has maintained her weight since starting with our program. She does acknowledge some emotional eating/binge type eating tendencies; she will begin seeing a behavioral health counselor. Geralyn would continue to benefit from weight loss and decrease intake of refined carbohydrates to support pulmonary disease.  Nutrition Goals Discharge (Final Nutrition Goals Re-Evaluation):  Nutrition Goals Re-Evaluation - 11/29/22 1532       Goals   Current Weight 177 lb 14.6 oz (80.7 kg)    Comment A1c 5.9; other most recent labs  LDL 134, cholesterol 208    Expected Outcome Goals in  progress. Jerlean has medical history of pulmonary HTN, COPD3, chronic respiratory failure. Per [facility] transplant RD documentation on 10/05/22, it is recommended that she lose weight to BMI 30/170# and ultimately BMI 27/150#. She does report history of over snacking on refined carbohydates, snacking in the middle of the night, etc. She has previously completed pulmonary rehab in February 2024; her starting weight at that time was 69.8kg/153.6#. She reports using MyFitness Pal app to aid with tracking calories with goal set at ~1200kcals per patient. She has maintained her weight since starting with our program. She does acknowledge some emotional eating/binge type eating tendencies; she will begin seeing a behavioral health counselor. Pietrina would continue to benefit from weight loss and decrease intake of refined carbohydrates to support pulmonary disease.             Psychosocial: Target Goals: Acknowledge presence or absence of significant depression and/or stress, maximize coping skills, provide positive support system. Participant is able to verbalize types and ability to use techniques and skills needed for reducing stress and depression.  Initial Review & Psychosocial Screening:  Initial Psych Review & Screening - 10/26/22 1103       Initial Review   Current issues with Current Depression;Current Anxiety/Panic;History of Depression;Current Stress Concerns    Comments Pt is stressed about her health and the ability to not work anymore.      Family Dynamics   Good Support System? Yes    Comments Husband, Alessandra Bevels, 2 daughters      Barriers   Psychosocial barriers to participate in program Psychosocial barriers identified (see note);The patient should benefit from training in stress management and relaxation.      Screening Interventions   Interventions Encouraged to exercise;To provide support and resources with identified psychosocial needs    Expected Outcomes Short Term goal:  Utilizing psychosocial counselor, staff and physician to assist with identification of specific Stressors or current issues interfering with healing process. Setting desired goal for each stressor or current issue identified.;Long Term Goal: Stressors or current issues are controlled or eliminated.;Short Term goal: Identification and review with participant of any Quality of Life or Depression concerns found by scoring the questionnaire.;Long Term goal: The participant improves quality of Life and PHQ9 Scores as seen by post scores and/or verbalization of changes             Quality of Life Scores:  Scores of 19 and below usually indicate a poorer quality of life in these areas.  A difference of  2-3 points is a clinically meaningful difference.  A difference of 2-3 points in the total score of the Quality of Life Index has been associated with significant improvement in overall quality of life, self-image, physical symptoms, and general health in studies assessing change in quality of life.  PHQ-9: Review Flowsheet  More data exists      02/15/2023 01/18/2023 11/28/2022 10/26/2022 02/22/2022  Depression screen PHQ 2/9  Decreased Interest   0  2 1  Down, Depressed, Hopeless   0  2 0  PHQ - 2 Score   0  4 1  Altered sleeping    3 1  Tired, decreased energy  3 1  Change in appetite    3 1  Feeling bad or failure about yourself     3 2  Trouble concentrating    0 0  Moving slowly or fidgety/restless    0 0  Suicidal thoughts    0 0  PHQ-9 Score    16 6  Difficult doing work/chores    Somewhat difficult Not difficult at all    Details       Information is confidential and restricted. Go to Review Flowsheets to unlock data.   Multiple values from one day are sorted in reverse-chronological order        Interpretation of Total Score  Total Score Depression Severity:  1-4 = Minimal depression, 5-9 = Mild depression, 10-14 = Moderate depression, 15-19 = Moderately severe depression,  20-27 = Severe depression   Psychosocial Evaluation and Intervention:  Psychosocial Evaluation - 10/26/22 1105       Psychosocial Evaluation & Interventions   Interventions Stress management education;Encouraged to exercise with the program and follow exercise prescription    Comments Jill Glenn is currently stressed about her health and not being able to work anymore. 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 She has finally been approved for disability so this has helped to decrease some of her stress. She is currently being worked up for a lung transplant at [facility]. She does admit to feeling depressed, but states she is getting ready to start therapy. She is also going to ask her therapist about starting psychotropic meds.    Expected Outcomes For Kaylany to participate in PR free of any psychosocial barreris or concerns    Continue Psychosocial Services  Follow up required by staff             Psychosocial Re-Evaluation:  Psychosocial Re-Evaluation     Row Name 10/28/22 1521 11/23/22 1130 12/16/22 0924 01/18/23 1535 02/15/23 1034     Psychosocial Re-Evaluation   Current issues with Current Stress Concerns;Current Anxiety/Panic;History of Depression;Current Depression Current Stress Concerns;Current Anxiety/Panic;History of Depression;Current Depression Current Depression;History of Depression;Current Psychotropic Meds;Current Anxiety/Panic;Current Stress Concerns Current Depression;History of Depression;Current Psychotropic Meds;Current Anxiety/Panic;Current Stress Concerns Current Depression;History of Depression;Current Psychotropic Meds;Current Anxiety/Panic;Current Stress Concerns   Comments No changes since orientation on 10/26/22. Jill Glenn is scheduled to start PR on 10/29 Jill Glenn has met with her psychiatrist and has also been referred to a counseler. No needs at this time. Raney denies any new psychosocial barriers or concerns. She has started taking  psychotropic meds to help with her anxiety and depression. She is also working with a therapist to help her navigate her feelings of worry/anger. Angelmarie is currently struggling with making the decision to go through with an endobronchial valve replacement. She states her husband does not want her to due to the risk associated with the procedure. This decision she has to make has increased her stress level. She is still working with her mental health therapist and taking her psychotropic meds. She states she feels her depression is a little better. Alayzha has made the decision not to go through with the endobronchial valve replacement. She felt the risk outweighed the benefits. She has peace about this decision. She is going to go through with the lung transplant evaluation which she is hopeful about. She feels her depression is stable at this time. She is compliant with taking her psychotropic meds and meeting with her mental health specialist.  No new barriers or concerns at this time.  Expected Outcomes For Jill Glenn to attend the program without psychosocial issues or concerns. For Jill Glenn to attend the program with less psychosocial issues or concerns. For Jill Glenn to attend the program with less psychosocial issues or concerns. For Jill Glenn to attend the program with less psychosocial issues or concerns. For Jill Glenn to attend the program with less psychosocial issues or concerns.   Interventions Encouraged to attend Pulmonary Rehabilitation for the exercise;Stress management education;Relaxation education Encouraged to attend Pulmonary Rehabilitation for the exercise Encouraged to attend Pulmonary Rehabilitation for the exercise;Stress management education Encouraged to attend Pulmonary Rehabilitation for the exercise;Stress management education Encouraged to attend Pulmonary Rehabilitation for the exercise   Continue Psychosocial Services  Follow up required by staff Follow up required by staff Follow up required  by staff Follow up required by staff Follow up required by staff            Psychosocial Discharge (Final Psychosocial Re-Evaluation):  Psychosocial Re-Evaluation - 02/15/23 1034       Psychosocial Re-Evaluation   Current issues with Current Depression;History of Depression;Current Psychotropic Meds;Current Anxiety/Panic;Current Stress Concerns    Comments Jill Glenn has made the decision not to go through with the endobronchial valve replacement. She felt the risk outweighed the benefits. She has peace about this decision. She is going to go through with the lung transplant evaluation which she is hopeful about. She feels her depression is stable at this time. She is compliant with taking her psychotropic meds and meeting with her mental health specialist.  No new barriers or concerns at this time.    Expected Outcomes For Jill Glenn to attend the program with less psychosocial issues or concerns.    Interventions Encouraged to attend Pulmonary Rehabilitation for the exercise    Continue Psychosocial Services  Follow up required by staff             Education: Education Goals: Education classes will be provided on a weekly basis, covering required topics. Participant will state understanding/return demonstration of topics presented.  Learning Barriers/Preferences:  Learning Barriers/Preferences - 10/26/22 1105       Learning Barriers/Preferences   Learning Barriers None    Learning Preferences Audio;Group Instruction;Individual Instruction;Verbal Instruction;Video;Written Material             Education Topics: Know Your Numbers Group instruction that is supported by a PowerPoint presentation. Instructor discusses importance of knowing and understanding resting, exercise, and post-exercise oxygen saturation, heart rate, and blood pressure. Oxygen saturation, heart rate, blood pressure, rating of perceived exertion, and dyspnea are reviewed along with a normal range for these  values.  Flowsheet Row PULMONARY REHAB CHRONIC OBSTRUCTIVE PULMONARY DISEASE from 01/05/2023 in [facility] for [facility], Vascular, [facility] Lung [facility]  Date 01/05/23  Educator EP  Instruction Review Code 1- Verbalizes Understanding       Exercise for the Pulmonary Patient Group instruction that is supported by a PowerPoint presentation. Instructor discusses benefits of exercise, core components of exercise, frequency, duration, and intensity of an exercise routine, importance of utilizing pulse oximetry during exercise, safety while exercising, and options of places to exercise outside of rehab.  Flowsheet Row PULMONARY REHAB CHRONIC OBSTRUCTIVE PULMONARY DISEASE from 12/29/2022 in [facility], Vascular, [facility] Lung [facility]  Date 12/29/22  Educator EP  Instruction Review Code 1- Verbalizes Understanding       MET Level  Group instruction provided by PowerPoint, verbal discussion, and written material to support subject matter. Instructor reviews what METs are and how  to increase METs.  Flowsheet Row PULMONARY REHAB CHRONIC OBSTRUCTIVE PULMONARY DISEASE from 11/24/2022 in [facility], [facility]  Date 11/24/22  Educator EP  Instruction Review Code 1- Verbalizes Understanding       Pulmonary Medications Verbally interactive group education provided by instructor with focus on inhaled medications and proper administration. Flowsheet Row PULMONARY REHAB CHRONIC OBSTRUCTIVE PULMONARY DISEASE from 12/22/2022 in [facility] Heart, Vascular, [facility] Lung [facility]  Date 12/22/22  Educator RT  Instruction Review Code 1- Verbalizes Understanding       Anatomy and Physiology of the Respiratory System Group instruction provided by PowerPoint, verbal discussion, and written material to support subject matter. Instructor reviews respiratory cycle and anatomical components of the  respiratory system and their functions. Instructor also reviews differences in obstructive and restrictive respiratory diseases with examples of each.  Flowsheet Row PULMONARY REHAB CHRONIC OBSTRUCTIVE PULMONARY DISEASE from 12/15/2022 in [facility] for Heart, Vascular, & Lung Health  Date 12/15/22  Educator RT  Instruction Review Code 1- Verbalizes Understanding       Oxygen Safety Group instruction provided by PowerPoint, verbal discussion, and written material to support subject matter. There is an overview of "What is Oxygen" and "Why do we need it".  Instructor also reviews how to create a safe environment for oxygen use, the importance of using oxygen as prescribed, and the risks of noncompliance. There is a brief discussion on traveling with oxygen and resources the patient may utilize. Flowsheet Row PULMONARY REHAB CHRONIC OBSTRUCTIVE PULMONARY DISEASE from 01/27/2022 in [facility] for Heart, Vascular, & Lung Health  Date 01/27/22  Educator RN  Instruction Review Code 1- Verbalizes Understanding       Oxygen Use Group instruction provided by PowerPoint, verbal discussion, and written material to discuss how supplemental oxygen is prescribed and different types of oxygen supply systems. Resources for more information are provided.  Flowsheet Row PULMONARY REHAB CHRONIC OBSTRUCTIVE PULMONARY DISEASE from 01/19/2023 in [facility] for Heart, Vascular, & Lung Health  Date 01/19/23  Educator RT  Instruction Review Code 1- Verbalizes Understanding       Breathing Techniques Group instruction that is supported by demonstration and informational handouts. Instructor discusses the benefits of pursed lip and diaphragmatic breathing and detailed demonstration on how to perform both.  Flowsheet Row PULMONARY REHAB CHRONIC OBSTRUCTIVE PULMONARY DISEASE from 01/26/2023 in [facility] for Heart,  Vascular, & Lung Health  Date 01/26/23  Educator RN  Instruction Review Code 1- Verbalizes Understanding        Risk Factor Reduction Group instruction that is supported by a PowerPoint presentation. Instructor discusses the definition of a risk factor, different risk factors for pulmonary disease, and how the heart and lungs work together. Flowsheet Row PULMONARY REHAB CHRONIC OBSTRUCTIVE PULMONARY DISEASE from 02/16/2023 in [facility] for Heart, Vascular, & Lung Health  Date 02/16/23  Educator EP  Instruction Review Code 1- Verbalizes Understanding       Pulmonary Diseases Group instruction provided by PowerPoint, verbal discussion, and written material to support subject matter. Instructor gives an overview of the different type of pulmonary diseases. There is also a discussion on risk factors and symptoms as well as ways to manage the diseases. Flowsheet Row PULMONARY REHAB CHRONIC OBSTRUCTIVE PULMONARY DISEASE from 12/08/2022 in [facility] for Heart, Vascular, & Lung Health  Date 12/08/22  Educator RT  Instruction Review Code 1- Verbalizes Understanding       Stress and Energy Conservation Group instruction provided by PowerPoint, verbal discussion, and written material to support subject matter. Instructor gives an overview of stress and the impact it can have on the body. Instructor also reviews ways to reduce stress. There is also a discussion on energy conservation and ways to conserve energy throughout the day. Flowsheet Row PULMONARY REHAB CHRONIC OBSTRUCTIVE PULMONARY DISEASE from 02/02/2023 in [facility] for Heart, Vascular, & Lung Health  Date 02/02/23  Educator RN  Instruction Review Code 1- Verbalizes Understanding       Warning Signs and Symptoms Group instruction provided by PowerPoint, verbal discussion, and written material to support subject matter. Instructor reviews warning signs and  symptoms of stroke, heart attack, cold and flu. Instructor also reviews ways to prevent the spread of infection. Flowsheet Row PULMONARY REHAB CHRONIC OBSTRUCTIVE PULMONARY DISEASE from 11/10/2022 in [facility] for Heart, Vascular, & Lung Health  Date 11/10/22  Educator RN  Instruction Review Code 1- Verbalizes Understanding       Other Education Group or individual verbal, written, or video instructions that support the educational goals of the pulmonary rehab program. Flowsheet Row PULMONARY REHAB CHRONIC OBSTRUCTIVE PULMONARY DISEASE from 03/01/2022 in [facility] for Heart, Vascular, & Lung Health  Date 03/01/22  Educator EP  Instruction Review Code 1- Verbalizes Understanding        Knowledge Questionnaire Score:  Knowledge Questionnaire Score - 10/26/22 1135       Knowledge Questionnaire Score   Pre Score 17/18             Core Components/Risk Factors/Patient Goals at Admission:  Personal Goals and Risk Factors at Admission - 10/26/22 1105       Core Components/Risk Factors/Patient Goals on Admission    Weight Management Yes;Weight Loss   pt wants to lose 10lbs   Intervention Weight Management: Develop a combined nutrition and exercise program designed to reach desired caloric intake, while maintaining appropriate intake of nutrient and fiber, sodium and fats, and appropriate energy expenditure required for the weight goal.;Weight Management: Provide education and appropriate resources to help participant work on and attain dietary goals.;Weight Management/Obesity: Establish reasonable short term and long term weight goals.;Obesity: Provide education and appropriate resources to help participant work on and attain dietary goals.    Expected Outcomes Short Term: Continue to assess and modify interventions until short term weight is achieved;Long Term: Adherence to nutrition and physical activity/exercise program aimed toward  attainment of established weight goal;Weight Loss: Understanding of general recommendations for a balanced deficit meal plan, which promotes 1-2 lb weight loss per week and includes a negative energy balance of [phone] kcal/d;Understanding recommendations for meals to include 15-35% energy as protein, 25-35% energy from fat, 35-60% energy from carbohydrates, less than 200mg  of dietary cholesterol, 20-35 gm of total fiber daily;Understanding of distribution of calorie intake throughout the day with the consumption of 4-5 meals/snacks    Improve shortness of breath with ADL's Yes    Intervention Provide education, individualized exercise plan and daily activity instruction to help decrease symptoms of SOB with activities of daily living.    Expected Outcomes Short Term: Improve cardiorespiratory fitness to achieve a reduction of symptoms when performing ADLs;Long Term: Be able to perform more ADLs without symptoms or delay the onset of symptoms    Stress Yes    Intervention Offer individual and/or small  group education and counseling on adjustment to heart disease, stress management and health-related lifestyle change. Teach and support self-help strategies.;Refer participants experiencing significant psychosocial distress to appropriate mental health specialists for further evaluation and treatment. When possible, include family members and significant others in education/counseling sessions.    Expected Outcomes Short Term: Participant demonstrates changes in health-related behavior, relaxation and other stress management skills, ability to obtain effective social support, and compliance with psychotropic medications if prescribed.;Long Term: Emotional wellbeing is indicated by absence of clinically significant psychosocial distress or social isolation.             Core Components/Risk Factors/Patient Goals Review:   Goals and Risk Factor Review     Row Name 10/28/22 1523 11/23/22 1137 12/16/22  0928 01/18/23 1539 02/15/23 1037     Core Components/Risk Factors/Patient Goals Review   Personal Goals Review Weight Management/Obesity;Improve shortness of breath with ADL's;Develop more efficient breathing techniques such as purse lipped breathing and diaphragmatic breathing and practicing self-pacing with activity.;Stress Weight Management/Obesity;Improve shortness of breath with ADL's;Develop more efficient breathing techniques such as purse lipped breathing and diaphragmatic breathing and practicing self-pacing with activity. Weight Management/Obesity;Improve shortness of breath with ADL's;Develop more efficient breathing techniques such as purse lipped breathing and diaphragmatic breathing and practicing self-pacing with activity.;Stress Weight Management/Obesity;Improve shortness of breath with ADL's Weight Management/Obesity;Improve shortness of breath with ADL's;Stress   Review Unable to assess. Fanchon is scheduled to start PR on 11/01/22 Goal progressing for weight loss. Merriel is working with staff dietitian to achieve her weight loss goals. Goal progressing on improving shortness of breath with ADL's. Goal progressing on developing more efficient breathing techniques such as purse lipped breathing and diaphragmatic breathing; and practicing self-pacing with activity. increase knowledge or respiratory medications and ability to use respiratory devices properly. Tristen is requiring 2L of O2 to maintain sats >88%. She has also been able to transition from walking the track to walking on the treadmill. We will continue to monitor Kimara's progress throughout the program. Goal progressing for weight loss. Goal progressing on improving shortness of breath with ADL's. Goal progressing on developing more efficient breathing techniques such as purse lipped breathing and diaphragmatic breathing; and practicing self-pacing with activity. Goal met on increasing knowledge of respiratory medications and ability  to use respiratory devices properly. Kalyssa has demonstrated proper use of MDI to staff.  Goal progressing on decreasing stress. Tobie is currently working with a  therapist on her mental health issues. She has also started taking psychotropic meds. Leydi is requiring 2L of O2 to maintain sats >88% while exercising. We will continue to monitor Katiejo's progress throughout the program. Goal progressing for weight loss. Goal progressing on improving shortness of breath with ADL's. Goal met on developing more efficient breathing techniques such as purse lipped breathing and diaphragmatic breathing; and practicing self-pacing with activity. Tessia is able to demonstrate purse lip breathing when she gets short of breath. Ishita is also able to pace herself based on her rate of perceived exertion scale. Goal progressing on decreasing stress.  Daisee is requiring 2L of O2 to maintain sats >88% while exercising. We will continue to monitor Latrese's progress throughout the program. Goal progressing for weight loss. Goal progressing on improving shortness of breath with ADL's. Goal progressing on decreasing stress.  Laniyah is requiring 2L of O2 to maintain sats >88% while exercising. She is currently exercising on the recumbant elliptical and the treadmill. We will continue to monitor Lamoyne's progress throughout the program.   Expected Outcomes  For Aizza to lose weight, improve her SOB with ADLs, decrease stress, and develop more efficient breathing techniques such as purse lipped breathing and diaphragmatic breathing; and practicing self-pacing with activity For Alicja to lose weight, improve her SOB with ADLs, decrease stress, and develop more efficient breathing techniques such as purse lipped breathing and diaphragmatic breathing; and practicing self-pacing with activity For Asani to lose weight, improve her SOB with ADLs, decrease stress, and develop more efficient breathing techniques such as purse lipped  breathing and diaphragmatic breathing; and practicing self-pacing with activity For Ellese to lose weight, improve her SOB with ADLs and decrease stress For Patric to lose weight, improve her SOB with ADLs and decrease stress            Core Components/Risk Factors/Patient Goals at Discharge (Final Review):   Goals and Risk Factor Review - 02/15/23 1037       Core Components/Risk Factors/Patient Goals Review   Personal Goals Review Weight Management/Obesity;Improve shortness of breath with ADL's;Stress    Review Goal progressing for weight loss. Goal progressing on improving shortness of breath with ADL's. Goal progressing on decreasing stress.  Phoua is requiring 2L of O2 to maintain sats >88% while exercising. She is currently exercising on the recumbant elliptical and the treadmill. We will continue to monitor Shamir's progress throughout the program.    Expected Outcomes For Felice to lose weight, improve her SOB with ADLs and decrease stress             ITP Comments: Pt is making expected progress toward Pulmonary Rehab goals after completing 23 session(s). Recommend continued exercise, life style modification, education, and utilization of breathing techniques to increase stamina and strength, while also decreasing shortness of breath with exertion.  Dr. Mechele Collin is Medical Director for Pulmonary Rehab at [facility].

## 2023-02-23 ENCOUNTER — Encounter (HOSPITAL_COMMUNITY): Payer: 59

## 2023-02-28 ENCOUNTER — Encounter: Payer: Self-pay | Admitting: Dermatology

## 2023-02-28 ENCOUNTER — Telehealth (HOSPITAL_COMMUNITY): Payer: Self-pay | Admitting: *Deleted

## 2023-02-28 ENCOUNTER — Encounter (HOSPITAL_COMMUNITY): Payer: 59

## 2023-02-28 NOTE — Progress Notes (Signed)
 Hi Jill Glenn  Dr. Onalee Hua reviewed your biopsy results and they showed the spot removed was a little "abnormal" but not cancerous.  No additional treatment is required.  We will continue to monitor the area for re-pigmentation during your annual skin exams. The detailed report is available to view in MyChart.  Have a great day!  Kind Regards,  Dr. Kermit Balo Care Team

## 2023-02-28 NOTE — Telephone Encounter (Signed)
 Patient left message on department VM. She's having serious stomach issues, unable to attend Pulmonary Rehab today.

## 2023-03-01 ENCOUNTER — Ambulatory Visit (INDEPENDENT_AMBULATORY_CARE_PROVIDER_SITE_OTHER): Payer: 59 | Admitting: Licensed Clinical Social Worker

## 2023-03-01 DIAGNOSIS — F17201 Nicotine dependence, unspecified, in remission: Secondary | ICD-10-CM | POA: Diagnosis not present

## 2023-03-01 DIAGNOSIS — F4310 Post-traumatic stress disorder, unspecified: Secondary | ICD-10-CM

## 2023-03-01 DIAGNOSIS — F322 Major depressive disorder, single episode, severe without psychotic features: Secondary | ICD-10-CM

## 2023-03-01 DIAGNOSIS — J449 Chronic obstructive pulmonary disease, unspecified: Secondary | ICD-10-CM | POA: Diagnosis not present

## 2023-03-01 DIAGNOSIS — F411 Generalized anxiety disorder: Secondary | ICD-10-CM

## 2023-03-01 NOTE — Progress Notes (Signed)
 THERAPIST PROGRESS NOTE   Session Date: 03/01/2023  Session Time: 1300 - 1353 Virtual Visit via Video Note  I connected with Jill Glenn on 03/01/23 at  1:00 PM EST by a video enabled telemedicine application and verified that I am speaking with the correct person using two identifiers.  Location: Patient: Home - Back yard Provider: [facility] [place] [facility]   I discussed the limitations of evaluation and management by telemedicine and the availability of in person appointments. The patient expressed understanding and agreed to proceed.   I discussed the assessment and treatment plan with the patient. The patient was provided an opportunity to ask questions and all were answered. The patient agreed with the plan and demonstrated an understanding of the instructions.   The patient was advised to call back or seek an in-person evaluation if the symptoms worsen or if the condition fails to improve as anticipated.  I provided 52 minutes of non-face-to-face time during this encounter.  Participation Level: Active  Behavioral Response: Well GroomedAlertAnxious and Depressed  Type of Therapy: Individual Therapy  Treatment Goals addressed:  Active    Anxiety    LTG: "Work on communicating my needs better"        Depression    LTG: Reduce frequency, intensity, and duration of depression symptoms so that daily functioning is improved      LTG: Increase coping skills to manage depression and improve ability to perform daily activities      LTG: "I just want to be happy"        ProgressTowards Goals: Progressing  Interventions: CBT, Motivational Interviewing, and Supportive  Summary: Jill Glenn is a 58 y.o. female with past psych history of MDD, GAD, and PTSD, presenting for follow-up therapy session in efforts to improve management of depressive and anxious symptoms. Patient actively engaged in session, presenting in depressed moods and congruent affect throughout.   -Patient  openly engaged in introductory check-in, sharing of challenges experienced last week surrounding illness, experiencing flareups, resulting in inability to attend scheduled visit last week. - Patient actively participated in reassessment of presenting depressive and anxious symptoms experienced over the past 2 weeks, via PHQ-9 and GAD-7, participating in further discussion surrounding minor increase in depressive symptoms and continued reduction of anxious symptoms. -Actively processed contributing factors and/or identifiable stressors in relation to increase of depressive symptoms over the past 2 weeks, identifying challenges with respiratory illness proving to have a negative impact on moods.  Patient identified and shared of further stressors, sharing of her husband having been increasingly volatile over recent weeks resulting in patient just shutting down, further sharing of her husband's explosive behavior being unmanageable any longer having reached the point of patient considering leaving over recent weeks.  Patient further detailed husbands explosive behaviors will be intermittent, taking frustrations out on patient, then will not talk for a couple of days, before interactions return to that of the typical nature.  -Further explored history of husband having taken frustrations out on Jill Glenn, previous factors proving to eliminate possibility such a Jill Glenn previously working, hx of trends, and potential current factors of husbands fear of accepting and understanding own feelings, exacerbated by additional fears of losing Jill Glenn to current illness.  Patient responded well to interventions. Patient continues to meet criteria for MDD, GAD, and PTSD. Patient will continue to benefit from engagement in outpatient therapy due to being the least restrictive service to meet presenting needs.      03/01/2023    1:08 PM 02/15/2023  3:12 PM 01/18/2023    1:19 PM 11/28/2022    1:11 PM 11/28/2022    9:06 AM  Depression  screen PHQ 2/9  Decreased Interest 3 1 2  0 3  Down, Depressed, Hopeless 3 1 1  0 2  PHQ - 2 Score 6 2 3  0 5  Altered sleeping 1 3 3  3   Tired, decreased energy 3 1 2  3   Change in appetite 0 0 0  1  Feeling bad or failure about yourself  1 1 3  3   Trouble concentrating 0 2 0  0  Moving slowly or fidgety/restless 0 0 0  1  Suicidal thoughts 0 0 0  0  PHQ-9 Score 11 9 11  16   Difficult doing work/chores Somewhat difficult Somewhat difficult Somewhat difficult  Very difficult      03/01/2023    1:06 PM 02/15/2023    3:06 PM 01/18/2023    1:17 PM 11/28/2022    9:04 AM  GAD 7 : Generalized Anxiety Score  Nervous, Anxious, on Edge 2 1 2 2   Control/stop worrying 2 3 3 3   Worry too much - different things 2 3 3 3   Trouble relaxing 2 1 3 3   Restless 1 3 2 2   Easily annoyed or irritable 2 1 3 3   Afraid - awful might happen 0 3 3 3   Total GAD 7 Score 11 15 19 19   Anxiety Difficulty Somewhat difficult Somewhat difficult Somewhat difficult Very difficult     Suicidal/Homicidal: Nowithout intent/plan  Therapist Response: Clinician utilized CBT, MI, and solution focused interventions to address Jill Glenn's identified presenting problems and depressive and anxious sxs. Engaged Jill Glenn in check-in, assessing presenting moods and affect.  - Actively engaged Jill Glenn in brief check-in, supported Jill Glenn's reflections of challenges experienced over the past week with flare up of sxs in relation to respiratory disease. - Reassessed presenting sxs via PHQ-9 and GAD-7, further engaging Jill Glenn in review of current scores and variances, eliciting Jill Glenn's thoughts and perspectives surrounding observed increase in sxs and contributing stressors. - Actively listened to Jill Glenn's recounts of events of the past two weeks, providing support in processing events,  further evoking Jill Glenn's thoughts and feelings in relation to challenges and stressors, validating feelings, and supporting in exploring challenging recounts from hx.   Clinician  reassessed severity of depressive and anxious sxs, and presence of any safety concerns. Therapist provided support and empathy to patient during session.  Plan: Return again in 2 week.  Diagnosis:  Encounter Diagnoses  Name Primary?   Generalized anxiety disorder Yes   Severe major depression without psychotic features (HCC)    PTSD (post-traumatic stress disorder)       Collaboration of Care: Psychiatrist AEB provider notes available in EHR.  Patient/Guardian was advised Release of Information must be obtained prior to any record release in order to collaborate their care with an outside provider. Patient/Guardian was advised if they have not already done so to contact the registration department to sign all necessary forms in order for [place] to release information regarding their care.   Consent: Patient/Guardian gives verbal consent for treatment and assignment of benefits for services provided during this visit. Patient/Guardian expressed understanding and agreed to proceed.   Leisa Lenz, MSW, LCSW 03/01/2023,  1:11 PM

## 2023-03-02 ENCOUNTER — Encounter (HOSPITAL_COMMUNITY)
Admission: RE | Admit: 2023-03-02 | Discharge: 2023-03-02 | Disposition: A | Discharge status: Home / Self Care | Payer: 59 | Source: Ambulatory Visit | Attending: Internal Medicine | Admitting: Internal Medicine

## 2023-03-02 DIAGNOSIS — J449 Chronic obstructive pulmonary disease, unspecified: Secondary | ICD-10-CM

## 2023-03-02 NOTE — Progress Notes (Signed)
 Daily Session Note  Patient Details  Name: Jill Glenn MRN: [id number] Date of Birth: 20-Oct-1965 Referring Provider:   Doristine Devoid Pulmonary Rehab Walk Test from 10/26/2022 in [facility] for Heart, Vascular, & Lung Health  Referring Provider [name]       Encounter Date: 03/02/2023  Check In:  Session Check In - 03/02/23 1036       Check-In   Supervising physician immediately available to respond to emergencies [facility] MD immediately available    Physician(s) Bernadene Person, NP    Location [facility]    Staff Present Essie Hart, RN, BSN;Casey Smith, Zella Richer, MS, ACSM-CEP, Exercise Physiologist;Randi Idelle Crouch BS, ACSM-CEP, Exercise Physiologist;Samantha Belarus, RD, LDN;Jetta Walker BS, ACSM-CEP, Exercise Physiologist    Virtual Visit No    Medication changes reported     No    Fall or balance concerns reported    No    Tobacco Cessation No Change    Warm-up and Cool-down Performed as group-led instruction    Resistance Training Performed Yes    VAD Patient? No    PAD/SET Patient? No      Pain Assessment   Currently in Pain? No/denies             Capillary Blood Glucose: No results found for this or any previous visit (from the past 24 hours).    Social History   Tobacco Use  Smoking Status Former   Current packs/day: 0.00   Average packs/day: 1 pack/day for 40.6 years (40.6 ttl pk-yrs)   Types: Cigarettes   Start date: 03/05/1981   Quit date: 10/14/2021   Years since quitting: 1.3  Smokeless Tobacco Never    Goals Met:  Proper associated with RPD/PD & O2 Sat Independence with exercise equipment Exercise tolerated well No report of concerns or symptoms today Strength training completed today  Goals Unmet:  Not Applicable  Comments: Service time is from 1007 to 1150.    Dr. Mechele Collin is Medical Director for Pulmonary Rehab at [facility]

## 2023-03-03 DIAGNOSIS — R0602 Shortness of breath: Secondary | ICD-10-CM | POA: Diagnosis not present

## 2023-03-03 DIAGNOSIS — J449 Chronic obstructive pulmonary disease, unspecified: Secondary | ICD-10-CM | POA: Diagnosis not present

## 2023-03-03 DIAGNOSIS — J432 Centrilobular emphysema: Secondary | ICD-10-CM | POA: Diagnosis not present

## 2023-03-07 ENCOUNTER — Encounter (HOSPITAL_COMMUNITY)
Admission: RE | Admit: 2023-03-07 | Discharge: 2023-03-07 | Disposition: A | Discharge status: Home / Self Care | Payer: 59 | Source: Ambulatory Visit | Attending: Internal Medicine | Admitting: Internal Medicine

## 2023-03-07 VITALS — Wt 178.4 lb

## 2023-03-07 DIAGNOSIS — J449 Chronic obstructive pulmonary disease, unspecified: Secondary | ICD-10-CM | POA: Diagnosis not present

## 2023-03-07 NOTE — Progress Notes (Signed)
 Daily Session Note  Patient Details  Name: Jill Glenn MRN: [id number] Date of Birth: 1965-09-27 Referring Provider:   Doristine Devoid Pulmonary Rehab Walk Test from 10/26/2022 in [facility]  Referring Provider [name]       Encounter Date: 03/07/2023  Check In:  Session Check In - 03/07/23 1025       Check-In   Supervising physician immediately available to respond to emergencies [facility] MD immediately available    Physician(s) Bernadene Person, NP    Location [facility]    Staff Present Essie Hart, RN, BSN;Casey Smith, Zella Richer, MS, ACSM-CEP, Exercise Physiologist;Randi Idelle Crouch BS, ACSM-CEP, Exercise Physiologist;Samantha Belarus, RD, LDN;Jetta Walker BS, ACSM-CEP, Exercise Physiologist    Virtual Visit No    Medication changes reported     No    Fall or balance concerns reported    No    Tobacco Cessation No Change    Warm-up and Cool-down Performed as group-led instruction    Resistance Training Performed Yes    VAD Patient? No    PAD/SET Patient? No      Pain Assessment   Currently in Pain? No/denies    Multiple Pain Sites No             Capillary Blood Glucose: No results found for this or any previous visit (from the past 24 hours).   Exercise Prescription Changes - 03/07/23 1100       Response to Exercise   Blood Pressure (Admit) 98/60    Blood Pressure (Exercise) 134/78    Blood Pressure (Exit) 90/64    Heart Rate (Admit) 67 bpm    Heart Rate (Exercise) 95 bpm    Heart Rate (Exit) 97 bpm    Oxygen Saturation (Admit) 98 %    Oxygen Saturation (Exercise) 93 %    Oxygen Saturation (Exit) 97 %    Rating of Perceived Exertion (Exercise) 13    Perceived Dyspnea (Exercise) 2    Duration Continue with 30 min of aerobic exercise without signs/symptoms of physical distress.    Intensity THRR unchanged      Progression   Progression Continue to progress workloads to maintain  intensity without signs/symptoms of physical distress.      Resistance Training   Training Prescription Yes    Weight blue bands    Reps 10-15    Time 10 Minutes      Interval Training   Interval Training Yes    Equipment Treadmill    Comments 39min@2mph &3%incline METS3.2, 49min1.8mph&0%incline, METS2.2      Oxygen   Oxygen Continuous    Liters 2      Treadmill   MPH 2    Grade 3    Minutes 15    METs 3.36      Recumbant Elliptical   Level 4    Minutes 15    METs 3.8      Oxygen   Maintain Oxygen Saturation 88% or higher             Social History   Tobacco Use  Smoking Status Former   Current packs/day: 0.00   Average packs/day: 1 pack/day for 40.6 years (40.6 ttl pk-yrs)   Types: Cigarettes   Start date: 03/05/1981   Quit date: 10/14/2021   Years since quitting: 1.3  Smokeless Tobacco Never    Goals Met:  Independence with exercise equipment Exercise tolerated well No report of concerns or symptoms today  Strength training completed today  Goals Unmet:  Not Applicable  Comments: Service time is from 1009 to 1128    Dr. Mechele Collin is Medical Director for Pulmonary Rehab at [facility].

## 2023-03-09 ENCOUNTER — Telehealth (HOSPITAL_COMMUNITY): Payer: Self-pay | Admitting: *Deleted

## 2023-03-09 ENCOUNTER — Encounter (HOSPITAL_COMMUNITY)
Admission: RE | Admit: 2023-03-09 | Discharge: 2023-03-09 | Disposition: A | Discharge status: Home / Self Care | Payer: 59 | Source: Ambulatory Visit | Attending: Internal Medicine | Admitting: Internal Medicine

## 2023-03-09 NOTE — Telephone Encounter (Signed)
 Shaleen left a voice mail message at 9:21am informing staff she will miss pulmonary rehab class due to not feeling well.

## 2023-03-14 ENCOUNTER — Encounter (HOSPITAL_COMMUNITY)
Admission: RE | Admit: 2023-03-14 | Discharge: 2023-03-14 | Disposition: A | Discharge status: Home / Self Care | Payer: 59 | Source: Ambulatory Visit | Attending: Internal Medicine | Admitting: Internal Medicine

## 2023-03-14 DIAGNOSIS — J449 Chronic obstructive pulmonary disease, unspecified: Secondary | ICD-10-CM | POA: Diagnosis not present

## 2023-03-14 NOTE — Progress Notes (Signed)
 Daily Session Note  Patient Details  Name: Jill Glenn MRN: [id number] Date of Birth: Feb 24, 1965 Referring Provider:   Doristine Devoid Pulmonary Rehab Walk Test from 10/26/2022 in [facility]  Referring Provider [name]       Encounter Date: 03/14/2023  Check In:  Session Check In - 03/14/23 1134       Check-In   Supervising physician immediately available to respond to emergencies [facility] MD immediately available    Physician(s) Tereso Newcomer, PA    Location [facility]    Staff Present Essie Hart, RN, BSN;Chamari Cutbirth Katrinka Blazing, Zella Richer, MS, ACSM-CEP, Exercise Physiologist;Randi Idelle Crouch BS, ACSM-CEP, Exercise Physiologist    Virtual Visit No    Medication changes reported     No    Fall or balance concerns reported    No    Tobacco Cessation No Change    Warm-up and Cool-down Performed as group-led instruction    Resistance Training Performed Yes    VAD Patient? No    PAD/SET Patient? No      Pain Assessment   Currently in Pain? No/denies    Multiple Pain Sites No             Capillary Blood Glucose: No results found for this or any previous visit (from the past 24 hours).    Social History   Tobacco Use  Smoking Status Former   Current packs/day: 0.00   Average packs/day: 1 pack/day for 40.6 years (40.6 ttl pk-yrs)   Types: Cigarettes   Start date: 03/05/1981   Quit date: 10/14/2021   Years since quitting: 1.4  Smokeless Tobacco Never    Goals Met:  Proper associated with RPD/PD & O2 Sat Independence with exercise equipment Exercise tolerated well No report of concerns or symptoms today Strength training completed today  Goals Unmet:  Not Applicable  Comments: Service time is from 1004 to 1118.    Dr. Mechele Collin is Medical Director for Pulmonary Rehab at [facility].

## 2023-03-16 ENCOUNTER — Encounter (HOSPITAL_COMMUNITY)
Admission: RE | Admit: 2023-03-16 | Discharge: 2023-03-16 | Disposition: A | Discharge status: Home / Self Care | Payer: 59 | Source: Ambulatory Visit | Attending: Internal Medicine | Admitting: Internal Medicine

## 2023-03-16 ENCOUNTER — Ambulatory Visit (INDEPENDENT_AMBULATORY_CARE_PROVIDER_SITE_OTHER): Payer: 59 | Admitting: Licensed Clinical Social Worker

## 2023-03-16 DIAGNOSIS — F411 Generalized anxiety disorder: Secondary | ICD-10-CM | POA: Diagnosis not present

## 2023-03-16 DIAGNOSIS — J449 Chronic obstructive pulmonary disease, unspecified: Secondary | ICD-10-CM

## 2023-03-16 DIAGNOSIS — F322 Major depressive disorder, single episode, severe without psychotic features: Secondary | ICD-10-CM

## 2023-03-16 NOTE — Progress Notes (Signed)
 Daily Session Note  Patient Details  Name: Jill Glenn MRN: [id number] Date of Birth: [birth year] Referring Provider:   Doristine Devoid Pulmonary Rehab Walk Test from 10/26/2022 in [facility]  Referring Provider [name]       Encounter Date: 03/16/2023  Check In:  Session Check In - 03/16/23 1041       Check-In   Supervising physician immediately available to respond to emergencies [facility] immediately available    Physician(s) Edd Fabian, NP    [facility]    Staff Present Durel Salts, Zella Richer, MS, ACSM-CEP, Exercise Physiologist;Randi Dionisio Paschal, ACSM-CEP, Exercise Physiologist;Johnny Hale Bogus, MS, Exercise Physiologist    Virtual Visit No    Medication changes reported     No    Fall or balance concerns reported    No    Tobacco Cessation No Change    Warm-up and Cool-down Performed as group-led instruction    Resistance Training Performed Yes    VAD Patient? No      Pain Assessment   Currently in Pain? No/denies             Capillary Blood Glucose: No results found for this or any previous visit (from the past 24 hours).    Social History   Tobacco Use  Smoking Status Former   Current packs/day: 0.00   Average packs/day: 1 pack/day for 40.6 years (40.6 ttl pk-yrs)   Types: Cigarettes   Start date: 03/05/1981   Quit date: 10/14/2021   Years since quitting: 1.4  Smokeless Tobacco Never    Goals Met:  Proper associated with RPD/PD & O2 Sat Independence with exercise equipment Exercise tolerated well No report of concerns or symptoms today Strength training completed today  Goals Unmet:  Not Applicable  Comments: Service time is from 1008 to 1135.    Dr. Mechele Collin is Medical Director for Pulmonary Rehab at [facility].
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
## 2023-03-21 ENCOUNTER — Encounter (HOSPITAL_COMMUNITY)
Admission: RE | Admit: 2023-03-21 | Discharge: 2023-03-21 | Disposition: A | Discharge status: Home / Self Care | Payer: 59 | Source: Ambulatory Visit | Attending: Internal Medicine | Admitting: Internal Medicine

## 2023-03-21 VITALS — Wt 178.6 lb

## 2023-03-21 DIAGNOSIS — J449 Chronic obstructive pulmonary disease, unspecified: Secondary | ICD-10-CM

## 2023-03-21 NOTE — Progress Notes (Signed)
 Daily Session Note  Patient Details  Name: Jill Glenn MRN: [id number] Date of Birth: March 04, 1965 Referring Provider:   Doristine Devoid Pulmonary Rehab Walk Test from 10/26/2022 in [facility]  Referring Provider [name]       Encounter Date: 03/21/2023  Check In:  Session Check In - 03/21/23 1109       Check-In   Supervising physician immediately available to respond to emergencies [facility] MD immediately available    Physician(s) Edd Fabian, NP    Location [facility]    Staff Present Durel Salts, Zella Richer, MS, ACSM-CEP, Exercise Physiologist;Randi Dionisio Paschal, ACSM-CEP, Exercise Physiologist;Samantha Belarus, RD, [place] Gray, RN, BSN    Virtual Visit No    Fall or balance concerns reported    No    Tobacco Cessation No Change    Warm-up and Cool-down Performed as group-led Writer Performed Yes    VAD Patient? No      Pain Assessment   Currently in Pain? No/denies             Capillary Blood Glucose: No results found for this or any previous visit (from the past 24 hours).   Exercise Prescription Changes - 03/21/23 1100       Response to Exercise   Blood Pressure (Admit) 96/56    Blood Pressure (Exercise) 118/54    Blood Pressure (Exit) 90/56    Heart Rate (Admit) 67 bpm    Heart Rate (Exercise) 99 bpm    Heart Rate (Exit) 82 bpm    Oxygen Saturation (Admit) 96 %    Oxygen Saturation (Exercise) 93 %    Oxygen Saturation (Exit) 96 %    Rating of Perceived Exertion (Exercise) 13    Perceived Dyspnea (Exercise) 2    Duration Continue with 30 min of aerobic exercise without signs/symptoms of physical distress.    Intensity THRR unchanged      Progression   Progression Continue to progress workloads to maintain intensity without signs/symptoms of physical distress.      Resistance Training   Training Prescription Yes    Weight blue bands    Reps  10-15    Time 10 Minutes      Interval Training   Interval Training Yes    Equipment Treadmill    Comments 67min@2 .77mph&3%incline METS 3.6, 62min@1 .22mph&0%incline METS 2.2      Oxygen   Oxygen Continuous    Liters 2      Recumbant Elliptical   Level 4    Minutes 15    METs 4      Oxygen   Maintain Oxygen Saturation 88% or higher             Social History   Tobacco Use  Smoking Status Former   Current packs/day: 0.00   Average packs/day: 1 pack/day for 40.6 years (40.6 ttl pk-yrs)   Types: Cigarettes   Start date: 03/05/1981   Quit date: 10/14/2021   Years since quitting: 1.4  Smokeless Tobacco Never    Goals Met:  Proper associated with RPD/PD & O2 Sat Independence with exercise equipment Exercise tolerated well No report of concerns or symptoms today Strength training completed today  Goals Unmet:  Not Applicable  Comments: Service time is from 1006 to 1130.    Dr. Mechele Collin is Medical Director for Pulmonary Rehab at [facility].

## 2023-03-22 ENCOUNTER — Encounter (HOSPITAL_COMMUNITY): Payer: Self-pay | Admitting: Student

## 2023-03-22 ENCOUNTER — Ambulatory Visit (HOSPITAL_BASED_OUTPATIENT_CLINIC_OR_DEPARTMENT_OTHER): Payer: 59 | Admitting: Student

## 2023-03-22 DIAGNOSIS — F411 Generalized anxiety disorder: Secondary | ICD-10-CM

## 2023-03-22 MED ORDER — BUSPIRONE HCL 15 MG PO TABS
15.0000 mg | ORAL_TABLET | Freq: Two times a day (BID) | ORAL | 0 refills | Status: AC
Start: 2023-03-22 — End: 2023-07-18

## 2023-03-22 MED ORDER — CITALOPRAM HYDROBROMIDE 10 MG PO TABS: 10.0000 mg | ORAL_TABLET | Freq: Every day | ORAL | 0 refills | Status: DC

## 2023-03-22 MED ORDER — HYDROXYZINE HCL 25 MG PO TABS: 25.0000 mg | ORAL_TABLET | Freq: Every evening | ORAL | 0 refills | Status: DC | PRN

## 2023-03-22 NOTE — Progress Notes (Signed)
 Pulmonary Individual Treatment Plan  Patient Details  Name: Jill Glenn MRN: [id number] Date of Birth: 05-Feb-1965 Referring Provider:   Doristine Devoid Pulmonary Rehab Walk Test from 10/26/2022 in [facility]  Referring Provider [name]       Initial Encounter Date:  Flowsheet Row Pulmonary Rehab Walk Test from 10/26/2022 in [facility]  Date 10/26/22       Visit Diagnosis: Stage 3 severe COPD by GOLD classification (HCC)  Patient's Home Medications on Admission:   Current Outpatient Medications:    acetaminophen (TYLENOL) 325 MG tablet, Take 2 tablets (650 mg total) by mouth every 6 (six) hours as needed for moderate pain, mild pain or headache., Disp: , Rfl:    albuterol (PROVENTIL) (2.5 MG/3ML) 0.083% nebulizer solution, Take 3 mLs (2.5 mg total) by nebulization every 6 (six) hours as needed for wheezing or shortness of breath., Disp: 75 mL, Rfl: 12   albuterol (VENTOLIN HFA) 108 (90 Base) MCG/ACT inhaler, Inhale 2 puffs into the lungs every 6 (six) hours as needed for wheezing or shortness of breath., Disp: 18 g, Rfl: 3   Ascorbic Acid (VITAMIN C GUMMIES PO), Take 500 mg by mouth daily., Disp: , Rfl:    busPIRone (BUSPAR) 15 MG tablet, Take 1 tablet (15 mg total) by mouth 2 (two) times daily., Disp: 180 tablet, Rfl: 0   citalopram (CELEXA) 10 MG tablet, Take 1 tablet (10 mg total) by mouth daily., Disp: 90 tablet, Rfl: 0   hydrOXYzine (ATARAX) 25 MG tablet, Take 1 tablet (25 mg total) by mouth at bedtime as needed (sleep)., Disp: 90 tablet, Rfl: 0   levocetirizine (XYZAL) 5 MG tablet, Take 1 tablet (5 mg total) by mouth every evening., Disp: 30 tablet, Rfl: 11   montelukast (SINGULAIR) 10 MG tablet, Take 1 tablet (10 mg total) by mouth at bedtime., Disp: 30 tablet, Rfl: 11   Multiple Vitamin (MULTIVITAMIN ADULT) TABS, Take 1 tablet by mouth daily with  breakfast., Disp: , Rfl:    rizatriptan (MAXALT) 10 MG tablet, Take 1 tablet (10 mg total) by mouth once as needed for migraine. May repeat in 2 hours if needed, Disp: 30 tablet, Rfl: 2   TRELEGY ELLIPTA 200-62.5-25 MCG/ACT AEPB, Inhale 1 Inhalation into the lungs daily., Disp: 1 each, Rfl: 5   Ubrogepant (UBRELVY) 100 MG TABS, Take one tablet onset of Migraine May take another tablet 2 hours later, Don't exceed two tablets daily, Disp: 16 tablet, Rfl: 11   varenicline (CHANTIX) 1 MG tablet, Take 1 mg by mouth daily., Disp: , Rfl:   Past Medical History: Past Medical History:  Diagnosis Date   Allergy    seasonal   Anxiety    Asthma    COPD (chronic obstructive pulmonary disease) (HCC)    Depression    Emphysema of lung (HCC)    PTSD (post-traumatic stress disorder)     Tobacco Use: Social History   Tobacco Use  Smoking Status Former   Current packs/day: 0.00   Average packs/day: 1 pack/day for 40.6 years (40.6 ttl pk-yrs)   Types: Cigarettes   Start date: 03/05/1981   Quit date: 10/14/2021   Years since quitting: 1.4  Smokeless Tobacco Never    Labs: Review Flowsheet  More data may exist      Latest Ref Rng & Units 11/02/2020 12/21/2020 03/24/2022 04/12/2022 11/16/2022  Labs for ITP Cardiac and Pulmonary Rehab  Cholestrol  0 - 200 mg/dL - 644  - 034  -  LDL (calc) 0 - 99 mg/dL - 91  - 742  -  HDL-C >59.56 mg/dL - 38.75  - 64.33  -  Trlycerides 0.0 - 149.0 mg/dL - 29.5  - 188.4  -  Hemoglobin A1c 4.6 - 6.5 % - - 6.0  - 5.9   Bicarbonate 20.0 - 28.0 mmol/L 22.2  - - - -  TCO2 22 - 32 mmol/L 23  - - - -  Acid-base deficit 0.0 - 2.0 mmol/L 3.0  - - - -  O2 Saturation % 90.0  - - - -    Capillary Blood Glucose: Lab Results  Component Value Date   GLUCAP 73 03/26/2022   GLUCAP 148 (H) 03/25/2022   GLUCAP 166 (H) 03/25/2022   GLUCAP 88 03/25/2022   GLUCAP 84 03/25/2022     Pulmonary Assessment Scores:  Pulmonary Assessment Scores     Row Name 10/26/22 1135          ADL UCSD   ADL Phase Entry     SOB Score total 54       CAT Score   CAT Score 16       mMRC Score   mMRC Score 4             UCSD: Self-administered rating of dyspnea associated with activities of daily living (ADLs) 6-point scale (0 = "not at all" to 5 = "maximal or unable to do because of breathlessness")  Scoring Scores range from 0 to 120.  Minimally important difference is 5 units  CAT: CAT can identify the health impairment of COPD patients and is better correlated with disease progression.  CAT has a scoring range of zero to 40. The CAT score is classified into four groups of low (less than 10), medium (10 - 20), high (21-30) and very high (31-40) based on the impact level of disease on health status. A CAT score over 10 suggests significant symptoms.  A worsening CAT score could be explained by an exacerbation, poor medication adherence, poor inhaler technique, or progression of COPD or comorbid conditions.  CAT MCID is 2 points  mMRC: mMRC (Modified Medical Research Council) Dyspnea Scale is used to assess the degree of baseline functional disability in patients of respiratory disease due to dyspnea. No minimal important difference is established. A decrease in score of 1 point or greater is considered a positive change.   Pulmonary Function Assessment:  Pulmonary Function Assessment - 10/26/22 1109       Breath   Bilateral Breath Sounds Clear;Decreased    Shortness of Breath Limiting activity;Panic with Shortness of Breath   only panics if she doesn't have her inhaler with her            Exercise Target Goals: Exercise Program Goal: Individual exercise prescription set using results from initial 6 min walk test and THRR while considering  patient's activity barriers and safety.   Exercise Prescription Goal: Initial exercise prescription builds to 30-45 minutes a day of aerobic activity, 2-3 days per week.  Home exercise guidelines will be given to patient  during program as part of exercise prescription that the participant will acknowledge.  Activity Barriers & Risk Stratification:  Activity Barriers & Cardiac Risk Stratification - 10/26/22 1107       Activity Barriers & Cardiac Risk Stratification   Activity Barriers Deconditioning;Muscular Weakness;Shortness of Breath;Arthritis;Back Problems    Cardiac Risk Stratification Moderate  6 Minute Walk:  6 Minute Walk     Row Name 10/26/22 1201         6 Minute Walk   Phase Initial     Distance 1125 feet     Walk Time 6 minutes     # of Rest Breaks 1  2:12-3:00     MPH 2.13     METS 2.98     RPE 14.5     Perceived Dyspnea  3     VO2 Peak 10.43     Symptoms No     Resting HR 74 bpm     Resting BP 106/70     Resting Oxygen Saturation  95 %     Exercise Oxygen Saturation  during 6 min walk 84 %     Max Ex. HR 107 bpm     Max Ex. BP 110/70     2 Minute Post BP 106/60       Interval HR   1 Minute HR 83     2 Minute HR 85     3 Minute HR 90     4 Minute HR 89     5 Minute HR 107     6 Minute HR 98     2 Minute Post HR 76     Interval Heart Rate? Yes       Interval Oxygen   Interval Oxygen? Yes     Baseline Oxygen Saturation % 95 %     1 Minute Oxygen Saturation % 97 %     1 Minute Liters of Oxygen 0 L     2 Minute Oxygen Saturation % 89 %     2 Minute Liters of Oxygen 0 L     3 Minute Oxygen Saturation % 94 %  2:12 84%     3 Minute Liters of Oxygen 0 L  increased to 2L     4 Minute Oxygen Saturation % 92 %     4 Minute Liters of Oxygen 2 L     5 Minute Oxygen Saturation % 92 %     5 Minute Liters of Oxygen 2 L     6 Minute Oxygen Saturation % 89 %     6 Minute Liters of Oxygen 2 L     2 Minute Post Oxygen Saturation % 97 %     2 Minute Post Liters of Oxygen 2 L              Oxygen Initial Assessment:  Oxygen Initial Assessment - 10/26/22 1108       Home Oxygen   Home Oxygen Device Home Concentrator;Portable Concentrator;E-Tanks    Sleep  Oxygen Prescription Continuous    Liters per minute 2    Home Exercise Oxygen Prescription Pulsed    Liters per minute 2    Home Resting Oxygen Prescription None    Compliance with Home Oxygen Use Yes      Initial 6 min Walk   Oxygen Used Continuous    Liters per minute 2      Program Oxygen Prescription   Program Oxygen Prescription Continuous    Liters per minute 2      Intervention   Short Term Goals To learn and exhibit compliance with exercise, home and travel O2 prescription;To learn and understand importance of monitoring SPO2 with pulse oximeter and demonstrate accurate use of the pulse oximeter.;To learn and understand importance of maintaining oxygen saturations>88%;To learn and demonstrate proper pursed lip  breathing techniques or other breathing techniques. ;To learn and demonstrate proper use of respiratory medications    Long  Term Goals Exhibits compliance with exercise, home  and travel O2 prescription;Maintenance of O2 saturations>88%;Compliance with respiratory medication;Verbalizes importance of monitoring SPO2 with pulse oximeter and return demonstration;Exhibits proper breathing techniques, such as pursed lip breathing or other method taught during program session;Demonstrates proper use of MDI's             Oxygen Re-Evaluation:  Oxygen Re-Evaluation     Row Name 10/31/22 0848 11/23/22 1202 12/19/22 0909 01/16/23 0934 02/13/23 1206     Program Oxygen Prescription   Program Oxygen Prescription Continuous Continuous Continuous Continuous Continuous   Liters per minute 2 2 2 2 2      Home Oxygen   Home Oxygen Device Home Concentrator;Portable Concentrator;E-Tanks Home Concentrator;Portable Concentrator;E-Tanks Home Concentrator;Portable Concentrator;E-Tanks Home Concentrator;Portable Concentrator;E-Tanks Home Concentrator;Portable Concentrator;E-Tanks   Sleep Oxygen Prescription Continuous Continuous Continuous Continuous Continuous   Liters per minute 2 2 2 2 2     Home Exercise Oxygen Prescription Pulsed Pulsed Pulsed Pulsed Pulsed   Liters per minute 2 2 2 2 2    Home Resting Oxygen Prescription None None None None None   Compliance with Home Oxygen Use Yes Yes Yes Yes Yes     Goals/Expected Outcomes   Short Term Goals To learn and exhibit compliance with exercise, home and travel O2 prescription;To learn and understand importance of monitoring SPO2 with pulse oximeter and demonstrate accurate use of the pulse oximeter.;To learn and understand importance of maintaining oxygen saturations>88%;To learn and demonstrate proper pursed lip breathing techniques or other breathing techniques. ;To learn and demonstrate proper use of respiratory medications To learn and exhibit compliance with exercise, home and travel O2 prescription;To learn and understand importance of monitoring SPO2 with pulse oximeter and demonstrate accurate use of the pulse oximeter.;To learn and understand importance of maintaining oxygen saturations>88%;To learn and demonstrate proper pursed lip breathing techniques or other breathing techniques. ;To learn and demonstrate proper use of respiratory medications To learn and exhibit compliance with exercise, home and travel O2 prescription;To learn and understand importance of monitoring SPO2 with pulse oximeter and demonstrate accurate use of the pulse oximeter.;To learn and understand importance of maintaining oxygen saturations>88%;To learn and demonstrate proper pursed lip breathing techniques or other breathing techniques. ;To learn and demonstrate proper use of respiratory medications To learn and exhibit compliance with exercise, home and travel O2 prescription;To learn and understand importance of monitoring SPO2 with pulse oximeter and demonstrate accurate use of the pulse oximeter.;To learn and understand importance of maintaining oxygen saturations>88%;To learn and demonstrate proper pursed lip breathing techniques or other breathing  techniques. ;To learn and demonstrate proper use of respiratory medications To learn and exhibit compliance with exercise, home and travel O2 prescription;To learn and understand importance of monitoring SPO2 with pulse oximeter and demonstrate accurate use of the pulse oximeter.;To learn and understand importance of maintaining oxygen saturations>88%;To learn and demonstrate proper pursed lip breathing techniques or other breathing techniques. ;To learn and demonstrate proper use of respiratory medications   Long  Term Goals Exhibits compliance with exercise, home  and travel O2 prescription;Maintenance of O2 saturations>88%;Compliance with respiratory medication;Verbalizes importance of monitoring SPO2 with pulse oximeter and return demonstration;Exhibits proper breathing techniques, such as pursed lip breathing or other method taught during program session;Demonstrates proper use of MDI's Exhibits compliance with exercise, home  and travel O2 prescription;Maintenance of O2 saturations>88%;Compliance with respiratory medication;Verbalizes importance of monitoring SPO2 with pulse oximeter and  return demonstration;Exhibits proper breathing techniques, such as pursed lip breathing or other method taught during program session;Demonstrates proper use of MDI's Exhibits compliance with exercise, home  and travel O2 prescription;Maintenance of O2 saturations>88%;Compliance with respiratory medication;Verbalizes importance of monitoring SPO2 with pulse oximeter and return demonstration;Exhibits proper breathing techniques, such as pursed lip breathing or other method taught during program session;Demonstrates proper use of MDI's Exhibits compliance with exercise, home  and travel O2 prescription;Maintenance of O2 saturations>88%;Compliance with respiratory medication;Verbalizes importance of monitoring SPO2 with pulse oximeter and return demonstration;Exhibits proper breathing techniques, such as pursed lip breathing or  other method taught during program session;Demonstrates proper use of MDI's Exhibits compliance with exercise, home  and travel O2 prescription;Maintenance of O2 saturations>88%;Compliance with respiratory medication;Verbalizes importance of monitoring SPO2 with pulse oximeter and return demonstration;Exhibits proper breathing techniques, such as pursed lip breathing or other method taught during program session;Demonstrates proper use of MDI's   Goals/Expected Outcomes Compliance and understanding of oxygen saturations monitoring and breathing techniques to decrease shortness of breath. Compliance and understanding of oxygen saturations monitoring and breathing techniques to decrease shortness of breath. Compliance and understanding of oxygen saturations monitoring and breathing techniques to decrease shortness of breath. Compliance and understanding of oxygen saturations monitoring and breathing techniques to decrease shortness of breath. Compliance and understanding of oxygen saturations monitoring and breathing techniques to decrease shortness of breath.    Row Name 03/20/23 0906             Program Oxygen Prescription   Program Oxygen Prescription Continuous       Liters per minute 2         Home Oxygen   Home Oxygen Device Home Concentrator;Portable Concentrator;E-Tanks       Sleep Oxygen Prescription Continuous       Liters per minute 2       Home Exercise Oxygen Prescription Pulsed       Liters per minute 2       Home Resting Oxygen Prescription None       Compliance with Home Oxygen Use Yes         Goals/Expected Outcomes   Short Term Goals To learn and exhibit compliance with exercise, home and travel O2 prescription;To learn and understand importance of monitoring SPO2 with pulse oximeter and demonstrate accurate use of the pulse oximeter.;To learn and understand importance of maintaining oxygen saturations>88%;To learn and demonstrate proper pursed lip breathing techniques or other  breathing techniques. ;To learn and demonstrate proper use of respiratory medications       Long  Term Goals Exhibits compliance with exercise, home  and travel O2 prescription;Maintenance of O2 saturations>88%;Compliance with respiratory medication;Verbalizes importance of monitoring SPO2 with pulse oximeter and return demonstration;Exhibits proper breathing techniques, such as pursed lip breathing or other method taught during program session;Demonstrates proper use of MDI's       Goals/Expected Outcomes Compliance and understanding of oxygen saturations monitoring and breathing techniques to decrease shortness of breath.                Oxygen Discharge (Final Oxygen Re-Evaluation):  Oxygen Re-Evaluation - 03/20/23 0906       Program Oxygen Prescription   Program Oxygen Prescription Continuous    Liters per minute 2      Home Oxygen   Home Oxygen Device Home Concentrator;Portable Concentrator;E-Tanks    Sleep Oxygen Prescription Continuous    Liters per minute 2    Home Exercise Oxygen Prescription Pulsed    Liters per minute  2    Home Resting Oxygen Prescription None    Compliance with Home Oxygen Use Yes      Goals/Expected Outcomes   Short Term Goals To learn and exhibit compliance with exercise, home and travel O2 prescription;To learn and understand importance of monitoring SPO2 with pulse oximeter and demonstrate accurate use of the pulse oximeter.;To learn and understand importance of maintaining oxygen saturations>88%;To learn and demonstrate proper pursed lip breathing techniques or other breathing techniques. ;To learn and demonstrate proper use of respiratory medications    Long  Term Goals Exhibits compliance with exercise, home  and travel O2 prescription;Maintenance of O2 saturations>88%;Compliance with respiratory medication;Verbalizes importance of monitoring SPO2 with pulse oximeter and return demonstration;Exhibits proper breathing techniques, such as pursed lip  breathing or other method taught during program session;Demonstrates proper use of MDI's    Goals/Expected Outcomes Compliance and understanding of oxygen saturations monitoring and breathing techniques to decrease shortness of breath.             Initial Exercise Prescription:  Initial Exercise Prescription - 10/26/22 1200       Date of Initial Exercise RX and Referring Provider   Date 10/26/22    Referring Provider [name]    Expected Discharge Date 01/19/23      Oxygen   Oxygen Continuous    Liters 2    Maintain Oxygen Saturation 88% or higher      Treadmill   MPH 1.7    Grade 0    Minutes 15      Recumbant Elliptical   Level 1    Minutes 15    METs 1.5      Prescription Details   Frequency (times per week) 2    Duration Progress to 30 minutes of continuous aerobic without signs/symptoms of physical distress      Intensity   THRR 40-80% of Max Heartrate 66-131    Ratings of Perceived Exertion 11-13    Perceived Dyspnea 0-4      Progression   Progression Continue progressive overload as per policy without signs/symptoms or physical distress.      Resistance Training   Training Prescription Yes    Weight blue bands    Reps 10-15             Perform Capillary Blood Glucose checks as needed.  Exercise Prescription Changes:   Exercise Prescription Changes     Row Name 11/01/22 1200 11/15/22 1100 11/24/22 0941 12/08/22 1152 12/22/22 1158     Response to Exercise   Blood Pressure (Admit) 110/77 104/70 117/75 104/60 128/96   Blood Pressure (Exercise) 109/83 112/70 -- -- --   Blood Pressure (Exit) 97/67 104/68 96/58 100/64 108/62   Heart Rate (Admit) 77 bpm 80 bpm 81 bpm 77 bpm 72 bpm   Heart Rate (Exercise) 104 bpm 98 bpm 97 bpm 92 bpm 91 bpm   Heart Rate (Exit) 83 bpm 91 bpm 91 bpm 81 bpm 79 bpm   Oxygen Saturation (Admit) 99 % 97 % 95 % 98 % 97 %   Oxygen Saturation (Exercise) 96 % 93 % 91 % 95 % 98 %   Oxygen Saturation (Exit) 97 % 97 % 97 % 96 %  97 %   Rating of Perceived Exertion (Exercise) 13 13 12.5 12 13    Perceived Dyspnea (Exercise) 2 2 3 2 2    Duration Continue with 30 min of aerobic exercise without signs/symptoms of physical distress. Continue with 30 min of aerobic exercise without signs/symptoms of  physical distress. Continue with 30 min of aerobic exercise without signs/symptoms of physical distress. Continue with 30 min of aerobic exercise without signs/symptoms of physical distress. Continue with 30 min of aerobic exercise without signs/symptoms of physical distress.   Intensity THRR unchanged THRR unchanged THRR unchanged THRR unchanged THRR unchanged     Progression   Progression Continue to progress workloads to maintain intensity without signs/symptoms of physical distress. Continue to progress workloads to maintain intensity without signs/symptoms of physical distress. Continue to progress workloads to maintain intensity without signs/symptoms of physical distress. Continue to progress workloads to maintain intensity without signs/symptoms of physical distress. Continue to progress workloads to maintain intensity without signs/symptoms of physical distress.     Resistance Training   Training Prescription Yes Yes Yes Yes Yes   Weight blue bands blue bands blue bands blue bands blue bands   Reps 10-15 10-15 10-15 10-15 10-15   Time 10 Minutes 10 Minutes 10 Minutes 10 Minutes 10 Minutes     Interval Training   Interval Training -- -- -- Yes Yes   Equipment -- -- -- Treadmill Treadmill   Comments -- -- -- 54min@3mph  and 2% incline, @2mph  and 0% incline 97min@2mph &0%incline, 16min@mph &2%incline     Oxygen   Oxygen Continuous Continuous Continuous Continuous Continuous   Liters 2 2 2 2 2      Treadmill   MPH 1 1.1 2.1 3 --   Grade 0 0 1 2 --   Minutes 15 15 15 15  --   METs 1.6 2.1 2.2 4.1 --     Recumbant Elliptical   Level 1 1 2 2 3    Minutes 15 15 15 15 15    METs 2.1 2.6 2.6 3 2.4     Oxygen   Maintain  Oxygen Saturation 88% or higher 88% or higher 88% or higher 88% or higher 88% or higher    Row Name 01/10/23 0900 01/24/23 1100 02/02/23 1158 02/21/23 1200 03/07/23 1100     Response to Exercise   Blood Pressure (Admit) 96/66 100/60 104/60 100/60 98/60   Blood Pressure (Exercise) -- 118/64 -- 142/82 134/78   Blood Pressure (Exit) 94/68 96/58 94/68  92/62 90/64   Heart Rate (Admit) 73 bpm 65 bpm 75 bpm 76 bpm 67 bpm   Heart Rate (Exercise) 106 bpm 86 bpm 97 bpm 110 bpm 95 bpm   Heart Rate (Exit) 84 bpm 72 bpm 77 bpm 90 bpm 97 bpm   Oxygen Saturation (Admit) 100 % 98 % 97 % 97 % 98 %   Oxygen Saturation (Exercise) 92 % 95 % 95 % 92 % 93 %   Oxygen Saturation (Exit) 98 % 98 % 98 % 97 % 97 %   Rating of Perceived Exertion (Exercise) 12.5 13 13  12.5 13   Perceived Dyspnea (Exercise) 2 1 2 2 2    Duration Continue with 30 min of aerobic exercise without signs/symptoms of physical distress. Continue with 30 min of aerobic exercise without signs/symptoms of physical distress. Continue with 30 min of aerobic exercise without signs/symptoms of physical distress. Continue with 30 min of aerobic exercise without signs/symptoms of physical distress. Continue with 30 min of aerobic exercise without signs/symptoms of physical distress.   Intensity THRR unchanged THRR unchanged THRR unchanged THRR unchanged THRR unchanged     Progression   Progression Continue to progress workloads to maintain intensity without signs/symptoms of physical distress. Continue to progress workloads to maintain intensity without signs/symptoms of physical distress. Continue to progress workloads to maintain  intensity without signs/symptoms of physical distress. Continue to progress workloads to maintain intensity without signs/symptoms of physical distress. Continue to progress workloads to maintain intensity without signs/symptoms of physical distress.     Resistance Training   Training Prescription Yes Yes Yes Yes Yes   Weight  blue bands blue bands blue bands blue bands blue bands   Reps 10-15 10-15 10-15 10-15 10-15   Time 10 Minutes 10 Minutes 10 Minutes 10 Minutes 10 Minutes     Interval Training   Interval Training Yes -- Yes Yes Yes   Equipment Treadmill -- Treadmill Treadmill Treadmill   Comments 23min@1 .4mph&0%incline, 70min@3mph &2%incline -- 69min@1 .34moh&0%incline, 22min1.8mph&3%incline -- 67min@2mph &3%incline METS3.2, 33min1.8mph&0%incline, METS2.2     Oxygen   Oxygen Continuous Continuous Continuous Continuous Continuous   Liters 2 2 2 2 2      Treadmill   MPH 2 1.8 -- 2 2   Grade 3 0 -- 3 3   Minutes 15 15 -- 15 15   METs 4.12 2.2 -- 3.36 3.36     Recumbant Elliptical   Level 3 4 4 3 4    RPM -- 50 -- 58 --   Watts -- -- -- 78 --   Minutes 15 15 15 15 15    METs 2.7 2.9 3.5 3.6 3.8     Oxygen   Maintain Oxygen Saturation 88% or higher 88% or higher 88% or higher 88% or higher 88% or higher    Row Name 03/21/23 1100             Response to Exercise   Blood Pressure (Admit) 96/56       Blood Pressure (Exercise) 118/54       Blood Pressure (Exit) 90/56       Heart Rate (Admit) 67 bpm       Heart Rate (Exercise) 99 bpm       Heart Rate (Exit) 82 bpm       Oxygen Saturation (Admit) 96 %       Oxygen Saturation (Exercise) 93 %       Oxygen Saturation (Exit) 96 %       Rating of Perceived Exertion (Exercise) 13       Perceived Dyspnea (Exercise) 2       Duration Continue with 30 min of aerobic exercise without signs/symptoms of physical distress.       Intensity THRR unchanged         Progression   Progression Continue to progress workloads to maintain intensity without signs/symptoms of physical distress.         Resistance Training   Training Prescription Yes       Weight blue bands       Reps 10-15       Time 10 Minutes         Interval Training   Interval Training Yes       Equipment Treadmill       Comments 52min@2 .53mph&3%incline METS 3.6, 70min@1 .83mph&0%incline METS 2.2          Oxygen   Oxygen Continuous       Liters 2         Recumbant Elliptical   Level 4       Minutes 15       METs 4         Oxygen   Maintain Oxygen Saturation 88% or higher                Exercise Comments:   Exercise Comments  Row Name 11/01/22 1208           Exercise Comments Pt completed 1st day of exercise. She exercised for 15 min on the treadmill and recumbent elliptical. She averaged 1.6 METs at 1 mph and 0 incline on the treadmill and 2.1 METs at level 1 on the recumbent elliptical. Chelsee performed the warmup and cooldown standing without limitations. Daveda did not tolerate the treadmill well and will be transfered to the track. Pt understands METs from previous participating.                Exercise Goals and Review:   Exercise Goals     Row Name 10/26/22 1107             Exercise Goals   Increase Physical Activity Yes       Intervention Provide advice, education, support and counseling about physical activity/exercise needs.;Develop an individualized exercise prescription for aerobic and resistive training based on initial evaluation findings, risk stratification, comorbidities and participant's personal goals.       Expected Outcomes Short Term: Attend rehab on a regular basis to increase amount of physical activity.;Long Term: Add in home exercise to make exercise part of routine and to increase amount of physical activity.;Long Term: Exercising regularly at least 3-5 days a week.       Increase Strength and Stamina Yes       Intervention Provide advice, education, support and counseling about physical activity/exercise needs.;Develop an individualized exercise prescription for aerobic and resistive training based on initial evaluation findings, risk stratification, comorbidities and participant's personal goals.       Expected Outcomes Short Term: Increase workloads from initial exercise prescription for resistance, speed, and METs.;Short Term:  Perform resistance training exercises routinely during rehab and add in resistance training at home;Long Term: Improve cardiorespiratory fitness, muscular endurance and strength as measured by increased METs and functional capacity ( )       Able to understand and use rate of perceived exertion (RPE) scale Yes       Intervention Provide education and explanation on how to use RPE scale       Expected Outcomes Short Term: Able to use RPE daily in rehab to express subjective intensity level;Long Term:  Able to use RPE to guide intensity level when exercising independently       Able to understand and use Dyspnea scale Yes       Intervention Provide education and explanation on how to use Dyspnea scale       Expected Outcomes Short Term: Able to use Dyspnea scale daily in rehab to express subjective sense of shortness of breath during exertion;Long Term: Able to use Dyspnea scale to guide intensity level when exercising independently       Knowledge and understanding of Target Heart Rate Range (THRR) Yes       Intervention Provide education and explanation of THRR including how the numbers were predicted and where they are located for reference       Expected Outcomes Short Term: Able to state/look up THRR;Long Term: Able to use THRR to govern intensity when exercising independently;Short Term: Able to use daily as guideline for intensity in rehab       Understanding of Exercise Prescription Yes       Intervention Provide education, explanation, and written materials on patient's individual exercise prescription       Expected Outcomes Short Term: Able to explain program exercise prescription;Long Term: Able to explain home exercise prescription to  exercise independently                Exercise Goals Re-Evaluation :  Exercise Goals Re-Evaluation     Row Name 10/31/22 0844 11/23/22 1159 12/19/22 0906 01/16/23 0922 02/13/23 1202     Exercise Goal Re-Evaluation   Exercise Goals Review Increase  Physical Activity;Able to understand and use Dyspnea scale;Understanding of Exercise Prescription;Increase Strength and Stamina;Knowledge and understanding of Target Heart Rate Range (THRR);Able to understand and use rate of perceived exertion (RPE) scale Increase Physical Activity;Able to understand and use Dyspnea scale;Understanding of Exercise Prescription;Increase Strength and Stamina;Knowledge and understanding of Target Heart Rate Range (THRR);Able to understand and use rate of perceived exertion (RPE) scale Increase Physical Activity;Able to understand and use Dyspnea scale;Understanding of Exercise Prescription;Increase Strength and Stamina;Knowledge and understanding of Target Heart Rate Range (THRR);Able to understand and use rate of perceived exertion (RPE) scale Increase Physical Activity;Able to understand and use Dyspnea scale;Understanding of Exercise Prescription;Increase Strength and Stamina;Knowledge and understanding of Target Heart Rate Range (THRR);Able to understand and use rate of perceived exertion (RPE) scale Increase Physical Activity;Able to understand and use Dyspnea scale;Understanding of Exercise Prescription;Increase Strength and Stamina;Knowledge and understanding of Target Heart Rate Range (THRR);Able to understand and use rate of perceived exertion (RPE) scale   Comments Breyona is scheduled to begin Pulmonary Rehab again on 10/29. Will continue to monitor and progress as able. Saarah has completed 6 exercise sessions. She exercises for 15 min on the recumbent elliptical and treadmill. She averages 2.6 METs at level 2 on the recumbent elliptical and 2.1 METs at 1.7 mph on the treadmill. She performs the warmup and cooldown standing without limitations. Misako has increased her workload for the recumbent elliptical as METs have remained the same. Charles has also progressed to treadmill walking. She did not tolerate treadmill walking on her first day of exercise but does now. Will  continue to monitor and progress as able. Nalaysia has completed 9 exercise sessions. She exercises for 15 min on the recumbent elliptical and treadmill. She averages 3 METs at level 2 on the recumbent elliptical and 2.2 METs on the treadmill. She performs the warmup and cooldown standing without limitations. Evalynne has progressed to interval training on the treadmill. She walks at 1.8 mph for 2 min and 2 mph and 3% incline for 4 min. Amar tolerates interval walking so far. Will increased level on recumbent elliptical. Will continue to monitor and progress as able. Shanedra has completed 14 exercise sessions. She exercises for 15 min on the recumbent elliptical and treadmill. She averages 2.7 METs at level 3 on the recumbent elliptical and 3.2 METs on the treadmill. She performs the warmup and cooldown standing without limitations. She has increased her interval training. Low speed and incline is 3 mph with 2% incline and 2 mph with 1.5 incine. Davona has not increased her recumbent elliptical level. Will encourage her to increase recumbent elliptical level. Will continue to monitor and progress as able. Tilly has completed 20 exercise sessions. She exercises for 15 min on the recumbent elliptical and treadmill. She averages 3.5 METs at level 4 on the recumbent elliptical and 2.9 METs on the treadmill. She performs the warmup and cooldown standing without limitations. Lindie has increased her level on the recumbent elliptical. She tolerates this progression wel. Her treadmill speed has also slightly increased. I asked Rosy if she wanted to discuss home exercise. Trinka feels she has all the resources she needs to exercise at home. Will continue to  monitor and progress as able.   Expected Outcomes Through exercise at rehab and home, the patient will decrease shortness of breath with daily activities and feel confident in carrying out an exercise regimen at home. Through exercise at rehab and home, the patient will  decrease shortness of breath with daily activities and feel confident in carrying out an exercise regimen at home. Through exercise at rehab and home, the patient will decrease shortness of breath with daily activities and feel confident in carrying out an exercise regimen at home. Through exercise at rehab and home, the patient will decrease shortness of breath with daily activities and feel confident in carrying out an exercise regimen at home. Through exercise at rehab and home, the patient will decrease shortness of breath with daily activities and feel confident in carrying out an exercise regimen at home.    Row Name 03/20/23 0903             Exercise Goal Re-Evaluation   Exercise Goals Review Increase Physical Activity;Able to understand and use Dyspnea scale;Understanding of Exercise Prescription;Increase Strength and Stamina;Knowledge and understanding of Target Heart Rate Range (THRR);Able to understand and use rate of perceived exertion (RPE) scale       Comments Shalika has completed 27 exercise sessions. She exercises for 15 min on the recumbent elliptical and treadmill. She averages 3.8 METs at level 4 on the recumbent elliptical and 3.6 METs on the treadmill. She performs the warmup and cooldown standing without limitations. Maeli has increased her speed and incline on the treadmill as her intervals have changed. Cade does intervals on certain days. Will continue to monitor and progress as able.       Expected Outcomes Through exercise at rehab and home, the patient will decrease shortness of breath with daily activities and feel confident in carrying out an exercise regimen at home.                Discharge Exercise Prescription (Final Exercise Prescription Changes):  Exercise Prescription Changes - 03/21/23 1100       Response to Exercise   Blood Pressure (Admit) 96/56    Blood Pressure (Exercise) 118/54    Blood Pressure (Exit) 90/56    Heart Rate (Admit) 67 bpm     Heart Rate (Exercise) 99 bpm    Heart Rate (Exit) 82 bpm    Oxygen Saturation (Admit) 96 %    Oxygen Saturation (Exercise) 93 %    Oxygen Saturation (Exit) 96 %    Rating of Perceived Exertion (Exercise) 13    Perceived Dyspnea (Exercise) 2    Duration Continue with 30 min of aerobic exercise without signs/symptoms of physical distress.    Intensity THRR unchanged      Progression   Progression Continue to progress workloads to maintain intensity without signs/symptoms of physical distress.      Resistance Training   Training Prescription Yes    Weight blue bands    Reps 10-15    Time 10 Minutes      Interval Training   Interval Training Yes    Equipment Treadmill    Comments 27min@2 .77mph&3%incline METS 3.6, 46min@1 .26mph&0%incline METS 2.2      Oxygen   Oxygen Continuous    Liters 2      Recumbant Elliptical   Level 4    Minutes 15    METs 4      Oxygen   Maintain Oxygen Saturation 88% or higher  Nutrition:  Target Goals: Understanding of nutrition guidelines, daily intake of sodium 1500mg , cholesterol 200mg , calories 30% from fat and 7% or less from saturated fats, daily to have 5 or more servings of fruits and vegetables.  Biometrics:    Nutrition Therapy Plan and Nutrition Goals:  Nutrition Therapy & Goals - 03/13/23 1439       Nutrition Therapy   Diet General Healthy Diet      Personal Nutrition Goals   Nutrition Goal Patient to improve dietary quality by using the plate method as a daily guide for meal planning to include lean protein/plant protein, fruits, vegetables, whole grains, and nonfat/low fat dairy as part of well balanced diet   goal in progress.   Personal Goal #2 Patient to identify strategies for weight loss of 0.5-2.0# per week of weight loss.   goal not met.   Comments Goals in progress. Weight loss goal not met. Javonne has medical history of pulmonary HTN, COPD3, chronic respiratory failure. Per [facility] transplant RD  documentation on 10/05/22, it is recommended that she lose weight to BMI 30/170# and ultimately BMI 27/150#. She does report history of over snacking on refined carbohydates, snacking in the middle of the night, etc. She has previously completed pulmonary rehab in February 2024; her starting weight at that time was 69.8kg/153.6#. She has maintained weight over the last ~5 months. She has previously used MyFitness Pal app to aid with tracking calories. She has maintained her weight since starting with our program. She does acknowledge some emotional eating/binge type eating tendencies; she has started seeing behavioral health counselor.  Christyna would continue to benefit from weight loss and decrease intake of refined carbohydrates to support pulmonary disease.      Intervention Plan   Intervention Prescribe, educate and counsel regarding individualized specific dietary modifications aiming towards targeted core components such as weight, hypertension, lipid management, diabetes, heart failure and other comorbidities.;Nutrition handout(s) given to patient.    Expected Outcomes Short Term Goal: Understand basic principles of dietary content, such as calories, fat, sodium, cholesterol and nutrients.;Long Term Goal: Adherence to prescribed nutrition plan.             Nutrition Assessments:  MEDIFICTS Score Key: >=70 Need to make dietary changes  40-70 Heart Healthy Diet <= 40 Therapeutic Level Cholesterol Diet  Flowsheet Row PULMONARY REHAB CHRONIC OBSTRUCTIVE PULMONARY DISEASE from 02/22/2022 in [facility]  Picture Your Plate Total Score on Discharge 50      Picture Your Plate Scores: <16 Unhealthy dietary pattern with much room for improvement. 41-50 Dietary pattern unlikely to meet recommendations for good health and room for improvement. 51-60 More healthful dietary pattern, with some room for improvement.  >60 Healthy dietary  pattern, although there may be some specific behaviors that could be improved.    Nutrition Goals Re-Evaluation:  Nutrition Goals Re-Evaluation     Row Name 11/01/22 1307 11/29/22 1532 03/13/23 1439         Goals   Current Weight 177 lb 14.6 oz (80.7 kg) 177 lb 14.6 oz (80.7 kg) 178 lb 5.6 oz (80.9 kg)     Comment LDL 134, cholesterol 208, X0R 5.8 A1c 5.9; other most recent labs  LDL 134, cholesterol 208 A1c 5.9; other most recent labs LDL 134, cholesterol 208     Expected Outcome Robbyn has medical history of pulmonary HTN, COPD3, chronic respiratory failure. Per [facility] transplant RD documentation on 10/05/22, it is recommended that  she lose weight to BMI 30/170# and ultimately BMI 27/150#. She does report history of over snacking on refined carbohydates, snacking in the middle of the night, etc. She has previously completed pulmonary rehab in February 2024; her starting weight at that time was 69.8kg/153.6#. She reports using MyFitness Pal app to aid with tracking calories with goal set at ~1200kcals per patient. Nickayla would continue to benefit from weight loss and decrease intake of refined carbohydrates to support pulmonary disease. Goals in progress. Penelopi has medical history of pulmonary HTN, COPD3, chronic respiratory failure. Per [facility] transplant RD documentation on 10/05/22, it is recommended that she lose weight to BMI 30/170# and ultimately BMI 27/150#. She does report history of over snacking on refined carbohydates, snacking in the middle of the night, etc. She has previously completed pulmonary rehab in February 2024; her starting weight at that time was 69.8kg/153.6#. She reports using MyFitness Pal app to aid with tracking calories with goal set at ~1200kcals per patient. She has maintained her weight since starting with our program. She does acknowledge some emotional eating/binge type eating tendencies; she will begin seeing a behavioral health counselor. Sheniqua would continue to benefit  from weight loss and decrease intake of refined carbohydrates to support pulmonary disease. Goals in progress. Weight loss goal not met. Chavela has medical history of pulmonary HTN, COPD3, chronic respiratory failure. Per [facility] transplant RD documentation on 10/05/22, it is recommended that she lose weight to BMI 30/170# and ultimately BMI 27/150#. She does report history of over snacking on refined carbohydates, snacking in the middle of the night, etc. She has previously completed pulmonary rehab in February 2024; her starting weight at that time was 69.8kg/153.6#. She has maintained weight over the last ~5 months. She has previously used MyFitness Pal app to aid with tracking calories. She has maintained her weight since starting with our program. She does acknowledge some emotional eating/binge type eating tendencies; she has started seeing behavioral health counselor. Cornelia would continue to benefit from weight loss and decrease intake of refined carbohydrates to support pulmonary disease.              Nutrition Goals Discharge (Final Nutrition Goals Re-Evaluation):  Nutrition Goals Re-Evaluation - 03/13/23 1439       Goals   Current Weight 178 lb 5.6 oz (80.9 kg)    Comment A1c 5.9; other most recent labs LDL 134, cholesterol 208    Expected Outcome Goals in progress. Weight loss goal not met. Evalyne has medical history of pulmonary HTN, COPD3, chronic respiratory failure. Per [facility] transplant RD documentation on 10/05/22, it is recommended that she lose weight to BMI 30/170# and ultimately BMI 27/150#. She does report history of over snacking on refined carbohydates, snacking in the middle of the night, etc. She has previously completed pulmonary rehab in February 2024; her starting weight at that time was 69.8kg/153.6#. She has maintained weight over the last ~5 months. She has previously used MyFitness Pal app to aid with tracking calories. She has maintained her weight since starting with our  program. She does acknowledge some emotional eating/binge type eating tendencies; she has started seeing behavioral health counselor. Veroncia would continue to benefit from weight loss and decrease intake of refined carbohydrates to support pulmonary disease.             Psychosocial: Target Goals: Acknowledge presence or absence of significant depression and/or stress, maximize coping skills, provide positive support system. Participant is able to verbalize types and ability to use  techniques and skills needed for reducing stress and depression.  Initial Review & Psychosocial Screening:  Initial Psych Review & Screening - 10/26/22 1103       Initial Review   Current issues with Current Depression;Current Anxiety/Panic;History of Depression;Current Stress Concerns    Comments Pt is stressed about her health and the ability to not work anymore.      Family Dynamics   Good Support System? Yes    Comments Husband, Alessandra Bevels, 2 daughters      Barriers   Psychosocial barriers to participate in program Psychosocial barriers identified (see note);The patient should benefit from training in stress management and relaxation.      Screening Interventions   Interventions Encouraged to exercise;To provide support and resources with identified psychosocial needs    Expected Outcomes Short Term goal: Utilizing psychosocial counselor, staff and physician to assist with identification of specific Stressors or current issues interfering with healing process. Setting desired goal for each stressor or current issue identified.;Long Term Goal: Stressors or current issues are controlled or eliminated.;Short Term goal: Identification and review with participant of any Quality of Life or Depression concerns found by scoring the questionnaire.;Long Term goal: The participant improves quality of Life and PHQ9 Scores as seen by post scores and/or verbalization of changes             Quality of Life  Scores:  Scores of 19 and below usually indicate a poorer quality of life in these areas.  A difference of  2-3 points is a clinically meaningful difference.  A difference of 2-3 points in the total score of the Quality of Life Index has been associated with significant improvement in overall quality of life, self-image, physical symptoms, and general health in studies assessing change in quality of life.  PHQ-9: Review Flowsheet  More data exists      03/16/2023 03/01/2023 02/15/2023 01/18/2023 11/28/2022  Depression screen PHQ 2/9  Decreased Interest     0   Down, Depressed, Hopeless     0   PHQ - 2 Score     0   Altered sleeping       Tired, decreased energy       Change in appetite       Feeling bad or failure about yourself        Trouble concentrating       Moving slowly or fidgety/restless       Suicidal thoughts       PHQ-9 Score       Difficult doing work/chores         Details       Information is confidential and restricted. Go to Review Flowsheets to unlock data.   Multiple values from one day are sorted in reverse-chronological order        Interpretation of Total Score  Total Score Depression Severity:  1-4 = Minimal depression, 5-9 = Mild depression, 10-14 = Moderate depression, 15-19 = Moderately severe depression, 20-27 = Severe depression   Psychosocial Evaluation and Intervention:  Psychosocial Evaluation - 10/26/22 1105       Psychosocial Evaluation & Interventions   Interventions Stress management education;Encouraged to exercise with the program and follow exercise prescription    Comments Tylan is currently stressed about her health and not being able to work anymore. 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 She has finally been approved for disability so this has helped to decrease some of her stress. She  is currently being worked up for a lung transplant at [facility]. She does admit to feeling depressed, but states she  is getting ready to start therapy. She is also going to ask her therapist about starting psychotropic meds.    Expected Outcomes For Delcenia to participate in PR free of any psychosocial barreris or concerns    Continue Psychosocial Services  Follow up required by staff             Psychosocial Re-Evaluation:  Psychosocial Re-Evaluation     Row Name 10/28/22 1521 11/23/22 1130 12/16/22 0924 01/18/23 1535 02/15/23 1034     Psychosocial Re-Evaluation   Current issues with Current Stress Concerns;Current Anxiety/Panic;History of Depression;Current Depression Current Stress Concerns;Current Anxiety/Panic;History of Depression;Current Depression Current Depression;History of Depression;Current Psychotropic Meds;Current Anxiety/Panic;Current Stress Concerns Current Depression;History of Depression;Current Psychotropic Meds;Current Anxiety/Panic;Current Stress Concerns Current Depression;History of Depression;Current Psychotropic Meds;Current Anxiety/Panic;Current Stress Concerns   Comments No changes since orientation on 10/26/22. Karrissa is scheduled to start PR on 10/29 Thresa has met with her psychiatrist and has also been referred to a counseler. No needs at this time. Kamira denies any new psychosocial barriers or concerns. She has started taking psychotropic meds to help with her anxiety and depression. She is also working with a therapist to help her navigate her feelings of worry/anger. Keanu is currently struggling with making the decision to go through with an endobronchial valve replacement. She states her husband does not want her to due to the risk associated with the procedure. This decision she has to make has increased her stress level. She is still working with her mental health therapist and taking her psychotropic meds. She states she feels her depression is a little better. Suezette has made the decision not to go through with the endobronchial valve replacement. She felt the risk  outweighed the benefits. She has peace about this decision. She is going to go through with the lung transplant evaluation which she is hopeful about. She feels her depression is stable at this time. She is compliant with taking her psychotropic meds and meeting with her mental health specialist.  No new barriers or concerns at this time.   Expected Outcomes For Yumiko to attend the program without psychosocial issues or concerns. For Sande to attend the program with less psychosocial issues or concerns. For Yaniyah to attend the program with less psychosocial issues or concerns. For Shanda to attend the program with less psychosocial issues or concerns. For Makhayla to attend the program with less psychosocial issues or concerns.   Interventions Encouraged to attend Pulmonary Rehabilitation for the exercise;Stress management education;Relaxation education Encouraged to attend Pulmonary Rehabilitation for the exercise Encouraged to attend Pulmonary Rehabilitation for the exercise;Stress management education Encouraged to attend Pulmonary Rehabilitation for the exercise;Stress management education Encouraged to attend Pulmonary Rehabilitation for the exercise   Continue Psychosocial Services  Follow up required by staff Follow up required by staff Follow up required by staff Follow up required by staff Follow up required by staff    Row Name 03/15/23 1118             Psychosocial Re-Evaluation   Current issues with Current Depression;History of Depression;Current Psychotropic Meds;Current Stress Concerns       Comments Evonda denies any new psychosocial barriers or concerns at this time. She is still working with her therapist and taking her psychotropic meds.       Expected Outcomes For Baker to attend the program with less psychosocial issues or concerns.  Interventions Encouraged to attend Pulmonary Rehabilitation for the exercise       Continue Psychosocial Services  No Follow up required                 Psychosocial Discharge (Final Psychosocial Re-Evaluation):  Psychosocial Re-Evaluation - 03/15/23 1118       Psychosocial Re-Evaluation   Current issues with Current Depression;History of Depression;Current Psychotropic Meds;Current Stress Concerns    Comments Deshonna denies any new psychosocial barriers or concerns at this time. She is still working with her therapist and taking her psychotropic meds.    Expected Outcomes For Akita to attend the program with less psychosocial issues or concerns.    Interventions Encouraged to attend Pulmonary Rehabilitation for the exercise    Continue Psychosocial Services  No Follow up required             Education: Education Goals: Education classes will be provided on a weekly basis, covering required topics. Participant will state understanding/return demonstration of topics presented.  Learning Barriers/Preferences:  Learning Barriers/Preferences - 10/26/22 1105       Learning Barriers/Preferences   Learning Barriers None    Learning Preferences Audio;Group Instruction;Individual Instruction;Verbal Instruction;Video;Written Material             Education Topics: Know Your Numbers Group instruction that is supported by a PowerPoint presentation. Instructor discusses importance of knowing and understanding resting, exercise, and post-exercise oxygen saturation, heart rate, and blood pressure. Oxygen saturation, heart rate, blood pressure, rating of perceived exertion, and dyspnea are reviewed along with a normal range for these values.  Flowsheet Row PULMONARY REHAB CHRONIC OBSTRUCTIVE PULMONARY DISEASE from 01/05/2023 in [facility] for Heart, Vascular, & Lung Health  Date 01/05/23  Educator EP  Instruction Review Code 1- Verbalizes Understanding       Exercise for the Pulmonary Patient Group instruction that is supported by a PowerPoint presentation. Instructor discusses benefits of  exercise, core components of exercise, frequency, duration, and intensity of an exercise routine, importance of utilizing pulse oximetry during exercise, safety while exercising, and options of places to exercise outside of rehab.  Flowsheet Row PULMONARY REHAB CHRONIC OBSTRUCTIVE PULMONARY DISEASE from 12/29/2022 in [facility] for Heart, Vascular, & Lung Health  Date 12/29/22  Educator EP  Instruction Review Code 1- Verbalizes Understanding       MET Level  Group instruction provided by PowerPoint, verbal discussion, and written material to support subject matter. Instructor reviews what METs are and how to increase METs.  Flowsheet Row PULMONARY REHAB CHRONIC OBSTRUCTIVE PULMONARY DISEASE from 11/24/2022 in [facility] for Heart, Vascular, & Lung Health  Date 11/24/22  Educator EP  Instruction Review Code 1- Verbalizes Understanding       Pulmonary Medications Verbally interactive group education provided by instructor with focus on inhaled medications and proper administration. Flowsheet Row PULMONARY REHAB CHRONIC OBSTRUCTIVE PULMONARY DISEASE from 12/22/2022 in [facility] for Heart, Vascular, & Lung Health  Date 12/22/22  Educator RT  Instruction Review Code 1- Verbalizes Understanding       Anatomy and Physiology of the Respiratory System Group instruction provided by PowerPoint, verbal discussion, and written material to support subject matter. Instructor reviews respiratory cycle and anatomical components of the respiratory system and their functions. Instructor also reviews differences in obstructive and restrictive respiratory diseases with examples of each.  Flowsheet Row PULMONARY REHAB CHRONIC OBSTRUCTIVE PULMONARY DISEASE from 12/15/2022 in [facility] for  Heart, Vascular, & Lung Health  Date 12/15/22  Educator RT  Instruction Review Code 1- Verbalizes Understanding        Oxygen Safety Group instruction provided by PowerPoint, verbal discussion, and written material to support subject matter. There is an overview of "What is Oxygen" and "Why do we need it".  Instructor also reviews how to create a safe environment for oxygen use, the importance of using oxygen as prescribed, and the risks of noncompliance. There is a brief discussion on traveling with oxygen and resources the patient may utilize. Flowsheet Row PULMONARY REHAB CHRONIC OBSTRUCTIVE PULMONARY DISEASE from 01/27/2022 in [facility] for Heart, Vascular, & Lung Health  Date 01/27/22  Educator RN  Instruction Review Code 1- Verbalizes Understanding       Oxygen Use Group instruction provided by PowerPoint, verbal discussion, and written material to discuss how supplemental oxygen is prescribed and different types of oxygen supply systems. Resources for more information are provided.  Flowsheet Row PULMONARY REHAB CHRONIC OBSTRUCTIVE PULMONARY DISEASE from 01/19/2023 in [facility] for Heart, Vascular, & Lung Health  Date 01/19/23  Educator RT  Instruction Review Code 1- Verbalizes Understanding       Breathing Techniques Group instruction that is supported by demonstration and informational handouts. Instructor discusses the benefits of pursed lip and diaphragmatic breathing and detailed demonstration on how to perform both.  Flowsheet Row PULMONARY REHAB CHRONIC OBSTRUCTIVE PULMONARY DISEASE from 01/26/2023 in [facility] for Heart, Vascular, & Lung Health  Date 01/26/23  Educator RN  Instruction Review Code 1- Verbalizes Understanding        Risk Factor Reduction Group instruction that is supported by a PowerPoint presentation. Instructor discusses the definition of a risk factor, different risk factors for pulmonary disease, and how the heart and lungs work together. Flowsheet Row PULMONARY REHAB CHRONIC OBSTRUCTIVE  PULMONARY DISEASE from 02/16/2023 in [facility] for Heart, Vascular, & Lung Health  Date 02/16/23  Educator EP  Instruction Review Code 1- Verbalizes Understanding       Pulmonary Diseases Group instruction provided by PowerPoint, verbal discussion, and written material to support subject matter. Instructor gives an overview of the different type of pulmonary diseases. There is also a discussion on risk factors and symptoms as well as ways to manage the diseases. Flowsheet Row PULMONARY REHAB CHRONIC OBSTRUCTIVE PULMONARY DISEASE from 03/02/2023 in [facility] for Heart, Vascular, & Lung Health  Date 03/02/23  Educator RT  Instruction Review Code 1- Verbalizes Understanding       Stress and Energy Conservation Group instruction provided by PowerPoint, verbal discussion, and written material to support subject matter. Instructor gives an overview of stress and the impact it can have on the body. Instructor also reviews ways to reduce stress. There is also a discussion on energy conservation and ways to conserve energy throughout the day. Flowsheet Row PULMONARY REHAB CHRONIC OBSTRUCTIVE PULMONARY DISEASE from 02/02/2023 in [facility] for Heart, Vascular, & Lung Health  Date 02/02/23  Educator RN  Instruction Review Code 1- Verbalizes Understanding       Warning Signs and Symptoms Group instruction provided by PowerPoint, verbal discussion, and written material to support subject matter. Instructor reviews warning signs and symptoms of stroke, heart attack, cold and flu. Instructor also reviews ways to prevent the spread of infection. Flowsheet Row PULMONARY REHAB CHRONIC OBSTRUCTIVE PULMONARY DISEASE from 11/10/2022 in [facility] for  Heart, Vascular, & Lung Health  Date 11/10/22  Educator RN  Instruction Review Code 1- Verbalizes Understanding       Other Education Group or individual  verbal, written, or video instructions that support the educational goals of the pulmonary rehab program. Flowsheet Row PULMONARY REHAB CHRONIC OBSTRUCTIVE PULMONARY DISEASE from 03/16/2023 in [facility] for Heart, Vascular, & Lung Health  Date 03/16/23  Educator EP  Instruction Review Code 1- Verbalizes Understanding        Knowledge Questionnaire Score:  Knowledge Questionnaire Score - 10/26/22 1135       Knowledge Questionnaire Score   Pre Score 17/18             Core Components/Risk Factors/Patient Goals at Admission:  Personal Goals and Risk Factors at Admission - 10/26/22 1105       Core Components/Risk Factors/Patient Goals on Admission    Weight Management Yes;Weight Loss   pt wants to lose 10lbs   Intervention Weight Management: Develop a combined nutrition and exercise program designed to reach desired caloric intake, while maintaining appropriate intake of nutrient and fiber, sodium and fats, and appropriate energy expenditure required for the weight goal.;Weight Management: Provide education and appropriate resources to help participant work on and attain dietary goals.;Weight Management/Obesity: Establish reasonable short term and long term weight goals.;Obesity: Provide education and appropriate resources to help participant work on and attain dietary goals.    Expected Outcomes Short Term: Continue to assess and modify interventions until short term weight is achieved;Long Term: Adherence to nutrition and physical activity/exercise program aimed toward attainment of established weight goal;Weight Loss: Understanding of general recommendations for a balanced deficit meal plan, which promotes 1-2 lb weight loss per week and includes a negative energy balance of [phone] kcal/d;Understanding recommendations for meals to include 15-35% energy as protein, 25-35% energy from fat, 35-60% energy from carbohydrates, less than 200mg  of dietary cholesterol,  20-35 gm of total fiber daily;Understanding of distribution of calorie intake throughout the day with the consumption of 4-5 meals/snacks    Improve shortness of breath with ADL's Yes    Intervention Provide education, individualized exercise plan and daily activity instruction to help decrease symptoms of SOB with activities of daily living.    Expected Outcomes Short Term: Improve cardiorespiratory fitness to achieve a reduction of symptoms when performing ADLs;Long Term: Be able to perform more ADLs without symptoms or delay the onset of symptoms    Stress Yes    Intervention Offer individual and/or small group education and counseling on adjustment to heart disease, stress management and health-related lifestyle change. Teach and support self-help strategies.;Refer participants experiencing significant psychosocial distress to appropriate mental health specialists for further evaluation and treatment. When possible, include family members and significant others in education/counseling sessions.    Expected Outcomes Short Term: Participant demonstrates changes in health-related behavior, relaxation and other stress management skills, ability to obtain effective social support, and compliance with psychotropic medications if prescribed.;Long Term: Emotional wellbeing is indicated by absence of clinically significant psychosocial distress or social isolation.             Core Components/Risk Factors/Patient Goals Review:   Goals and Risk Factor Review     Row Name 10/28/22 1523 11/23/22 1137 12/16/22 0928 01/18/23 1539 02/15/23 1037     Core Components/Risk Factors/Patient Goals Review   Personal Goals Review Weight Management/Obesity;Improve shortness of breath with ADL's;Develop more efficient breathing techniques such as purse lipped breathing and diaphragmatic breathing and practicing self-pacing with  activity.;Stress Weight Management/Obesity;Improve shortness of breath with ADL's;Develop  more efficient breathing techniques such as purse lipped breathing and diaphragmatic breathing and practicing self-pacing with activity. Weight Management/Obesity;Improve shortness of breath with ADL's;Develop more efficient breathing techniques such as purse lipped breathing and diaphragmatic breathing and practicing self-pacing with activity.;Stress Weight Management/Obesity;Improve shortness of breath with ADL's Weight Management/Obesity;Improve shortness of breath with ADL's;Stress   Review Unable to assess. Sae is scheduled to start PR on 11/01/22 Goal progressing for weight loss. Hillari is working with staff dietitian to achieve her weight loss goals. Goal progressing on improving shortness of breath with ADL's. Goal progressing on developing more efficient breathing techniques such as purse lipped breathing and diaphragmatic breathing; and practicing self-pacing with activity. increase knowledge or respiratory medications and ability to use respiratory devices properly. Myleka is requiring 2L of O2 to maintain sats >88%. She has also been able to transition from walking the track to walking on the treadmill. We will continue to monitor Janelli's progress throughout the program. Goal progressing for weight loss. Goal progressing on improving shortness of breath with ADL's. Goal progressing on developing more efficient breathing techniques such as purse lipped breathing and diaphragmatic breathing; and practicing self-pacing with activity. Goal met on increasing knowledge of respiratory medications and ability to use respiratory devices properly. Maire has demonstrated proper use of MDI to staff.  Goal progressing on decreasing stress. Falyn is currently working with a  therapist on her mental health issues. She has also started taking psychotropic meds. Saraphina is requiring 2L of O2 to maintain sats >88% while exercising. We will continue to monitor Zsofia's progress throughout the program. Goal  progressing for weight loss. Goal progressing on improving shortness of breath with ADL's. Goal met on developing more efficient breathing techniques such as purse lipped breathing and diaphragmatic breathing; and practicing self-pacing with activity. Shilynn is able to demonstrate purse lip breathing when she gets short of breath. Marsia is also able to pace herself based on her rate of perceived exertion scale. Goal progressing on decreasing stress.  Ardelia is requiring 2L of O2 to maintain sats >88% while exercising. We will continue to monitor Janyia's progress throughout the program. Goal progressing for weight loss. Goal progressing on improving shortness of breath with ADL's. Goal progressing on decreasing stress.  Cathaleen is requiring 2L of O2 to maintain sats >88% while exercising. She is currently exercising on the recumbant elliptical and the treadmill. We will continue to monitor Muslima's progress throughout the program.   Expected Outcomes For Maebell to lose weight, improve her SOB with ADLs, decrease stress, and develop more efficient breathing techniques such as purse lipped breathing and diaphragmatic breathing; and practicing self-pacing with activity For Tatiyanna to lose weight, improve her SOB with ADLs, decrease stress, and develop more efficient breathing techniques such as purse lipped breathing and diaphragmatic breathing; and practicing self-pacing with activity For Lucie to lose weight, improve her SOB with ADLs, decrease stress, and develop more efficient breathing techniques such as purse lipped breathing and diaphragmatic breathing; and practicing self-pacing with activity For Cherryl to lose weight, improve her SOB with ADLs and decrease stress For Brittyn to lose weight, improve her SOB with ADLs and decrease stress    Row Name 03/15/23 1120             Core Components/Risk Factors/Patient Goals Review   Personal Goals Review Weight Management/Obesity;Improve shortness of breath  with ADL's;Stress       Review Goal progressing for weight loss. Akshaya is  working with the staff dietician to obatain her weight loss goals. Goal progressing on improving shortness of breath with ADL's. Goal met on decreasing stress. She states that exercising has helped reduce her stress and she feels like her stress level is under control. Darlette is requiring 2L of O2 to maintain sats >88% while exercising. She is currently exercising on the recumbant elliptical and the treadmill. We will continue to monitor Chelby's progress throughout the program.       Expected Outcomes For Dream to lose weight and improve her SOB with ADLs                Core Components/Risk Factors/Patient Goals at Discharge (Final Review):   Goals and Risk Factor Review - 03/15/23 1120       Core Components/Risk Factors/Patient Goals Review   Personal Goals Review Weight Management/Obesity;Improve shortness of breath with ADL's;Stress    Review Goal progressing for weight loss. Carlita is working with the staff dietician to obatain her weight loss goals. Goal progressing on improving shortness of breath with ADL's. Goal met on decreasing stress. She states that exercising has helped reduce her stress and she feels like her stress level is under control. Ski is requiring 2L of O2 to maintain sats >88% while exercising. She is currently exercising on the recumbant elliptical and the treadmill. We will continue to monitor Sahiba's progress throughout the program.    Expected Outcomes For Noella to lose weight and improve her SOB with ADLs             ITP Comments:Pt is making expected progress toward Pulmonary Rehab goals after completing 28 session(s). Recommend continued exercise, life style modification, education, and utilization of breathing techniques to increase stamina and strength, while also decreasing shortness of breath with exertion.  Dr. Mechele Collin is Medical Director for Pulmonary Rehab at [facility].     Comments:
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
## 2023-03-23 ENCOUNTER — Encounter (HOSPITAL_COMMUNITY)
Admission: RE | Admit: 2023-03-23 | Discharge: 2023-03-23 | Disposition: A | Discharge status: Home / Self Care | Payer: 59 | Source: Ambulatory Visit | Attending: Internal Medicine | Admitting: Internal Medicine

## 2023-03-23 DIAGNOSIS — J449 Chronic obstructive pulmonary disease, unspecified: Secondary | ICD-10-CM | POA: Diagnosis not present

## 2023-03-23 NOTE — Progress Notes (Signed)
 Daily Session Note  Patient Details  Name: Jill Glenn MRN: [id number] Date of Birth: [birth year] Referring Provider:   Doristine Devoid Pulmonary Rehab Walk Test from 10/26/2022 in [facility]  Referring Provider [name]       Encounter Date: 03/23/2023  Check In:  Session Check In - 03/23/23 1022       Check-In   Supervising physician immediately available to respond to emergencies [facility] MD immediately available    Physician(s) Reather Littler, NP    Location [facility]    Staff Present Durel Salts, Zella Richer, MS, ACSM-CEP, Exercise Physiologist;Randi Idelle Crouch BS, ACSM-CEP, Exercise Physiologist;Samantha Belarus, RD, Dutch Gray, RN, BSN    Virtual Visit No    Fall or balance concerns reported    No    Tobacco Cessation No Change    Warm-up and Cool-down Performed as group-led Writer Performed Yes    VAD Patient? No    PAD/SET Patient? No      Pain Assessment   Currently in Pain? No/denies    Pain Score 0-No pain    Multiple Pain Sites No             Capillary Blood Glucose: No results found for this or any previous visit (from the past 24 hours).    Social History   Tobacco Use  Smoking Status Former   Current packs/day: 0.00   Average packs/day: 1 pack/day for 40.6 years (40.6 ttl pk-yrs)   Types: Cigarettes   Start date: 03/05/1981   Quit date: 10/14/2021   Years since quitting: 1.4  Smokeless Tobacco Never    Goals Met:  Independence with exercise equipment Exercise tolerated well No report of concerns or symptoms today Strength training completed today  Goals Unmet:  Not Applicable  Comments: Service time is from 1005 to 1131    Dr. Mechele Collin is Medical Director for Pulmonary Rehab at [facility].

## 2023-03-23 NOTE — Addendum Note (Signed)
 Addended by: Everlena Cooper on: 03/23/2023 09:30 AM   Modules accepted: Level of Service

## 2023-03-28 ENCOUNTER — Encounter (HOSPITAL_COMMUNITY)
Admission: RE | Admit: 2023-03-28 | Discharge: 2023-03-28 | Disposition: A | Discharge status: Home / Self Care | Source: Ambulatory Visit | Attending: Internal Medicine | Admitting: Internal Medicine

## 2023-03-28 DIAGNOSIS — J449 Chronic obstructive pulmonary disease, unspecified: Secondary | ICD-10-CM | POA: Diagnosis not present

## 2023-03-28 NOTE — Progress Notes (Signed)
 Daily Session Note  Patient Details  Name: Jill Glenn MRN: [id number] Date of Birth: [birth year] Referring Provider:   Doristine Devoid Pulmonary Rehab Walk Test from 10/26/2022 in [facility]  Referring Provider [name]       Encounter Date: 03/28/2023  Check In:  Session Check In - 03/28/23 1023       Check-In   Supervising physician immediately available to respond to emergencies [facility] MD immediately available    Physician(s) Hoover Browns, NP    Location [facility]    Staff Present Durel Salts, Zella Richer, MS, ACSM-CEP, Exercise Physiologist;Randi Dionisio Paschal, ACSM-CEP, Exercise Physiologist;Samantha Belarus, RD, Dutch Gray, RN, BSN    Virtual Visit No    Medication changes reported     No    Fall or balance concerns reported    No    Tobacco Cessation No Change    Warm-up and Cool-down Performed as group-led Writer Performed Yes    VAD Patient? No    PAD/SET Patient? No      Pain Assessment   Currently in Pain? No/denies    Multiple Pain Sites No             Capillary Blood Glucose: No results found for this or any previous visit (from the past 24 hours).    Social History   Tobacco Use  Smoking Status Former   Current packs/day: 0.00   Average packs/day: 1 pack/day for 40.6 years (40.6 ttl pk-yrs)   Types: Cigarettes   Start date: 03/05/1981   Quit date: 10/14/2021   Years since quitting: 1.4  Smokeless Tobacco Never    Goals Met:  Independence with exercise equipment Exercise tolerated well No report of concerns or symptoms today Strength training completed today  Goals Unmet:  Not Applicable  Comments: Service time is from 1004 to 1128    Dr. Mechele Collin is Medical Director for Pulmonary Rehab at [facility].

## 2023-03-29 ENCOUNTER — Ambulatory Visit (INDEPENDENT_AMBULATORY_CARE_PROVIDER_SITE_OTHER): Payer: 59 | Admitting: Licensed Clinical Social Worker

## 2023-03-29 DIAGNOSIS — F411 Generalized anxiety disorder: Secondary | ICD-10-CM | POA: Diagnosis not present

## 2023-03-29 DIAGNOSIS — F322 Major depressive disorder, single episode, severe without psychotic features: Secondary | ICD-10-CM

## 2023-03-29 DIAGNOSIS — F4310 Post-traumatic stress disorder, unspecified: Secondary | ICD-10-CM

## 2023-03-29 NOTE — Progress Notes (Signed)
 THERAPIST PROGRESS NOTE   Session Date: 03/29/2023  Session Time: 1105 - 1151 Virtual Visit via Video Note  I connected with Jill Glenn on 03/29/23 at 11:00 AM EDT by a video enabled telemedicine application and verified that I am speaking with the correct person using two identifiers.  Location: Patient: In Car, Home in driveway Provider: [facility] [place] [facility]   I discussed the limitations of evaluation and management by telemedicine and the availability of in person appointments. The patient expressed understanding and agreed to proceed.  I discussed the assessment and treatment plan with the patient. The patient was provided an opportunity to ask questions and all were answered. The patient agreed with the plan and demonstrated an understanding of the instructions.   The patient was advised to call back or seek an in-person evaluation if the symptoms worsen or if the condition fails to improve as anticipated.  I provided 45 minutes of non-face-to-face time during this encounter.  Participation Level: Active  Behavioral Response: CasualAlertAnxious, Depressed, Euthymic, and tearful  Type of Therapy: Individual Therapy  Treatment Goals addressed:  Anxiety    LTG: "Work on communicating my needs better"        Depression    LTG: Reduce frequency, intensity, and duration of depression symptoms so that daily functioning is improved      LTG: Increase coping skills to manage depression and improve ability to perform daily activities      LTG: "I just want to be happy"        ProgressTowards Goals: Progressing  Interventions: CBT, Motivational Interviewing, and Supportive  Summary: [name] is a 58 y.o. female with past psych history of MDD, GAD, and PTSD, presenting for follow-up therapy session in efforts to improve management of depressive and anxious symptoms.   Patient actively engaged in session, presenting in pleasant moods overall, with periods of  tearfulness when processing ongoing stressors.  Patient openly engaged in introductory check-in, reporting of doing well overall, confirming of having no appointments today related to chronic medical conditions, sharing of the upcoming week of 4/7 is expected to be busy with upcoming scans, and further appointments surrounding transplant procedure.  Briefly reviewed upcoming and rescheduling of appointments due to scheduling conflicts.  Patient actively engaged in providing recounts of recent events since previous session, sharing of things being pretty calm around the house and in life overall from day to day since last appt. Patient additionally reflected on having stayed consistent with taking medications regularly, which she believes to be a significant factor in contributing to improved moods.  Revisited identified benefits of journaling, with patient sharing of journaling proving to be a great benefit/support when up tight, stressed, down, or angry, sharing of finding herself using often times when angry and feeling unable to express herself, proving to be a big help.  Revisited previous stressors and/or challenges related to communication and individual abilities to openly communicate thoughts and feelings in relation to presenting medical challenges, sharing of having reached a point where she is choosing to to say "screw it, and just not dealing with certain things right now", expressing feeling as though this has helped.  Patient further detailed of feeling this approach to be helping deal with things in immediate, but also acknowledged that things will come back to surface and may prove problematic then.  Further engaged in processing challenges that accompany suppressing of feelings and emotions, identifying various factors to include increased conflict, resentment, distance within relationships, avoidance, opposition, and contempt. Processed  whether feels able to approach conversations surrounding  health with husband and daughters, but feels unable to approach factors in her time but unsure of when will be the right time. - Detailed of feeling better having allowed to have grandchildren around and enjoy their company, spending time with husband has been better, and look forward to spending afternoons with grandson. - Explored potential benefits of previously discussed suggestions surrounding utilizing journaling as a tool to further support in navigating thoughts and feelings in relation to approaching various situations.  Patient responded well to interventions. Patient continues to meet criteria for MDD, GAD, and PTSD. Patient will continue to benefit from engagement in outpatient therapy due to being the least restrictive service to meet presenting needs.      03/29/2023   11:14 AM 03/16/2023    1:14 PM 03/01/2023    1:08 PM 02/15/2023    3:12 PM 01/18/2023    1:19 PM  Depression screen PHQ 2/9  Decreased Interest 0 1 3 1 2   Down, Depressed, Hopeless 1 1 3 1 1   PHQ - 2 Score 1 2 6 2 3   Altered sleeping 0 2 1 3 3   Tired, decreased energy 0 2 3 1 2   Change in appetite 0 2 0 0 0  Feeling bad or failure about yourself  1 1 1 1 3   Trouble concentrating 1 2 0 2 0  Moving slowly or fidgety/restless 0 0 0 0 0  Suicidal thoughts 0 0 0 0 0  PHQ-9 Score 3 11 11 9 11   Difficult doing work/chores Not difficult at all Somewhat difficult Somewhat difficult Somewhat difficult Somewhat difficult      03/29/2023   11:11 AM 03/16/2023    1:12 PM 03/01/2023    1:06 PM 02/15/2023    3:06 PM  GAD 7 : Generalized Anxiety Score  Nervous, Anxious, on Edge 0 1 2 1   Control/stop worrying 0 1 2 3   Worry too much - different things 0 1 2 3   Trouble relaxing 0 1 2 1   Restless 1 3 1 3   Easily annoyed or irritable 0 2 2 1   Afraid - awful might happen 0 0 0 3  Total GAD 7 Score 1 9 11 15   Anxiety Difficulty Not difficult at all Somewhat difficult Somewhat difficult Somewhat difficult      Suicidal/Homicidal: Nowithout intent/plan  Therapist Response: Clinician utilized CBT, MI, and solution focused interventions to address pt's identified presenting problems and depressive and anxious sxs.   -Actively engaged pt in introductory check-in, assessing presenting moods and affect, prompting brief detailing of recent events, actively listening the patient reports of the events of the past 2 weeks since previous session, providing support and processing events and identification of any noted stressors.  Actively engage patient in reassessment of presenting depressive and anxious symptoms via PHQ-9 and GAD-7, noting of significant reduction in presenting symptoms across settings.  Utilized open-ended questions to further elicit recounts of events, in efforts to explore presence of identified stressors or challenges.  Utilized Socratic questioning to further support patient in critical thinking surrounding current and previously identified stressors and approaches in navigating.  Utilized CBT and psychoeducational interventions to support patient in processing effectiveness of avoidance and/or suppression of thoughts, feelings and emotions, reviewing prolonged implications of trauma on overall functioning with continued suppression of feelings.  Utilized MI techniques and supporting patient and exploring perspectives of benefits to utilizing journaling to support self in navigating thoughts, feelings, and perspectives surrounding presenting stressors.  Clinician reassessed severity of depressive and anxious sxs, and presence of any safety concerns. Therapist provided support and empathy to patient during session.  Plan: Return again in 2 week.  Diagnosis:  Encounter Diagnoses  Name Primary?   Generalized anxiety disorder Yes   Severe major depression without psychotic features (HCC)    PTSD (post-traumatic stress disorder)       Collaboration of Care: Psychiatrist AEB provider  notes available in EHR.  Patient/Guardian was advised Release of Information must be obtained prior to any record release in order to collaborate their care with an outside provider. Patient/Guardian was advised if they have not already done so to contact the registration department to sign all necessary forms in order for [place] to release information regarding their care.   Consent: Patient/Guardian gives verbal consent for treatment and assignment of benefits for services provided during this visit. Patient/Guardian expressed understanding and agreed to proceed.   Leisa Lenz, MSW, LCSW 03/29/2023,  11:52 AM

## 2023-03-30 ENCOUNTER — Encounter (HOSPITAL_COMMUNITY)
Admission: RE | Admit: 2023-03-30 | Discharge: 2023-03-30 | Disposition: A | Discharge status: Home / Self Care | Source: Ambulatory Visit | Attending: Internal Medicine | Admitting: Internal Medicine

## 2023-03-30 DIAGNOSIS — J449 Chronic obstructive pulmonary disease, unspecified: Secondary | ICD-10-CM | POA: Diagnosis not present

## 2023-03-30 NOTE — Progress Notes (Signed)
 Daily Session Note  Patient Details  Name: Jill Glenn MRN: [id number] Date of Birth: Jun 21, 1965 Referring Provider:   Doristine Devoid Pulmonary Rehab Walk Test from 10/26/2022 in [facility] for [facility], V[facility], & [facility]  Referring Provider [name]       Encounter Date: 03/30/2023  Check In:  Session Check In - 03/30/23 1023       Check-In   Supervising physician immediately available to respond to emergencies [facility] MD immediately available    Physician(s) Jari Favre, PA    Location [facility]    Staff Present Durel Salts, Zella Richer, MS, ACSM-CEP, Exercise Physiologist;Randi Dionisio Paschal, ACSM-CEP, Exercise Physiologist;Samantha Belarus, RD, Dutch Gray, RN, BSN    Virtual Visit No    Medication changes reported     No    Fall or balance concerns reported    No    Tobacco Cessation No Change    Warm-up and Cool-down Performed as group-led Writer Performed Yes    VAD Patient? No    PAD/SET Patient? No      Pain Assessment   Currently in Pain? No/denies             Capillary Blood Glucose: No results found for this or any previous visit (from the past 24 hours).    Social History   Tobacco Use  Smoking Status Former   Current packs/day: 0.00   Average packs/day: 1 pack/day for 40.6 years (40.6 ttl pk-yrs)   Types: Cigarettes   Start date: 03/05/1981   Quit date: 10/14/2021   Years since quitting: 1.4  Smokeless Tobacco Never    Goals Met:  Proper associated with RPD/PD & O2 Sat Independence with exercise equipment Exercise tolerated well No report of concerns or symptoms today Strength training completed today  Goals Unmet:  Not Applicable  Comments: Service time is from 1007 to 1134.    Dr. Mechele Collin is Medical Director for Pulmonary Rehab at [facility].

## 2023-04-02 DIAGNOSIS — R0602 Shortness of breath: Secondary | ICD-10-CM | POA: Diagnosis not present

## 2023-04-02 DIAGNOSIS — J449 Chronic obstructive pulmonary disease, unspecified: Secondary | ICD-10-CM | POA: Diagnosis not present

## 2023-04-02 DIAGNOSIS — J432 Centrilobular emphysema: Secondary | ICD-10-CM | POA: Diagnosis not present

## 2023-04-04 ENCOUNTER — Encounter (HOSPITAL_COMMUNITY)
Admission: RE | Admit: 2023-04-04 | Discharge: 2023-04-04 | Disposition: A | Discharge status: Home / Self Care | Source: Ambulatory Visit | Attending: Internal Medicine | Admitting: Internal Medicine

## 2023-04-04 ENCOUNTER — Ambulatory Visit
Admission: RE | Admit: 2023-04-04 | Discharge: 2023-04-04 | Disposition: A | Discharge status: Home / Self Care | Source: Ambulatory Visit | Attending: Internal Medicine | Admitting: Internal Medicine

## 2023-04-04 VITALS — Wt 179.7 lb

## 2023-04-04 DIAGNOSIS — R911 Solitary pulmonary nodule: Secondary | ICD-10-CM | POA: Diagnosis not present

## 2023-04-04 DIAGNOSIS — J449 Chronic obstructive pulmonary disease, unspecified: Secondary | ICD-10-CM | POA: Insufficient documentation

## 2023-04-04 DIAGNOSIS — Z87891 Personal history of nicotine dependence: Secondary | ICD-10-CM | POA: Diagnosis not present

## 2023-04-04 NOTE — Progress Notes (Signed)
 Daily Session Note  Patient Details  Name: Jill Glenn MRN: [id number] Date of Birth: 1965/10/23 Referring Provider:   Doristine Devoid Pulmonary Rehab Walk Test from 10/26/2022 in [facility], V[facility], & [facility]  Referring Provider [name]       Encounter Date: 04/04/2023  Check In:  Session Check In - 04/04/23 1027       Check-In   Supervising physician immediately available to respond to emergencies [facility] MD immediately available    Physician(s) Jari Favre, PA    [facility]    Staff Present Durel Salts, Zella Richer, MS, ACSM-CEP, Exercise Physiologist;Randi Dionisio Paschal, ACSM-CEP, Exercise Physiologist;Samantha Belarus, RD, Dutch Gray, RN, BSN    Virtual Visit No    Medication changes reported     No    Fall or balance concerns reported    No    Tobacco Cessation No Change    Warm-up and Cool-down Performed as group-led Writer Performed Yes    VAD Patient? No    PAD/SET Patient? No      Pain Assessment   Currently in Pain? No/denies             Capillary Blood Glucose: No results found for this or any previous visit (from the past 24 hours).   Exercise Prescription Changes - 04/04/23 1100       Response to Exercise   Blood Pressure (Admit) 100/62    Blood Pressure (Exercise) 110/64    Blood Pressure (Exit) 94/58    Heart Rate (Admit) 72 bpm    Heart Rate (Exercise) 94 bpm    Heart Rate (Exit) 78 bpm    Oxygen Saturation (Admit) 97 %    Oxygen Saturation (Exercise) 92 %    Oxygen Saturation (Exit) 95 %    Rating of Perceived Exertion (Exercise) 13    Perceived Dyspnea (Exercise) 3    Duration Continue with 30 min of aerobic exercise without signs/symptoms of physical distress.    Intensity THRR unchanged      Progression   Progression Continue to progress workloads to maintain intensity without signs/symptoms of physical distress.      Resistance Training    Training Prescription Yes    Weight blue bands    Reps 10-15    Time 10 Minutes      Interval Training   Interval Training Yes    Equipment Treadmill    Comments 82min@2 .50mph&3%incline METS 3.6, 50min@1 .52mph&0%incline METS 2.2      Oxygen   Oxygen Continuous    Liters 2      Recumbant Elliptical   Level 4    RPM 56    Watts 75    Minutes 15    METs 3.5      Oxygen   Maintain Oxygen Saturation 88% or higher             Social History   Tobacco Use  Smoking Status Former   Current packs/day: 0.00   Average packs/day: 1 pack/day for 40.6 years (40.6 ttl pk-yrs)   Types: Cigarettes   Start date: 03/05/1981   Quit date: 10/14/2021   Years since quitting: 1.4  Smokeless Tobacco Never    Goals Met:  Proper associated with RPD/PD & O2 Sat Independence with exercise equipment Exercise tolerated well No report of concerns or symptoms today Strength training completed today  Goals Unmet:  Not Applicable  Comments: Service time is from 1009 to  1131.    Dr. Mechele Collin is Medical Director for Pulmonary Rehab at [facility].

## 2023-04-06 ENCOUNTER — Telehealth (HOSPITAL_COMMUNITY): Payer: Self-pay

## 2023-04-06 ENCOUNTER — Encounter (HOSPITAL_COMMUNITY): Admission: RE | Admit: 2023-04-06 | Source: Ambulatory Visit

## 2023-04-06 NOTE — Telephone Encounter (Signed)
 Patient c/o due to migraine.

## 2023-04-11 ENCOUNTER — Encounter: Payer: Self-pay | Admitting: Internal Medicine

## 2023-04-11 ENCOUNTER — Encounter (HOSPITAL_COMMUNITY)
Admission: RE | Admit: 2023-04-11 | Discharge: 2023-04-11 | Disposition: A | Discharge status: Home / Self Care | Source: Ambulatory Visit | Attending: Internal Medicine | Admitting: Internal Medicine

## 2023-04-11 ENCOUNTER — Ambulatory Visit: Payer: 59 | Admitting: Internal Medicine

## 2023-04-11 VITALS — BP 126/82 | HR 59 | Ht 63.0 in | Wt 177.2 lb

## 2023-04-11 VITALS — Wt 176.8 lb

## 2023-04-11 DIAGNOSIS — J9611 Chronic respiratory failure with hypoxia: Secondary | ICD-10-CM | POA: Diagnosis not present

## 2023-04-11 DIAGNOSIS — J301 Allergic rhinitis due to pollen: Secondary | ICD-10-CM

## 2023-04-11 DIAGNOSIS — J449 Chronic obstructive pulmonary disease, unspecified: Secondary | ICD-10-CM

## 2023-04-11 DIAGNOSIS — J439 Emphysema, unspecified: Secondary | ICD-10-CM | POA: Diagnosis not present

## 2023-04-11 DIAGNOSIS — J4489 Other specified chronic obstructive pulmonary disease: Secondary | ICD-10-CM | POA: Diagnosis not present

## 2023-04-11 DIAGNOSIS — D7219 Other eosinophilia: Secondary | ICD-10-CM

## 2023-04-11 DIAGNOSIS — R911 Solitary pulmonary nodule: Secondary | ICD-10-CM

## 2023-04-11 NOTE — Progress Notes (Signed)
 TYTIANNA GREENLEY    [id number]    [birth year]  Primary Care Physician:Jones, Bernadene Bell, MD Date of Appointment: 04/11/2023 Established Patient Visit  Chief complaint:   Chief Complaint  Patient presents with   Follow-up    Sob has gotten worse and pain in the left lungs, started 2-3 weeks ago.     HPI: Jill Glenn is a 58 y.o. woman with COPD, very severe FEV1 24% of predicted. She did all 13 weeks of pulmonary rehab, finished in feb 2024. Did really well, had dramatic improvement. She is on [facility] with POC Since May 2025 Quit smoking October 2023.   Interval Updates: Here for COPD follow up.  She followed up for LVRS at [facility] and ultimately decided not to go with EBV or LVRS.  She is still doing pulmonary rehab and that's going well. She has a 6 month follow up this Thursday for 6 month follow up with lung transplant team.   Now wearing oxygen 2-3L continuously including at night.   On trelegy 1 puff once daily.   No interval exacerbations or hospitalizations for COPD.   Has occasional pain in her left posterior hemithorax. She feels she has a had time getting breath in, and feels her left lung is worse than her right   Allergies doing well on singulair and xyzal. Occasional runny nose.   Still staying busy with grandchildren, watching them afterschool.   Using albuterol a little more frequently 3-4 times/week  I have reviewed the patient's family social and past medical history and updated as appropriate.   Past Medical History:  Diagnosis Date   Allergy    seasonal   Anxiety    Asthma    COPD (chronic obstructive pulmonary disease) (HCC)    Depression    Emphysema of lung (HCC)    PTSD (post-traumatic stress disorder)     Past Surgical History:  Procedure Laterality Date   ABDOMINAL HYSTERECTOMY     BREAST EXCISIONAL BIOPSY     moles     mx 2 removed   TONSILLECTOMY      Family History  Problem Relation Age of Onset   Migraines Mother     COPD Mother    Lung cancer Mother    Bipolar disorder Mother    Melanoma Father    Colon polyps Father    Alcohol abuse Father    Melanoma Paternal Aunt    Colon cancer Neg Hx    Crohn's disease Neg Hx    Esophageal cancer Neg Hx    Rectal cancer Neg Hx    Stomach cancer Neg Hx    Ulcerative colitis Neg Hx     Social History   Occupational History   Occupation: Audiological scientist  Tobacco Use   Smoking status: Former    Current packs/day: 0.00    Average packs/day: 1 pack/day for 40.6 years (40.6 ttl pk-yrs)    Types: Cigarettes    Start date: 03/05/1981    Quit date: 10/14/2021    Years since quitting: 1.4   Smokeless tobacco: Never  Vaping Use   Vaping status: Never Used  Substance and Sexual Activity   Alcohol use: Yes    Comment: rarely - approx. 1 beer/month   Drug use: No   Sexual activity: Yes    Partners: Male     Physical Exam: Blood pressure 126/82, pulse (!) 59, height 5\' 3"  (1.6 m), weight 177 lb 3.2 oz (80.4 kg), SpO2  98%.  Gen:     On POC, able to speak in full sentences Lungs:    diminished, no wheezes or crackles CV:       diminished,RRR no peripheral edema   Data Reviewed: Imaging: I have personally reviewed the CT Chest March 2025 which shows stable LUL nodule, extensive bilateral upper lobe predominant emphysema. Formal radiology interpretation pending.   PFTs:     Latest Ref Rng & Units 10/15/2021    8:50 AM  PFT Results  FVC-Pre L 1.58   FVC-Predicted Pre % 47   FVC-Post L 1.92   FVC-Predicted Post % 58   Pre FEV1/FVC % % 40   Post FEV1/FCV % % 41   FEV1-Pre L 0.64   FEV1-Predicted Pre % 24   FEV1-Post L 0.79   DLCO uncorrected ml/min/mmHg 11.71   DLCO UNC% % 58   DLCO corrected ml/min/mmHg 11.71   DLCO COR %Predicted % 58   DLVA Predicted % 67   TLC L 6.59   TLC % Predicted % 134   RV % Predicted % 253    I have personally reviewed the patient's PFTs and very severe COPD FEv1  Labs:  Immunization status: Immunization  History  Administered Date(s) Administered   Hepb-cpg 11/16/2022   Influenza, Seasonal, Injecte, Preservative Fre 11/16/2022   Influenza,inj,Quad PF,6+ Mos 12/21/2020, 10/14/2021   PNEUMOCOCCAL CONJUGATE-20 12/21/2020   Tdap 03/22/2021   Zoster Recombinant(Shingrix) 03/22/2021, 04/12/2022   test October 2024 at [facility] Patient walked 10 laps 0 partials 329 meters/1080 feet- 66% on Room Air   External Records Personally Reviewed: pulmonary, hospital, pcp  Assessment:  Very Severe COPD FEV 1 24% - transplant candidate evaluation [facility] in process Seasonal allergic rhinitis, controlled History of tobacco use disorder - last smoked October 2023 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   Stay active as you are doing.   I am ordering an overnight oximetry test on [facility] to see if you qualify for BIPAP for COPD.   Continue follow up with [facility] for lung transplantation evaluation. I suspect she is more dyspneic and has had progression of clinical symptoms since last visit. I suspect her BODE score will have worsened but will await results which I assume will be performed at Transplant follow up.   Continue pulmonary rehab.   Your CT Scan on my interpretation shows a stable LUL nodule. I suspect we will follow this again in a few months. I will await the final radiology interpretation.   You had the PCV 20 vaccine in 2022 so I think you are up to date on the pneumonia vaccine for now.   I do recommend shingles vaccine as well.    Return to Care: Return in about 3 months (around 07/11/2023).   Durel Salts, MD Pulmonary and Critical Care Medicine [facility] [phone]

## 2023-04-11 NOTE — Patient Instructions (Signed)
 It was a pleasure to see you today!  Please schedule follow up scheduled with myself in 3 months.  If my schedule is not open yet, we will contact you with a reminder closer to that time. Please call [phone] if you haven't heard from [place] a month before, and always call us sooner if issues or concerns arise. You can also send [place] a message through MyChart, but but aware that this is not to be used for urgent issues and it may take up to 5-7 days to receive a reply. Please be aware that you will likely be able to view your results before I have a chance to respond to them. Please give [place] 5 business days to respond to any non-urgent results.    Continue Trelegy  Continue xyzal and singulair for allergies.   Stay active as you are doing.   I am ordering an overnight oximetry test on [facility] to see if you qualify for BIPAP for COPD.   Continue follow up with [facility] for lung transplantation evaluation.   Continue pulmonary rehab.   Your CT Scan on my interpretation shows a stable LUL nodule. I suspect we will follow this again in a few months. I will await the final radiology interpretation.   You had the PCV 20 vaccine in 2022 so I think you are up to date on the pneumonia vaccine for now.   I do recommend shingles vaccine as well.

## 2023-04-11 NOTE — Progress Notes (Signed)
 Daily Session Note  Patient Details  Name: Jill Glenn MRN: [id number] Date of Birth: 18-Sep-1965 Referring Provider:   Doristine Devoid Pulmonary Rehab Walk Test from 10/26/2022 in [facility] for Heart, Vascular, & Lung Health  Referring Provider [name]       Encounter Date: 04/11/2023  Check In:  Session Check In - 04/11/23 1019       Check-In   Supervising physician immediately available to respond to emergencies [facility] MD immediately available    Physician(s) Rise Paganini, NP    Location [facility]    Staff Present Durel Salts, Zella Richer, MS, ACSM-CEP, Exercise Physiologist;Randi Idelle Crouch BS, ACSM-CEP, Exercise Physiologist;Samantha Belarus, RD, Dutch Gray, RN, BSN    Virtual Visit No    Medication changes reported     No    Fall or balance concerns reported    No    Tobacco Cessation No Change    Warm-up and Cool-down Performed as group-led Writer Performed Yes    VAD Patient? No    PAD/SET Patient? No      Pain Assessment   Currently in Pain? No/denies    Multiple Pain Sites No             Capillary Blood Glucose: No results found for this or any previous visit (from the past 24 hours).    Social History   Tobacco Use  Smoking Status Former   Current packs/day: 0.00   Average packs/day: 1 pack/day for 40.6 years (40.6 ttl pk-yrs)   Types: Cigarettes   Start date: 03/05/1981   Quit date: 10/14/2021   Years since quitting: 1.4  Smokeless Tobacco Never    Goals Met:  Proper associated with RPD/PD & O2 Sat Independence with exercise equipment Exercise tolerated well No report of concerns or symptoms today Strength training completed today  Goals Unmet:  Not Applicable  Comments: Service time is from 1005 to 1127.    Dr. Mechele Collin is Medical Director for Pulmonary Rehab at [facility].

## 2023-04-12 ENCOUNTER — Ambulatory Visit (HOSPITAL_COMMUNITY): Admitting: Licensed Clinical Social Worker

## 2023-04-13 ENCOUNTER — Encounter (HOSPITAL_COMMUNITY): Admission: RE | Admit: 2023-04-13 | Source: Ambulatory Visit

## 2023-04-13 DIAGNOSIS — Z7682 Awaiting organ transplant status: Secondary | ICD-10-CM | POA: Diagnosis not present

## 2023-04-13 DIAGNOSIS — R739 Hyperglycemia, unspecified: Secondary | ICD-10-CM | POA: Diagnosis not present

## 2023-04-13 DIAGNOSIS — Z01818 Encounter for other preprocedural examination: Secondary | ICD-10-CM | POA: Diagnosis not present

## 2023-04-13 DIAGNOSIS — R918 Other nonspecific abnormal finding of lung field: Secondary | ICD-10-CM | POA: Diagnosis not present

## 2023-04-13 DIAGNOSIS — Z79899 Other long term (current) drug therapy: Secondary | ICD-10-CM | POA: Diagnosis not present

## 2023-04-13 DIAGNOSIS — F419 Anxiety disorder, unspecified: Secondary | ICD-10-CM | POA: Diagnosis not present

## 2023-04-13 DIAGNOSIS — J439 Emphysema, unspecified: Secondary | ICD-10-CM | POA: Diagnosis not present

## 2023-04-14 DIAGNOSIS — F330 Major depressive disorder, recurrent, mild: Secondary | ICD-10-CM | POA: Diagnosis not present

## 2023-04-14 DIAGNOSIS — F064 Anxiety disorder due to known physiological condition: Secondary | ICD-10-CM | POA: Diagnosis not present

## 2023-04-18 ENCOUNTER — Encounter (HOSPITAL_COMMUNITY)

## 2023-04-19 NOTE — Progress Notes (Addendum)
 Pulmonary Individual Treatment Plan  Patient Details  Name: Jill Glenn MRN: [id number] Date of Birth: [birth year] Referring Provider:   Gattis Kass Pulmonary Rehab Walk Test from 10/26/2022 in [facility]  Referring Provider Dione Franks       Initial Encounter Date:  Flowsheet Row Pulmonary Rehab Walk Test from 10/26/2022 in [facility]  Date 10/26/22       Visit Diagnosis: Stage 3 severe COPD by GOLD classification (HCC)  Patient's Home Medications on Admission:   Current Outpatient Medications:    acetaminophen (TYLENOL) 325 MG tablet, Take 2 tablets (650 mg total) by mouth every 6 (six) hours as needed for moderate pain, mild pain or headache., Disp: , Rfl:    albuterol (PROVENTIL) (2.5 MG/3ML) 0.083% nebulizer solution, Take 3 mLs (2.5 mg total) by nebulization every 6 (six) hours as needed for wheezing or shortness of breath., Disp: 75 mL, Rfl: 12   albuterol (VENTOLIN HFA) 108 (90 Base) MCG/ACT inhaler, Inhale 2 puffs into the lungs every 6 (six) hours as needed for wheezing or shortness of breath., Disp: 18 g, Rfl: 3   Ascorbic Acid (VITAMIN C GUMMIES PO), Take 500 mg by mouth daily., Disp: , Rfl:    busPIRone (BUSPAR) 15 MG tablet, Take 1 tablet (15 mg total) by mouth 2 (two) times daily., Disp: 180 tablet, Rfl: 0   citalopram (CELEXA) 10 MG tablet, Take 1 tablet (10 mg total) by mouth daily., Disp: 90 tablet, Rfl: 0   hydrOXYzine (ATARAX) 25 MG tablet, Take 1 tablet (25 mg total) by mouth at bedtime as needed (sleep)., Disp: 90 tablet, Rfl: 0   levocetirizine (XYZAL) 5 MG tablet, Take 1 tablet (5 mg total) by mouth every evening., Disp: 30 tablet, Rfl: 11   montelukast (SINGULAIR) 10 MG tablet, Take 1 tablet (10 mg total) by mouth at bedtime., Disp: 30 tablet, Rfl: 11   Multiple Vitamin (MULTIVITAMIN ADULT) TABS, Take 1 tablet by mouth daily with  breakfast., Disp: , Rfl:    rizatriptan (MAXALT) 10 MG tablet, Take 1 tablet (10 mg total) by mouth once as needed for migraine. May repeat in 2 hours if needed, Disp: 30 tablet, Rfl: 2   TRELEGY ELLIPTA 200-62.5-25 MCG/ACT AEPB, Inhale 1 Inhalation into the lungs daily., Disp: 1 each, Rfl: 5   Ubrogepant (UBRELVY) 100 MG TABS, Take one tablet onset of Migraine May take another tablet 2 hours later, Don't exceed two tablets daily, Disp: 16 tablet, Rfl: 11   varenicline (CHANTIX) 1 MG tablet, Take 1 mg by mouth daily., Disp: , Rfl:   Past Medical History: Past Medical History:  Diagnosis Date   Allergy    seasonal   Anxiety    Asthma    COPD (chronic obstructive pulmonary disease) (HCC)    Depression    Emphysema of lung (HCC)    PTSD (post-traumatic stress disorder)     Tobacco Use: Social History   Tobacco Use  Smoking Status Former   Current packs/day: 0.00   Average packs/day: 1 pack/day for 40.6 years (40.6 ttl pk-yrs)   Types: Cigarettes   Start date: 03/05/1981   Quit date: 10/14/2021   Years since quitting: 1.5  Smokeless Tobacco Never    Labs: Review Flowsheet  More data may exist      Latest Ref Rng & Units 11/02/2020 12/21/2020 03/24/2022 04/12/2022 11/16/2022  Labs for ITP Cardiac and Pulmonary Rehab  Cholestrol  0 - 200 mg/dL - 086  - 578  -  LDL (calc) 0 - 99 mg/dL - 91  - 469  -  HDL-C >62.95 mg/dL - 28.41  - 32.44  -  Trlycerides 0.0 - 149.0 mg/dL - 01.0  - 272.5  -  Hemoglobin A1c 4.6 - 6.5 % - - 6.0  - 5.9   Bicarbonate 20.0 - 28.0 mmol/L 22.2  - - - -  TCO2 22 - 32 mmol/L 23  - - - -  Acid-base deficit 0.0 - 2.0 mmol/L 3.0  - - - -  O2 Saturation % 90.0  - - - -    Capillary Blood Glucose: Lab Results  Component Value Date   GLUCAP 73 03/26/2022   GLUCAP 148 (H) 03/25/2022   GLUCAP 166 (H) 03/25/2022   GLUCAP 88 03/25/2022   GLUCAP 84 03/25/2022     Pulmonary Assessment Scores:  Pulmonary Assessment Scores     Row Name 10/26/22 1135          ADL UCSD   ADL Phase Entry     SOB Score total 54       CAT Score   CAT Score 16       mMRC Score   mMRC Score 4             UCSD: Self-administered rating of dyspnea associated with activities of daily living (ADLs) 6-point scale (0 = "not at all" to 5 = "maximal or unable to do because of breathlessness")  Scoring Scores range from 0 to 120.  Minimally important difference is 5 units  CAT: CAT can identify the health impairment of COPD patients and is better correlated with disease progression.  CAT has a scoring range of zero to 40. The CAT score is classified into four groups of low (less than 10), medium (10 - 20), high (21-30) and very high (31-40) based on the impact level of disease on health status. A CAT score over 10 suggests significant symptoms.  A worsening CAT score could be explained by an exacerbation, poor medication adherence, poor inhaler technique, or progression of COPD or comorbid conditions.  CAT MCID is 2 points  mMRC: mMRC (Modified Medical Research Council) Dyspnea Scale is used to assess the degree of baseline functional disability in patients of respiratory disease due to dyspnea. No minimal important difference is established. A decrease in score of 1 point or greater is considered a positive change.   Pulmonary Function Assessment:  Pulmonary Function Assessment - 10/26/22 1109       Breath   Bilateral Breath Sounds Clear;Decreased    Shortness of Breath Limiting activity;Panic with Shortness of Breath   only panics if she doesn't have her inhaler with her            Exercise Target Goals: Exercise Program Goal: Individual exercise prescription set using results from initial 6 min walk test and THRR while considering  patient's activity barriers and safety.   Exercise Prescription Goal: Initial exercise prescription builds to 30-45 minutes a day of aerobic activity, 2-3 days per week.  Home exercise guidelines will be given to patient  during program as part of exercise prescription that the participant will acknowledge.  Activity Barriers & Risk Stratification:  Activity Barriers & Cardiac Risk Stratification - 10/26/22 1107       Activity Barriers & Cardiac Risk Stratification   Activity Barriers Deconditioning;Muscular Weakness;Shortness of Breath;Arthritis;Back Problems    Cardiac Risk Stratification Moderate  6 Minute Walk:  6 Minute Walk     Row Name 10/26/22 1201         6 Minute Walk   Phase Initial     Distance 1125 feet     Walk Time 6 minutes     # of Rest Breaks 1  2:12-3:00     MPH 2.13     METS 2.98     RPE 14.5     Perceived Dyspnea  3     VO2 Peak 10.43     Symptoms No     Resting HR 74 bpm     Resting BP 106/70     Resting Oxygen Saturation  95 %     Exercise Oxygen Saturation  during 6 min walk 84 %     Max Ex. HR 107 bpm     Max Ex. BP 110/70     2 Minute Post BP 106/60       Interval HR   1 Minute HR 83     2 Minute HR 85     3 Minute HR 90     4 Minute HR 89     5 Minute HR 107     6 Minute HR 98     2 Minute Post HR 76     Interval Heart Rate? Yes       Interval Oxygen   Interval Oxygen? Yes     Baseline Oxygen Saturation % 95 %     1 Minute Oxygen Saturation % 97 %     1 Minute Liters of Oxygen 0 L     2 Minute Oxygen Saturation % 89 %     2 Minute Liters of Oxygen 0 L     3 Minute Oxygen Saturation % 94 %  2:12 84%     3 Minute Liters of Oxygen 0 L  increased to 2L     4 Minute Oxygen Saturation % 92 %     4 Minute Liters of Oxygen 2 L     5 Minute Oxygen Saturation % 92 %     5 Minute Liters of Oxygen 2 L     6 Minute Oxygen Saturation % 89 %     6 Minute Liters of Oxygen 2 L     2 Minute Post Oxygen Saturation % 97 %     2 Minute Post Liters of Oxygen 2 L              Oxygen Initial Assessment:  Oxygen Initial Assessment - 10/26/22 1108       Home Oxygen   Home Oxygen Device Home Concentrator;Portable Concentrator;E-Tanks    Sleep  Oxygen Prescription Continuous    Liters per minute 2    Home Exercise Oxygen Prescription Pulsed    Liters per minute 2    Home Resting Oxygen Prescription None    Compliance with Home Oxygen Use Yes      Initial 6 min Walk   Oxygen Used Continuous    Liters per minute 2      Program Oxygen Prescription   Program Oxygen Prescription Continuous    Liters per minute 2      Intervention   Short Term Goals To learn and exhibit compliance with exercise, home and travel O2 prescription;To learn and understand importance of monitoring SPO2 with pulse oximeter and demonstrate accurate use of the pulse oximeter.;To learn and understand importance of maintaining oxygen saturations>88%;To learn and demonstrate proper pursed lip  breathing techniques or other breathing techniques. ;To learn and demonstrate proper use of respiratory medications    Long  Term Goals Exhibits compliance with exercise, home  and travel O2 prescription;Maintenance of O2 saturations>88%;Compliance with respiratory medication;Verbalizes importance of monitoring SPO2 with pulse oximeter and return demonstration;Exhibits proper breathing techniques, such as pursed lip breathing or other method taught during program session;Demonstrates proper use of MDI's             Oxygen Re-Evaluation:  Oxygen Re-Evaluation     Row Name 10/31/22 0848 11/23/22 1202 12/19/22 0909 01/16/23 0934 02/13/23 1206     Program Oxygen Prescription   Program Oxygen Prescription Continuous Continuous Continuous Continuous Continuous   Liters per minute 2 2 2 2 2      Home Oxygen   Home Oxygen Device Home Concentrator;Portable Concentrator;E-Tanks Home Concentrator;Portable Concentrator;E-Tanks Home Concentrator;Portable Concentrator;E-Tanks Home Concentrator;Portable Concentrator;E-Tanks Home Concentrator;Portable Concentrator;E-Tanks   Sleep Oxygen Prescription Continuous Continuous Continuous Continuous Continuous   Liters per minute 2 2 2 2 2     Home Exercise Oxygen Prescription Pulsed Pulsed Pulsed Pulsed Pulsed   Liters per minute 2 2 2 2 2    Home Resting Oxygen Prescription None None None None None   Compliance with Home Oxygen Use Yes Yes Yes Yes Yes     Goals/Expected Outcomes   Short Term Goals To learn and exhibit compliance with exercise, home and travel O2 prescription;To learn and understand importance of monitoring SPO2 with pulse oximeter and demonstrate accurate use of the pulse oximeter.;To learn and understand importance of maintaining oxygen saturations>88%;To learn and demonstrate proper pursed lip breathing techniques or other breathing techniques. ;To learn and demonstrate proper use of respiratory medications To learn and exhibit compliance with exercise, home and travel O2 prescription;To learn and understand importance of monitoring SPO2 with pulse oximeter and demonstrate accurate use of the pulse oximeter.;To learn and understand importance of maintaining oxygen saturations>88%;To learn and demonstrate proper pursed lip breathing techniques or other breathing techniques. ;To learn and demonstrate proper use of respiratory medications To learn and exhibit compliance with exercise, home and travel O2 prescription;To learn and understand importance of monitoring SPO2 with pulse oximeter and demonstrate accurate use of the pulse oximeter.;To learn and understand importance of maintaining oxygen saturations>88%;To learn and demonstrate proper pursed lip breathing techniques or other breathing techniques. ;To learn and demonstrate proper use of respiratory medications To learn and exhibit compliance with exercise, home and travel O2 prescription;To learn and understand importance of monitoring SPO2 with pulse oximeter and demonstrate accurate use of the pulse oximeter.;To learn and understand importance of maintaining oxygen saturations>88%;To learn and demonstrate proper pursed lip breathing techniques or other breathing  techniques. ;To learn and demonstrate proper use of respiratory medications To learn and exhibit compliance with exercise, home and travel O2 prescription;To learn and understand importance of monitoring SPO2 with pulse oximeter and demonstrate accurate use of the pulse oximeter.;To learn and understand importance of maintaining oxygen saturations>88%;To learn and demonstrate proper pursed lip breathing techniques or other breathing techniques. ;To learn and demonstrate proper use of respiratory medications   Long  Term Goals Exhibits compliance with exercise, home  and travel O2 prescription;Maintenance of O2 saturations>88%;Compliance with respiratory medication;Verbalizes importance of monitoring SPO2 with pulse oximeter and return demonstration;Exhibits proper breathing techniques, such as pursed lip breathing or other method taught during program session;Demonstrates proper use of MDI's Exhibits compliance with exercise, home  and travel O2 prescription;Maintenance of O2 saturations>88%;Compliance with respiratory medication;Verbalizes importance of monitoring SPO2 with pulse oximeter and  return demonstration;Exhibits proper breathing techniques, such as pursed lip breathing or other method taught during program session;Demonstrates proper use of MDI's Exhibits compliance with exercise, home  and travel O2 prescription;Maintenance of O2 saturations>88%;Compliance with respiratory medication;Verbalizes importance of monitoring SPO2 with pulse oximeter and return demonstration;Exhibits proper breathing techniques, such as pursed lip breathing or other method taught during program session;Demonstrates proper use of MDI's Exhibits compliance with exercise, home  and travel O2 prescription;Maintenance of O2 saturations>88%;Compliance with respiratory medication;Verbalizes importance of monitoring SPO2 with pulse oximeter and return demonstration;Exhibits proper breathing techniques, such as pursed lip breathing or  other method taught during program session;Demonstrates proper use of MDI's Exhibits compliance with exercise, home  and travel O2 prescription;Maintenance of O2 saturations>88%;Compliance with respiratory medication;Verbalizes importance of monitoring SPO2 with pulse oximeter and return demonstration;Exhibits proper breathing techniques, such as pursed lip breathing or other method taught during program session;Demonstrates proper use of MDI's   Goals/Expected Outcomes Compliance and understanding of oxygen saturations monitoring and breathing techniques to decrease shortness of breath. Compliance and understanding of oxygen saturations monitoring and breathing techniques to decrease shortness of breath. Compliance and understanding of oxygen saturations monitoring and breathing techniques to decrease shortness of breath. Compliance and understanding of oxygen saturations monitoring and breathing techniques to decrease shortness of breath. Compliance and understanding of oxygen saturations monitoring and breathing techniques to decrease shortness of breath.    Row Name 03/20/23 0906 04/12/23 1049           Program Oxygen Prescription   Program Oxygen Prescription Continuous Continuous      Liters per minute 2 2        Home Oxygen   Home Oxygen Device Home Concentrator;Portable Concentrator;E-Tanks Home Concentrator;Portable Concentrator;E-Tanks      Sleep Oxygen Prescription Continuous Continuous      Liters per minute 2 2      Home Exercise Oxygen Prescription Pulsed Pulsed      Liters per minute 2 2      Home Resting Oxygen Prescription None None      Compliance with Home Oxygen Use Yes Yes        Goals/Expected Outcomes   Short Term Goals To learn and exhibit compliance with exercise, home and travel O2 prescription;To learn and understand importance of monitoring SPO2 with pulse oximeter and demonstrate accurate use of the pulse oximeter.;To learn and understand importance of maintaining  oxygen saturations>88%;To learn and demonstrate proper pursed lip breathing techniques or other breathing techniques. ;To learn and demonstrate proper use of respiratory medications To learn and exhibit compliance with exercise, home and travel O2 prescription;To learn and understand importance of monitoring SPO2 with pulse oximeter and demonstrate accurate use of the pulse oximeter.;To learn and understand importance of maintaining oxygen saturations>88%;To learn and demonstrate proper pursed lip breathing techniques or other breathing techniques. ;To learn and demonstrate proper use of respiratory medications      Long  Term Goals Exhibits compliance with exercise, home  and travel O2 prescription;Maintenance of O2 saturations>88%;Compliance with respiratory medication;Verbalizes importance of monitoring SPO2 with pulse oximeter and return demonstration;Exhibits proper breathing techniques, such as pursed lip breathing or other method taught during program session;Demonstrates proper use of MDI's Exhibits compliance with exercise, home  and travel O2 prescription;Maintenance of O2 saturations>88%;Compliance with respiratory medication;Verbalizes importance of monitoring SPO2 with pulse oximeter and return demonstration;Exhibits proper breathing techniques, such as pursed lip breathing or other method taught during program session;Demonstrates proper use of MDI's      Goals/Expected Outcomes Compliance and  understanding of oxygen saturations monitoring and breathing techniques to decrease shortness of breath. Compliance and understanding of oxygen saturations monitoring and breathing techniques to decrease shortness of breath.               Oxygen Discharge (Final Oxygen Re-Evaluation):  Oxygen Re-Evaluation - 04/12/23 1049       Program Oxygen Prescription   Program Oxygen Prescription Continuous    Liters per minute 2      Home Oxygen   Home Oxygen Device Home Concentrator;Portable  Concentrator;E-Tanks    Sleep Oxygen Prescription Continuous    Liters per minute 2    Home Exercise Oxygen Prescription Pulsed    Liters per minute 2    Home Resting Oxygen Prescription None    Compliance with Home Oxygen Use Yes      Goals/Expected Outcomes   Short Term Goals To learn and exhibit compliance with exercise, home and travel O2 prescription;To learn and understand importance of monitoring SPO2 with pulse oximeter and demonstrate accurate use of the pulse oximeter.;To learn and understand importance of maintaining oxygen saturations>88%;To learn and demonstrate proper pursed lip breathing techniques or other breathing techniques. ;To learn and demonstrate proper use of respiratory medications    Long  Term Goals Exhibits compliance with exercise, home  and travel O2 prescription;Maintenance of O2 saturations>88%;Compliance with respiratory medication;Verbalizes importance of monitoring SPO2 with pulse oximeter and return demonstration;Exhibits proper breathing techniques, such as pursed lip breathing or other method taught during program session;Demonstrates proper use of MDI's    Goals/Expected Outcomes Compliance and understanding of oxygen saturations monitoring and breathing techniques to decrease shortness of breath.             Initial Exercise Prescription:  Initial Exercise Prescription - 10/26/22 1200       Date of Initial Exercise RX and Referring Provider   Date 10/26/22    Referring Provider [name]    Expected Discharge Date 01/19/23      Oxygen   Oxygen Continuous    Liters 2    Maintain Oxygen Saturation 88% or higher      Treadmill   MPH 1.7    Grade 0    Minutes 15      Recumbant Elliptical   Level 1    Minutes 15    METs 1.5      Prescription Details   Frequency (times per week) 2    Duration Progress to 30 minutes of continuous aerobic without signs/symptoms of physical distress      Intensity   THRR 40-80% of Max Heartrate 66-131     Ratings of Perceived Exertion 11-13    Perceived Dyspnea 0-4      Progression   Progression Continue progressive overload as per policy without signs/symptoms or physical distress.      Resistance Training   Training Prescription Yes    Weight blue bands    Reps 10-15             Perform Capillary Blood Glucose checks as needed.  Exercise Prescription Changes:   Exercise Prescription Changes     Row Name 11/01/22 1200 11/15/22 1100 11/24/22 0941 12/08/22 1152 12/22/22 1158     Response to Exercise   Blood Pressure (Admit) 110/77 104/70 117/75 104/60 128/96   Blood Pressure (Exercise) 109/83 112/70 -- -- --   Blood Pressure (Exit) 97/67 104/68 96/58 100/64 108/62   Heart Rate (Admit) 77 bpm 80 bpm 81 bpm 77 bpm 72 bpm   Heart Rate (  Exercise) 104 bpm 98 bpm 97 bpm 92 bpm 91 bpm   Heart Rate (Exit) 83 bpm 91 bpm 91 bpm 81 bpm 79 bpm   Oxygen Saturation (Admit) 99 % 97 % 95 % 98 % 97 %   Oxygen Saturation (Exercise) 96 % 93 % 91 % 95 % 98 %   Oxygen Saturation (Exit) 97 % 97 % 97 % 96 % 97 %   Rating of Perceived Exertion (Exercise) 13 13 12.5 12 13    Perceived Dyspnea (Exercise) 2 2 3 2 2    Duration Continue with 30 min of aerobic exercise without signs/symptoms of physical distress. Continue with 30 min of aerobic exercise without signs/symptoms of physical distress. Continue with 30 min of aerobic exercise without signs/symptoms of physical distress. Continue with 30 min of aerobic exercise without signs/symptoms of physical distress. Continue with 30 min of aerobic exercise without signs/symptoms of physical distress.   Intensity THRR unchanged THRR unchanged THRR unchanged THRR unchanged THRR unchanged     Progression   Progression Continue to progress workloads to maintain intensity without signs/symptoms of physical distress. Continue to progress workloads to maintain intensity without signs/symptoms of physical distress. Continue to progress workloads to maintain  intensity without signs/symptoms of physical distress. Continue to progress workloads to maintain intensity without signs/symptoms of physical distress. Continue to progress workloads to maintain intensity without signs/symptoms of physical distress.     Resistance Training   Training Prescription Yes Yes Yes Yes Yes   Weight blue bands blue bands blue bands blue bands blue bands   Reps 10-15 10-15 10-15 10-15 10-15   Time 10 Minutes 10 Minutes 10 Minutes 10 Minutes 10 Minutes     Interval Training   Interval Training -- -- -- Yes Yes   Equipment -- -- -- Treadmill Treadmill   Comments -- -- -- 4min@3mph  and 2% incline, @2mph  and 0% incline 73min@2mph &0%incline, 56min@mph &2%incline     Oxygen   Oxygen Continuous Continuous Continuous Continuous Continuous   Liters 2 2 2 2 2      Treadmill   MPH 1 1.1 2.1 3 --   Grade 0 0 1 2 --   Minutes 15 15 15 15  --   METs 1.6 2.1 2.2 4.1 --     Recumbant Elliptical   Level 1 1 2 2 3    Minutes 15 15 15 15 15    METs 2.1 2.6 2.6 3 2.4     Oxygen   Maintain Oxygen Saturation 88% or higher 88% or higher 88% or higher 88% or higher 88% or higher    Row Name 01/10/23 0900 01/24/23 1100 02/02/23 1158 02/21/23 1200 03/07/23 1100     Response to Exercise   Blood Pressure (Admit) 96/66 100/60 104/60 100/60 98/60   Blood Pressure (Exercise) -- 118/64 -- 142/82 134/78   Blood Pressure (Exit) 94/68 96/58 94/68  92/62 90/64   Heart Rate (Admit) 73 bpm 65 bpm 75 bpm 76 bpm 67 bpm   Heart Rate (Exercise) 106 bpm 86 bpm 97 bpm 110 bpm 95 bpm   Heart Rate (Exit) 84 bpm 72 bpm 77 bpm 90 bpm 97 bpm   Oxygen Saturation (Admit) 100 % 98 % 97 % 97 % 98 %   Oxygen Saturation (Exercise) 92 % 95 % 95 % 92 % 93 %   Oxygen Saturation (Exit) 98 % 98 % 98 % 97 % 97 %   Rating of Perceived Exertion (Exercise) 12.5 13 13  12.5 13   Perceived Dyspnea (Exercise) 2  1 2 2 2    Duration Continue with 30 min of aerobic exercise without signs/symptoms of physical distress.  Continue with 30 min of aerobic exercise without signs/symptoms of physical distress. Continue with 30 min of aerobic exercise without signs/symptoms of physical distress. Continue with 30 min of aerobic exercise without signs/symptoms of physical distress. Continue with 30 min of aerobic exercise without signs/symptoms of physical distress.   Intensity THRR unchanged THRR unchanged THRR unchanged THRR unchanged THRR unchanged     Progression   Progression Continue to progress workloads to maintain intensity without signs/symptoms of physical distress. Continue to progress workloads to maintain intensity without signs/symptoms of physical distress. Continue to progress workloads to maintain intensity without signs/symptoms of physical distress. Continue to progress workloads to maintain intensity without signs/symptoms of physical distress. Continue to progress workloads to maintain intensity without signs/symptoms of physical distress.     Resistance Training   Training Prescription Yes Yes Yes Yes Yes   Weight blue bands blue bands blue bands blue bands blue bands   Reps 10-15 10-15 10-15 10-15 10-15   Time 10 Minutes 10 Minutes 10 Minutes 10 Minutes 10 Minutes     Interval Training   Interval Training Yes -- Yes Yes Yes   Equipment Treadmill -- Treadmill Treadmill Treadmill   Comments 56min@1 .86mph&0%incline, 36min@3mph &2%incline -- 58min@1 .40moh&0%incline, 36min1.8mph&3%incline -- 50min@2mph &3%incline METS3.2, 15min1.8mph&0%incline, METS2.2     Oxygen   Oxygen Continuous Continuous Continuous Continuous Continuous   Liters 2 2 2 2 2      Treadmill   MPH 2 1.8 -- 2 2   Grade 3 0 -- 3 3   Minutes 15 15 -- 15 15   METs 4.12 2.2 -- 3.36 3.36     Recumbant Elliptical   Level 3 4 4 3 4    RPM -- 50 -- 58 --   Watts -- -- -- 78 --   Minutes 15 15 15 15 15    METs 2.7 2.9 3.5 3.6 3.8     Oxygen   Maintain Oxygen Saturation 88% or higher 88% or higher 88% or higher 88% or higher 88% or higher     Row Name 03/21/23 1100 04/04/23 1100 04/11/23 1451         Response to Exercise   Blood Pressure (Admit) 96/56 100/62 96/58     Blood Pressure (Exercise) 118/54 110/64 --     Blood Pressure (Exit) 90/56 94/58 92/60      Heart Rate (Admit) 67 bpm 72 bpm 69 bpm     Heart Rate (Exercise) 99 bpm 94 bpm 126 bpm     Heart Rate (Exit) 82 bpm 78 bpm 96 bpm     Oxygen Saturation (Admit) 96 % 97 % 99 %     Oxygen Saturation (Exercise) 93 % 92 % 90 %     Oxygen Saturation (Exit) 96 % 95 % 97 %     Rating of Perceived Exertion (Exercise) 13 13 13      Perceived Dyspnea (Exercise) 2 3 3      Duration Continue with 30 min of aerobic exercise without signs/symptoms of physical distress. Continue with 30 min of aerobic exercise without signs/symptoms of physical distress. Continue with 30 min of aerobic exercise without signs/symptoms of physical distress.     Intensity THRR unchanged THRR unchanged THRR unchanged       Progression   Progression Continue to progress workloads to maintain intensity without signs/symptoms of physical distress. Continue to progress workloads to maintain intensity without signs/symptoms of physical distress.  Continue to progress workloads to maintain intensity without signs/symptoms of physical distress.       Resistance Training   Training Prescription Yes Yes Yes     Weight blue bands blue bands blue bands     Reps 10-15 10-15 10-15     Time 10 Minutes 10 Minutes 10 Minutes       Interval Training   Interval Training Yes Yes Yes     Equipment Treadmill Treadmill Treadmill     Comments 1min@2 .80mph&3%incline METS 3.6, 1min@1 .43mph&0%incline METS 2.2 32min@2 .59mph&3%incline METS 3.6, 12min@1 .33mph&0%incline METS 2.2 22min@2 .74mph&3%incline METS 3.6, 87min@1 .74mph&0%incline METS 2.2       Oxygen   Oxygen Continuous Continuous Continuous     Liters 2 2 2        Recumbant Elliptical   Level 4 4 5      RPM -- 56 56     Watts -- 75 75     Minutes 15 15 15      METs 4 3.5 3.3        Oxygen   Maintain Oxygen Saturation 88% or higher 88% or higher 88% or higher              Exercise Comments:   Exercise Comments     Row Name 11/01/22 1208           Exercise Comments Pt completed 1st day of exercise. She exercised for 15 min on the treadmill and recumbent elliptical. She averaged 1.6 METs at 1 mph and 0 incline on the treadmill and 2.1 METs at level 1 on the recumbent elliptical. Kianah performed the warmup and cooldown standing without limitations. Trixy did not tolerate the treadmill well and will be transfered to the track. Pt understands METs from previous participating.                Exercise Goals and Review:   Exercise Goals     Row Name 10/26/22 1107             Exercise Goals   Increase Physical Activity Yes       Intervention Provide advice, education, support and counseling about physical activity/exercise needs.;Develop an individualized exercise prescription for aerobic and resistive training based on initial evaluation findings, risk stratification, comorbidities and participant's personal goals.       Expected Outcomes Short Term: Attend rehab on a regular basis to increase amount of physical activity.;Long Term: Add in home exercise to make exercise part of routine and to increase amount of physical activity.;Long Term: Exercising regularly at least 3-5 days a week.       Increase Strength and Stamina Yes       Intervention Provide advice, education, support and counseling about physical activity/exercise needs.;Develop an individualized exercise prescription for aerobic and resistive training based on initial evaluation findings, risk stratification, comorbidities and participant's personal goals.       Expected Outcomes Short Term: Increase workloads from initial exercise prescription for resistance, speed, and METs.;Short Term: Perform resistance training exercises routinely during rehab and add in resistance training at home;Long  Term: Improve cardiorespiratory fitness, muscular endurance and strength as measured by increased METs and functional capacity ( )       Able to understand and use rate of perceived exertion (RPE) scale Yes       Intervention Provide education and explanation on how to use RPE scale       Expected Outcomes Short Term: Able to use RPE daily in rehab to express subjective intensity level;Long Term:  Able to use RPE to guide intensity level when exercising independently       Able to understand and use Dyspnea scale Yes       Intervention Provide education and explanation on how to use Dyspnea scale       Expected Outcomes Short Term: Able to use Dyspnea scale daily in rehab to express subjective sense of shortness of breath during exertion;Long Term: Able to use Dyspnea scale to guide intensity level when exercising independently       Knowledge and understanding of Target Heart Rate Range (THRR) Yes       Intervention Provide education and explanation of THRR including how the numbers were predicted and where they are located for reference       Expected Outcomes Short Term: Able to state/look up THRR;Long Term: Able to use THRR to govern intensity when exercising independently;Short Term: Able to use daily as guideline for intensity in rehab       Understanding of Exercise Prescription Yes       Intervention Provide education, explanation, and written materials on patient's individual exercise prescription       Expected Outcomes Short Term: Able to explain program exercise prescription;Long Term: Able to explain home exercise prescription to exercise independently                Exercise Goals Re-Evaluation :  Exercise Goals Re-Evaluation     Row Name 10/31/22 0844 11/23/22 1159 12/19/22 0906 01/16/23 0922 02/13/23 1202     Exercise Goal Re-Evaluation   Exercise Goals Review Increase Physical Activity;Able to understand and use Dyspnea scale;Understanding of Exercise  Prescription;Increase Strength and Stamina;Knowledge and understanding of Target Heart Rate Range (THRR);Able to understand and use rate of perceived exertion (RPE) scale Increase Physical Activity;Able to understand and use Dyspnea scale;Understanding of Exercise Prescription;Increase Strength and Stamina;Knowledge and understanding of Target Heart Rate Range (THRR);Able to understand and use rate of perceived exertion (RPE) scale Increase Physical Activity;Able to understand and use Dyspnea scale;Understanding of Exercise Prescription;Increase Strength and Stamina;Knowledge and understanding of Target Heart Rate Range (THRR);Able to understand and use rate of perceived exertion (RPE) scale Increase Physical Activity;Able to understand and use Dyspnea scale;Understanding of Exercise Prescription;Increase Strength and Stamina;Knowledge and understanding of Target Heart Rate Range (THRR);Able to understand and use rate of perceived exertion (RPE) scale Increase Physical Activity;Able to understand and use Dyspnea scale;Understanding of Exercise Prescription;Increase Strength and Stamina;Knowledge and understanding of Target Heart Rate Range (THRR);Able to understand and use rate of perceived exertion (RPE) scale   Comments Jill Glenn is scheduled to begin Pulmonary Rehab again on 10/29. Will continue to monitor and progress as able. Jill Glenn has completed 6 exercise sessions. She exercises for 15 min on the recumbent elliptical and treadmill. She averages 2.6 METs at level 2 on the recumbent elliptical and 2.1 METs at 1.7 mph on the treadmill. She performs the warmup and cooldown standing without limitations. Jill Glenn has increased her workload for the recumbent elliptical as METs have remained the same. Jill Glenn has also progressed to treadmill walking. She did not tolerate treadmill walking on her first day of exercise but does now. Will continue to monitor and progress as able. Jill Glenn has completed 9 exercise sessions. She  exercises for 15 min on the recumbent elliptical and treadmill. She averages 3 METs at level 2 on the recumbent elliptical and 2.2 METs on the treadmill. She performs the warmup and cooldown standing without limitations. Jill Glenn has progressed to interval training on the treadmill. She  walks at 1.8 mph for 2 min and 2 mph and 3% incline for 4 min. Jill Glenn tolerates interval walking so far. Will increased level on recumbent elliptical. Will continue to monitor and progress as able. Jill Glenn has completed 14 exercise sessions. She exercises for 15 min on the recumbent elliptical and treadmill. She averages 2.7 METs at level 3 on the recumbent elliptical and 3.2 METs on the treadmill. She performs the warmup and cooldown standing without limitations. She has increased her interval training. Low speed and incline is 3 mph with 2% incline and 2 mph with 1.5 incine. Jill Glenn has not increased her recumbent elliptical level. Will encourage her to increase recumbent elliptical level. Will continue to monitor and progress as able. Jill Glenn has completed 20 exercise sessions. She exercises for 15 min on the recumbent elliptical and treadmill. She averages 3.5 METs at level 4 on the recumbent elliptical and 2.9 METs on the treadmill. She performs the warmup and cooldown standing without limitations. Jill Glenn has increased her level on the recumbent elliptical. She tolerates this progression wel. Her treadmill speed has also slightly increased. I asked Taresa if she wanted to discuss home exercise. Jill Glenn feels she has all the resources she needs to exercise at home. Will continue to monitor and progress as able.   Expected Outcomes Through exercise at rehab and home, the patient will decrease shortness of breath with daily activities and feel confident in carrying out an exercise regimen at home. Through exercise at rehab and home, the patient will decrease shortness of breath with daily activities and feel confident in carrying out an  exercise regimen at home. Through exercise at rehab and home, the patient will decrease shortness of breath with daily activities and feel confident in carrying out an exercise regimen at home. Through exercise at rehab and home, the patient will decrease shortness of breath with daily activities and feel confident in carrying out an exercise regimen at home. Through exercise at rehab and home, the patient will decrease shortness of breath with daily activities and feel confident in carrying out an exercise regimen at home.    Row Name 03/20/23 1914 04/12/23 1047           Exercise Goal Re-Evaluation   Exercise Goals Review Increase Physical Activity;Able to understand and use Dyspnea scale;Understanding of Exercise Prescription;Increase Strength and Stamina;Knowledge and understanding of Target Heart Rate Range (THRR);Able to understand and use rate of perceived exertion (RPE) scale Increase Physical Activity;Able to understand and use Dyspnea scale;Understanding of Exercise Prescription;Increase Strength and Stamina;Knowledge and understanding of Target Heart Rate Range (THRR);Able to understand and use rate of perceived exertion (RPE) scale      Comments Jill Glenn has completed 27 exercise sessions. She exercises for 15 min on the recumbent elliptical and treadmill. She averages 3.8 METs at level 4 on the recumbent elliptical and 3.6 METs on the treadmill. She performs the warmup and cooldown standing without limitations. Jill Glenn has increased her speed and incline on the treadmill as her intervals have changed. Jill Glenn does intervals on certain days. Will continue to monitor and progress as able. Jill Glenn has completed 33 exercise sessions. She exercises for 15 min on the recumbent elliptical and treadmill. She averages 3.3 METs at level 5 on the recumbent elliptical and 2.2-3.6 METs on the treadmill. She performs the warmup and cooldown standing without limitations. Jill Glenn has increased her level on the  recumbent elliptical as METs remain relatively the same. Her speed has increased in her high interval. She continue  to tolerate progressions well. Will continue to monitor and progress as able.      Expected Outcomes Through exercise at rehab and home, the patient will decrease shortness of breath with daily activities and feel confident in carrying out an exercise regimen at home. Through exercise at rehab and home, the patient will decrease shortness of breath with daily activities and feel confident in carrying out an exercise regimen at home.               Discharge Exercise Prescription (Final Exercise Prescription Changes):  Exercise Prescription Changes - 04/11/23 1451       Response to Exercise   Blood Pressure (Admit) 96/58    Blood Pressure (Exit) 92/60    Heart Rate (Admit) 69 bpm    Heart Rate (Exercise) 126 bpm    Heart Rate (Exit) 96 bpm    Oxygen Saturation (Admit) 99 %    Oxygen Saturation (Exercise) 90 %    Oxygen Saturation (Exit) 97 %    Rating of Perceived Exertion (Exercise) 13    Perceived Dyspnea (Exercise) 3    Duration Continue with 30 min of aerobic exercise without signs/symptoms of physical distress.    Intensity THRR unchanged      Progression   Progression Continue to progress workloads to maintain intensity without signs/symptoms of physical distress.      Resistance Training   Training Prescription Yes    Weight blue bands    Reps 10-15    Time 10 Minutes      Interval Training   Interval Training Yes    Equipment Treadmill    Comments 45min@2 .73mph&3%incline METS 3.6, 64min@1 .35mph&0%incline METS 2.2      Oxygen   Oxygen Continuous    Liters 2      Recumbant Elliptical   Level 5    RPM 56    Watts 75    Minutes 15    METs 3.3      Oxygen   Maintain Oxygen Saturation 88% or higher             Nutrition:  Target Goals: Understanding of nutrition guidelines, daily intake of sodium 1500mg , cholesterol 200mg , calories 30% from  fat and 7% or less from saturated fats, daily to have 5 or more servings of fruits and vegetables.  Biometrics:    Nutrition Therapy Plan and Nutrition Goals:  Nutrition Therapy & Goals - 04/11/23 1129       Nutrition Therapy   Diet General Healthy Diet      Personal Nutrition Goals   Nutrition Goal Patient to improve dietary quality by using the plate method as a daily guide for meal planning to include lean protein/plant protein, fruits, vegetables, whole grains, and nonfat/low fat dairy as part of well balanced diet   goal in progress.   Personal Goal #2 Patient to identify strategies for weight loss of 0.5-2.0# per week of weight loss.   goal not met.   Comments Goals in progress. Weight loss goal not met; she has maintained her weight. Abbi has medical history of pulmonary HTN, COPD3, chronic respiratory failure. Per [facility] transplant RD documentation on 10/05/22, it is recommended that she lose weight to BMI 30/170# and ultimately BMI 27/150#. She does report history of over snacking on refined carbohydates, snacking in the middle of the night, etc. She has previously completed pulmonary rehab in February 2024; her starting weight at that time was 69.8kg/153.6#. She has maintained weight over the last ~6 months. She has  previously used My[employer]itness Pal app to aid with tracking calories. She has maintained her weight since starting with our program. She does acknowledge some emotional eating/binge type eating tendencies; she has started seeing behavioral health counselor.  Denesha would continue to benefit from weight loss and decrease intake of refined carbohydrates to support pulmonary disease.      Intervention Plan   Intervention Prescribe, educate and counsel regarding individualized specific dietary modifications aiming towards targeted core components such as weight, hypertension, lipid management, diabetes, heart failure and other comorbidities.;Nutrition handout(s) given to patient.     Expected Outcomes Short Term Goal: Understand basic principles of dietary content, such as calories, fat, sodium, cholesterol and nutrients.;Long Term Goal: Adherence to prescribed nutrition plan.             Nutrition Assessments:  MEDIFICTS Score Key: >=70 Need to make dietary changes  40-70 Heart Healthy Diet <= 40 Therapeutic Level Cholesterol Diet  Flowsheet Row PULMONARY REHAB CHRONIC OBSTRUCTIVE PULMONARY DISEASE from 02/22/2022 in [facility]  Picture Your Plate Total Score on Discharge 50      Picture Your Plate Scores: <82 Unhealthy dietary pattern with much room for improvement. 41-50 Dietary pattern unlikely to meet recommendations for good health and room for improvement. 51-60 More healthful dietary pattern, with some room for improvement.  >60 Healthy dietary pattern, although there may be some specific behaviors that could be improved.    Nutrition Goals Re-Evaluation:  Nutrition Goals Re-Evaluation     Row Name 11/01/22 1307 11/29/22 1532 03/13/23 1439 04/11/23 1129       Goals   Current Weight 177 lb 14.6 oz (80.7 kg) 177 lb 14.6 oz (80.7 kg) 178 lb 5.6 oz (80.9 kg) 176 lb 12.9 oz (80.2 kg)    Comment LDL 134, cholesterol 208, N5A 5.8 A1c 5.9; other most recent labs  LDL 134, cholesterol 208 A1c 5.9; other most recent labs LDL 134, cholesterol 208 A1c 5.9; other most recent labs LDL 134, cholesterol 208    Expected Outcome Jill Glenn has medical history of pulmonary HTN, COPD3, chronic respiratory failure. Per [facility] transplant RD documentation on 10/05/22, it is recommended that she lose weight to BMI 30/170# and ultimately BMI 27/150#. She does report history of over snacking on refined carbohydates, snacking in the middle of the night, etc. She has previously completed pulmonary rehab in February 2024; her starting weight at that time was 69.8kg/153.6#. She reports using MyFitness Pal app to aid with tracking  calories with goal set at ~1200kcals per patient. Shantea would continue to benefit from weight loss and decrease intake of refined carbohydrates to support pulmonary disease. Goals in progress. Jill Glenn has medical history of pulmonary HTN, COPD3, chronic respiratory failure. Per [facility] transplant RD documentation on 10/05/22, it is recommended that she lose weight to BMI 30/170# and ultimately BMI 27/150#. She does report history of over snacking on refined carbohydates, snacking in the middle of the night, etc. She has previously completed pulmonary rehab in February 2024; her starting weight at that time was 69.8kg/153.6#. She reports using My[employer]itness Pal app to aid with tracking calories with goal set at ~1200kcals per patient. She has maintained her weight since starting with our program. She does acknowledge some emotional eating/binge type eating tendencies; she will begin seeing a behavioral health counselor. Lorelle would continue to benefit from weight loss and decrease intake of refined carbohydrates to support pulmonary disease. Goals in progress. Weight loss goal not met.  Tamari has medical history of pulmonary HTN, COPD3, chronic respiratory failure. Per [facility] transplant RD documentation on 10/05/22, it is recommended that she lose weight to BMI 30/170# and ultimately BMI 27/150#. She does report history of over snacking on refined carbohydates, snacking in the middle of the night, etc. She has previously completed pulmonary rehab in February 2024; her starting weight at that time was 69.8kg/153.6#. She has maintained weight over the last ~5 months. She has previously used My[employer]itness Pal app to aid with tracking calories. She has maintained her weight since starting with our program. She does acknowledge some emotional eating/binge type eating tendencies; she has started seeing behavioral health counselor. Rainee would continue to benefit from weight loss and decrease intake of refined carbohydrates to support  pulmonary disease. Goals in progress. Weight loss goal not met; she has maintained her weight. Aesha has medical history of pulmonary HTN, COPD3, chronic respiratory failure. Per [facility] transplant RD documentation on 10/05/22, it is recommended that she lose weight to BMI 30/170# and ultimately BMI 27/150#. She does report history of over snacking on refined carbohydates, snacking in the middle of the night, etc. She has previously completed pulmonary rehab in February 2024; her starting weight at that time was 69.8kg/153.6#. She has maintained weight over the last ~6 months. She has previously used My[employer]itness Pal app to aid with tracking calories. She has maintained her weight since starting with our program. She does acknowledge some emotional eating/binge type eating tendencies; she has started seeing behavioral health counselor. Lareina would continue to benefit from weight loss and decrease intake of refined carbohydrates to support pulmonary disease.             Nutrition Goals Discharge (Final Nutrition Goals Re-Evaluation):  Nutrition Goals Re-Evaluation - 04/11/23 1129       Goals   Current Weight 176 lb 12.9 oz (80.2 kg)    Comment A1c 5.9; other most recent labs LDL 134, cholesterol 208    Expected Outcome Goals in progress. Weight loss goal not met; she has maintained her weight. Devaney has medical history of pulmonary HTN, COPD3, chronic respiratory failure. Per [facility] transplant RD documentation on 10/05/22, it is recommended that she lose weight to BMI 30/170# and ultimately BMI 27/150#. She does report history of over snacking on refined carbohydates, snacking in the middle of the night, etc. She has previously completed pulmonary rehab in February 2024; her starting weight at that time was 69.8kg/153.6#. She has maintained weight over the last ~6 months. She has previously used My[employer]itness Pal app to aid with tracking calories. She has maintained her weight since starting with our program. She  does acknowledge some emotional eating/binge type eating tendencies; she has started seeing behavioral health counselor. Raha would continue to benefit from weight loss and decrease intake of refined carbohydrates to support pulmonary disease.             Psychosocial: Target Goals: Acknowledge presence or absence of significant depression and/or stress, maximize coping skills, provide positive support system. Participant is able to verbalize types and ability to use techniques and skills needed for reducing stress and depression.  Initial Review & Psychosocial Screening:  Initial Psych Review & Screening - 10/26/22 1103       Initial Review   Current issues with Current Depression;Current Anxiety/Panic;History of Depression;Current Stress Concerns    Comments Pt is stressed about her health and the ability to not work anymore.      Family Dynamics   Good Support System?  Yes    Comments Husband, Jill Glenn, 2 daughters      Barriers   Psychosocial barriers to participate in program Psychosocial barriers identified (see note);The patient should benefit from training in stress management and relaxation.      Screening Interventions   Interventions Encouraged to exercise;To provide support and resources with identified psychosocial needs    Expected Outcomes Short Term goal: Utilizing psychosocial counselor, staff and physician to assist with identification of specific Stressors or current issues interfering with healing process. Setting desired goal for each stressor or current issue identified.;Long Term Goal: Stressors or current issues are controlled or eliminated.;Short Term goal: Identification and review with participant of any Quality of Life or Depression concerns found by scoring the questionnaire.;Long Term goal: The participant improves quality of Life and PHQ9 Scores as seen by post scores and/or verbalization of changes             Quality of Life Scores:  Scores of  19 and below usually indicate a poorer quality of life in these areas.  A difference of  2-3 points is a clinically meaningful difference.  A difference of 2-3 points in the total score of the Quality of Life Index has been associated with significant improvement in overall quality of life, self-image, physical symptoms, and general health in studies assessing change in quality of life.  PHQ-9: Review Flowsheet  More data exists      03/29/2023 03/16/2023 03/01/2023 02/15/2023 01/18/2023  Depression screen PHQ 2/9  Decreased Interest       Down, Depressed, Hopeless       PHQ - 2 Score       Altered sleeping       Tired, decreased energy       Change in appetite       Feeling bad or failure about yourself        Trouble concentrating       Moving slowly or fidgety/restless       Suicidal thoughts       PHQ-9 Score       Difficult doing work/chores         Details       Information is confidential and restricted. Go to Review Flowsheets to unlock data.        Interpretation of Total Score  Total Score Depression Severity:  1-4 = Minimal depression, 5-9 = Mild depression, 10-14 = Moderate depression, 15-19 = Moderately severe depression, 20-27 = Severe depression   Psychosocial Evaluation and Intervention:  Psychosocial Evaluation - 10/26/22 1105       Psychosocial Evaluation & Interventions   Interventions Stress management education;Encouraged to exercise with the program and follow exercise prescription    Comments Icess is currently stressed about her health and not being able to work anymore. 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 She has finally been approved for disability so this has helped to decrease some of her stress. She is currently being worked up for a lung transplant at [facility]. She does admit to feeling depressed, but states she is getting ready to start therapy. She is also going to ask her therapist about starting psychotropic  meds.    Expected Outcomes For Kolina to participate in PR free of any psychosocial barreris or concerns    Continue Psychosocial Services  Follow up required by staff             Psychosocial Re-Evaluation:  Psychosocial Re-Evaluation  Row Name 10/28/22 1521 11/23/22 1130 12/16/22 0924 01/18/23 1535 02/15/23 1034     Psychosocial Re-Evaluation   Current issues with Current Stress Concerns;Current Anxiety/Panic;History of Depression;Current Depression Current Stress Concerns;Current Anxiety/Panic;History of Depression;Current Depression Current Depression;History of Depression;Current Psychotropic Meds;Current Anxiety/Panic;Current Stress Concerns Current Depression;History of Depression;Current Psychotropic Meds;Current Anxiety/Panic;Current Stress Concerns Current Depression;History of Depression;Current Psychotropic Meds;Current Anxiety/Panic;Current Stress Concerns   Comments No changes since orientation on 10/26/22. Kiya is scheduled to start PR on 10/29 Fanta has met with her psychiatrist and has also been referred to a counseler. No needs at this time. Yazmyne denies any new psychosocial barriers or concerns. She has started taking psychotropic meds to help with her anxiety and depression. She is also working with a therapist to help her navigate her feelings of worry/anger. Estee is currently struggling with making the decision to go through with an endobronchial valve replacement. She states her husband does not want her to due to the risk associated with the procedure. This decision she has to make has increased her stress level. She is still working with her mental health therapist and taking her psychotropic meds. She states she feels her depression is a little better. Sivan has made the decision not to go through with the endobronchial valve replacement. She felt the risk outweighed the benefits. She has peace about this decision. She is going to go through with the lung  transplant evaluation which she is hopeful about. She feels her depression is stable at this time. She is compliant with taking her psychotropic meds and meeting with her mental health specialist.  No new barriers or concerns at this time.   Expected Outcomes For Genetta to attend the program without psychosocial issues or concerns. For Shanera to attend the program with less psychosocial issues or concerns. For Tyara to attend the program with less psychosocial issues or concerns. For Deolinda to attend the program with less psychosocial issues or concerns. For Judithe to attend the program with less psychosocial issues or concerns.   Interventions Encouraged to attend Pulmonary Rehabilitation for the exercise;Stress management education;Relaxation education Encouraged to attend Pulmonary Rehabilitation for the exercise Encouraged to attend Pulmonary Rehabilitation for the exercise;Stress management education Encouraged to attend Pulmonary Rehabilitation for the exercise;Stress management education Encouraged to attend Pulmonary Rehabilitation for the exercise   Continue Psychosocial Services  Follow up required by staff Follow up required by staff Follow up required by staff Follow up required by staff Follow up required by staff    Row Name 03/15/23 1118 04/10/23 1429           Psychosocial Re-Evaluation   Current issues with Current Depression;History of Depression;Current Psychotropic Meds;Current Stress Concerns Current Depression;History of Depression;Current Psychotropic Meds;Current Stress Concerns      Comments Jill Glenn denies any new psychosocial barriers or concerns at this time. She is still working with her therapist and taking her psychotropic meds. Jill Glenn denies any new psychosocial barriers or concerns at this time. She is still working with her therapist and taking her psychotropic meds.      Expected Outcomes For Heavenly to attend the program with less psychosocial issues or concerns. For  Cyani to attend the program with less psychosocial issues or concerns.      Interventions Encouraged to attend Pulmonary Rehabilitation for the exercise Encouraged to attend Pulmonary Rehabilitation for the exercise      Continue Psychosocial Services  No Follow up required No Follow up required  Psychosocial Discharge (Final Psychosocial Re-Evaluation):  Psychosocial Re-Evaluation - 04/10/23 1429       Psychosocial Re-Evaluation   Current issues with Current Depression;History of Depression;Current Psychotropic Meds;Current Stress Concerns    Comments Jill Glenn denies any new psychosocial barriers or concerns at this time. She is still working with her therapist and taking her psychotropic meds.    Expected Outcomes For Yoselyn to attend the program with less psychosocial issues or concerns.    Interventions Encouraged to attend Pulmonary Rehabilitation for the exercise    Continue Psychosocial Services  No Follow up required             Education: Education Goals: Education classes will be provided on a weekly basis, covering required topics. Participant will state understanding/return demonstration of topics presented.  Learning Barriers/Preferences:  Learning Barriers/Preferences - 10/26/22 1105       Learning Barriers/Preferences   Learning Barriers None    Learning Preferences Audio;Group Instruction;Individual Instruction;Verbal Instruction;Video;Written Material             Education Topics: Know Your Numbers Group instruction that is supported by a PowerPoint presentation. Instructor discusses importance of knowing and understanding resting, exercise, and post-exercise oxygen saturation, heart rate, and blood pressure. Oxygen saturation, heart rate, blood pressure, rating of perceived exertion, and dyspnea are reviewed along with a normal range for these values.  Flowsheet Row PULMONARY REHAB CHRONIC OBSTRUCTIVE PULMONARY DISEASE from 01/05/2023 in  [facility], V[facility], & [facility]  Date 01/05/23  Educator EP  Instruction Review Code 1- Verbalizes Understanding       Exercise for the Pulmonary Patient Group instruction that is supported by a PowerPoint presentation. Instructor discusses benefits of exercise, core components of exercise, frequency, duration, and intensity of an exercise routine, importance of utilizing pulse oximetry during exercise, safety while exercising, and options of places to exercise outside of rehab.  Flowsheet Row PULMONARY REHAB CHRONIC OBSTRUCTIVE PULMONARY DISEASE from 03/30/2023 in [facility] for Heart, Vascular, & Lung Health  Date 03/30/23  Educator EP  Instruction Review Code 1- Verbalizes Understanding       MET Level  Group instruction provided by PowerPoint, verbal discussion, and written material to support subject matter. Instructor reviews what METs are and how to increase METs.  Flowsheet Row PULMONARY REHAB CHRONIC OBSTRUCTIVE PULMONARY DISEASE from 11/24/2022 in [facility] for Heart, Vascular, & Lung Health  Date 11/24/22  Educator EP  Instruction Review Code 1- Verbalizes Understanding       Pulmonary Medications Verbally interactive group education provided by instructor with focus on inhaled medications and proper administration. Flowsheet Row PULMONARY REHAB CHRONIC OBSTRUCTIVE PULMONARY DISEASE from 12/22/2022 in [facility] for Heart, Vascular, & Lung Health  Date 12/22/22  Educator RT  Instruction Review Code 1- Verbalizes Understanding       Anatomy and Physiology of the Respiratory System Group instruction provided by PowerPoint, verbal discussion, and written material to support subject matter. Instructor reviews respiratory cycle and anatomical components of the respiratory system and their functions. Instructor also reviews differences in obstructive and  restrictive respiratory diseases with examples of each.  Flowsheet Row PULMONARY REHAB CHRONIC OBSTRUCTIVE PULMONARY DISEASE from 12/15/2022 in [facility] for Heart, Vascular, & Lung Health  Date 12/15/22  Educator RT  Instruction Review Code 1- Verbalizes Understanding       Oxygen Safety Group instruction provided by PowerPoint, verbal discussion, and written material to support  subject matter. There is an overview of "What is Oxygen" and "Why do we need it".  Instructor also reviews how to create a safe environment for oxygen use, the importance of using oxygen as prescribed, and the risks of noncompliance. There is a brief discussion on traveling with oxygen and resources the patient may utilize. Flowsheet Row PULMONARY REHAB CHRONIC OBSTRUCTIVE PULMONARY DISEASE from 01/27/2022 in [facility] for Heart, Vascular, & Lung Health  Date 01/27/22  Educator RN  Instruction Review Code 1- Verbalizes Understanding       Oxygen Use Group instruction provided by PowerPoint, verbal discussion, and written material to discuss how supplemental oxygen is prescribed and different types of oxygen supply systems. Resources for more information are provided.  Flowsheet Row PULMONARY REHAB CHRONIC OBSTRUCTIVE PULMONARY DISEASE from 01/19/2023 in [facility] for Heart, Vascular, & Lung Health  Date 01/19/23  Educator RT  Instruction Review Code 1- Verbalizes Understanding       Breathing Techniques Group instruction that is supported by demonstration and informational handouts. Instructor discusses the benefits of pursed lip and diaphragmatic breathing and detailed demonstration on how to perform both.  Flowsheet Row PULMONARY REHAB CHRONIC OBSTRUCTIVE PULMONARY DISEASE from 01/26/2023 in [facility] for Heart, Vascular, & Lung Health  Date 01/26/23  Educator RN  Instruction Review Code 1- Verbalizes  Understanding        Risk Factor Reduction Group instruction that is supported by a PowerPoint presentation. Instructor discusses the definition of a risk factor, different risk factors for pulmonary disease, and how the heart and lungs work together. Flowsheet Row PULMONARY REHAB CHRONIC OBSTRUCTIVE PULMONARY DISEASE from 02/16/2023 in [facility] for Heart, Vascular, & Lung Health  Date 02/16/23  Educator EP  Instruction Review Code 1- Verbalizes Understanding       Pulmonary Diseases Group instruction provided by PowerPoint, verbal discussion, and written material to support subject matter. Instructor gives an overview of the different type of pulmonary diseases. There is also a discussion on risk factors and symptoms as well as ways to manage the diseases. Flowsheet Row PULMONARY REHAB CHRONIC OBSTRUCTIVE PULMONARY DISEASE from 03/23/2023 in [facility] for Heart, Vascular, & Lung Health  Date 03/23/23  Educator RT  Instruction Review Code 1- Verbalizes Understanding       Stress and Energy Conservation Group instruction provided by PowerPoint, verbal discussion, and written material to support subject matter. Instructor gives an overview of stress and the impact it can have on the body. Instructor also reviews ways to reduce stress. There is also a discussion on energy conservation and ways to conserve energy throughout the day. Flowsheet Row PULMONARY REHAB CHRONIC OBSTRUCTIVE PULMONARY DISEASE from 02/02/2023 in [facility] for Heart, Vascular, & Lung Health  Date 02/02/23  Educator RN  Instruction Review Code 1- Verbalizes Understanding       Warning Signs and Symptoms Group instruction provided by PowerPoint, verbal discussion, and written material to support subject matter. Instructor reviews warning signs and symptoms of stroke, heart attack, cold and flu. Instructor also reviews ways to prevent the  spread of infection. Flowsheet Row PULMONARY REHAB CHRONIC OBSTRUCTIVE PULMONARY DISEASE from 11/10/2022 in [facility] for Heart, Vascular, & Lung Health  Date 11/10/22  Educator RN  Instruction Review Code 1- Verbalizes Understanding       Other Education Group or individual verbal, written, or video instructions that support the educational  goals of the pulmonary rehab program. Flowsheet Row PULMONARY REHAB CHRONIC OBSTRUCTIVE PULMONARY DISEASE from 03/16/2023 in [facility] for Heart, Vascular, & Lung Health  Date 03/16/23  Educator EP  Instruction Review Code 1- Verbalizes Understanding        Knowledge Questionnaire Score:  Knowledge Questionnaire Score - 10/26/22 1135       Knowledge Questionnaire Score   Pre Score 17/18             Core Components/Risk Factors/Patient Goals at Admission:  Personal Goals and Risk Factors at Admission - 10/26/22 1105       Core Components/Risk Factors/Patient Goals on Admission    Weight Management Yes;Weight Loss   pt wants to lose 10lbs   Intervention Weight Management: Develop a combined nutrition and exercise program designed to reach desired caloric intake, while maintaining appropriate intake of nutrient and fiber, sodium and fats, and appropriate energy expenditure required for the weight goal.;Weight Management: Provide education and appropriate resources to help participant work on and attain dietary goals.;Weight Management/Obesity: Establish reasonable short term and long term weight goals.;Obesity: Provide education and appropriate resources to help participant work on and attain dietary goals.    Expected Outcomes Short Term: Continue to assess and modify interventions until short term weight is achieved;Long Term: Adherence to nutrition and physical activity/exercise program aimed toward attainment of established weight goal;Weight Loss: Understanding of general recommendations  for a balanced deficit meal plan, which promotes 1-2 lb weight loss per week and includes a negative energy balance of [phone] kcal/d;Understanding recommendations for meals to include 15-35% energy as protein, 25-35% energy from fat, 35-60% energy from carbohydrates, less than 200mg  of dietary cholesterol, 20-35 gm of total fiber daily;Understanding of distribution of calorie intake throughout the day with the consumption of 4-5 meals/snacks    Improve shortness of breath with ADL's Yes    Intervention Provide education, individualized exercise plan and daily activity instruction to help decrease symptoms of SOB with activities of daily living.    Expected Outcomes Short Term: Improve cardiorespiratory fitness to achieve a reduction of symptoms when performing ADLs;Long Term: Be able to perform more ADLs without symptoms or delay the onset of symptoms    Stress Yes    Intervention Offer individual and/or small group education and counseling on adjustment to heart disease, stress management and health-related lifestyle change. Teach and support self-help strategies.;Refer participants experiencing significant psychosocial distress to appropriate mental health specialists for further evaluation and treatment. When possible, include family members and significant others in education/counseling sessions.    Expected Outcomes Short Term: Participant demonstrates changes in health-related behavior, relaxation and other stress management skills, ability to obtain effective social support, and compliance with psychotropic medications if prescribed.;Long Term: Emotional wellbeing is indicated by absence of clinically significant psychosocial distress or social isolation.             Core Components/Risk Factors/Patient Goals Review:   Goals and Risk Factor Review     Row Name 10/28/22 1523 11/23/22 1137 12/16/22 0928 01/18/23 1539 02/15/23 1037     Core Components/Risk Factors/Patient Goals Review    Personal Goals Review Weight Management/Obesity;Improve shortness of breath with ADL's;Develop more efficient breathing techniques such as purse lipped breathing and diaphragmatic breathing and practicing self-pacing with activity.;Stress Weight Management/Obesity;Improve shortness of breath with ADL's;Develop more efficient breathing techniques such as purse lipped breathing and diaphragmatic breathing and practicing self-pacing with activity. Weight Management/Obesity;Improve shortness of breath with ADL's;Develop more efficient breathing techniques such as  purse lipped breathing and diaphragmatic breathing and practicing self-pacing with activity.;Stress Weight Management/Obesity;Improve shortness of breath with ADL's Weight Management/Obesity;Improve shortness of breath with ADL's;Stress   Review Unable to assess. Hensley is scheduled to start PR on 11/01/22 Goal progressing for weight loss. Ichelle is working with staff dietitian to achieve her weight loss goals. Goal progressing on improving shortness of breath with ADL's. Goal progressing on developing more efficient breathing techniques such as purse lipped breathing and diaphragmatic breathing; and practicing self-pacing with activity. increase knowledge or respiratory medications and ability to use respiratory devices properly. Deretha is requiring 2L of O2 to maintain sats >88%. She has also been able to transition from walking the track to walking on the treadmill. We will continue to monitor Cloa's progress throughout the program. Goal progressing for weight loss. Goal progressing on improving shortness of breath with ADL's. Goal progressing on developing more efficient breathing techniques such as purse lipped breathing and diaphragmatic breathing; and practicing self-pacing with activity. Goal met on increasing knowledge of respiratory medications and ability to use respiratory devices properly. Adaja has demonstrated proper use of MDI to staff.   Goal progressing on decreasing stress. Angla is currently working with a  therapist on her mental health issues. She has also started taking psychotropic meds. Ayn is requiring 2L of O2 to maintain sats >88% while exercising. We will continue to monitor Elyssa's progress throughout the program. Goal progressing for weight loss. Goal progressing on improving shortness of breath with ADL's. Goal met on developing more efficient breathing techniques such as purse lipped breathing and diaphragmatic breathing; and practicing self-pacing with activity. Cherilynn is able to demonstrate purse lip breathing when she gets short of breath. Jahleah is also able to pace herself based on her rate of perceived exertion scale. Goal progressing on decreasing stress.  Lowana is requiring 2L of O2 to maintain sats >88% while exercising. We will continue to monitor Corazon's progress throughout the program. Goal progressing for weight loss. Goal progressing on improving shortness of breath with ADL's. Goal progressing on decreasing stress.  Gracin is requiring 2L of O2 to maintain sats >88% while exercising. She is currently exercising on the recumbant elliptical and the treadmill. We will continue to monitor Janna's progress throughout the program.   Expected Outcomes For Nashira to lose weight, improve her SOB with ADLs, decrease stress, and develop more efficient breathing techniques such as purse lipped breathing and diaphragmatic breathing; and practicing self-pacing with activity For Tyneshia to lose weight, improve her SOB with ADLs, decrease stress, and develop more efficient breathing techniques such as purse lipped breathing and diaphragmatic breathing; and practicing self-pacing with activity For Arretta to lose weight, improve her SOB with ADLs, decrease stress, and develop more efficient breathing techniques such as purse lipped breathing and diaphragmatic breathing; and practicing self-pacing with activity For Zayne to lose  weight, improve her SOB with ADLs and decrease stress For Marybell to lose weight, improve her SOB with ADLs and decrease stress    Row Name 03/15/23 1120 04/10/23 1430           Core Components/Risk Factors/Patient Goals Review   Personal Goals Review Weight Management/Obesity;Improve shortness of breath with ADL's;Stress Weight Management/Obesity;Improve shortness of breath with ADL's      Review Goal progressing for weight loss. Allecia is working with the staff dietician to obatain her weight loss goals. Goal progressing on improving shortness of breath with ADL's. Goal met on decreasing stress. She states that exercising has helped reduce her  stress and she feels like her stress level is under control. Kattie is requiring 2L of O2 to maintain sats >88% while exercising. She is currently exercising on the recumbant elliptical and the treadmill. We will continue to monitor Dwana's progress throughout the program. Monthly review of patient's Core Components/Risk Factors/Patient Goals are as follows: Goal progressing for weight loss. Letishia is working with the staff dietitian to obatain her weight loss goals. Goal progressing on improving shortness of breath with ADL's. Matilde is requiring 2L of O2 to maintain sats >88% while exercising. She is currently exercising on the recumbant elliptical and the treadmill. We will continue to monitor Tiajah's progress throughout the program.      Expected Outcomes For Kapri to lose weight and improve her SOB with ADLs For Merrill to lose weight and improve her SOB with ADLs               Core Components/Risk Factors/Patient Goals at Discharge (Final Review):   Goals and Risk Factor Review - 04/10/23 1430       Core Components/Risk Factors/Patient Goals Review   Personal Goals Review Weight Management/Obesity;Improve shortness of breath with ADL's    Review Monthly review of patient's Core Components/Risk Factors/Patient Goals are as follows: Goal  progressing for weight loss. Aunna is working with the staff dietitian to obatain her weight loss goals. Goal progressing on improving shortness of breath with ADL's. Candiss is requiring 2L of O2 to maintain sats >88% while exercising. She is currently exercising on the recumbant elliptical and the treadmill. We will continue to monitor Janayia's progress throughout the program.    Expected Outcomes For Tache to lose weight and improve her SOB with ADLs             ITP Comments: Pt is making expected progress toward Pulmonary Rehab goals after completing 33 session(s). Recommend continued exercise, life style modification, education, and utilization of breathing techniques to increase stamina and strength, while also decreasing shortness of breath with exertion.  Dr. Genetta Kenning is Medical Director for Pulmonary Rehab at [facility].

## 2023-04-20 ENCOUNTER — Encounter (HOSPITAL_COMMUNITY)

## 2023-04-25 ENCOUNTER — Encounter (HOSPITAL_COMMUNITY)
Admission: RE | Admit: 2023-04-25 | Discharge: 2023-04-25 | Disposition: A | Discharge status: Home / Self Care | Source: Ambulatory Visit | Attending: Internal Medicine | Admitting: Internal Medicine

## 2023-04-25 DIAGNOSIS — J449 Chronic obstructive pulmonary disease, unspecified: Secondary | ICD-10-CM | POA: Diagnosis not present

## 2023-04-25 NOTE — Progress Notes (Signed)
 Daily Session Note  Patient Details  Name: Jill Glenn MRN: [id number] Date of Birth: 07/05/1965 Referring Provider:   Gattis Kass Pulmonary Rehab Walk Test from 10/26/2022 in [facility] for Heart, Vascular, & Lung Health  Referring Provider Dione Franks       Encounter Date: 04/25/2023  Check In:  Session Check In - 04/25/23 1021       Check-In   Supervising physician immediately available to respond to emergencies [facility] MD immediately available    Physician(s) Marlana Silvan, NP    Location [facility]    Staff Present Cindra Cree, Emilio Harder, MS, ACSM-CEP, Exercise Physiologist;Randi Rochelle Chu, ACSM-CEP, Exercise Physiologist;Samantha Belarus, RD, LDN    Virtual Visit No    Medication changes reported     No    Fall or balance concerns reported    No    Tobacco Cessation No Change    Warm-up and Cool-down Performed as group-led instruction    Resistance Training Performed Yes    VAD Patient? No    PAD/SET Patient? No      Pain Assessment   Currently in Pain? No/denies    Multiple Pain Sites No             Capillary Blood Glucose: No results found for this or any previous visit (from the past 24 hours).    Social History   Tobacco Use  Smoking Status Former   Current packs/day: 0.00   Average packs/day: 1 pack/day for 40.6 years (40.6 ttl pk-yrs)   Types: Cigarettes   Start date: 03/05/1981   Quit date: 10/14/2021   Years since quitting: 1.5  Smokeless Tobacco Never    Goals Met:  Proper associated with RPD/PD & O2 Sat Independence with exercise equipment Exercise tolerated well No report of concerns or symptoms today Strength training completed today  Goals Unmet:  Not Applicable  Comments: Service time is from 1008 to 1130.    Dr. Genetta Kenning is Medical Director for Pulmonary Rehab at [facility].

## 2023-04-26 ENCOUNTER — Ambulatory Visit (INDEPENDENT_AMBULATORY_CARE_PROVIDER_SITE_OTHER): Admitting: Licensed Clinical Social Worker

## 2023-04-26 DIAGNOSIS — Z91199 Patient's noncompliance with other medical treatment and regimen due to unspecified reason: Secondary | ICD-10-CM

## 2023-04-26 NOTE — Progress Notes (Signed)
 THERAPIST PROGRESS NOTE   Session Date: 04/26/2023  Session Time: 1500  Patient no-showed today's appointment; appointment was for follow up therapy services. Pt shared at previous session of potential scheduling conflicts due to frequent pulmonary and cardiac rehab visits.     Patsi Boots, MSW, LCSW 04/26/2023,  4:54 PM

## 2023-04-27 ENCOUNTER — Ambulatory Visit (HOSPITAL_COMMUNITY): Admitting: Licensed Clinical Social Worker

## 2023-04-27 ENCOUNTER — Encounter (HOSPITAL_COMMUNITY)
Admission: RE | Admit: 2023-04-27 | Discharge: 2023-04-27 | Disposition: A | Discharge status: Home / Self Care | Source: Ambulatory Visit | Attending: Internal Medicine | Admitting: Internal Medicine

## 2023-04-27 DIAGNOSIS — J449 Chronic obstructive pulmonary disease, unspecified: Secondary | ICD-10-CM

## 2023-04-27 NOTE — Progress Notes (Signed)
 Daily Session Note  Patient Details  Name: Jill Glenn MRN: [id number] Date of Birth: 11/13/1965 Referring Provider:   Gattis Kass Pulmonary Rehab Walk Test from 10/26/2022 in [facility] for Heart, Vascular, & Lung Health  Referring Provider Dione Franks       Encounter Date: 04/27/2023  Check In:  Session Check In - 04/27/23 1023       Check-In   Supervising physician immediately available to respond to emergencies [facility] MD immediately available    Physician(s) Lawana Pray, NP    [facility]    Staff Present Cindra Cree, Emilio Harder, MS, ACSM-CEP, Exercise Physiologist;Joyanna Kleman Rochelle Chu, ACSM-CEP, Exercise Physiologist;Mary Arlester Ladd, RN, BSN    Virtual Visit No    Medication changes reported     No    Fall or balance concerns reported    No    Tobacco Cessation No Change    Warm-up and Cool-down Performed as group-led instruction    Resistance Training Performed Yes    VAD Patient? No    PAD/SET Patient? No      Pain Assessment   Currently in Pain? No/denies    Multiple Pain Sites No             Capillary Blood Glucose: No results found for this or any previous visit (from the past 24 hours).    Social History   Tobacco Use  Smoking Status Former   Current packs/day: 0.00   Average packs/day: 1 pack/day for 40.6 years (40.6 ttl pk-yrs)   Types: Cigarettes   Start date: 03/05/1981   Quit date: 10/14/2021   Years since quitting: 1.5  Smokeless Tobacco Never    Goals Met:  Independence with exercise equipment Exercise tolerated well No report of concerns or symptoms today Strength training completed today  Goals Unmet:  Not Applicable  Comments: Service time is from 1007 to 1137.    Dr. Genetta Kenning is Medical Director for Pulmonary Rehab at [facility].
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
## 2023-05-01 ENCOUNTER — Ambulatory Visit (HOSPITAL_BASED_OUTPATIENT_CLINIC_OR_DEPARTMENT_OTHER): Admitting: Student

## 2023-05-01 DIAGNOSIS — F4310 Post-traumatic stress disorder, unspecified: Secondary | ICD-10-CM | POA: Diagnosis not present

## 2023-05-01 DIAGNOSIS — F331 Major depressive disorder, recurrent, moderate: Secondary | ICD-10-CM

## 2023-05-01 DIAGNOSIS — F411 Generalized anxiety disorder: Secondary | ICD-10-CM | POA: Diagnosis not present

## 2023-05-02 ENCOUNTER — Encounter (HOSPITAL_COMMUNITY)
Admission: RE | Admit: 2023-05-02 | Discharge: 2023-05-02 | Disposition: A | Discharge status: Home / Self Care | Source: Ambulatory Visit | Attending: Internal Medicine | Admitting: Internal Medicine

## 2023-05-02 VITALS — Wt 174.6 lb

## 2023-05-02 DIAGNOSIS — J449 Chronic obstructive pulmonary disease, unspecified: Secondary | ICD-10-CM

## 2023-05-03 DIAGNOSIS — R0602 Shortness of breath: Secondary | ICD-10-CM | POA: Diagnosis not present

## 2023-05-03 DIAGNOSIS — J432 Centrilobular emphysema: Secondary | ICD-10-CM | POA: Diagnosis not present

## 2023-05-03 DIAGNOSIS — J449 Chronic obstructive pulmonary disease, unspecified: Secondary | ICD-10-CM | POA: Diagnosis not present

## 2023-05-04 ENCOUNTER — Encounter (HOSPITAL_COMMUNITY)
Admission: RE | Admit: 2023-05-04 | Discharge: 2023-05-04 | Disposition: A | Discharge status: Home / Self Care | Source: Ambulatory Visit | Attending: Internal Medicine | Admitting: Internal Medicine

## 2023-05-04 DIAGNOSIS — J449 Chronic obstructive pulmonary disease, unspecified: Secondary | ICD-10-CM | POA: Insufficient documentation

## 2023-05-04 DIAGNOSIS — R079 Chest pain, unspecified: Secondary | ICD-10-CM | POA: Diagnosis not present

## 2023-05-04 DIAGNOSIS — J4489 Other specified chronic obstructive pulmonary disease: Secondary | ICD-10-CM | POA: Insufficient documentation

## 2023-05-04 DIAGNOSIS — J439 Emphysema, unspecified: Secondary | ICD-10-CM | POA: Insufficient documentation

## 2023-05-04 NOTE — Progress Notes (Signed)
 Daily Session Note  Patient Details  Name: Jill Glenn MRN: [id number] Date of Birth: 05-18-1965 Referring Provider:   Gattis Kass Pulmonary Rehab Walk Test from 10/26/2022 in [facility] for Heart, Vascular, & Lung Health  Referring Provider Dione Franks       Encounter Date: 05/04/2023  Check In:  Session Check In - 05/04/23 1036       Check-In   Supervising physician immediately available to respond to emergencies [facility] MD immediately available    Physician(s) Koren Persons, NP    Location [facility]    Staff Present Cindra Cree, Emilio Harder, MS, ACSM-CEP, Exercise Physiologist;Randi Rochelle Chu, ACSM-CEP, Exercise Physiologist;Mary Arlester Ladd, RN, Mercedes Stake, RN, BSN    Virtual Visit No    Medication changes reported     No    Fall or balance concerns reported    No    Tobacco Cessation No Change    Warm-up and Cool-down Performed as group-led Writer Performed Yes    VAD Patient? No    PAD/SET Patient? No      Pain Assessment   Currently in Pain? No/denies    Multiple Pain Sites No             Capillary Blood Glucose: No results found for this or any previous visit (from the past 24 hours).    Social History   Tobacco Use  Smoking Status Former   Current packs/day: 0.00   Average packs/day: 1 pack/day for 40.6 years (40.6 ttl pk-yrs)   Types: Cigarettes   Start date: 03/05/1981   Quit date: 10/14/2021   Years since quitting: 1.5  Smokeless Tobacco Never    Goals Met:  Proper associated with RPD/PD & O2 Sat Independence with exercise equipment Exercise tolerated well No report of concerns or symptoms today Strength training completed today  Goals Unmet:  Not Applicable  Comments: Service time is from 1005 to 1142.    Dr. Genetta Kenning is Medical Director for Pulmonary Rehab at [facility].

## 2023-05-08 ENCOUNTER — Telehealth: Payer: Self-pay

## 2023-05-08 ENCOUNTER — Telehealth: Payer: Self-pay | Admitting: Internal Medicine

## 2023-05-08 DIAGNOSIS — J4489 Other specified chronic obstructive pulmonary disease: Secondary | ICD-10-CM

## 2023-05-08 NOTE — Telephone Encounter (Signed)
 Received ONO on [facility] which shows desaturation <88% for over 39 minutes despite wearing baseline oxygen . Will order ABG on room air to be done at [facility]. This is the next step in getting bipap for home use at night for COPD.

## 2023-05-08 NOTE — Telephone Encounter (Signed)
 Called pt to let them know of results and next steps per Dr.Desai

## 2023-05-09 ENCOUNTER — Encounter (HOSPITAL_COMMUNITY)
Admission: RE | Admit: 2023-05-09 | Discharge: 2023-05-09 | Disposition: A | Discharge status: Home / Self Care | Source: Ambulatory Visit | Attending: Internal Medicine | Admitting: Internal Medicine

## 2023-05-09 DIAGNOSIS — J449 Chronic obstructive pulmonary disease, unspecified: Secondary | ICD-10-CM | POA: Diagnosis not present

## 2023-05-09 DIAGNOSIS — J439 Emphysema, unspecified: Secondary | ICD-10-CM | POA: Diagnosis not present

## 2023-05-09 DIAGNOSIS — J4489 Other specified chronic obstructive pulmonary disease: Secondary | ICD-10-CM | POA: Diagnosis not present

## 2023-05-09 DIAGNOSIS — R079 Chest pain, unspecified: Secondary | ICD-10-CM | POA: Diagnosis not present

## 2023-05-09 NOTE — Progress Notes (Signed)
 Daily Session Note  Patient Details  Name: Jill Glenn MRN: [id number] Date of Birth: 24-Dec-1965 Referring Provider:   Gattis Kass Pulmonary Rehab Walk Test from 10/26/2022 in [facility] for Heart, Vascular, & Lung Health  Referring Provider Dione Franks       Encounter Date: 05/09/2023  Check In:  Session Check In - 05/09/23 1025       Check-In   Supervising physician immediately available to respond to emergencies [facility] MD immediately available    Physician(s) Slater Duncan, NP    Location [facility]    Staff Present Cindra Cree, Emilio Harder, MS, ACSM-CEP, Exercise Physiologist;Randi Rochelle Chu, ACSM-CEP, Exercise Physiologist;Mary Arlester Ladd, RN, BSN    Virtual Visit No    Medication changes reported     No    Fall or balance concerns reported    No    Tobacco Cessation No Change    Warm-up and Cool-down Performed as group-led instruction    Resistance Training Performed Yes    VAD Patient? No    PAD/SET Patient? No      Pain Assessment   Currently in Pain? No/denies    Pain Score 0-No pain    Multiple Pain Sites No             Capillary Blood Glucose: No results found for this or any previous visit (from the past 24 hours).    Social History   Tobacco Use  Smoking Status Former   Current packs/day: 0.00   Average packs/day: 1 pack/day for 40.6 years (40.6 ttl pk-yrs)   Types: Cigarettes   Start date: 03/05/1981   Quit date: 10/14/2021   Years since quitting: 1.5  Smokeless Tobacco Never    Goals Met:  Proper associated with RPD/PD & O2 Sat Independence with exercise equipment Exercise tolerated well No report of concerns or symptoms today Strength training completed today  Goals Unmet:  Not Applicable  Comments: Service time is from 1006 to 1133.    Dr. Genetta Kenning is Medical Director for Pulmonary Rehab at [facility].

## 2023-05-10 ENCOUNTER — Encounter: Payer: Self-pay | Admitting: Internal Medicine

## 2023-05-11 ENCOUNTER — Ambulatory Visit (INDEPENDENT_AMBULATORY_CARE_PROVIDER_SITE_OTHER): Admitting: Licensed Clinical Social Worker

## 2023-05-11 ENCOUNTER — Encounter (HOSPITAL_COMMUNITY)
Admission: RE | Admit: 2023-05-11 | Discharge: 2023-05-11 | Disposition: A | Discharge status: Home / Self Care | Source: Ambulatory Visit | Attending: Internal Medicine | Admitting: Internal Medicine

## 2023-05-11 DIAGNOSIS — R079 Chest pain, unspecified: Secondary | ICD-10-CM | POA: Diagnosis not present

## 2023-05-11 DIAGNOSIS — F411 Generalized anxiety disorder: Secondary | ICD-10-CM

## 2023-05-11 DIAGNOSIS — J439 Emphysema, unspecified: Secondary | ICD-10-CM | POA: Diagnosis not present

## 2023-05-11 DIAGNOSIS — J4489 Other specified chronic obstructive pulmonary disease: Secondary | ICD-10-CM | POA: Diagnosis not present

## 2023-05-11 DIAGNOSIS — J449 Chronic obstructive pulmonary disease, unspecified: Secondary | ICD-10-CM | POA: Diagnosis not present

## 2023-05-11 DIAGNOSIS — F331 Major depressive disorder, recurrent, moderate: Secondary | ICD-10-CM

## 2023-05-11 NOTE — Progress Notes (Signed)
 THERAPIST PROGRESS NOTE   Session Date: 05/11/2023  Session Time: 1305 - 1352  Participation Level: Active  Behavioral Response: CasualAlertEuthymic  Type of Therapy: Individual Therapy  Treatment Goals addressed:  Anxiety    LTG: "Work on communicating my needs better"        Depression    LTG: Reduce frequency, intensity, and duration of depression symptoms so that daily functioning is improved      LTG: Increase coping skills to manage depression and improve ability to perform daily activities      LTG: "I just want to be happy"        ProgressTowards Goals: Progressing  Interventions: CBT, Motivational Interviewing, and Supportive  Summary: Jill Glenn is a 58 y.o. female with past psych history of MDD, GAD, and PTSD, presenting for follow-up therapy session in efforts to improve management of depressive and anxious symptoms.   Patient actively engaged in session, presenting in pleasant moods and congruent affect throughout duration of visit.  Patient openly engaged in introductory check-in, sharing details surrounding prior canceled and missed appointments over the past 6 weeks, detailing of having needed to complete sleep oxygen  level study, preventing patient from attending last scheduled visit.  Patient further detailed recounts of recent events, sharing of spring break trip to the beach with daughter and grandchildren, sharing of having found enjoyment and spending time with family members.  Patient further shared of things having continued to maintain improvements in moods, while continuing to experience decline in respiratory functioning, with [facility] providers sharing of patient approaching levels that would warrant transplant procedure.  Patient actively engaged in reflection of efforts at managing depressive and anxious symptoms over the past 6 weeks, sharing of having found self redirecting thoughts and focus on the present and proving to enjoy the day to day, avoiding  ruminating on negative thoughts, feelings and past trauma that will prove to bring patient moods down.  Patient responded well to interventions. Patient continues to meet criteria for MDD, GAD, and PTSD. Patient will continue to benefit from engagement in outpatient therapy due to being the least restrictive service to meet presenting needs.      03/29/2023   11:14 AM 03/16/2023    1:14 PM 03/01/2023    1:08 PM 02/15/2023    3:12 PM 01/18/2023    1:19 PM  Depression screen PHQ 2/9  Decreased Interest 0 1 3 1 2   Down, Depressed, Hopeless 1 1 3 1 1   PHQ - 2 Score 1 2 6 2 3   Altered sleeping 0 2 1 3 3   Tired, decreased energy 0 2 3 1 2   Change in appetite 0 2 0 0 0  Feeling bad or failure about yourself  1 1 1 1 3   Trouble concentrating 1 2 0 2 0  Moving slowly or fidgety/restless 0 0 0 0 0  Suicidal thoughts 0 0 0 0 0  PHQ-9 Score 3 11 11 9 11   Difficult doing work/chores Not difficult at all Somewhat difficult Somewhat difficult Somewhat difficult Somewhat difficult      03/29/2023   11:11 AM 03/16/2023    1:12 PM 03/01/2023    1:06 PM 02/15/2023    3:06 PM  GAD 7 : Generalized Anxiety Score  Nervous, Anxious, on Edge 0 1 2 1   Control/stop worrying 0 1 2 3   Worry too much - different things 0 1 2 3   Trouble relaxing 0 1 2 1   Restless 1 3 1 3   Easily annoyed or irritable 0  2 2 1   Afraid - awful might happen 0 0 0 3  Total GAD 7 Score 1 9 11 15   Anxiety Difficulty Not difficult at all Somewhat difficult Somewhat difficult Somewhat difficult     Suicidal/Homicidal: Nowithout intent/plan  Therapist Response: Clinician utilized CBT, MI, and solution focused interventions to address pt's identified presenting problems and depressive and anxious sxs.   Actively greeted patient upon presenting for session, engaging pt in introductory check-in, assessing presenting moods and affect, prompting patient's brief recounts of recent events and factors contributing to presenting moods.   Actively listened to patient's recounts of recent events of the past 6 weeks, utilizing open-ended questions in exploring patient's thoughts, feelings, and experiences surrounding the events of the past 6 weeks.  Utilized Socratic questioning to further elicit greater critical processing of expressed thoughts and feelings surrounding presenting challenges and stressors of the past 6 weeks.  Supported patient in further processing individual effective management of anxiety, providing support and validation of the patient's identified effective measures.  Clinician reassessed severity of depressive and anxious sxs, and presence of any safety concerns. Therapist provided support and empathy to patient during session.  Plan: Return again in 2 week.  Diagnosis:  Encounter Diagnoses  Name Primary?   Generalized anxiety disorder Yes   MDD (major depressive disorder), recurrent episode, moderate (HCC)    Collaboration of Care: Psychiatrist AEB provider notes available in EHR.  Patient/Guardian was advised Release of Information must be obtained prior to any record release in order to collaborate their care with an outside provider. Patient/Guardian was advised if they have not already done so to contact the registration department to sign all necessary forms in order for us  to release information regarding their care.   Consent: Patient/Guardian gives verbal consent for treatment and assignment of benefits for services provided during this visit. Patient/Guardian expressed understanding and agreed to proceed.   Patsi Boots, MSW, LCSW 05/11/2023,  1:08 PM

## 2023-05-11 NOTE — Progress Notes (Signed)
 Daily Session Note  Patient Details  Name: Jill Glenn MRN: [id number] Date of Birth: 1965/12/29 Referring Provider:   Gattis Kass Pulmonary Rehab Walk Test from 10/26/2022 in [facility]  Referring Provider Dione Franks       Encounter Date: 05/11/2023  Check In:  Session Check In - 05/11/23 1106       Check-In   Supervising physician immediately available to respond to emergencies [facility] MD immediately available    Physician(s) Levin Reamer, NP    Location [facility]    Staff Present Cindra Cree, Emilio Harder, MS, ACSM-CEP, Exercise Physiologist;Randi Rochelle Chu, ACSM-CEP, Exercise Physiologist;Mary Arlester Ladd, RN, Malvin Searing, MS, ACSM-CEP, CCRP, Exercise Physiologist    Virtual Visit No    Medication changes reported     No    Fall or balance concerns reported    No    Tobacco Cessation No Change    Warm-up and Cool-down Performed as group-led instruction    Resistance Training Performed Yes    VAD Patient? No    PAD/SET Patient? No      Pain Assessment   Currently in Pain? No/denies    Multiple Pain Sites No             Capillary Blood Glucose: No results found for this or any previous visit (from the past 24 hours).    Social History   Tobacco Use  Smoking Status Former   Current packs/day: 0.00   Average packs/day: 1 pack/day for 40.6 years (40.6 ttl pk-yrs)   Types: Cigarettes   Start date: 03/05/1981   Quit date: 10/14/2021   Years since quitting: 1.5  Smokeless Tobacco Never    Goals Met:  Proper associated with RPD/PD & O2 Sat Independence with exercise equipment Exercise tolerated well No report of concerns or symptoms today Strength training completed today  Goals Unmet:  Not Applicable  Comments: Service time is from 1005 to 1136.    Dr. Genetta Kenning is Medical Director for Pulmonary Rehab at [facility].

## 2023-05-16 ENCOUNTER — Ambulatory Visit: Payer: Self-pay | Admitting: Internal Medicine

## 2023-05-16 ENCOUNTER — Other Ambulatory Visit: Payer: Self-pay | Admitting: Internal Medicine

## 2023-05-16 ENCOUNTER — Ambulatory Visit (INDEPENDENT_AMBULATORY_CARE_PROVIDER_SITE_OTHER)

## 2023-05-16 ENCOUNTER — Ambulatory Visit (INDEPENDENT_AMBULATORY_CARE_PROVIDER_SITE_OTHER): Payer: 59 | Admitting: Internal Medicine

## 2023-05-16 ENCOUNTER — Encounter (HOSPITAL_COMMUNITY)
Admission: RE | Admit: 2023-05-16 | Discharge: 2023-05-16 | Disposition: A | Discharge status: Home / Self Care | Source: Ambulatory Visit | Attending: Internal Medicine | Admitting: Internal Medicine

## 2023-05-16 VITALS — BP 106/68 | HR 62 | Temp 97.9°F | Resp 20 | Ht 63.0 in | Wt 172.4 lb

## 2023-05-16 VITALS — Wt 173.1 lb

## 2023-05-16 DIAGNOSIS — J449 Chronic obstructive pulmonary disease, unspecified: Secondary | ICD-10-CM | POA: Diagnosis not present

## 2023-05-16 DIAGNOSIS — R072 Precordial pain: Secondary | ICD-10-CM | POA: Insufficient documentation

## 2023-05-16 DIAGNOSIS — E785 Hyperlipidemia, unspecified: Secondary | ICD-10-CM | POA: Diagnosis not present

## 2023-05-16 DIAGNOSIS — J4489 Other specified chronic obstructive pulmonary disease: Secondary | ICD-10-CM | POA: Diagnosis not present

## 2023-05-16 DIAGNOSIS — R052 Subacute cough: Secondary | ICD-10-CM

## 2023-05-16 DIAGNOSIS — E2839 Other primary ovarian failure: Secondary | ICD-10-CM | POA: Diagnosis not present

## 2023-05-16 DIAGNOSIS — J301 Allergic rhinitis due to pollen: Secondary | ICD-10-CM

## 2023-05-16 DIAGNOSIS — J439 Emphysema, unspecified: Secondary | ICD-10-CM | POA: Diagnosis not present

## 2023-05-16 DIAGNOSIS — R9431 Abnormal electrocardiogram [ECG] [EKG]: Secondary | ICD-10-CM | POA: Diagnosis not present

## 2023-05-16 DIAGNOSIS — K2100 Gastro-esophageal reflux disease with esophagitis, without bleeding: Secondary | ICD-10-CM | POA: Diagnosis not present

## 2023-05-16 DIAGNOSIS — Z0001 Encounter for general adult medical examination with abnormal findings: Secondary | ICD-10-CM

## 2023-05-16 DIAGNOSIS — R0902 Hypoxemia: Secondary | ICD-10-CM | POA: Diagnosis not present

## 2023-05-16 DIAGNOSIS — Z0000 Encounter for general adult medical examination without abnormal findings: Secondary | ICD-10-CM

## 2023-05-16 DIAGNOSIS — R079 Chest pain, unspecified: Secondary | ICD-10-CM | POA: Diagnosis not present

## 2023-05-16 DIAGNOSIS — R059 Cough, unspecified: Secondary | ICD-10-CM | POA: Diagnosis not present

## 2023-05-16 LAB — HEPATIC FUNCTION PANEL
ALT: 15 U/L (ref 0–35)
AST: 16 U/L (ref 0–37)
Albumin: 4.7 g/dL (ref 3.5–5.2)
Alkaline Phosphatase: 60 U/L (ref 39–117)
Bilirubin, Direct: 0.1 mg/dL (ref 0.0–0.3)
Total Bilirubin: 0.5 mg/dL (ref 0.2–1.2)
Total Protein: 7.5 g/dL (ref 6.0–8.3)

## 2023-05-16 LAB — BASIC METABOLIC PANEL WITH GFR
BUN: 17 mg/dL (ref 6–23)
CO2: 28 meq/L (ref 19–32)
Calcium: 9.7 mg/dL (ref 8.4–10.5)
Chloride: 101 meq/L (ref 96–112)
Creatinine, Ser: 0.84 mg/dL (ref 0.40–1.20)
GFR: 77.26 mL/min (ref >60.00)
Glucose, Bld: 94 mg/dL (ref 70–99)
Potassium: 3.8 meq/L (ref 3.5–5.1)
Sodium: 138 meq/L (ref 135–145)

## 2023-05-16 LAB — CBC WITH DIFFERENTIAL/PLATELET
Basophils Absolute: 0.1 10*3/uL (ref 0.0–0.1)
Basophils Relative: 1.3 % (ref 0.0–3.0)
Eosinophils Absolute: 0.5 10*3/uL (ref 0.0–0.7)
Eosinophils Relative: 5.1 % — ABNORMAL HIGH (ref 0.0–5.0)
HCT: 41.1 % (ref 36.0–46.0)
Hemoglobin: 13.5 g/dL (ref 12.0–15.0)
Lymphocytes Relative: 24 % (ref 12.0–46.0)
Lymphs Abs: 2.2 10*3/uL (ref 0.7–4.0)
MCHC: 32.9 g/dL (ref 30.0–36.0)
MCV: 86.4 fl (ref 78.0–100.0)
Monocytes Absolute: 0.6 10*3/uL (ref 0.1–1.0)
Monocytes Relative: 6.4 % (ref 3.0–12.0)
Neutro Abs: 5.9 10*3/uL (ref 1.4–7.7)
Neutrophils Relative %: 63.2 % (ref 43.0–77.0)
Platelets: 379 10*3/uL (ref 150.0–400.0)
RBC: 4.75 Mil/uL (ref 3.87–5.11)
RDW: 14.6 % (ref 11.5–15.5)
WBC: 9.3 10*3/uL (ref 4.0–10.5)

## 2023-05-16 LAB — LIPID PANEL
Cholesterol: 222 mg/dL — ABNORMAL HIGH (ref 0–200)
HDL: 57.6 mg/dL (ref >39.00)
LDL Cholesterol: 139 mg/dL — ABNORMAL HIGH (ref 0–99)
NonHDL: 164.84
Total CHOL/HDL Ratio: 4
Triglycerides: 130 mg/dL (ref 0.0–149.0)
VLDL: 26 mg/dL (ref 0.0–40.0)

## 2023-05-16 LAB — TSH: TSH: 1.06 u[IU]/mL (ref 0.35–5.50)

## 2023-05-16 LAB — TROPONIN I (HIGH SENSITIVITY): High Sens Troponin I: 3 ng/L (ref 2–17)

## 2023-05-16 LAB — BRAIN NATRIURETIC PEPTIDE: Pro B Natriuretic peptide (BNP): 16 pg/mL (ref 0.0–100.0)

## 2023-05-16 NOTE — Patient Instructions (Signed)

## 2023-05-16 NOTE — Progress Notes (Signed)
 Daily Session Note  Patient Details  Name: Jill Glenn MRN: [id number] Date of Birth: 1965/11/23 Referring Provider:   Gattis Kass Pulmonary Rehab Walk Test from 10/26/2022 in [facility]  Referring Provider Dione Franks       Encounter Date: 05/16/2023  Check In:  Session Check In - 05/16/23 1019       Check-In   Supervising physician immediately available to respond to emergencies [facility] MD immediately available    Physician(s) Levin Reamer, NP    [facility]    Staff Present Cindra Cree, Emilio Harder, MS, ACSM-CEP, Exercise Physiologist;Randi Rochelle Chu, ACSM-CEP, Exercise Physiologist;Labron Bloodgood Arlester Ladd, RN, BSN;Samantha Belarus, RD, LDN    Virtual Visit No    Medication changes reported     No    Tobacco Cessation No Change    Warm-up and Cool-down Performed as group-led instruction    Resistance Training Performed Yes    VAD Patient? No    PAD/SET Patient? No      Pain Assessment   Currently in Pain? No/denies             Capillary Blood Glucose: Results for orders placed or performed in visit on 05/16/23 (from the past 24 hours)  Lipid panel     Status: Abnormal   Collection Time: 05/16/23  8:54 AM  Result Value Ref Range   Cholesterol 222 (H) 0 - 200 mg/dL   Triglycerides 102.7 0.0 - 149.0 mg/dL   HDL 25.36 >64.40 mg/dL   VLDL 34.7 0.0 - 42.5 mg/dL   LDL Cholesterol 956 (H) 0 - 99 mg/dL   Total CHOL/HDL Ratio 4    NonHDL 164.84   Basic metabolic panel with GFR     Status: None   Collection Time: 05/16/23  8:54 AM  Result Value Ref Range   Sodium 138 135 - 145 mEq/L   Potassium 3.8 3.5 - 5.1 mEq/L   Chloride 101 96 - 112 mEq/L   CO2 28 19 - 32 mEq/L   Glucose, Bld 94 70 - 99 mg/dL   BUN 17 6 - 23 mg/dL   Creatinine, Ser 3.87 0.40 - 1.20 mg/dL   GFR 56.43 >32.95 mL/min   Calcium 9.7 8.4 - 10.5 mg/dL  CBC with Differential/Platelet     Status: Abnormal   Collection  Time: 05/16/23  8:54 AM  Result Value Ref Range   WBC 9.3 4.0 - 10.5 K/uL   RBC 4.75 3.87 - 5.11 Mil/uL   Hemoglobin 13.5 12.0 - 15.0 g/dL   HCT 18.8 41.6 - 60.6 %   MCV 86.4 78.0 - 100.0 fl   MCHC 32.9 30.0 - 36.0 g/dL   RDW 30.1 60.1 - 09.3 %   Platelets 379.0 150.0 - 400.0 K/uL   Neutrophils Relative % 63.2 43.0 - 77.0 %   Lymphocytes Relative 24.0 12.0 - 46.0 %   Monocytes Relative 6.4 3.0 - 12.0 %   Eosinophils Relative 5.1 (H) 0.0 - 5.0 %   Basophils Relative 1.3 0.0 - 3.0 %   Neutro Abs 5.9 1.4 - 7.7 K/uL   Lymphs Abs 2.2 0.7 - 4.0 K/uL   Monocytes Absolute 0.6 0.1 - 1.0 K/uL   Eosinophils Absolute 0.5 0.0 - 0.7 K/uL   Basophils Absolute 0.1 0.0 - 0.1 K/uL  TSH     Status: None   Collection Time: 05/16/23  8:54 AM  Result Value Ref Range   TSH 1.06 0.35 - 5.50  uIU/mL  Hepatic function panel     Status: None   Collection Time: 05/16/23  8:54 AM  Result Value Ref Range   Total Bilirubin 0.5 0.2 - 1.2 mg/dL   Bilirubin, Direct 0.1 0.0 - 0.3 mg/dL   Alkaline Phosphatase 60 39 - 117 U/L   AST 16 0 - 37 U/L   ALT 15 0 - 35 U/L   Total Protein 7.5 6.0 - 8.3 g/dL   Albumin 4.7 3.5 - 5.2 g/dL  Brain natriuretic peptide     Status: None   Collection Time: 05/16/23  8:54 AM  Result Value Ref Range   Pro B Natriuretic peptide (BNP) 16.0 0.0 - 100.0 pg/mL  Troponin I (High Sensitivity)     Status: None   Collection Time: 05/16/23  8:54 AM  Result Value Ref Range   High Sens Troponin I 3 2 - 17 ng/L     Exercise Prescription Changes - 05/16/23 1100       Response to Exercise   Blood Pressure (Admit) 94/64    Blood Pressure (Exercise) 106/72    Blood Pressure (Exit) 104/60    Heart Rate (Admit) 68 bpm    Heart Rate (Exercise) 108 bpm    Heart Rate (Exit) 78 bpm    Oxygen  Saturation (Admit) 99 %   2L   Oxygen  Saturation (Exercise) 93 %   2L   Oxygen  Saturation (Exit) 96 %   2L   Rating of Perceived Exertion (Exercise) 13    Perceived Dyspnea (Exercise) 2    Duration  Continue with 30 min of aerobic exercise without signs/symptoms of physical distress.    Intensity THRR unchanged      Progression   Progression Continue to progress workloads to maintain intensity without signs/symptoms of physical distress.      Resistance Training   Training Prescription Yes    Weight blue bands    Reps 10-15    Time 10 Minutes      Interval Training   Interval Training Yes    Equipment Treadmill    Comments 41min@2 .62mph&3%incline METS 3.6, 37min@1 .50mph&0%incline METS 2.2      Oxygen    Oxygen  Continuous    Liters 2      Recumbant Elliptical   Level 5    Minutes 15    METs 4.2      Oxygen    Maintain Oxygen  Saturation 88% or higher             Social History   Tobacco Use  Smoking Status Former   Current packs/day: 0.00   Average packs/day: 1 pack/day for 40.6 years (40.6 ttl pk-yrs)   Types: Cigarettes   Start date: 03/05/1981   Quit date: 10/14/2021   Years since quitting: 1.5  Smokeless Tobacco Never    Goals Met:  Independence with exercise equipment Exercise tolerated well No report of concerns or symptoms today Strength training completed today  Goals Unmet:  Not Applicable  Comments: Service time is from 1004 to 1125    Dr. Genetta Kenning is Medical Director for Pulmonary Rehab at [facility].

## 2023-05-16 NOTE — Progress Notes (Unsigned)
 Subjective:  Patient ID: Jill Glenn, female    DOB: 07/28/1965  Age: 58 y.o. MRN: [id number]  CC: Medical Management of Chronic Issues (6 month follow up. /), CAT Scan  (Discuss the results), Bone Density Order (Place order for patient), and Annual Exam   HPI Jill Glenn presents for a CPX and f/up ----  Discussed the use of AI scribe software for clinical note transcription with the patient, who gave verbal consent to proceed.  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  She experiences intermittent chest pain, sometimes located over the left heart and sometimes underneath, at the bottom of the chest. The pain occurs both at rest and during activity, described as pressure, stabbing, and tightness that 'takes my breath'. The pain does not consistently worsen with deep breaths.  She has a history of COPD and emphysema, which complicates her ability to discern whether her symptoms are related to her lungs or heart. She is on oxygen  therapy nearly continuously and has noticed an increase in coughing, particularly in the mornings, though it is not consistent. She is currently taking acid reducers.  She feels weak, dizzy, or lightheaded at times, especially when bending over or stepping off exercise equipment. No fever, chills, night sweats, or weight loss. She has not taken cholesterol medication in the past. She is on a high protein diet and has lost about ten pounds recently.  There is a family history of heart disease on her mother's side, with her grandmother and grandfather having had heart disease and strokes.       Outpatient Medications Prior to Visit  Medication Sig Dispense Refill   acetaminophen  (TYLENOL ) 325 MG tablet Take 2 tablets (650 mg total) by mouth every 6 (six) hours as needed for moderate pain, mild pain or headache.     albuterol   (PROVENTIL ) (2.5 MG/3ML) 0.083% nebulizer solution Take 3 mLs (2.5 mg total) by nebulization every 6 (six) hours as needed for wheezing or shortness of breath. 75 mL 12   albuterol  (VENTOLIN  HFA) 108 (90 Base) MCG/ACT inhaler Inhale 2 puffs into the lungs every 6 (six) hours as needed for wheezing or shortness of breath. 18 g 3   Ascorbic Acid (VITAMIN C GUMMIES PO) Take 500 mg by mouth daily.     busPIRone  (BUSPAR ) 15 MG tablet Take 1 tablet (15 mg total) by mouth 2 (two) times daily. 180 tablet 0   citalopram  (CELEXA ) 10 MG tablet Take 1 tablet (10 mg total) by mouth daily. 90 tablet 0   hydrOXYzine  (ATARAX ) 25 MG tablet Take 1 tablet (25 mg total) by mouth at bedtime as needed (sleep). 90 tablet 0   levocetirizine (XYZAL ) 5 MG tablet Take 1 tablet (5 mg total) by mouth every evening. 30 tablet 11   montelukast  (SINGULAIR ) 10 MG tablet Take 1 tablet (10 mg total) by mouth at bedtime. 30 tablet 11   Multiple Vitamin (MULTIVITAMIN ADULT) TABS Take 1 tablet by mouth daily with breakfast.     rizatriptan  (MAXALT ) 10 MG tablet Take 1 tablet (10 mg total) by mouth once as needed for migraine. May repeat in 2 hours if needed 30 tablet 2   TRELEGY ELLIPTA  200-62.5-25 MCG/ACT AEPB Inhale 1 Inhalation into the lungs daily. 1 each 5   Ubrogepant  (UBRELVY ) 100 MG TABS Take one tablet onset of Migraine May take another tablet 2  hours later, Don't exceed two tablets daily 16 tablet 11   varenicline  (CHANTIX ) 1 MG tablet Take 1 mg by mouth daily.     No facility-administered medications prior to visit.    ROS Review of Systems  Objective:  BP 106/68 (BP Location: Right Arm, Patient Position: Sitting, Cuff Size: Normal)   Pulse 62   Temp 97.9 F (36.6 C) (Oral)   Resp 20   Ht 5\' 3"  (1.6 m)   Wt 172 lb 6.4 oz (78.2 kg)   SpO2 96%   BMI 30.54 kg/m   BP Readings from Last 3 Encounters:  05/16/23 106/68  04/11/23 126/82  02/14/23 120/80    Wt Readings from Last 3 Encounters:  05/16/23 172 lb 6.4  oz (78.2 kg)  05/02/23 174 lb 9.7 oz (79.2 kg)  04/18/23 176 lb 12.9 oz (80.2 kg)    Physical Exam Cardiovascular:     Rate and Rhythm: Normal rate and regular rhythm.     Heart sounds: Heart sounds are distant. No murmur heard.    No gallop.     Comments: EKG-- NSR, 60 bpm TWI in anterior leads No Q waves Unchanged Pulmonary:     Effort: Tachypnea and accessory muscle usage present. No respiratory distress.     Breath sounds: Examination of the right-lower field reveals rales. Rales present. No decreased breath sounds, wheezing or rhonchi.  Musculoskeletal:     Right lower leg: No edema.     Left lower leg: No edema.     Lab Results  Component Value Date   WBC 9.3 05/16/2023   HGB 13.5 05/16/2023   HCT 41.1 05/16/2023   PLT 379.0 05/16/2023   GLUCOSE 94 05/16/2023   CHOL 222 (H) 05/16/2023   TRIG 130.0 05/16/2023   HDL 57.60 05/16/2023   LDLCALC 139 (H) 05/16/2023   ALT 15 05/16/2023   AST 16 05/16/2023   NA 138 05/16/2023   K 3.8 05/16/2023   CL 101 05/16/2023   CREATININE 0.84 05/16/2023   BUN 17 05/16/2023   CO2 28 05/16/2023   TSH 1.06 05/16/2023   INR 1.0 03/23/2022   HGBA1C 5.9 11/16/2022    DG Chest 2 View Result Date: 05/16/2023 CLINICAL DATA:  Worsening cough for 2 weeks. Chronic shortness of breath. Chest pain. Hypoxia. EXAM: CHEST - 2 VIEW COMPARISON:  11/08/2022 FINDINGS: The heart size and mediastinal contours are within normal limits. Moderate emphysema again noted. Both lungs are clear. The visualized skeletal structures are unremarkable. IMPRESSION: Moderate emphysema. No active lung disease. Electronically Signed   By: Marlyce Sine M.D.   On: 05/16/2023 09:24   CT CHEST LCS NODULE F/U LOW DOSE WO CONTRAST Result Date: 05/09/2023 CLINICAL DATA:  Lung nodule. Follow-up. Former smoker with 60 pack-year smoking history. EXAM: CT CHEST WITHOUT CONTRAST FOR LUNG CANCER SCREENING NODULE FOLLOW-UP TECHNIQUE: Multidetector CT imaging of the chest was  performed following the standard protocol without IV contrast. RADIATION DOSE REDUCTION: This exam was performed according to the departmental dose-optimization program which includes automated exposure control, adjustment of the mA and/or kV according to patient size and/or use of iterative reconstruction technique. COMPARISON:  09/29/2022 FINDINGS: Cardiovascular: Normal heart size. No pericardial effusion. Aortic atherosclerosis. Mediastinum/Nodes: No enlarged mediastinal, hilar, or axillary lymph nodes. Thyroid  gland, trachea, and esophagus demonstrate no significant findings. Small hiatal hernia noted. Lungs/Pleura: No pleural effusion, airspace consolidation, atelectasis or pneumothorax. Emphysema with diffuse bronchial wall thickening. Scattered calcified granulomas. The previously noted lung nodules are stable in the interval.  This includes the previously noted subpleural nodular density in the left upper lobe measuring 11.6 mm. There is a new nodule within the central left upper lobe which has a mean derived diameter of 4 mm. Within the anterior basal right upper lobe there is a new focal area of interstitial thickening and architectural distortion measuring 1.5 cm, image 136/4. Which is likely postinflammatory or infectious in etiology, image 136/4. Upper Abdomen: No acute abnormality. Musculoskeletal: No chest wall mass or suspicious bone lesions identified. IMPRESSION: 1. Lung-RADS 3, probably benign findings. Short-term follow-up in 6 months is recommended with repeat low-dose chest CT without contrast (please use the following order, "CT CHEST LCS NODULE FOLLOW-UP W/O CM"). 2. Small hiatal hernia. 3. Aortic Atherosclerosis (ICD10-I70.0) and Emphysema (ICD10-J43.9). Electronically Signed   By: Kimberley Penman M.D.   On: 05/09/2023 08:23     Assessment & Plan:  Dyslipidemia, goal LDL below 130 -     Lipid panel; Future -     Basic metabolic panel with GFR; Future -     TSH; Future -     Hepatic  function panel; Future  Gastroesophageal reflux disease with esophagitis without hemorrhage -     Basic metabolic panel with GFR; Future -     CBC with Differential/Platelet; Future -     Hepatic function panel; Future  Encounter for general adult medical examination with abnormal findings  Precordial chest pain -     EKG 12-Lead -     Brain natriuretic peptide; Future -     Troponin I (High Sensitivity); Future -     DG Chest 2 View; Future -     CT CORONARY MORPH W/CTA COR W/SCORE W/CA W/CM &/OR WO/CM; Future  Abnormal electrocardiogram (ECG) (EKG) -     Brain natriuretic peptide; Future -     Troponin I (High Sensitivity); Future -     CT CORONARY MORPH W/CTA COR W/SCORE W/CA W/CM &/OR WO/CM; Future  Subacute cough -     DG Chest 2 View; Future     Follow-up: Return in about 6 months (around 11/16/2023).  Sandra Crouch, MD

## 2023-05-17 NOTE — Progress Notes (Signed)
 Pulmonary Individual Treatment Plan  Patient Details  Name: Jill Glenn MRN: [id number] Date of Birth: Mar 27, 1965 Referring Provider:   Gattis Kass Pulmonary Rehab Walk Test from 10/26/2022 in [facility]  Referring Provider Dione Franks       Initial Encounter Date:  Flowsheet Row Pulmonary Rehab Walk Test from 10/26/2022 in [facility] for Heart, Vascular, [facility] Lung [facility]  Date 10/26/22       Visit Diagnosis: Stage 3 severe COPD by GOLD classification (HCC)  Patient's Home Medications on Admission:   Current Outpatient Medications:    acetaminophen  (TYLENOL ) 325 MG tablet, Take 2 tablets (650 mg total) by mouth every 6 (six) hours as needed for moderate pain, mild pain or headache., Disp: , Rfl:    albuterol  (PROVENTIL ) (2.5 MG/3ML) 0.083% nebulizer solution, Take 3 mLs (2.5 mg total) by nebulization every 6 (six) hours as needed for wheezing or shortness of breath., Disp: 75 mL, Rfl: 12   albuterol  (VENTOLIN  HFA) 108 (90 Base) MCG/ACT inhaler, Inhale 2 puffs into the lungs every 6 (six) hours as needed for wheezing or shortness of breath., Disp: 18 g, Rfl: 3   Ascorbic Acid (VITAMIN C GUMMIES PO), Take 500 mg by mouth daily., Disp: , Rfl:    busPIRone  (BUSPAR ) 15 MG tablet, Take 1 tablet (15 mg total) by mouth 2 (two) times daily., Disp: 180 tablet, Rfl: 0   citalopram  (CELEXA ) 10 MG tablet, Take 1 tablet (10 mg total) by mouth daily., Disp: 90 tablet, Rfl: 0   hydrOXYzine  (ATARAX ) 25 MG tablet, Take 1 tablet (25 mg total) by mouth at bedtime as needed (sleep)., Disp: 90 tablet, Rfl: 0   levocetirizine (XYZAL ) 5 MG tablet, Take 1 tablet (5 mg total) by mouth every evening., Disp: 30 tablet, Rfl: 11   montelukast  (SINGULAIR ) 10 MG tablet, TAKE 1 TABLET BY MOUTH AT BEDTIME, Disp: 30 tablet, Rfl: 0   Multiple Vitamin (MULTIVITAMIN ADULT) TABS, Take 1 tablet by mouth daily with breakfast., Disp: , Rfl:     rizatriptan  (MAXALT ) 10 MG tablet, Take 1 tablet (10 mg total) by mouth once as needed for migraine. May repeat in 2 hours if needed, Disp: 30 tablet, Rfl: 2   TRELEGY ELLIPTA  200-62.5-25 MCG/ACT AEPB, Inhale 1 Inhalation into the lungs daily., Disp: 1 each, Rfl: 5   Ubrogepant  (UBRELVY ) 100 MG TABS, Take one tablet onset of Migraine May take another tablet 2 hours later, Don't exceed two tablets daily, Disp: 16 tablet, Rfl: 11   varenicline  (CHANTIX ) 1 MG tablet, Take 1 mg by mouth daily., Disp: , Rfl:   Past Medical History: Past Medical History:  Diagnosis Date   Allergy     seasonal   Anxiety    Asthma    COPD (chronic obstructive pulmonary disease) (HCC)    Depression    Emphysema of lung (HCC)    PTSD (post-traumatic stress disorder)     Tobacco Use: Social History   Tobacco Use  Smoking Status Former   Current packs/day: 0.00   Average packs/day: 1 pack/day for 40.6 years (40.6 ttl pk-yrs)   Types: Cigarettes   Start date: 03/05/1981   Quit date: 10/14/2021   Years since quitting: 1.5  Smokeless Tobacco Never    Labs: Review Flowsheet  More data exists      Latest Ref Rng & Units 12/21/2020 03/24/2022 04/12/2022 11/16/2022 05/16/2023  Labs for ITP Cardiac and Pulmonary Rehab  Cholestrol 0 - 200 mg/dL  177  - 259  - 222   LDL (calc) 0 - 99 mg/dL 91  - 295  - 621   HDL-C >39.00 mg/dL 30.86  - 57.84  - 69.62   Trlycerides 0.0 - 149.0 mg/dL 95.2  - 841.3  - 244.0   Hemoglobin A1c 4.6 - 6.5 % - 6.0  - 5.9  -    Capillary Blood Glucose: Lab Results  Component Value Date   GLUCAP 73 03/26/2022   GLUCAP 148 (H) 03/25/2022   GLUCAP 166 (H) 03/25/2022   GLUCAP 88 03/25/2022   GLUCAP 84 03/25/2022     Pulmonary Assessment Scores:  UCSD: Self-administered rating of dyspnea associated with activities of daily living (ADLs) 6-point scale (0 = "not at all" to 5 = "maximal or unable to do because of breathlessness")  Scoring Scores range from 0 to 120.  Minimally  important difference is 5 units  CAT: CAT can identify the health impairment of COPD patients and is better correlated with disease progression.  CAT has a scoring range of zero to 40. The CAT score is classified into four groups of low (less than 10), medium (10 - 20), high (21-30) and very high (31-40) based on the impact level of disease on health status. A CAT score over 10 suggests significant symptoms.  A worsening CAT score could be explained by an exacerbation, poor medication adherence, poor inhaler technique, or progression of COPD or comorbid conditions.  CAT MCID is 2 points  mMRC: mMRC (Modified Medical Research Council) Dyspnea Scale is used to assess the degree of baseline functional disability in patients of respiratory disease due to dyspnea. No minimal important difference is established. A decrease in score of 1 point or greater is considered a positive change.   Pulmonary Function Assessment:   Exercise Target Goals: Exercise Program Goal: Individual exercise prescription set using results from initial 6 min walk test and THRR while considering  patient's activity barriers and safety.   Exercise Prescription Goal: Initial exercise prescription builds to 30-45 minutes a day of aerobic activity, 2-3 days per week.  Home exercise guidelines will be given to patient during program as part of exercise prescription that the participant will acknowledge.  Activity Barriers & Risk Stratification:   6 Minute Walk:   Oxygen  Initial Assessment:   Oxygen  Re-Evaluation:  Oxygen  Re-Evaluation     Row Name 11/23/22 1202 12/19/22 0909 01/16/23 0934 02/13/23 1206 03/20/23 0906     Program Oxygen  Prescription   Program Oxygen  Prescription Continuous Continuous Continuous Continuous Continuous   Liters per minute 2 2 2 2 2      Home Oxygen    Home Oxygen  Device Home Concentrator;Portable Concentrator;E-Tanks Home Concentrator;Portable Concentrator;E-Tanks Home  Concentrator;Portable Concentrator;E-Tanks Home Concentrator;Portable Concentrator;E-Tanks Home Concentrator;Portable Concentrator;E-Tanks   Sleep Oxygen  Prescription Continuous Continuous Continuous Continuous Continuous   Liters per minute 2 2 2 2 2    Home Exercise Oxygen  Prescription Pulsed Pulsed Pulsed Pulsed Pulsed   Liters per minute 2 2 2 2 2    Home Resting Oxygen  Prescription None None None None None   Compliance with Home Oxygen  Use Yes Yes Yes Yes Yes     Goals/Expected Outcomes   Short Term Goals To learn and exhibit compliance with exercise, home and travel O2 prescription;To learn and understand importance of monitoring SPO2 with pulse oximeter and demonstrate accurate use of the pulse oximeter.;To learn and understand importance of maintaining oxygen  saturations>88%;To learn and demonstrate proper pursed lip breathing techniques or other breathing techniques. ;To learn and  demonstrate proper use of respiratory medications To learn and exhibit compliance with exercise, home and travel O2 prescription;To learn and understand importance of monitoring SPO2 with pulse oximeter and demonstrate accurate use of the pulse oximeter.;To learn and understand importance of maintaining oxygen  saturations>88%;To learn and demonstrate proper pursed lip breathing techniques or other breathing techniques. ;To learn and demonstrate proper use of respiratory medications To learn and exhibit compliance with exercise, home and travel O2 prescription;To learn and understand importance of monitoring SPO2 with pulse oximeter and demonstrate accurate use of the pulse oximeter.;To learn and understand importance of maintaining oxygen  saturations>88%;To learn and demonstrate proper pursed lip breathing techniques or other breathing techniques. ;To learn and demonstrate proper use of respiratory medications To learn and exhibit compliance with exercise, home and travel O2 prescription;To learn and understand importance  of monitoring SPO2 with pulse oximeter and demonstrate accurate use of the pulse oximeter.;To learn and understand importance of maintaining oxygen  saturations>88%;To learn and demonstrate proper pursed lip breathing techniques or other breathing techniques. ;To learn and demonstrate proper use of respiratory medications To learn and exhibit compliance with exercise, home and travel O2 prescription;To learn and understand importance of monitoring SPO2 with pulse oximeter and demonstrate accurate use of the pulse oximeter.;To learn and understand importance of maintaining oxygen  saturations>88%;To learn and demonstrate proper pursed lip breathing techniques or other breathing techniques. ;To learn and demonstrate proper use of respiratory medications   Long  Term Goals Exhibits compliance with exercise, home  and travel O2 prescription;Maintenance of O2 saturations>88%;Compliance with respiratory medication;Verbalizes importance of monitoring SPO2 with pulse oximeter and return demonstration;Exhibits proper breathing techniques, such as pursed lip breathing or other method taught during program session;Demonstrates proper use of MDI's Exhibits compliance with exercise, home  and travel O2 prescription;Maintenance of O2 saturations>88%;Compliance with respiratory medication;Verbalizes importance of monitoring SPO2 with pulse oximeter and return demonstration;Exhibits proper breathing techniques, such as pursed lip breathing or other method taught during program session;Demonstrates proper use of MDI's Exhibits compliance with exercise, home  and travel O2 prescription;Maintenance of O2 saturations>88%;Compliance with respiratory medication;Verbalizes importance of monitoring SPO2 with pulse oximeter and return demonstration;Exhibits proper breathing techniques, such as pursed lip breathing or other method taught during program session;Demonstrates proper use of MDI's Exhibits compliance with exercise, home  and  travel O2 prescription;Maintenance of O2 saturations>88%;Compliance with respiratory medication;Verbalizes importance of monitoring SPO2 with pulse oximeter and return demonstration;Exhibits proper breathing techniques, such as pursed lip breathing or other method taught during program session;Demonstrates proper use of MDI's Exhibits compliance with exercise, home  and travel O2 prescription;Maintenance of O2 saturations>88%;Compliance with respiratory medication;Verbalizes importance of monitoring SPO2 with pulse oximeter and return demonstration;Exhibits proper breathing techniques, such as pursed lip breathing or other method taught during program session;Demonstrates proper use of MDI's   Goals/Expected Outcomes Compliance and understanding of oxygen  saturations monitoring and breathing techniques to decrease shortness of breath. Compliance and understanding of oxygen  saturations monitoring and breathing techniques to decrease shortness of breath. Compliance and understanding of oxygen  saturations monitoring and breathing techniques to decrease shortness of breath. Compliance and understanding of oxygen  saturations monitoring and breathing techniques to decrease shortness of breath. Compliance and understanding of oxygen  saturations monitoring and breathing techniques to decrease shortness of breath.    Row Name 04/12/23 1049 05/12/23 0942           Program Oxygen  Prescription   Program Oxygen  Prescription Continuous Continuous      Liters per minute 2 2  Home Oxygen    Home Oxygen  Device Home Concentrator;Portable Concentrator;E-Tanks Home Concentrator;Portable Concentrator;E-Tanks      Sleep Oxygen  Prescription Continuous Continuous      Liters per minute 2 2      Home Exercise Oxygen  Prescription Pulsed Pulsed      Liters per minute 2 2      Home Resting Oxygen  Prescription None None      Compliance with Home Oxygen  Use Yes Yes        Goals/Expected Outcomes   Short Term Goals To  learn and exhibit compliance with exercise, home and travel O2 prescription;To learn and understand importance of monitoring SPO2 with pulse oximeter and demonstrate accurate use of the pulse oximeter.;To learn and understand importance of maintaining oxygen  saturations>88%;To learn and demonstrate proper pursed lip breathing techniques or other breathing techniques. ;To learn and demonstrate proper use of respiratory medications To learn and exhibit compliance with exercise, home and travel O2 prescription;To learn and understand importance of monitoring SPO2 with pulse oximeter and demonstrate accurate use of the pulse oximeter.;To learn and understand importance of maintaining oxygen  saturations>88%;To learn and demonstrate proper pursed lip breathing techniques or other breathing techniques. ;To learn and demonstrate proper use of respiratory medications      Long  Term Goals Exhibits compliance with exercise, home  and travel O2 prescription;Maintenance of O2 saturations>88%;Compliance with respiratory medication;Verbalizes importance of monitoring SPO2 with pulse oximeter and return demonstration;Exhibits proper breathing techniques, such as pursed lip breathing or other method taught during program session;Demonstrates proper use of MDI's Exhibits compliance with exercise, home  and travel O2 prescription;Maintenance of O2 saturations>88%;Compliance with respiratory medication;Verbalizes importance of monitoring SPO2 with pulse oximeter and return demonstration;Exhibits proper breathing techniques, such as pursed lip breathing or other method taught during program session;Demonstrates proper use of MDI's      Goals/Expected Outcomes Compliance and understanding of oxygen  saturations monitoring and breathing techniques to decrease shortness of breath. Compliance and understanding of oxygen  saturations monitoring and breathing techniques to decrease shortness of breath.               Oxygen  Discharge  (Final Oxygen  Re-Evaluation):  Oxygen  Re-Evaluation - 05/12/23 0942       Program Oxygen  Prescription   Program Oxygen  Prescription Continuous    Liters per minute 2      Home Oxygen    Home Oxygen  Device Home Concentrator;Portable Concentrator;E-Tanks    Sleep Oxygen  Prescription Continuous    Liters per minute 2    Home Exercise Oxygen  Prescription Pulsed    Liters per minute 2    Home Resting Oxygen  Prescription None    Compliance with Home Oxygen  Use Yes      Goals/Expected Outcomes   Short Term Goals To learn and exhibit compliance with exercise, home and travel O2 prescription;To learn and understand importance of monitoring SPO2 with pulse oximeter and demonstrate accurate use of the pulse oximeter.;To learn and understand importance of maintaining oxygen  saturations>88%;To learn and demonstrate proper pursed lip breathing techniques or other breathing techniques. ;To learn and demonstrate proper use of respiratory medications    Long  Term Goals Exhibits compliance with exercise, home  and travel O2 prescription;Maintenance of O2 saturations>88%;Compliance with respiratory medication;Verbalizes importance of monitoring SPO2 with pulse oximeter and return demonstration;Exhibits proper breathing techniques, such as pursed lip breathing or other method taught during program session;Demonstrates proper use of MDI's    Goals/Expected Outcomes Compliance and understanding of oxygen  saturations monitoring and breathing techniques to decrease shortness of breath.  Initial Exercise Prescription:   Perform Capillary Blood Glucose checks as needed.  Exercise Prescription Changes:   Exercise Prescription Changes     Row Name 11/24/22 0941 12/08/22 1152 12/22/22 1158 01/10/23 0900 01/24/23 1100     Response to Exercise   Blood Pressure (Admit) 117/75 104/60 128/96 96/66 100/60   Blood Pressure (Exercise) -- -- -- -- 118/64   Blood Pressure (Exit) 96/58 100/64 108/62  94/68 96/58   Heart Rate (Admit) 81 bpm 77 bpm 72 bpm 73 bpm 65 bpm   Heart Rate (Exercise) 97 bpm 92 bpm 91 bpm 106 bpm 86 bpm   Heart Rate (Exit) 91 bpm 81 bpm 79 bpm 84 bpm 72 bpm   Oxygen  Saturation (Admit) 95 % 98 % 97 % 100 % 98 %   Oxygen  Saturation (Exercise) 91 % 95 % 98 % 92 % 95 %   Oxygen  Saturation (Exit) 97 % 96 % 97 % 98 % 98 %   Rating of Perceived Exertion (Exercise) 12.5 12 13  12.5 13   Perceived Dyspnea (Exercise) 3 2 2 2 1    Duration Continue with 30 min of aerobic exercise without signs/symptoms of physical distress. Continue with 30 min of aerobic exercise without signs/symptoms of physical distress. Continue with 30 min of aerobic exercise without signs/symptoms of physical distress. Continue with 30 min of aerobic exercise without signs/symptoms of physical distress. Continue with 30 min of aerobic exercise without signs/symptoms of physical distress.   Intensity THRR unchanged THRR unchanged THRR unchanged THRR unchanged THRR unchanged     Progression   Progression Continue to progress workloads to maintain intensity without signs/symptoms of physical distress. Continue to progress workloads to maintain intensity without signs/symptoms of physical distress. Continue to progress workloads to maintain intensity without signs/symptoms of physical distress. Continue to progress workloads to maintain intensity without signs/symptoms of physical distress. Continue to progress workloads to maintain intensity without signs/symptoms of physical distress.     Resistance Training   Training Prescription Yes Yes Yes Yes Yes   Weight blue bands blue bands blue bands blue bands blue bands   Reps 10-15 10-15 10-15 10-15 10-15   Time 10 Minutes 10 Minutes 10 Minutes 10 Minutes 10 Minutes     Interval Training   Interval Training -- Yes Yes Yes --   Equipment -- Treadmill Treadmill Treadmill --   Comments -- 39min@3mph  and 2% incline, @2mph  and 0% incline 59min@2mph &0%incline,  67min@mph &2%incline 9min@1 .33mph&0%incline, 110min@3mph &2%incline --     Oxygen    Oxygen  Continuous Continuous Continuous Continuous Continuous   Liters 2 2 2 2 2      Treadmill   MPH 2.1 3 -- 2 1.8   Grade 1 2 -- 3 0   Minutes 15 15 -- 15 15   METs 2.2 4.1 -- 4.12 2.2     Recumbant Elliptical   Level 2 2 3 3 4    RPM -- -- -- -- 50   Minutes 15 15 15 15 15    METs 2.6 3 2.4 2.7 2.9     Oxygen    Maintain Oxygen  Saturation 88% or higher 88% or higher 88% or higher 88% or higher 88% or higher    Row Name 02/02/23 1158 02/21/23 1200 03/07/23 1100 03/21/23 1100 04/04/23 1100     Response to Exercise   Blood Pressure (Admit) 104/60 100/60 98/60 96/56  100/62   Blood Pressure (Exercise) -- 142/82 134/78 118/54 110/64   Blood Pressure (Exit) 94/68 92/62 90/64  90/56 94/58   Heart Rate (Admit)  75 bpm 76 bpm 67 bpm 67 bpm 72 bpm   Heart Rate (Exercise) 97 bpm 110 bpm 95 bpm 99 bpm 94 bpm   Heart Rate (Exit) 77 bpm 90 bpm 97 bpm 82 bpm 78 bpm   Oxygen  Saturation (Admit) 97 % 97 % 98 % 96 % 97 %   Oxygen  Saturation (Exercise) 95 % 92 % 93 % 93 % 92 %   Oxygen  Saturation (Exit) 98 % 97 % 97 % 96 % 95 %   Rating of Perceived Exertion (Exercise) 13 12.5 13 13 13    Perceived Dyspnea (Exercise) 2 2 2 2 3    Duration Continue with 30 min of aerobic exercise without signs/symptoms of physical distress. Continue with 30 min of aerobic exercise without signs/symptoms of physical distress. Continue with 30 min of aerobic exercise without signs/symptoms of physical distress. Continue with 30 min of aerobic exercise without signs/symptoms of physical distress. Continue with 30 min of aerobic exercise without signs/symptoms of physical distress.   Intensity THRR unchanged THRR unchanged THRR unchanged THRR unchanged THRR unchanged     Progression   Progression Continue to progress workloads to maintain intensity without signs/symptoms of physical distress. Continue to progress workloads to maintain intensity  without signs/symptoms of physical distress. Continue to progress workloads to maintain intensity without signs/symptoms of physical distress. Continue to progress workloads to maintain intensity without signs/symptoms of physical distress. Continue to progress workloads to maintain intensity without signs/symptoms of physical distress.     Resistance Training   Training Prescription Yes Yes Yes Yes Yes   Weight blue bands blue bands blue bands blue bands blue bands   Reps 10-15 10-15 10-15 10-15 10-15   Time 10 Minutes 10 Minutes 10 Minutes 10 Minutes 10 Minutes     Interval Training   Interval Training Yes Yes Yes Yes Yes   Equipment Treadmill Treadmill Treadmill Treadmill Treadmill   Comments 70min@1 .77moh&0%incline, 76min1.8mph&3%incline -- 81min@2mph &3%incline METS3.2, 13min1.8mph&0%incline, METS2.2 67min@2 .76mph&3%incline METS 3.6, 57min@1 .41mph&0%incline METS 2.2 67min@2 .10mph&3%incline METS 3.6, 35min@1 .63mph&0%incline METS 2.2     Oxygen    Oxygen  Continuous Continuous Continuous Continuous Continuous   Liters 2 2 2 2 2      Treadmill   MPH -- 2 2 -- --   Grade -- 3 3 -- --   Minutes -- 15 15 -- --   METs -- 3.36 3.36 -- --     Recumbant Elliptical   Level 4 3 4 4 4    RPM -- 58 -- -- 56   Watts -- 78 -- -- 75   Minutes 15 15 15 15 15    METs 3.5 3.6 3.8 4 3.5     Oxygen    Maintain Oxygen  Saturation 88% or higher 88% or higher 88% or higher 88% or higher 88% or higher    Row Name 04/11/23 1451 05/02/23 1200 05/16/23 1100         Response to Exercise   Blood Pressure (Admit) 96/58 102/58 94/64     Blood Pressure (Exercise) -- 110/64 106/72     Blood Pressure (Exit) 92/60 102/68 104/60     Heart Rate (Admit) 69 bpm 69 bpm 68 bpm     Heart Rate (Exercise) 126 bpm 121 bpm 108 bpm     Heart Rate (Exit) 96 bpm 91 bpm 78 bpm     Oxygen  Saturation (Admit) 99 % 97 % 99 %  2L     Oxygen  Saturation (Exercise) 90 % 90 % 93 %  2L     Oxygen  Saturation (  Exit) 97 % 96 % 96 %  2L     Rating of  Perceived Exertion (Exercise) 13 12 13      Perceived Dyspnea (Exercise) 3 2 2      Duration Continue with 30 min of aerobic exercise without signs/symptoms of physical distress. Continue with 30 min of aerobic exercise without signs/symptoms of physical distress. Continue with 30 min of aerobic exercise without signs/symptoms of physical distress.     Intensity THRR unchanged THRR unchanged THRR unchanged       Progression   Progression Continue to progress workloads to maintain intensity without signs/symptoms of physical distress. Continue to progress workloads to maintain intensity without signs/symptoms of physical distress. Continue to progress workloads to maintain intensity without signs/symptoms of physical distress.       Resistance Training   Training Prescription Yes Yes Yes     Weight blue bands blue bands blue bands     Reps 10-15 10-15 10-15     Time 10 Minutes 10 Minutes 10 Minutes       Interval Training   Interval Training Yes Yes Yes     Equipment Treadmill Treadmill Treadmill     Comments 17min@2 .51mph&3%incline METS 3.6, 62min@1 .93mph&0%incline METS 2.2 55min@2 .68mph&3%incline METS 3.6, 69min@1 .42mph&0%incline METS 2.2 54min@2 .62mph&3%incline METS 3.6, 48min@1 .78mph&0%incline METS 2.2       Oxygen    Oxygen  Continuous Continuous Continuous     Liters 2 2 2        Recumbant Elliptical   Level 5 5 5      RPM 56 58 --     Watts 75 84 --     Minutes 15 15 15      METs 3.3 4 4.2       Oxygen    Maintain Oxygen  Saturation 88% or higher 88% or higher 88% or higher              Exercise Comments:   Exercise Goals and Review:   Exercise Goals Re-Evaluation :  Exercise Goals Re-Evaluation     Row Name 11/23/22 1159 12/19/22 0906 01/16/23 0922 02/13/23 1202 03/20/23 0903     Exercise Goal Re-Evaluation   Exercise Goals Review Increase Physical Activity;Able to understand and use Dyspnea scale;Understanding of Exercise Prescription;Increase Strength and Stamina;Knowledge and  understanding of Target Heart Rate Range (THRR);Able to understand and use rate of perceived exertion (RPE) scale Increase Physical Activity;Able to understand and use Dyspnea scale;Understanding of Exercise Prescription;Increase Strength and Stamina;Knowledge and understanding of Target Heart Rate Range (THRR);Able to understand and use rate of perceived exertion (RPE) scale Increase Physical Activity;Able to understand and use Dyspnea scale;Understanding of Exercise Prescription;Increase Strength and Stamina;Knowledge and understanding of Target Heart Rate Range (THRR);Able to understand and use rate of perceived exertion (RPE) scale Increase Physical Activity;Able to understand and use Dyspnea scale;Understanding of Exercise Prescription;Increase Strength and Stamina;Knowledge and understanding of Target Heart Rate Range (THRR);Able to understand and use rate of perceived exertion (RPE) scale Increase Physical Activity;Able to understand and use Dyspnea scale;Understanding of Exercise Prescription;Increase Strength and Stamina;Knowledge and understanding of Target Heart Rate Range (THRR);Able to understand and use rate of perceived exertion (RPE) scale   Comments Shateka has completed 6 exercise sessions. She exercises for 15 min on the recumbent elliptical and treadmill. She averages 2.6 METs at level 2 on the recumbent elliptical and 2.1 METs at 1.7 mph on the treadmill. She performs the warmup and cooldown standing without limitations. Carlisle has increased her workload for the recumbent elliptical as METs have remained the same. Dinamarie has  also progressed to treadmill walking. She did not tolerate treadmill walking on her first day of exercise but does now. Will continue to monitor and progress as able. Bineta has completed 9 exercise sessions. She exercises for 15 min on the recumbent elliptical and treadmill. She averages 3 METs at level 2 on the recumbent elliptical and 2.2 METs on the treadmill. She  performs the warmup and cooldown standing without limitations. Dream has progressed to interval training on the treadmill. She walks at 1.8 mph for 2 min and 2 mph and 3% incline for 4 min. Normagene tolerates interval walking so far. Will increased level on recumbent elliptical. Will continue to monitor and progress as able. Brandii has completed 14 exercise sessions. She exercises for 15 min on the recumbent elliptical and treadmill. She averages 2.7 METs at level 3 on the recumbent elliptical and 3.2 METs on the treadmill. She performs the warmup and cooldown standing without limitations. She has increased her interval training. Low speed and incline is 3 mph with 2% incline and 2 mph with 1.5 incine. Nate has not increased her recumbent elliptical level. Will encourage her to increase recumbent elliptical level. Will continue to monitor and progress as able. Lorella has completed 20 exercise sessions. She exercises for 15 min on the recumbent elliptical and treadmill. She averages 3.5 METs at level 4 on the recumbent elliptical and 2.9 METs on the treadmill. She performs the warmup and cooldown standing without limitations. Caisyn has increased her level on the recumbent elliptical. She tolerates this progression wel. Her treadmill speed has also slightly increased. I asked Belky if she wanted to discuss home exercise. Nevaiah feels she has all the resources she needs to exercise at home. Will continue to monitor and progress as able. Kortnee has completed 27 exercise sessions. She exercises for 15 min on the recumbent elliptical and treadmill. She averages 3.8 METs at level 4 on the recumbent elliptical and 3.6 METs on the treadmill. She performs the warmup and cooldown standing without limitations. Brilee has increased her speed and incline on the treadmill as her intervals have changed. Shaiya does intervals on certain days. Will continue to monitor and progress as able.   Expected Outcomes Through exercise at  rehab and home, the patient will decrease shortness of breath with daily activities and feel confident in carrying out an exercise regimen at home. Through exercise at rehab and home, the patient will decrease shortness of breath with daily activities and feel confident in carrying out an exercise regimen at home. Through exercise at rehab and home, the patient will decrease shortness of breath with daily activities and feel confident in carrying out an exercise regimen at home. Through exercise at rehab and home, the patient will decrease shortness of breath with daily activities and feel confident in carrying out an exercise regimen at home. Through exercise at rehab and home, the patient will decrease shortness of breath with daily activities and feel confident in carrying out an exercise regimen at home.    Row Name 04/12/23 1047 05/12/23 0939           Exercise Goal Re-Evaluation   Exercise Goals Review Increase Physical Activity;Able to understand and use Dyspnea scale;Understanding of Exercise Prescription;Increase Strength and Stamina;Knowledge and understanding of Target Heart Rate Range (THRR);Able to understand and use rate of perceived exertion (RPE) scale Increase Physical Activity;Able to understand and use Dyspnea scale;Understanding of Exercise Prescription;Increase Strength and Stamina;Knowledge and understanding of Target Heart Rate Range (THRR);Able to understand and  use rate of perceived exertion (RPE) scale      Comments Aralynn has completed 33 exercise sessions. She exercises for 15 min on the recumbent elliptical and treadmill. She averages 3.3 METs at level 5 on the recumbent elliptical and 2.2-3.6 METs on the treadmill. She performs the warmup and cooldown standing without limitations. Sallye has increased her level on the recumbent elliptical as METs remain relatively the same. Her speed has increased in her high interval. She continue to tolerate progressions well. Will continue  to monitor and progress as able. Henya has completed 39 exercise sessions. She exercises for 15 min on the recumbent elliptical and treadmill. She averages 3.9 METs at level 5 on the recumbent elliptical and 1.8-2.3 METs on the treadmill. She performs the warmup and cooldown standing without limitations. Brownie has increased her level on the recumbent elliptical as METs have increased. Her intervals on the treadmill have remained the relatively the same. Will continue to monitor and progress as able.      Expected Outcomes Through exercise at rehab and home, the patient will decrease shortness of breath with daily activities and feel confident in carrying out an exercise regimen at home. Through exercise at rehab and home, the patient will decrease shortness of breath with daily activities and feel confident in carrying out an exercise regimen at home.               Discharge Exercise Prescription (Final Exercise Prescription Changes):  Exercise Prescription Changes - 05/16/23 1100       Response to Exercise   Blood Pressure (Admit) 94/64    Blood Pressure (Exercise) 106/72    Blood Pressure (Exit) 104/60    Heart Rate (Admit) 68 bpm    Heart Rate (Exercise) 108 bpm    Heart Rate (Exit) 78 bpm    Oxygen  Saturation (Admit) 99 %   2L   Oxygen  Saturation (Exercise) 93 %   2L   Oxygen  Saturation (Exit) 96 %   2L   Rating of Perceived Exertion (Exercise) 13    Perceived Dyspnea (Exercise) 2    Duration Continue with 30 min of aerobic exercise without signs/symptoms of physical distress.    Intensity THRR unchanged      Progression   Progression Continue to progress workloads to maintain intensity without signs/symptoms of physical distress.      Resistance Training   Training Prescription Yes    Weight blue bands    Reps 10-15    Time 10 Minutes      Interval Training   Interval Training Yes    Equipment Treadmill    Comments 37min@2 .68mph&3%incline METS 3.6, 52min@1 .41mph&0%incline  METS 2.2      Oxygen    Oxygen  Continuous    Liters 2      Recumbant Elliptical   Level 5    Minutes 15    METs 4.2      Oxygen    Maintain Oxygen  Saturation 88% or higher             Nutrition:  Target Goals: Understanding of nutrition guidelines, daily intake of sodium 1500mg , cholesterol 200mg , calories 30% from fat and 7% or less from saturated fats, daily to have 5 or more servings of fruits and vegetables.  Biometrics:    Nutrition Therapy Plan and Nutrition Goals:  Nutrition Therapy & Goals - 05/09/23 1109       Nutrition Therapy   Diet General Healthy Diet      Personal Nutrition Goals   Nutrition  Goal Patient to improve dietary quality by using the plate method as a daily guide for meal planning to include lean protein/plant protein, fruits, vegetables, whole grains, and nonfat/low fat dairy as part of well balanced diet   goal in progress.   Personal Goal #2 Patient to identify strategies for weight loss of 0.5-2.0# per week of weight loss.   goal in progress.   Comments Goals in progress.  Shenoa has medical history of pulmonary HTN, COPD3, chronic respiratory failure. Per [facility] transplant RD documentation on 10/05/22, it is recommended that she lose weight to BMI 30/170# and ultimately BMI 27/150#. She reports following a high protein diet, tracking calories ~1400kcals/day. She has previously completed pulmonary rehab in February 2024; her starting weight at that time was 69.8kg/153.6#. She is down 3.7# since starting pulmonary rehab this time.  She does acknowledge some emotional eating/binge type eating tendencies; she has started seeing behavioral health counselor. Answered patient questions about constipation, fiber ,etc.   Yamaris would continue to benefit from weight loss and decrease intake of refined carbohydrates to support pulmonary disease.      Intervention Plan   Intervention Prescribe, educate and counsel regarding individualized specific dietary  modifications aiming towards targeted core components such as weight, hypertension, lipid management, diabetes, heart failure and other comorbidities.;Nutrition handout(s) given to patient.    Expected Outcomes Short Term Goal: Understand basic principles of dietary content, such as calories, fat, sodium, cholesterol and nutrients.;Long Term Goal: Adherence to prescribed nutrition plan.             Nutrition Assessments:  MEDIFICTS Score Key: >=70 Need to make dietary changes  40-70 Heart Healthy Diet <= 40 Therapeutic Level Cholesterol Diet  Flowsheet Row PULMONARY REHAB CHRONIC OBSTRUCTIVE PULMONARY DISEASE from 02/22/2022 in [facility]  Picture Your Plate Total Score on Discharge 50      Picture Your Plate Scores: <21 Unhealthy dietary pattern with much room for improvement. 41-50 Dietary pattern unlikely to meet recommendations for good health and room for improvement. 51-60 More healthful dietary pattern, with some room for improvement.  >60 Healthy dietary pattern, although there may be some specific behaviors that could be improved.    Nutrition Goals Re-Evaluation:  Nutrition Goals Re-Evaluation     Row Name 11/29/22 1532 03/13/23 1439 04/11/23 1129 05/09/23 1109       Goals   Current Weight 177 lb 14.6 oz (80.7 kg) 178 lb 5.6 oz (80.9 kg) 176 lb 12.9 oz (80.2 kg) 174 lb 2.6 oz (79 kg)    Comment A1c 5.9; other most recent labs  LDL 134, cholesterol 208 A1c 5.9; other most recent labs LDL 134, cholesterol 208 A1c 5.9; other most recent labs LDL 134, cholesterol 208 no new labs; most recent labs A1c 5.9,LDL 134, cholesterol 208    Expected Outcome Goals in progress. Jessicarose has medical history of pulmonary HTN, COPD3, chronic respiratory failure. Per [facility] transplant RD documentation on 10/05/22, it is recommended that she lose weight to BMI 30/170# and ultimately BMI 27/150#. She does report history of over snacking  on refined carbohydates, snacking in the middle of the night, etc. She has previously completed pulmonary rehab in February 2024; her starting weight at that time was 69.8kg/153.6#. She reports using MyFitness Pal app to aid with tracking calories with goal set at ~1200kcals per patient. She has maintained her weight since starting with our program. She does acknowledge some emotional eating/binge type eating tendencies; she  will begin seeing a behavioral health counselor. Adelinn would continue to benefit from weight loss and decrease intake of refined carbohydrates to support pulmonary disease. Goals in progress. Weight loss goal not met. Davanee has medical history of pulmonary HTN, COPD3, chronic respiratory failure. Per [facility] transplant RD documentation on 10/05/22, it is recommended that she lose weight to BMI 30/170# and ultimately BMI 27/150#. She does report history of over snacking on refined carbohydates, snacking in the middle of the night, etc. She has previously completed pulmonary rehab in February 2024; her starting weight at that time was 69.8kg/153.6#. She has maintained weight over the last ~5 months. She has previously used [employer] Pal app to aid with tracking calories. She has maintained her weight since starting with our program. She does acknowledge some emotional eating/binge type eating tendencies; she has started seeing behavioral health counselor. Marcelle would continue to benefit from weight loss and decrease intake of refined carbohydrates to support pulmonary disease. Goals in progress. Weight loss goal not met; she has maintained her weight. Arnett has medical history of pulmonary HTN, COPD3, chronic respiratory failure. Per [facility] transplant RD documentation on 10/05/22, it is recommended that she lose weight to BMI 30/170# and ultimately BMI 27/150#. She does report history of over snacking on refined carbohydates, snacking in the middle of the night, etc. She has previously completed  pulmonary rehab in February 2024; her starting weight at that time was 69.8kg/153.6#. She has maintained weight over the last ~6 months. She has previously used My[employer]itness Pal app to aid with tracking calories. She has maintained her weight since starting with our program. She does acknowledge some emotional eating/binge type eating tendencies; she has started seeing behavioral health counselor. Robyne would continue to benefit from weight loss and decrease intake of refined carbohydrates to support pulmonary disease. Goals in progress. Drewcilla has medical history of pulmonary HTN, COPD3, chronic respiratory failure. Per [facility] transplant RD documentation on 10/05/22, it is recommended that she lose weight to BMI 30/170# and ultimately BMI 27/150#. She reports following a high protein diet, tracking calories ~1400kcals/day. She has previously completed pulmonary rehab in February 2024; her starting weight at that time was 69.8kg/153.6#. She is down 3.7# since starting pulmonary rehab this time. She does acknowledge some emotional eating/binge type eating tendencies; she has started seeing behavioral health counselor. Answered patient questions about constipation, fiber ,etc. Janille would continue to benefit from weight loss and decrease intake of refined carbohydrates to support pulmonary disease.             Nutrition Goals Discharge (Final Nutrition Goals Re-Evaluation):  Nutrition Goals Re-Evaluation - 05/09/23 1109       Goals   Current Weight 174 lb 2.6 oz (79 kg)    Comment no new labs; most recent labs A1c 5.9,LDL 134, cholesterol 208    Expected Outcome Goals in progress. Camden has medical history of pulmonary HTN, COPD3, chronic respiratory failure. Per [facility] transplant RD documentation on 10/05/22, it is recommended that she lose weight to BMI 30/170# and ultimately BMI 27/150#. She reports following a high protein diet, tracking calories ~1400kcals/day. She has previously completed pulmonary  rehab in February 2024; her starting weight at that time was 69.8kg/153.6#. She is down 3.7# since starting pulmonary rehab this time. She does acknowledge some emotional eating/binge type eating tendencies; she has started seeing behavioral health counselor. Answered patient questions about constipation, fiber ,etc. Kjersten would continue to benefit from weight loss and decrease intake of refined carbohydrates to support pulmonary  disease.             Psychosocial: Target Goals: Acknowledge presence or absence of significant depression and/or stress, maximize coping skills, provide positive support system. Participant is able to verbalize types and ability to use techniques and skills needed for reducing stress and depression.  Initial Review & Psychosocial Screening:   Quality of Life Scores:  Scores of 19 and below usually indicate a poorer quality of life in these areas.  A difference of  2-3 points is a clinically meaningful difference.  A difference of 2-3 points in the total score of the Quality of Life Index has been associated with significant improvement in overall quality of life, self-image, physical symptoms, and general health in studies assessing change in quality of life.  PHQ-9: Review Flowsheet  More data exists      03/29/2023 03/16/2023 03/01/2023 02/15/2023 01/18/2023  Depression screen PHQ 2/9  Decreased Interest       Down, Depressed, Hopeless       PHQ - 2 Score       Altered sleeping       Tired, decreased energy       Change in appetite       Feeling bad or failure about yourself        Trouble concentrating       Moving slowly or fidgety/restless       Suicidal thoughts       PHQ-9 Score       Difficult doing work/chores         Details       Information is confidential and restricted. Go to Review Flowsheets to unlock data.        Interpretation of Total Score  Total Score Depression Severity:  1-4 = Minimal depression, 5-9 = Mild depression, 10-14  = Moderate depression, 15-19 = Moderately severe depression, 20-27 = Severe depression   Psychosocial Evaluation and Intervention:   Psychosocial Re-Evaluation:  Psychosocial Re-Evaluation     Row Name 11/23/22 1130 12/16/22 9147 01/18/23 1535 02/15/23 1034 03/15/23 1118     Psychosocial Re-Evaluation   Current issues with Current Stress Concerns;Current Anxiety/Panic;History of Depression;Current Depression Current Depression;History of Depression;Current Psychotropic Meds;Current Anxiety/Panic;Current Stress Concerns Current Depression;History of Depression;Current Psychotropic Meds;Current Anxiety/Panic;Current Stress Concerns Current Depression;History of Depression;Current Psychotropic Meds;Current Anxiety/Panic;Current Stress Concerns Current Depression;History of Depression;Current Psychotropic Meds;Current Stress Concerns   Comments Elvena has met with her psychiatrist and has also been referred to a counseler. No needs at this time. Tayden denies any new psychosocial barriers or concerns. She has started taking psychotropic meds to help with her anxiety and depression. She is also working with a therapist to help her navigate her feelings of worry/anger. Jemeka is currently struggling with making the decision to go through with an endobronchial valve replacement. She states her husband does not want her to due to the risk associated with the procedure. This decision she has to make has increased her stress level. She is still working with her mental health therapist and taking her psychotropic meds. She states she feels her depression is a little better. Leaya has made the decision not to go through with the endobronchial valve replacement. She felt the risk outweighed the benefits. She has peace about this decision. She is going to go through with the lung transplant evaluation which she is hopeful about. She feels her depression is stable at this time. She is compliant with taking her  psychotropic meds and meeting with her mental health  specialist.  No new barriers or concerns at this time. Sashia denies any new psychosocial barriers or concerns at this time. She is still working with her therapist and taking her psychotropic meds.   Expected Outcomes For Whitleigh to attend the program with less psychosocial issues or concerns. For Viola to attend the program with less psychosocial issues or concerns. For Jillann to attend the program with less psychosocial issues or concerns. For Natiyah to attend the program with less psychosocial issues or concerns. For Goldene to attend the program with less psychosocial issues or concerns.   Interventions Encouraged to attend Pulmonary Rehabilitation for the exercise Encouraged to attend Pulmonary Rehabilitation for the exercise;Stress management education Encouraged to attend Pulmonary Rehabilitation for the exercise;Stress management education Encouraged to attend Pulmonary Rehabilitation for the exercise Encouraged to attend Pulmonary Rehabilitation for the exercise   Continue Psychosocial Services  Follow up required by staff Follow up required by staff Follow up required by staff Follow up required by staff No Follow up required    Row Name 04/10/23 1429 05/10/23 0845           Psychosocial Re-Evaluation   Current issues with Current Depression;History of Depression;Current Psychotropic Meds;Current Stress Concerns Current Depression;History of Depression;Current Psychotropic Meds;Current Stress Concerns      Comments Melitta denies any new psychosocial barriers or concerns at this time. She is still working with her therapist and taking her psychotropic meds. Loyal denies any new psychosocial barriers or concerns at this time. She has made some friends in class and this has seem to brighten her spirits. She also has been losing weight which has helped her mental health. She is compliant with taking her psychotropic meds and is still working  with her mental health specialist.      Expected Outcomes For Jaynell to attend the program with less psychosocial issues or concerns. For [place] to attend the program with less psychosocial issues or concerns.      Interventions Encouraged to attend Pulmonary Rehabilitation for the exercise Encouraged to attend Pulmonary Rehabilitation for the exercise      Continue Psychosocial Services  No Follow up required No Follow up required               Psychosocial Discharge (Final Psychosocial Re-Evaluation):  Psychosocial Re-Evaluation - 05/10/23 0845       Psychosocial Re-Evaluation   Current issues with Current Depression;History of Depression;Current Psychotropic Meds;Current Stress Concerns    Comments Frady denies any new psychosocial barriers or concerns at this time. She has made some friends in class and this has seem to brighten her spirits. She also has been losing weight which has helped her mental health. She is compliant with taking her psychotropic meds and is still working with her mental health specialist.    Expected Outcomes For Ramisa to attend the program with less psychosocial issues or concerns.    Interventions Encouraged to attend Pulmonary Rehabilitation for the exercise    Continue Psychosocial Services  No Follow up required             Education: Education Goals: Education classes will be provided on a weekly basis, covering required topics. Participant will state understanding/return demonstration of topics presented.  Learning Barriers/Preferences:   Education Topics: Know Your Numbers Group instruction that is supported by a PowerPoint presentation. Instructor discusses importance of knowing and understanding resting, exercise, and post-exercise oxygen  saturation, heart rate, and blood pressure. Oxygen  saturation, heart rate, blood pressure, rating of perceived exertion, and dyspnea  are reviewed along with a normal range for these values.  Flowsheet Row  PULMONARY REHAB CHRONIC OBSTRUCTIVE PULMONARY DISEASE from 01/05/2023 in [facility], [facility]ascular, [facility]  Date 01/05/23  Educator EP  Instruction Review Code 1- Verbalizes Understanding       Exercise for the Pulmonary Patient Group instruction that is supported by a PowerPoint presentation. Instructor discusses benefits of exercise, core components of exercise, frequency, duration, and intensity of an exercise routine, importance of utilizing pulse oximetry during exercise, safety while exercising, and options of places to exercise outside of rehab.  Flowsheet Row PULMONARY REHAB CHRONIC OBSTRUCTIVE PULMONARY DISEASE from 03/30/2023 in [facility], Vascular, & Lung Health  Date 03/30/23  Educator EP  Instruction Review Code 1- Verbalizes Understanding       MET Level  Group instruction provided by PowerPoint, verbal discussion, and written material to support subject matter. Instructor reviews what METs are and how to increase METs.  Flowsheet Row PULMONARY REHAB CHRONIC OBSTRUCTIVE PULMONARY DISEASE from 11/24/2022 in [facility] for Heart, Vascular, & Lung Health  Date 11/24/22  Educator EP  Instruction Review Code 1- Verbalizes Understanding       Pulmonary Medications Verbally interactive group education provided by instructor with focus on inhaled medications and proper administration. Flowsheet Row PULMONARY REHAB CHRONIC OBSTRUCTIVE PULMONARY DISEASE from 12/22/2022 in [facility] for Heart, Vascular, & Lung Health  Date 12/22/22  Educator RT  Instruction Review Code 1- Verbalizes Understanding       Anatomy and Physiology of the Respiratory System Group instruction provided by PowerPoint, verbal discussion, and written material to support subject matter. Instructor reviews respiratory cycle and anatomical components of the respiratory system and their  functions. Instructor also reviews differences in obstructive and restrictive respiratory diseases with examples of each.  Flowsheet Row PULMONARY REHAB CHRONIC OBSTRUCTIVE PULMONARY DISEASE from 12/15/2022 in [facility] for Heart, Vascular, & Lung Health  Date 12/15/22  Educator RT  Instruction Review Code 1- Verbalizes Understanding       Oxygen  Safety Group instruction provided by PowerPoint, verbal discussion, and written material to support subject matter. There is an overview of "What is Oxygen " and "Why do we need it".  Instructor also reviews how to create a safe environment for oxygen  use, the importance of using oxygen  as prescribed, and the risks of noncompliance. There is a brief discussion on traveling with oxygen  and resources the patient may utilize. Flowsheet Row PULMONARY REHAB CHRONIC OBSTRUCTIVE PULMONARY DISEASE from 01/27/2022 in [facility] for Heart, Vascular, & Lung Health  Date 01/27/22  Educator RN  Instruction Review Code 1- Verbalizes Understanding       Oxygen  Use Group instruction provided by PowerPoint, verbal discussion, and written material to discuss how supplemental oxygen  is prescribed and different types of oxygen  supply systems. Resources for more information are provided.  Flowsheet Row PULMONARY REHAB CHRONIC OBSTRUCTIVE PULMONARY DISEASE from 01/19/2023 in [facility] for Heart, Vascular, & Lung Health  Date 01/19/23  Educator RT  Instruction Review Code 1- Verbalizes Understanding       Breathing Techniques Group instruction that is supported by demonstration and informational handouts. Instructor discusses the benefits of pursed lip and diaphragmatic breathing and detailed demonstration on how to perform both.  Flowsheet Row PULMONARY REHAB CHRONIC OBSTRUCTIVE PULMONARY DISEASE from 04/27/2023 in [facility] for Heart, Vascular, &  Lung Health  Date  04/27/23  Educator RN  Instruction Review Code 1- Verbalizes Understanding        Risk Factor Reduction Group instruction that is supported by a PowerPoint presentation. Instructor discusses the definition of a risk factor, different risk factors for pulmonary disease, and how the heart and lungs work together. Flowsheet Row PULMONARY REHAB CHRONIC OBSTRUCTIVE PULMONARY DISEASE from 02/16/2023 in [facility] for Heart, Vascular, & Lung Health  Date 02/16/23  Educator EP  Instruction Review Code 1- Verbalizes Understanding       Pulmonary Diseases Group instruction provided by PowerPoint, verbal discussion, and written material to support subject matter. Instructor gives an overview of the different type of pulmonary diseases. There is also a discussion on risk factors and symptoms as well as ways to manage the diseases. Flowsheet Row PULMONARY REHAB CHRONIC OBSTRUCTIVE PULMONARY DISEASE from 03/23/2023 in [facility] for Heart, Vascular, & Lung Health  Date 03/23/23  Educator RT  Instruction Review Code 1- Verbalizes Understanding       Stress and Energy Conservation Group instruction provided by PowerPoint, verbal discussion, and written material to support subject matter. Instructor gives an overview of stress and the impact it can have on the body. Instructor also reviews ways to reduce stress. There is also a discussion on energy conservation and ways to conserve energy throughout the day. Flowsheet Row PULMONARY REHAB CHRONIC OBSTRUCTIVE PULMONARY DISEASE from 05/04/2023 in [facility] for Heart, Vascular, & Lung Health  Date 05/04/23  Educator RN  Instruction Review Code 1- Verbalizes Understanding       Warning Signs and Symptoms Group instruction provided by PowerPoint, verbal discussion, and written material to support subject matter. Instructor reviews warning signs and symptoms of stroke, heart  attack, cold and flu. Instructor also reviews ways to prevent the spread of infection. Flowsheet Row PULMONARY REHAB CHRONIC OBSTRUCTIVE PULMONARY DISEASE from 05/11/2023 in [facility] for Heart, Vascular, & Lung Health  Date 05/11/23  Educator RN  Instruction Review Code 1- Verbalizes Understanding       Other Education Group or individual verbal, written, or video instructions that support the educational goals of the pulmonary rehab program. Flowsheet Row PULMONARY REHAB CHRONIC OBSTRUCTIVE PULMONARY DISEASE from 03/16/2023 in [facility] for Heart, Vascular, & Lung Health  Date 03/16/23  Educator EP  Instruction Review Code 1- Verbalizes Understanding        Knowledge Questionnaire Score:   Core Components/Risk Factors/Patient Goals at Admission:   Core Components/Risk Factors/Patient Goals Review:   Goals and Risk Factor Review     Row Name 11/23/22 1137 12/16/22 0928 01/18/23 1539 02/15/23 1037 03/15/23 1120     Core Components/Risk Factors/Patient Goals Review   Personal Goals Review Weight Management/Obesity;Improve shortness of breath with ADL's;Develop more efficient breathing techniques such as purse lipped breathing and diaphragmatic breathing and practicing self-pacing with activity. Weight Management/Obesity;Improve shortness of breath with ADL's;Develop more efficient breathing techniques such as purse lipped breathing and diaphragmatic breathing and practicing self-pacing with activity.;Stress Weight Management/Obesity;Improve shortness of breath with ADL's Weight Management/Obesity;Improve shortness of breath with ADL's;Stress Weight Management/Obesity;Improve shortness of breath with ADL's;Stress   Review Goal progressing for weight loss. Azareya is working with staff dietitian to achieve her weight loss goals. Goal progressing on improving shortness of breath with ADL's. Goal progressing on developing more efficient  breathing techniques such as purse lipped breathing and diaphragmatic breathing; and practicing self-pacing with  activity. increase knowledge or respiratory medications and ability to use respiratory devices properly. Addi is requiring 2L of O2 to maintain sats >88%. She has also been able to transition from walking the track to walking on the treadmill. We will continue to monitor Maurice's progress throughout the program. Goal progressing for weight loss. Goal progressing on improving shortness of breath with ADL's. Goal progressing on developing more efficient breathing techniques such as purse lipped breathing and diaphragmatic breathing; and practicing self-pacing with activity. Goal met on increasing knowledge of respiratory medications and ability to use respiratory devices properly. Camela has demonstrated proper use of MDI to staff.  Goal progressing on decreasing stress. Audrielle is currently working with a  therapist on her mental health issues. She has also started taking psychotropic meds. Kingston is requiring 2L of O2 to maintain sats >88% while exercising. We will continue to monitor Octivia's progress throughout the program. Goal progressing for weight loss. Goal progressing on improving shortness of breath with ADL's. Goal met on developing more efficient breathing techniques such as purse lipped breathing and diaphragmatic breathing; and practicing self-pacing with activity. Tenille is able to demonstrate purse lip breathing when she gets short of breath. Marlinda is also able to pace herself based on her rate of perceived exertion scale. Goal progressing on decreasing stress.  Murrel is requiring 2L of O2 to maintain sats >88% while exercising. We will continue to monitor Devonda's progress throughout the program. Goal progressing for weight loss. Goal progressing on improving shortness of breath with ADL's. Goal progressing on decreasing stress.  Manya is requiring 2L of O2 to maintain sats >88% while  exercising. She is currently exercising on the recumbant elliptical and the treadmill. We will continue to monitor Yarrow's progress throughout the program. Goal progressing for weight loss. Laquita is working with the staff dietician to obatain her weight loss goals. Goal progressing on improving shortness of breath with ADL's. Goal met on decreasing stress. She states that exercising has helped reduce her stress and she feels like her stress level is under control. Pace is requiring 2L of O2 to maintain sats >88% while exercising. She is currently exercising on the recumbant elliptical and the treadmill. We will continue to monitor Barbette's progress throughout the program.   Expected Outcomes For Makina to lose weight, improve her SOB with ADLs, decrease stress, and develop more efficient breathing techniques such as purse lipped breathing and diaphragmatic breathing; and practicing self-pacing with activity For Zoelle to lose weight, improve her SOB with ADLs, decrease stress, and develop more efficient breathing techniques such as purse lipped breathing and diaphragmatic breathing; and practicing self-pacing with activity For Austa to lose weight, improve her SOB with ADLs and decrease stress For Saraiya to lose weight, improve her SOB with ADLs and decrease stress For Kihana to lose weight and improve her SOB with ADLs    Row Name 04/10/23 9604 05/10/23 0848           Core Components/Risk Factors/Patient Goals Review   Personal Goals Review Weight Management/Obesity;Improve shortness of breath with ADL's Weight Management/Obesity;Improve shortness of breath with ADL's      Review Monthly review of patient's Core Components/Risk Factors/Patient Goals are as follows: Goal progressing for weight loss. Santrice is working with the staff dietitian to obatain her weight loss goals. Goal progressing on improving shortness of breath with ADL's. Sharlyn is requiring 2L of O2 to maintain sats >88% while  exercising. She is currently exercising on the recumbant elliptical and the treadmill.  We will continue to monitor Jaydalyn's progress throughout the program. Monthly review of patient's Core Components/Risk Factors/Patient Goals are as follows: Goal progressing for weight loss. Giulianna has been applying the education she has receved from the dietitician as well as using weight watchers to help facilitate weight loss. Goal progressing on improving shortness of breath with ADL's. Iceis is requiring 2L of O2 to maintain sats >88% while exercising. She is currently exercising on the recumbant elliptical and the treadmill. We will continue to monitor Despina's progress throughout the program.      Expected Outcomes For Assata to lose weight and improve her SOB with ADLs For Malayna to lose weight and improve her SOB with ADLs               Core Components/Risk Factors/Patient Goals at Discharge (Final Review):   Goals and Risk Factor Review - 05/10/23 0848       Core Components/Risk Factors/Patient Goals Review   Personal Goals Review Weight Management/Obesity;Improve shortness of breath with ADL's    Review Monthly review of patient's Core Components/Risk Factors/Patient Goals are as follows: Goal progressing for weight loss. Kathrine has been applying the education she has receved from the dietitician as well as using weight watchers to help facilitate weight loss. Goal progressing on improving shortness of breath with ADL's. Tesha is requiring 2L of O2 to maintain sats >88% while exercising. She is currently exercising on the recumbant elliptical and the treadmill. We will continue to monitor Avaline's progress throughout the program.    Expected Outcomes For Rickya to lose weight and improve her SOB with ADLs             ITP Comments:Pt is making expected progress toward Pulmonary Rehab goals after completing 40 session(s). Recommend continued exercise, life style modification, education, and  utilization of breathing techniques to increase stamina and strength, while also decreasing shortness of breath with exertion.  Dr. Genetta Kenning is Medical Director for Pulmonary Rehab at [facility].     Comments:

## 2023-05-18 ENCOUNTER — Encounter (HOSPITAL_COMMUNITY)
Admission: RE | Admit: 2023-05-18 | Discharge: 2023-05-18 | Disposition: A | Discharge status: Home / Self Care | Source: Ambulatory Visit | Attending: Internal Medicine | Admitting: Internal Medicine

## 2023-05-18 DIAGNOSIS — J4489 Other specified chronic obstructive pulmonary disease: Secondary | ICD-10-CM | POA: Diagnosis not present

## 2023-05-18 DIAGNOSIS — R079 Chest pain, unspecified: Secondary | ICD-10-CM | POA: Diagnosis not present

## 2023-05-18 DIAGNOSIS — J439 Emphysema, unspecified: Secondary | ICD-10-CM | POA: Diagnosis not present

## 2023-05-18 DIAGNOSIS — J449 Chronic obstructive pulmonary disease, unspecified: Secondary | ICD-10-CM

## 2023-05-18 NOTE — Progress Notes (Signed)
 Daily Session Note  Patient Details  Name: Jill Glenn MRN: [id number] Date of Birth: 1965-08-23 Referring Provider:   Gattis Kass Pulmonary Rehab Walk Test from 10/26/2022 in [facility] for Heart, Vascular, & Lung Health  Referring Provider Dione Franks       Encounter Date: 05/18/2023  Check In:  Session Check In - 05/18/23 1154       Check-In   Supervising physician immediately available to respond to emergencies [facility] MD immediately available    Physician(s) Palmer Bobo, NP    Location [facility]    Staff Present Cindra Cree, RT;Randi Regis Captain BS, ACSM-CEP, Exercise Physiologist;Bryanda Mikel Arlester Ladd, RN, BSN;Johnny Porter, MS, Exercise Physiologist    Virtual Visit No    Medication changes reported     No    Fall or balance concerns reported    No    Tobacco Cessation No Change    Warm-up and Cool-down Performed as group-led instruction    Resistance Training Performed Yes    VAD Patient? No    PAD/SET Patient? No      Pain Assessment   Currently in Pain? No/denies    Pain Score 0-No pain    Multiple Pain Sites No             Capillary Blood Glucose: No results found for this or any previous visit (from the past 24 hours).    Social History   Tobacco Use  Smoking Status Former   Current packs/day: 0.00   Average packs/day: 1 pack/day for 40.6 years (40.6 ttl pk-yrs)   Types: Cigarettes   Start date: 03/05/1981   Quit date: 10/14/2021   Years since quitting: 1.5  Smokeless Tobacco Never    Goals Met:  Independence with exercise equipment Exercise tolerated well No report of concerns or symptoms today Strength training completed today  Goals Unmet:  Not Applicable  Comments: Service time is from 1011 to 1133    Dr. Genetta Kenning is Medical Director for Pulmonary Rehab at [facility].

## 2023-05-19 DIAGNOSIS — R9431 Abnormal electrocardiogram [ECG] [EKG]: Secondary | ICD-10-CM | POA: Insufficient documentation

## 2023-05-19 DIAGNOSIS — R052 Subacute cough: Secondary | ICD-10-CM | POA: Insufficient documentation

## 2023-05-20 ENCOUNTER — Other Ambulatory Visit: Payer: Self-pay | Admitting: Internal Medicine

## 2023-05-20 DIAGNOSIS — J301 Allergic rhinitis due to pollen: Secondary | ICD-10-CM

## 2023-05-23 ENCOUNTER — Ambulatory Visit (HOSPITAL_COMMUNITY)
Admission: RE | Admit: 2023-05-23 | Discharge: 2023-05-23 | Disposition: A | Discharge status: Home / Self Care | Source: Ambulatory Visit | Attending: Internal Medicine | Admitting: Internal Medicine

## 2023-05-23 ENCOUNTER — Other Ambulatory Visit (HOSPITAL_COMMUNITY): Payer: Self-pay | Admitting: Respiratory Therapy

## 2023-05-23 ENCOUNTER — Encounter (HOSPITAL_COMMUNITY)
Admission: RE | Admit: 2023-05-23 | Discharge: 2023-05-23 | Disposition: A | Discharge status: Home / Self Care | Source: Ambulatory Visit | Attending: Internal Medicine | Admitting: Internal Medicine

## 2023-05-23 DIAGNOSIS — J4489 Other specified chronic obstructive pulmonary disease: Secondary | ICD-10-CM | POA: Diagnosis not present

## 2023-05-23 DIAGNOSIS — J439 Emphysema, unspecified: Secondary | ICD-10-CM | POA: Insufficient documentation

## 2023-05-23 DIAGNOSIS — J449 Chronic obstructive pulmonary disease, unspecified: Secondary | ICD-10-CM | POA: Diagnosis not present

## 2023-05-23 DIAGNOSIS — R079 Chest pain, unspecified: Secondary | ICD-10-CM | POA: Diagnosis not present

## 2023-05-23 LAB — BLOOD GAS, ARTERIAL
Acid-Base Excess: 1.8 mmol/L (ref 0.0–2.0)
Bicarbonate: 24.4 mmol/L (ref 20.0–28.0)
O2 Saturation: 98.3 %
Patient temperature: 37
pCO2 arterial: 32 mmHg (ref 32–48)
pH, Arterial: 7.49 — ABNORMAL HIGH (ref 7.35–7.45)
pO2, Arterial: 82 mmHg — ABNORMAL LOW (ref 83–108)

## 2023-05-23 NOTE — Progress Notes (Signed)
 Daily Session Note  Patient Details  Name: Jill Glenn MRN: [id number] Date of Birth: 23-Mar-1965 Referring Provider:   Gattis Kass Pulmonary Rehab Walk Test from 10/26/2022 in [facility] for Heart, Vascular, & Lung Health  Referring Provider Dione Franks       Encounter Date: 05/23/2023  Check In:  Session Check In - 05/23/23 1118       Check-In   Supervising physician immediately available to respond to emergencies [facility] MD immediately available    Physician(s) Palmer Bobo, NP    Location [facility]    Staff Present Cindra Cree, RT;Copper Kirtley Regis Captain BS, ACSM-CEP, Exercise Physiologist;Mary Arlester Ladd, RN, Shasta Deist, MS, ACSM-CEP, Exercise Physiologist    Virtual Visit No    Medication changes reported     No    Fall or balance concerns reported    No    Tobacco Cessation No Change    Warm-up and Cool-down Performed as group-led instruction    Resistance Training Performed Yes    VAD Patient? No    PAD/SET Patient? No      Pain Assessment   Currently in Pain? No/denies    Multiple Pain Sites No             Capillary Blood Glucose: Results for orders placed or performed during the hospital encounter of 05/23/23 (from the past 24 hours)  Blood gas, arterial     Status: Abnormal   Collection Time: 05/23/23  9:32 AM  Result Value Ref Range   pH, Arterial 7.49 (H) 7.35 - 7.45   pCO2 arterial 32 32 - 48 mmHg   pO2, Arterial 82 (L) 83 - 108 mmHg   Bicarbonate 24.4 20.0 - 28.0 mmol/L   Acid-Base Excess 1.8 0.0 - 2.0 mmol/L   O2 Saturation 98.3 %   Patient temperature 37.0    Collection site LEFT RADIAL    Drawn by COLLECTED BY RT    Allens test (pass/fail) PASS PASS      Social History   Tobacco Use  Smoking Status Former   Current packs/day: 0.00   Average packs/day: 1 pack/day for 40.6 years (40.6 ttl pk-yrs)   Types: Cigarettes   Start date: 03/05/1981   Quit date: 10/14/2021   Years since quitting: 1.6   Smokeless Tobacco Never    Goals Met:  Independence with exercise equipment Exercise tolerated well No report of concerns or symptoms today Strength training completed today  Goals Unmet:  Not Applicable  Comments: Service time is from 1026 to 1125.    Dr. Genetta Kenning is Medical Director for Pulmonary Rehab at [facility].

## 2023-05-24 ENCOUNTER — Ambulatory Visit (INDEPENDENT_AMBULATORY_CARE_PROVIDER_SITE_OTHER)
Admission: RE | Admit: 2023-05-24 | Discharge: 2023-05-24 | Disposition: A | Discharge status: Home / Self Care | Source: Ambulatory Visit | Attending: Internal Medicine | Admitting: Internal Medicine

## 2023-05-24 DIAGNOSIS — E2839 Other primary ovarian failure: Secondary | ICD-10-CM | POA: Diagnosis not present

## 2023-05-25 ENCOUNTER — Encounter (HOSPITAL_COMMUNITY)
Admission: RE | Admit: 2023-05-25 | Discharge: 2023-05-25 | Disposition: A | Discharge status: Home / Self Care | Source: Ambulatory Visit | Attending: Internal Medicine | Admitting: Internal Medicine

## 2023-05-25 VITALS — Wt 173.1 lb

## 2023-05-25 DIAGNOSIS — J4489 Other specified chronic obstructive pulmonary disease: Secondary | ICD-10-CM | POA: Diagnosis not present

## 2023-05-25 DIAGNOSIS — J439 Emphysema, unspecified: Secondary | ICD-10-CM | POA: Diagnosis not present

## 2023-05-25 DIAGNOSIS — R079 Chest pain, unspecified: Secondary | ICD-10-CM | POA: Diagnosis not present

## 2023-05-25 DIAGNOSIS — J449 Chronic obstructive pulmonary disease, unspecified: Secondary | ICD-10-CM

## 2023-05-25 NOTE — Progress Notes (Signed)
 Daily Session Note  Patient Details  Name: Jill Glenn MRN: [id number] Date of Birth: Apr 07, 1965 Referring Provider:   Gattis Kass Pulmonary Rehab Walk Test from 10/26/2022 in [facility] for Heart, Vascular, & Lung Health  Referring Provider Dione Franks       Encounter Date: 05/25/2023  Check In:  Session Check In - 05/25/23 1053       Check-In   Supervising physician immediately available to respond to emergencies [facility] MD immediately available    Physician(s) Charles Connor, NP    Location [facility]    Staff Present Cindra Cree, RT;Randi Regis Captain BS, ACSM-CEP, Exercise Physiologist;Mary Arlester Ladd, RN, Shasta Deist, MS, ACSM-CEP, Exercise Physiologist    Virtual Visit No    Medication changes reported     No    Fall or balance concerns reported    No    Tobacco Cessation No Change    Warm-up and Cool-down Performed as group-led instruction    Resistance Training Performed Yes    VAD Patient? No    PAD/SET Patient? No      Pain Assessment   Currently in Pain? No/denies    Multiple Pain Sites No             Capillary Blood Glucose: No results found for this or any previous visit (from the past 24 hours).    Social History   Tobacco Use  Smoking Status Former   Current packs/day: 0.00   Average packs/day: 1 pack/day for 40.6 years (40.6 ttl pk-yrs)   Types: Cigarettes   Start date: 03/05/1981   Quit date: 10/14/2021   Years since quitting: 1.6  Smokeless Tobacco Never    Goals Met:  Proper associated with RPD/PD & O2 Sat Independence with exercise equipment Exercise tolerated well No report of concerns or symptoms today Strength training completed today  Goals Unmet:  Not Applicable  Comments: Service time is from 1007 to 1135.    Dr. Genetta Kenning is Medical Director for Pulmonary Rehab at [facility].

## 2023-05-27 ENCOUNTER — Ambulatory Visit: Payer: Self-pay | Admitting: Internal Medicine

## 2023-05-30 ENCOUNTER — Telehealth (HOSPITAL_COMMUNITY): Payer: Self-pay | Admitting: *Deleted

## 2023-05-30 ENCOUNTER — Encounter (HOSPITAL_COMMUNITY): Admission: RE | Admit: 2023-05-30 | Source: Ambulatory Visit

## 2023-05-30 ENCOUNTER — Encounter (HOSPITAL_COMMUNITY): Payer: Self-pay

## 2023-05-30 NOTE — Telephone Encounter (Signed)
 Pt LVM that she will not be able to attend PR today. Will cancel appt. German Koller BS, ACSM-CEP 05/30/2023 8:33 AM

## 2023-06-01 ENCOUNTER — Ambulatory Visit (HOSPITAL_COMMUNITY)
Admission: RE | Admit: 2023-06-01 | Discharge: 2023-06-01 | Disposition: A | Discharge status: Home / Self Care | Source: Ambulatory Visit | Attending: Cardiology | Admitting: Cardiology

## 2023-06-01 ENCOUNTER — Encounter (HOSPITAL_COMMUNITY)

## 2023-06-01 DIAGNOSIS — R9431 Abnormal electrocardiogram [ECG] [EKG]: Secondary | ICD-10-CM | POA: Insufficient documentation

## 2023-06-01 DIAGNOSIS — R072 Precordial pain: Secondary | ICD-10-CM | POA: Insufficient documentation

## 2023-06-01 MED ORDER — NITROGLYCERIN 0.4 MG SL SUBL
0.8000 mg | SUBLINGUAL_TABLET | Freq: Once | SUBLINGUAL | Status: AC
  Administered 2023-06-01: 0.8 mg via SUBLINGUAL

## 2023-06-01 MED ORDER — IOHEXOL 350 MG/ML SOLN
100.0000 mL | Freq: Once | INTRAVENOUS | Status: AC | PRN
  Administered 2023-06-01: 100 mL via INTRAVENOUS

## 2023-06-02 ENCOUNTER — Other Ambulatory Visit: Payer: Self-pay | Admitting: Internal Medicine

## 2023-06-02 DIAGNOSIS — I2510 Atherosclerotic heart disease of native coronary artery without angina pectoris: Secondary | ICD-10-CM | POA: Insufficient documentation

## 2023-06-02 DIAGNOSIS — J432 Centrilobular emphysema: Secondary | ICD-10-CM | POA: Diagnosis not present

## 2023-06-02 DIAGNOSIS — E785 Hyperlipidemia, unspecified: Secondary | ICD-10-CM

## 2023-06-02 DIAGNOSIS — J449 Chronic obstructive pulmonary disease, unspecified: Secondary | ICD-10-CM | POA: Diagnosis not present

## 2023-06-02 DIAGNOSIS — R0602 Shortness of breath: Secondary | ICD-10-CM | POA: Diagnosis not present

## 2023-06-02 MED ORDER — ROSUVASTATIN CALCIUM 10 MG PO TABS: 10.0000 mg | ORAL_TABLET | Freq: Every day | ORAL | 1 refills | Status: DC

## 2023-06-06 ENCOUNTER — Encounter (HOSPITAL_COMMUNITY)

## 2023-06-06 ENCOUNTER — Telehealth (HOSPITAL_COMMUNITY): Payer: Self-pay | Admitting: *Deleted

## 2023-06-06 NOTE — Telephone Encounter (Signed)
 Pt LVM stating she has a HA and nausea and will not be able to attend class today.  German Koller BS, ACSM-CEP 06/06/2023 9:44 AM

## 2023-06-07 ENCOUNTER — Ambulatory Visit (INDEPENDENT_AMBULATORY_CARE_PROVIDER_SITE_OTHER): Admitting: Licensed Clinical Social Worker

## 2023-06-07 DIAGNOSIS — F411 Generalized anxiety disorder: Secondary | ICD-10-CM | POA: Diagnosis not present

## 2023-06-07 DIAGNOSIS — F331 Major depressive disorder, recurrent, moderate: Secondary | ICD-10-CM | POA: Diagnosis not present

## 2023-06-07 NOTE — Progress Notes (Unsigned)
 THERAPIST PROGRESS NOTE   Session Date: 06/07/2023  Session Time: 1310 - 1402  Participation Level: Active  Behavioral Response: CasualAlertAnxious and Depressed  Type of Therapy: Individual Therapy  Treatment Goals addressed:  Anxiety    LTG: "Work on communicating my needs better"        Depression    LTG: Reduce frequency, intensity, and duration of depression symptoms so that daily functioning is improved      LTG: Increase coping skills to manage depression and improve ability to perform daily activities      LTG: "I just want to be happy"        ProgressTowards Goals: Progressing  Interventions: CBT, Motivational Interviewing, and Supportive  Summary: Jill Glenn is a 58 y.o. female with past psych history of MDD, GAD, and PTSD, presenting for follow-up therapy session in efforts to improve management of depressive and anxious symptoms.   Patient actively engaged in session, presenting in anxious and depressed moods, and congruent affect throughout duration of visit.  Patient openly engaged in introductory check-in, sharing of having been increasingly irritable over the past week, having learned of having Coronary Artery Disease, causing frustrations of continued presenting challenges with physical health as well as having increased number of medications needing to take. Actively engaged in reassessing recent depressive and anxious sxs over recent weeks via PHQ-9 and GAD-7, further processing significant increase in anxious sxs and minimal increase in depressive sxs. Pt further shared frustrations surrounding being bored and wanting to do things, however regularly being met with denial or negativity from spouse and daughter, proving to feel limited and add to frustrations. Actively processed feelings surrounding medical and familial imposed limitations, further processing importance for pt to engage in desired activities for herself, versus appeasing others, specifically the  anxiousness experienced by spouse and daughter.  Patient responded well to interventions. Patient continues to meet criteria for MDD, GAD, and PTSD. Patient will continue to benefit from engagement in outpatient therapy due to being the least restrictive service to meet presenting needs.      06/07/2023    1:50 PM 03/29/2023   11:14 AM 03/16/2023    1:14 PM 03/01/2023    1:08 PM 02/15/2023    3:12 PM  Depression screen PHQ 2/9  Decreased Interest 1 0 1 3 1   Down, Depressed, Hopeless 1 1 1 3 1   PHQ - 2 Score 2 1 2 6 2   Altered sleeping 1 0 2 1 3   Tired, decreased energy 1 0 2 3 1   Change in appetite 0 0 2 0 0  Feeling bad or failure about yourself  1 1 1 1 1   Trouble concentrating 1 1 2  0 2  Moving slowly or fidgety/restless 0 0 0 0 0  Suicidal thoughts 0 0 0 0 0  PHQ-9 Score 6 3 11 11 9   Difficult doing work/chores Somewhat difficult Not difficult at all Somewhat difficult Somewhat difficult Somewhat difficult      06/07/2023    1:46 PM 03/29/2023   11:11 AM 03/16/2023    1:12 PM 03/01/2023    1:06 PM  GAD 7 : Generalized Anxiety Score  Nervous, Anxious, on Edge 2 0 1 2  Control/stop worrying 3 0 1 2  Worry too much - different things 3 0 1 2  Trouble relaxing 2 0 1 2  Restless 3 1 3 1   Easily annoyed or irritable 1 0 2 2  Afraid - awful might happen 0 0 0 0  Total GAD 7  Score 14 1 9 11   Anxiety Difficulty Extremely difficult Not difficult at all Somewhat difficult Somewhat difficult     Suicidal/Homicidal: Nowithout intent/plan  Therapist Response: Clinician utilized CBT, MI, and solution focused interventions to address pt's identified presenting problems and depressive and anxious sxs.   Actively greeted patient upon presenting for today's session, engaging pt in introductory check-in, assessing presenting moods and affect, further prompting patient's reflection of recent events and factors contributing to presenting moods.  Actively listened to patient's recounts of events of  the past month, utilizing open-ended questions to further support patient in exploration of identifying thoughts and feelings surrounding recent events, stressors, and challenges, providing support and validation for patient's identified feelings.  Actively engaged in assessing depressive and anxious symptoms experienced over recent weeks via PHQ-9 and GAD-7, noting of significant increase in anxious symptoms and minimal increase in depressive symptoms, further engaging patient in processing variances.  Utilized Socratic questioning to further prompt greater critical processing of identified thoughts and feelings surrounding family members stressors and anxiousness leading to their opposition of patient engaging in any activities she finds joy or wishes to participate in.  Utilized CBT, and MI, to support patient in exploring benefits of engaging in enjoyable activities, identifying reasonable activities patient could be managed to engage in, and supporting patient and reflecting on history of self-care.  Clinician reassessed severity of depressive and anxious sxs, and presence of any safety concerns. Therapist provided support and empathy to patient during session.  Plan: Return again in 2 week.  Diagnosis:  Encounter Diagnoses  Name Primary?   Generalized anxiety disorder Yes   MDD (major depressive disorder), recurrent episode, moderate (HCC)     Collaboration of Care: Psychiatrist AEB provider notes available in EHR.  Patient/Guardian was advised Release of Information must be obtained prior to any record release in order to collaborate their care with an outside provider. Patient/Guardian was advised if they have not already done so to contact the registration department to sign all necessary forms in order for us  to release information regarding their care.   Consent: Patient/Guardian gives verbal consent for treatment and assignment of benefits for services provided during this visit.  Patient/Guardian expressed understanding and agreed to proceed.   Patsi Boots, MSW, LCSW 06/07/2023,  1:55 PM

## 2023-06-08 ENCOUNTER — Encounter (HOSPITAL_COMMUNITY)
Admission: RE | Admit: 2023-06-08 | Discharge: 2023-06-08 | Disposition: A | Discharge status: Home / Self Care | Source: Ambulatory Visit | Attending: Internal Medicine | Admitting: Internal Medicine

## 2023-06-08 DIAGNOSIS — J449 Chronic obstructive pulmonary disease, unspecified: Secondary | ICD-10-CM | POA: Insufficient documentation

## 2023-06-09 ENCOUNTER — Telehealth (HOSPITAL_COMMUNITY): Payer: Self-pay

## 2023-06-09 NOTE — Telephone Encounter (Signed)
 Called pt to check on her since she missed PR class yesterday. No answer. Left VM.

## 2023-06-13 ENCOUNTER — Encounter (HOSPITAL_COMMUNITY)
Admission: RE | Admit: 2023-06-13 | Discharge: 2023-06-13 | Disposition: A | Discharge status: Home / Self Care | Source: Ambulatory Visit | Attending: Internal Medicine | Admitting: Internal Medicine

## 2023-06-13 VITALS — Wt 172.0 lb

## 2023-06-13 DIAGNOSIS — J449 Chronic obstructive pulmonary disease, unspecified: Secondary | ICD-10-CM | POA: Diagnosis not present

## 2023-06-13 NOTE — Progress Notes (Signed)
 Daily Session Note  Patient Details  Name: Jill Glenn MRN: [id number] Date of Birth: 1965/02/07 Referring Provider:   Gattis Kass Pulmonary Rehab Walk Test from 10/26/2022 in [facility]  Referring Provider Dione Franks       Encounter Date: 06/13/2023  Check In:  Session Check In - 06/13/23 1121       Check-In   Supervising physician immediately available to respond to emergencies [facility] MD immediately available    Physician(s) Palmer Bobo, NP    Location [facility]    Staff Present Cindra Cree, RT;Randi Regis Captain BS, ACSM-CEP, Exercise Physiologist;Mary Arlester Ladd, RN, Shasta Deist, MS, ACSM-CEP, Exercise Physiologist    Virtual Visit No    Medication changes reported     No    Fall or balance concerns reported    No    Tobacco Cessation No Change    Warm-up and Cool-down Performed as group-led instruction    Resistance Training Performed Yes    VAD Patient? No    PAD/SET Patient? No      Pain Assessment   Currently in Pain? No/denies    Pain Score 0-No pain    Multiple Pain Sites No             Capillary Blood Glucose: No results found for this or any previous visit (from the past 24 hours).   Exercise Prescription Changes - 06/13/23 1100       Response to Exercise   Blood Pressure (Admit) 90/68    Blood Pressure (Exercise) 120/70    Blood Pressure (Exit) 94/94    Heart Rate (Admit) 68 bpm    Heart Rate (Exercise) 90 bpm    Heart Rate (Exit) 70 bpm    Oxygen  Saturation (Admit) 98 %    Oxygen  Saturation (Exercise) 92 %    Oxygen  Saturation (Exit) 96 %    Rating of Perceived Exertion (Exercise) 13    Perceived Dyspnea (Exercise) 2    Duration Continue with 30 min of aerobic exercise without signs/symptoms of physical distress.    Intensity THRR unchanged      Progression   Progression Continue to progress workloads to maintain intensity without signs/symptoms of physical  distress.      Resistance Training   Training Prescription Yes    Weight black bands    Reps 10-15    Time 10 Minutes      Interval Training   Interval Training Yes    Equipment Treadmill    Comments 73min@2 .102mph&3%incline METS 3.6, 18min@1 .82mph&0%incline METS 2.2      Oxygen    Oxygen  Continuous    Liters 2      Treadmill   MPH 2.3    Grade 3    Minutes 15    METs 3.71      Oxygen    Maintain Oxygen  Saturation 88% or higher             Social History   Tobacco Use  Smoking Status Former   Current packs/day: 0.00   Average packs/day: 1 pack/day for 40.6 years (40.6 ttl pk-yrs)   Types: Cigarettes   Start date: 03/05/1981   Quit date: 10/14/2021   Years since quitting: 1.6  Smokeless Tobacco Never    Goals Met:  Proper associated with RPD/PD & O2 Sat Independence with exercise equipment Exercise tolerated well No report of concerns or symptoms today Strength training completed today  Goals Unmet:  Not Applicable  Comments: Service  time is from 1007 to 1140.    Dr. Genetta Kenning is Medical Director for Pulmonary Rehab at [facility]

## 2023-06-14 NOTE — Progress Notes (Signed)
 Pulmonary Individual Treatment Plan  Patient Details  Name: Jill Glenn MRN: [id number] Date of Birth: Dec 11, 1965 Referring Provider:   Gattis Kass Pulmonary Rehab Walk Test from 10/26/2022 in [facility]  Referring Provider Dione Franks       Initial Encounter Date:  Flowsheet Row Pulmonary Rehab Walk Test from 10/26/2022 in [facility]  Date 10/26/22       Visit Diagnosis: Stage 3 severe COPD by GOLD classification (HCC)  Patient's Home Medications on Admission:   Current Outpatient Medications:    acetaminophen  (TYLENOL ) 325 MG tablet, Take 2 tablets (650 mg total) by mouth every 6 (six) hours as needed for moderate pain, mild pain or headache., Disp: , Rfl:    albuterol  (PROVENTIL ) (2.5 MG/3ML) 0.083% nebulizer solution, Take 3 mLs (2.5 mg total) by nebulization every 6 (six) hours as needed for wheezing or shortness of breath., Disp: 75 mL, Rfl: 12   albuterol  (VENTOLIN  HFA) 108 (90 Base) MCG/ACT inhaler, Inhale 2 puffs into the lungs every 6 (six) hours as needed for wheezing or shortness of breath., Disp: 18 g, Rfl: 3   Ascorbic Acid (VITAMIN C GUMMIES PO), Take 500 mg by mouth daily., Disp: , Rfl:    busPIRone  (BUSPAR ) 15 MG tablet, Take 1 tablet (15 mg total) by mouth 2 (two) times daily., Disp: 180 tablet, Rfl: 0   citalopram  (CELEXA ) 10 MG tablet, Take 1 tablet (10 mg total) by mouth daily., Disp: 90 tablet, Rfl: 0   hydrOXYzine  (ATARAX ) 25 MG tablet, Take 1 tablet (25 mg total) by mouth at bedtime as needed (sleep)., Disp: 90 tablet, Rfl: 0   levocetirizine (XYZAL ) 5 MG tablet, TAKE 1 TABLET BY MOUTH ONCE DAILY IN THE EVENING, Disp: 30 tablet, Rfl: 0   montelukast  (SINGULAIR ) 10 MG tablet, TAKE 1 TABLET BY MOUTH AT BEDTIME, Disp: 30 tablet, Rfl: 0   Multiple Vitamin (MULTIVITAMIN ADULT) TABS, Take 1 tablet by mouth daily with breakfast., Disp: , Rfl:     rizatriptan  (MAXALT ) 10 MG tablet, Take 1 tablet (10 mg total) by mouth once as needed for migraine. May repeat in 2 hours if needed, Disp: 30 tablet, Rfl: 2   rosuvastatin  (CRESTOR ) 10 MG tablet, Take 1 tablet (10 mg total) by mouth daily., Disp: 90 tablet, Rfl: 1   TRELEGY ELLIPTA  200-62.5-25 MCG/ACT AEPB, Inhale 1 Inhalation into the lungs daily., Disp: 1 each, Rfl: 5   Ubrogepant  (UBRELVY ) 100 MG TABS, Take one tablet onset of Migraine May take another tablet 2 hours later, Don't exceed two tablets daily, Disp: 16 tablet, Rfl: 11   varenicline  (CHANTIX ) 1 MG tablet, Take 1 mg by mouth daily., Disp: , Rfl:   Past Medical History: Past Medical History:  Diagnosis Date   Allergy     seasonal   Anxiety    Asthma    COPD (chronic obstructive pulmonary disease) (HCC)    Depression    Emphysema of lung (HCC)    PTSD (post-traumatic stress disorder)     Tobacco Use: Social History   Tobacco Use  Smoking Status Former   Current packs/day: 0.00   Average packs/day: 1 pack/day for 40.6 years (40.6 ttl pk-yrs)   Types: Cigarettes   Start date: 03/05/1981   Quit date: 10/14/2021   Years since quitting: 1.6  Smokeless Tobacco Never    Labs: Review Flowsheet  More data exists      Latest Ref Rng &  Units 03/24/2022 04/12/2022 11/16/2022 05/16/2023 05/23/2023  Labs for ITP Cardiac and Pulmonary Rehab  Cholestrol 0 - 200 mg/dL - 161  - 096  -  LDL (calc) 0 - 99 mg/dL - 045  - 409  -  HDL-C >39.00 mg/dL - 81.19  - 14.78  -  Trlycerides 0.0 - 149.0 mg/dL - 295.6  - 213.0  -  Hemoglobin A1c 4.6 - 6.5 % 6.0  - 5.9  - -  PH, Arterial 7.35 - 7.45 - - - - 7.49   PCO2 arterial 32 - 48 mmHg - - - - 32   Bicarbonate 20.0 - 28.0 mmol/L - - - - 24.4   O2 Saturation % - - - - 98.3     Capillary Blood Glucose: Lab Results  Component Value Date   GLUCAP 73 03/26/2022   GLUCAP 148 (H) 03/25/2022   GLUCAP 166 (H) 03/25/2022   GLUCAP 88 03/25/2022   GLUCAP 84 03/25/2022     Pulmonary Assessment  Scores:  UCSD: Self-administered rating of dyspnea associated with activities of daily living (ADLs) 6-point scale (0 = not at all to 5 = maximal or unable to do because of breathlessness)  Scoring Scores range from 0 to 120.  Minimally important difference is 5 units  CAT: CAT can identify the health impairment of COPD patients and is better correlated with disease progression.  CAT has a scoring range of zero to 40. The CAT score is classified into four groups of low (less than 10), medium (10 - 20), high (21-30) and very high (31-40) based on the impact level of disease on health status. A CAT score over 10 suggests significant symptoms.  A worsening CAT score could be explained by an exacerbation, poor medication adherence, poor inhaler technique, or progression of COPD or comorbid conditions.  CAT MCID is 2 points  mMRC: mMRC (Modified Medical Research Council) Dyspnea Scale is used to assess the degree of baseline functional disability in patients of respiratory disease due to dyspnea. No minimal important difference is established. A decrease in score of 1 point or greater is considered a positive change.   Pulmonary Function Assessment:   Exercise Target Goals: Exercise Program Goal: Individual exercise prescription set using results from initial 6 min walk test and THRR while considering  patient's activity barriers and safety.   Exercise Prescription Goal: Initial exercise prescription builds to 30-45 minutes a day of aerobic activity, 2-3 days per week.  Home exercise guidelines will be given to patient during program as part of exercise prescription that the participant will acknowledge.  Activity Barriers & Risk Stratification:   6 Minute Walk:   Oxygen  Initial Assessment:   Oxygen  Re-Evaluation:  Oxygen  Re-Evaluation     Row Name 12/19/22 0909 01/16/23 0934 02/13/23 1206 03/20/23 0906 04/12/23 1049     Program Oxygen  Prescription   Program Oxygen   Prescription Continuous Continuous Continuous Continuous Continuous   Liters per minute 2 2 2 2 2      Home Oxygen    Home Oxygen  Device Home Concentrator;Portable Concentrator;E-Tanks Home Concentrator;Portable Concentrator;E-Tanks Home Concentrator;Portable Concentrator;E-Tanks Home Concentrator;Portable Concentrator;E-Tanks Home Concentrator;Portable Concentrator;E-Tanks   Sleep Oxygen  Prescription Continuous Continuous Continuous Continuous Continuous   Liters per minute 2 2 2 2 2    Home Exercise Oxygen  Prescription Pulsed Pulsed Pulsed Pulsed Pulsed   Liters per minute 2 2 2 2 2    Home Resting Oxygen  Prescription None None None None None   Compliance with Home Oxygen  Use Yes Yes Yes Yes Yes  Goals/Expected Outcomes   Short Term Goals To learn and exhibit compliance with exercise, home and travel O2 prescription;To learn and understand importance of monitoring SPO2 with pulse oximeter and demonstrate accurate use of the pulse oximeter.;To learn and understand importance of maintaining oxygen  saturations>88%;To learn and demonstrate proper pursed lip breathing techniques or other breathing techniques. ;To learn and demonstrate proper use of respiratory medications To learn and exhibit compliance with exercise, home and travel O2 prescription;To learn and understand importance of monitoring SPO2 with pulse oximeter and demonstrate accurate use of the pulse oximeter.;To learn and understand importance of maintaining oxygen  saturations>88%;To learn and demonstrate proper pursed lip breathing techniques or other breathing techniques. ;To learn and demonstrate proper use of respiratory medications To learn and exhibit compliance with exercise, home and travel O2 prescription;To learn and understand importance of monitoring SPO2 with pulse oximeter and demonstrate accurate use of the pulse oximeter.;To learn and understand importance of maintaining oxygen  saturations>88%;To learn and demonstrate proper  pursed lip breathing techniques or other breathing techniques. ;To learn and demonstrate proper use of respiratory medications To learn and exhibit compliance with exercise, home and travel O2 prescription;To learn and understand importance of monitoring SPO2 with pulse oximeter and demonstrate accurate use of the pulse oximeter.;To learn and understand importance of maintaining oxygen  saturations>88%;To learn and demonstrate proper pursed lip breathing techniques or other breathing techniques. ;To learn and demonstrate proper use of respiratory medications To learn and exhibit compliance with exercise, home and travel O2 prescription;To learn and understand importance of monitoring SPO2 with pulse oximeter and demonstrate accurate use of the pulse oximeter.;To learn and understand importance of maintaining oxygen  saturations>88%;To learn and demonstrate proper pursed lip breathing techniques or other breathing techniques. ;To learn and demonstrate proper use of respiratory medications   Long  Term Goals Exhibits compliance with exercise, home  and travel O2 prescription;Maintenance of O2 saturations>88%;Compliance with respiratory medication;Verbalizes importance of monitoring SPO2 with pulse oximeter and return demonstration;Exhibits proper breathing techniques, such as pursed lip breathing or other method taught during program session;Demonstrates proper use of MDI's Exhibits compliance with exercise, home  and travel O2 prescription;Maintenance of O2 saturations>88%;Compliance with respiratory medication;Verbalizes importance of monitoring SPO2 with pulse oximeter and return demonstration;Exhibits proper breathing techniques, such as pursed lip breathing or other method taught during program session;Demonstrates proper use of MDI's Exhibits compliance with exercise, home  and travel O2 prescription;Maintenance of O2 saturations>88%;Compliance with respiratory medication;Verbalizes importance of monitoring SPO2  with pulse oximeter and return demonstration;Exhibits proper breathing techniques, such as pursed lip breathing or other method taught during program session;Demonstrates proper use of MDI's Exhibits compliance with exercise, home  and travel O2 prescription;Maintenance of O2 saturations>88%;Compliance with respiratory medication;Verbalizes importance of monitoring SPO2 with pulse oximeter and return demonstration;Exhibits proper breathing techniques, such as pursed lip breathing or other method taught during program session;Demonstrates proper use of MDI's Exhibits compliance with exercise, home  and travel O2 prescription;Maintenance of O2 saturations>88%;Compliance with respiratory medication;Verbalizes importance of monitoring SPO2 with pulse oximeter and return demonstration;Exhibits proper breathing techniques, such as pursed lip breathing or other method taught during program session;Demonstrates proper use of MDI's   Goals/Expected Outcomes Compliance and understanding of oxygen  saturations monitoring and breathing techniques to decrease shortness of breath. Compliance and understanding of oxygen  saturations monitoring and breathing techniques to decrease shortness of breath. Compliance and understanding of oxygen  saturations monitoring and breathing techniques to decrease shortness of breath. Compliance and understanding of oxygen  saturations monitoring and breathing techniques to decrease shortness of breath. Compliance  and understanding of oxygen  saturations monitoring and breathing techniques to decrease shortness of breath.    Row Name 05/12/23 7846 06/09/23 0926           Program Oxygen  Prescription   Program Oxygen  Prescription Continuous Continuous      Liters per minute 2 2        Home Oxygen    Home Oxygen  Device Home Concentrator;Portable Concentrator;E-Tanks Home Concentrator;Portable Concentrator;E-Tanks      Sleep Oxygen  Prescription Continuous Continuous      Liters per minute 2  2      Home Exercise Oxygen  Prescription Pulsed Pulsed      Liters per minute 2 2      Home Resting Oxygen  Prescription None None      Compliance with Home Oxygen  Use Yes Yes        Goals/Expected Outcomes   Short Term Goals To learn and exhibit compliance with exercise, home and travel O2 prescription;To learn and understand importance of monitoring SPO2 with pulse oximeter and demonstrate accurate use of the pulse oximeter.;To learn and understand importance of maintaining oxygen  saturations>88%;To learn and demonstrate proper pursed lip breathing techniques or other breathing techniques. ;To learn and demonstrate proper use of respiratory medications To learn and exhibit compliance with exercise, home and travel O2 prescription;To learn and understand importance of monitoring SPO2 with pulse oximeter and demonstrate accurate use of the pulse oximeter.;To learn and understand importance of maintaining oxygen  saturations>88%;To learn and demonstrate proper pursed lip breathing techniques or other breathing techniques. ;To learn and demonstrate proper use of respiratory medications      Long  Term Goals Exhibits compliance with exercise, home  and travel O2 prescription;Maintenance of O2 saturations>88%;Compliance with respiratory medication;Verbalizes importance of monitoring SPO2 with pulse oximeter and return demonstration;Exhibits proper breathing techniques, such as pursed lip breathing or other method taught during program session;Demonstrates proper use of MDI's Exhibits compliance with exercise, home  and travel O2 prescription;Maintenance of O2 saturations>88%;Compliance with respiratory medication;Verbalizes importance of monitoring SPO2 with pulse oximeter and return demonstration;Exhibits proper breathing techniques, such as pursed lip breathing or other method taught during program session;Demonstrates proper use of MDI's      Goals/Expected Outcomes Compliance and understanding of oxygen   saturations monitoring and breathing techniques to decrease shortness of breath. Compliance and understanding of oxygen  saturations monitoring and breathing techniques to decrease shortness of breath.               Oxygen  Discharge (Final Oxygen  Re-Evaluation):  Oxygen  Re-Evaluation - 06/09/23 0926       Program Oxygen  Prescription   Program Oxygen  Prescription Continuous    Liters per minute 2      Home Oxygen    Home Oxygen  Device Home Concentrator;Portable Concentrator;E-Tanks    Sleep Oxygen  Prescription Continuous    Liters per minute 2    Home Exercise Oxygen  Prescription Pulsed    Liters per minute 2    Home Resting Oxygen  Prescription None    Compliance with Home Oxygen  Use Yes      Goals/Expected Outcomes   Short Term Goals To learn and exhibit compliance with exercise, home and travel O2 prescription;To learn and understand importance of monitoring SPO2 with pulse oximeter and demonstrate accurate use of the pulse oximeter.;To learn and understand importance of maintaining oxygen  saturations>88%;To learn and demonstrate proper pursed lip breathing techniques or other breathing techniques. ;To learn and demonstrate proper use of respiratory medications    Long  Term Goals Exhibits compliance with exercise, home  and  travel O2 prescription;Maintenance of O2 saturations>88%;Compliance with respiratory medication;Verbalizes importance of monitoring SPO2 with pulse oximeter and return demonstration;Exhibits proper breathing techniques, such as pursed lip breathing or other method taught during program session;Demonstrates proper use of MDI's    Goals/Expected Outcomes Compliance and understanding of oxygen  saturations monitoring and breathing techniques to decrease shortness of breath.             Initial Exercise Prescription:   Perform Capillary Blood Glucose checks as needed.  Exercise Prescription Changes:   Exercise Prescription Changes     Row Name 12/22/22 1158  01/10/23 0900 01/24/23 1100 02/02/23 1158 02/21/23 1200     Response to Exercise   Blood Pressure (Admit) 128/96 96/66 100/60 104/60 100/60   Blood Pressure (Exercise) -- -- 118/64 -- 142/82   Blood Pressure (Exit) 108/62 94/68 96/58  94/68 92/62   Heart Rate (Admit) 72 bpm 73 bpm 65 bpm 75 bpm 76 bpm   Heart Rate (Exercise) 91 bpm 106 bpm 86 bpm 97 bpm 110 bpm   Heart Rate (Exit) 79 bpm 84 bpm 72 bpm 77 bpm 90 bpm   Oxygen  Saturation (Admit) 97 % 100 % 98 % 97 % 97 %   Oxygen  Saturation (Exercise) 98 % 92 % 95 % 95 % 92 %   Oxygen  Saturation (Exit) 97 % 98 % 98 % 98 % 97 %   Rating of Perceived Exertion (Exercise) 13 12.5 13 13  12.5   Perceived Dyspnea (Exercise) 2 2 1 2 2    Duration Continue with 30 min of aerobic exercise without signs/symptoms of physical distress. Continue with 30 min of aerobic exercise without signs/symptoms of physical distress. Continue with 30 min of aerobic exercise without signs/symptoms of physical distress. Continue with 30 min of aerobic exercise without signs/symptoms of physical distress. Continue with 30 min of aerobic exercise without signs/symptoms of physical distress.   Intensity THRR unchanged THRR unchanged THRR unchanged THRR unchanged THRR unchanged     Progression   Progression Continue to progress workloads to maintain intensity without signs/symptoms of physical distress. Continue to progress workloads to maintain intensity without signs/symptoms of physical distress. Continue to progress workloads to maintain intensity without signs/symptoms of physical distress. Continue to progress workloads to maintain intensity without signs/symptoms of physical distress. Continue to progress workloads to maintain intensity without signs/symptoms of physical distress.     Resistance Training   Training Prescription Yes Yes Yes Yes Yes   Weight blue bands blue bands blue bands blue bands blue bands   Reps 10-15 10-15 10-15 10-15 10-15   Time 10 Minutes 10  Minutes 10 Minutes 10 Minutes 10 Minutes     Interval Training   Interval Training Yes Yes -- Yes Yes   Equipment Treadmill Treadmill -- Treadmill Treadmill   Comments 22min@2mph &0%incline, 22min@mph &2%incline 67min@1 .50mph&0%incline, 41min@3mph &2%incline -- 29min@1 .35moh&0%incline, 9min1.8mph&3%incline --     Oxygen    Oxygen  Continuous Continuous Continuous Continuous Continuous   Liters 2 2 2 2 2      Treadmill   MPH -- 2 1.8 -- 2   Grade -- 3 0 -- 3   Minutes -- 15 15 -- 15   METs -- 4.12 2.2 -- 3.36     Recumbant Elliptical   Level 3 3 4 4 3    RPM -- -- 50 -- 58   Watts -- -- -- -- 78   Minutes 15 15 15 15 15    METs 2.4 2.7 2.9 3.5 3.6     Oxygen    Maintain Oxygen  Saturation 88%  or higher 88% or higher 88% or higher 88% or higher 88% or higher    Row Name 03/07/23 1100 03/21/23 1100 04/04/23 1100 04/11/23 1451 05/02/23 1200     Response to Exercise   Blood Pressure (Admit) 98/60 96/56 100/62 96/58 102/58   Blood Pressure (Exercise) 134/78 118/54 110/64 -- 110/64   Blood Pressure (Exit) 90/64 90/56 94/58  92/60 102/68   Heart Rate (Admit) 67 bpm 67 bpm 72 bpm 69 bpm 69 bpm   Heart Rate (Exercise) 95 bpm 99 bpm 94 bpm 126 bpm 121 bpm   Heart Rate (Exit) 97 bpm 82 bpm 78 bpm 96 bpm 91 bpm   Oxygen  Saturation (Admit) 98 % 96 % 97 % 99 % 97 %   Oxygen  Saturation (Exercise) 93 % 93 % 92 % 90 % 90 %   Oxygen  Saturation (Exit) 97 % 96 % 95 % 97 % 96 %   Rating of Perceived Exertion (Exercise) 13 13 13 13 12    Perceived Dyspnea (Exercise) 2 2 3 3 2    Duration Continue with 30 min of aerobic exercise without signs/symptoms of physical distress. Continue with 30 min of aerobic exercise without signs/symptoms of physical distress. Continue with 30 min of aerobic exercise without signs/symptoms of physical distress. Continue with 30 min of aerobic exercise without signs/symptoms of physical distress. Continue with 30 min of aerobic exercise without signs/symptoms of physical distress.    Intensity THRR unchanged THRR unchanged THRR unchanged THRR unchanged THRR unchanged     Progression   Progression Continue to progress workloads to maintain intensity without signs/symptoms of physical distress. Continue to progress workloads to maintain intensity without signs/symptoms of physical distress. Continue to progress workloads to maintain intensity without signs/symptoms of physical distress. Continue to progress workloads to maintain intensity without signs/symptoms of physical distress. Continue to progress workloads to maintain intensity without signs/symptoms of physical distress.     Resistance Training   Training Prescription Yes Yes Yes Yes Yes   Weight blue bands blue bands blue bands blue bands blue bands   Reps 10-15 10-15 10-15 10-15 10-15   Time 10 Minutes 10 Minutes 10 Minutes 10 Minutes 10 Minutes     Interval Training   Interval Training Yes Yes Yes Yes Yes   Equipment Treadmill Treadmill Treadmill Treadmill Treadmill   Comments 26min@2mph &3%incline METS3.2, 41min1.8mph&0%incline, METS2.2 76min@2 .63mph&3%incline METS 3.6, 55min@1 .102mph&0%incline METS 2.2 85min@2 .79mph&3%incline METS 3.6, 51min@1 .23mph&0%incline METS 2.2 21min@2 .30mph&3%incline METS 3.6, 57min@1 .108mph&0%incline METS 2.2 45min@2 .89mph&3%incline METS 3.6, 62min@1 .38mph&0%incline METS 2.2     Oxygen    Oxygen  Continuous Continuous Continuous Continuous Continuous   Liters 2 2 2 2 2      Treadmill   MPH 2 -- -- -- --   Grade 3 -- -- -- --   Minutes 15 -- -- -- --   METs 3.36 -- -- -- --     Recumbant Elliptical   Level 4 4 4 5 5    RPM -- -- 56 56 58   Watts -- -- 75 75 84   Minutes 15 15 15 15 15    METs 3.8 4 3.5 3.3 4     Oxygen    Maintain Oxygen  Saturation 88% or higher 88% or higher 88% or higher 88% or higher 88% or higher    Row Name 05/16/23 1100 05/25/23 1140 06/13/23 1100         Response to Exercise   Blood Pressure (Admit) 94/64 92/68 90/68      Blood Pressure (Exercise) 106/72 -- 120/70  Blood Pressure (Exit) 104/60 126/73 94/94     Heart Rate (Admit) 68 bpm 66 bpm 68 bpm     Heart Rate (Exercise) 108 bpm 106 bpm 90 bpm     Heart Rate (Exit) 78 bpm 79 bpm 70 bpm     Oxygen  Saturation (Admit) 99 %  2L 95 % 98 %     Oxygen  Saturation (Exercise) 93 %  2L 92 % 92 %     Oxygen  Saturation (Exit) 96 %  2L 97 % 96 %     Rating of Perceived Exertion (Exercise) 13 12.5 13     Perceived Dyspnea (Exercise) 2 2 2      Duration Continue with 30 min of aerobic exercise without signs/symptoms of physical distress. Continue with 30 min of aerobic exercise without signs/symptoms of physical distress. Continue with 30 min of aerobic exercise without signs/symptoms of physical distress.     Intensity THRR unchanged THRR unchanged THRR unchanged       Progression   Progression Continue to progress workloads to maintain intensity without signs/symptoms of physical distress. Continue to progress workloads to maintain intensity without signs/symptoms of physical distress. Continue to progress workloads to maintain intensity without signs/symptoms of physical distress.     Average METs -- 4.1 --       Resistance Training   Training Prescription Yes Yes Yes     Weight blue bands blue bands black bands     Reps 10-15 10-15 10-15     Time 10 Minutes 10 Minutes 10 Minutes       Interval Training   Interval Training Yes Yes Yes     Equipment Treadmill Treadmill Treadmill     Comments 23min@2 .40mph&3%incline METS 3.6, 73min@1 .28mph&0%incline METS 2.2 29min@2 .73mph&3%incline METS 3.6, 84min@1 .49mph&0%incline METS 2.2 65min@2 .42mph&3%incline METS 3.6, 18min@1 .24mph&0%incline METS 2.2       Oxygen    Oxygen  Continuous Continuous Continuous     Liters 2 2 2        Treadmill   MPH -- -- 2.3     Grade -- -- 3     Minutes -- -- 15     METs -- -- 3.71       Recumbant Elliptical   Level 5 5 --     Minutes 15 15 --     METs 4.2 4.1 --       Oxygen    Maintain Oxygen  Saturation 88% or higher 88% or higher 88% or  higher              Exercise Comments:   Exercise Goals and Review:   Exercise Goals Re-Evaluation :  Exercise Goals Re-Evaluation     Row Name 12/19/22 0906 01/16/23 1324 02/13/23 1202 03/20/23 0903 04/12/23 1047     Exercise Goal Re-Evaluation   Exercise Goals Review Increase Physical Activity;Able to understand and use Dyspnea scale;Understanding of Exercise Prescription;Increase Strength and Stamina;Knowledge and understanding of Target Heart Rate Range (THRR);Able to understand and use rate of perceived exertion (RPE) scale Increase Physical Activity;Able to understand and use Dyspnea scale;Understanding of Exercise Prescription;Increase Strength and Stamina;Knowledge and understanding of Target Heart Rate Range (THRR);Able to understand and use rate of perceived exertion (RPE) scale Increase Physical Activity;Able to understand and use Dyspnea scale;Understanding of Exercise Prescription;Increase Strength and Stamina;Knowledge and understanding of Target Heart Rate Range (THRR);Able to understand and use rate of perceived exertion (RPE) scale Increase Physical Activity;Able to understand and use Dyspnea scale;Understanding of Exercise Prescription;Increase Strength and Stamina;Knowledge and understanding of Target Heart Rate Range (  THRR);Able to understand and use rate of perceived exertion (RPE) scale Increase Physical Activity;Able to understand and use Dyspnea scale;Understanding of Exercise Prescription;Increase Strength and Stamina;Knowledge and understanding of Target Heart Rate Range (THRR);Able to understand and use rate of perceived exertion (RPE) scale   Comments Adaiah has completed 9 exercise sessions. She exercises for 15 min on the recumbent elliptical and treadmill. She averages 3 METs at level 2 on the recumbent elliptical and 2.2 METs on the treadmill. She performs the warmup and cooldown standing without limitations. Madesyn has progressed to interval training on the  treadmill. She walks at 1.8 mph for 2 min and 2 mph and 3% incline for 4 min. Ninetta tolerates interval walking so far. Will increased level on recumbent elliptical. Will continue to monitor and progress as able. Nyazia has completed 14 exercise sessions. She exercises for 15 min on the recumbent elliptical and treadmill. She averages 2.7 METs at level 3 on the recumbent elliptical and 3.2 METs on the treadmill. She performs the warmup and cooldown standing without limitations. She has increased her interval training. Low speed and incline is 3 mph with 2% incline and 2 mph with 1.5 incine. Samreen has not increased her recumbent elliptical level. Will encourage her to increase recumbent elliptical level. Will continue to monitor and progress as able. Ellieana has completed 20 exercise sessions. She exercises for 15 min on the recumbent elliptical and treadmill. She averages 3.5 METs at level 4 on the recumbent elliptical and 2.9 METs on the treadmill. She performs the warmup and cooldown standing without limitations. Julita has increased her level on the recumbent elliptical. She tolerates this progression wel. Her treadmill speed has also slightly increased. I asked Jourdan if she wanted to discuss home exercise. Adrean feels she has all the resources she needs to exercise at home. Will continue to monitor and progress as able. Rutha has completed 27 exercise sessions. She exercises for 15 min on the recumbent elliptical and treadmill. She averages 3.8 METs at level 4 on the recumbent elliptical and 3.6 METs on the treadmill. She performs the warmup and cooldown standing without limitations. Halie has increased her speed and incline on the treadmill as her intervals have changed. Shawne does intervals on certain days. Will continue to monitor and progress as able. Rosaura has completed 33 exercise sessions. She exercises for 15 min on the recumbent elliptical and treadmill. She averages 3.3 METs at level 5 on the  recumbent elliptical and 2.2-3.6 METs on the treadmill. She performs the warmup and cooldown standing without limitations. Ceirra has increased her level on the recumbent elliptical as METs remain relatively the same. Her speed has increased in her high interval. She continue to tolerate progressions well. Will continue to monitor and progress as able.   Expected Outcomes Through exercise at rehab and home, the patient will decrease shortness of breath with daily activities and feel confident in carrying out an exercise regimen at home. Through exercise at rehab and home, the patient will decrease shortness of breath with daily activities and feel confident in carrying out an exercise regimen at home. Through exercise at rehab and home, the patient will decrease shortness of breath with daily activities and feel confident in carrying out an exercise regimen at home. Through exercise at rehab and home, the patient will decrease shortness of breath with daily activities and feel confident in carrying out an exercise regimen at home. Through exercise at rehab and home, the patient will decrease shortness of breath with  daily activities and feel confident in carrying out an exercise regimen at home.    Row Name 05/12/23 5621 06/09/23 0924           Exercise Goal Re-Evaluation   Exercise Goals Review Increase Physical Activity;Able to understand and use Dyspnea scale;Understanding of Exercise Prescription;Increase Strength and Stamina;Knowledge and understanding of Target Heart Rate Range (THRR);Able to understand and use rate of perceived exertion (RPE) scale Increase Physical Activity;Able to understand and use Dyspnea scale;Understanding of Exercise Prescription;Increase Strength and Stamina;Knowledge and understanding of Target Heart Rate Range (THRR);Able to understand and use rate of perceived exertion (RPE) scale      Comments Makendra has completed 39 exercise sessions. She exercises for 15 min on the  recumbent elliptical and treadmill. She averages 3.9 METs at level 5 on the recumbent elliptical and 1.8-2.3 METs on the treadmill. She performs the warmup and cooldown standing without limitations. Lesli has increased her level on the recumbent elliptical as METs have increased. Her intervals on the treadmill have remained the relatively the same. Will continue to monitor and progress as able. Turquoise has completed 43 exercise sessions. She exercises for 15 min on the recumbent elliptical and treadmill. She averages 4.1 METs at level 5 on the recumbent elliptical and 1.8-2.3 METs on the treadmill. She performs the warmup and cooldown standing without limitations. Breonna has increased her level on the recumbent elliptical as METs have increased. She still tolerates progressions well. Layton has missed several sessions due to other appointments. Will continue to monitor and progress as able.      Expected Outcomes Through exercise at rehab and home, the patient will decrease shortness of breath with daily activities and feel confident in carrying out an exercise regimen at home. Through exercise at rehab and home, the patient will decrease shortness of breath with daily activities and feel confident in carrying out an exercise regimen at home.               Discharge Exercise Prescription (Final Exercise Prescription Changes):  Exercise Prescription Changes - 06/13/23 1100       Response to Exercise   Blood Pressure (Admit) 90/68    Blood Pressure (Exercise) 120/70    Blood Pressure (Exit) 94/94    Heart Rate (Admit) 68 bpm    Heart Rate (Exercise) 90 bpm    Heart Rate (Exit) 70 bpm    Oxygen  Saturation (Admit) 98 %    Oxygen  Saturation (Exercise) 92 %    Oxygen  Saturation (Exit) 96 %    Rating of Perceived Exertion (Exercise) 13    Perceived Dyspnea (Exercise) 2    Duration Continue with 30 min of aerobic exercise without signs/symptoms of physical distress.    Intensity THRR unchanged       Progression   Progression Continue to progress workloads to maintain intensity without signs/symptoms of physical distress.      Resistance Training   Training Prescription Yes    Weight black bands    Reps 10-15    Time 10 Minutes      Interval Training   Interval Training Yes    Equipment Treadmill    Comments 97min@2 .56mph&3%incline METS 3.6, 21min@1 .61mph&0%incline METS 2.2      Oxygen    Oxygen  Continuous    Liters 2      Treadmill   MPH 2.3    Grade 3    Minutes 15    METs 3.71      Oxygen    Maintain Oxygen  Saturation 88%  or higher             Nutrition:  Target Goals: Understanding of nutrition guidelines, daily intake of sodium 1500mg , cholesterol 200mg , calories 30% from fat and 7% or less from saturated fats, daily to have 5 or more servings of fruits and vegetables.  Biometrics:    Nutrition Therapy Plan and Nutrition Goals:  Nutrition Therapy & Goals - 06/08/23 1006       Nutrition Therapy   Diet General Healthy Diet      Personal Nutrition Goals   Nutrition Goal Patient to improve dietary quality by using the plate method as a daily guide for meal planning to include lean protein/plant protein, fruits, vegetables, whole grains, and nonfat/low fat dairy as part of well balanced diet   goal in progress.   Personal Goal #2 Patient to identify strategies for weight loss of 0.5-2.0# per week of weight loss.   goal in progress.   Comments Goals in progress.  Cherree has medical history of pulmonary HTN, COPD3, chronic respiratory failure. Per [facility] transplant RD documentation on 10/05/22, it is recommended that she lose weight to BMI 30/170# and ultimately BMI 27/150#. She reports following a high protein diet, tracking calories ~1400kcals/day. She has previously completed pulmonary rehab in February 2024; her starting weight at that time was 69.8kg/153.6#. She continues to work on weight loss by following a high protein/high fiber diet and tracking intake. She  does acknowledge some emotional eating/binge type eating tendencies; she has started seeing behavioral health counselor. Answered patient questions about constipation, fiber ,etc.  LDL is elevated and A1c is in a prediabetic range. Leoda would continue to benefit from weight loss and decrease intake of refined carbohydrates to support pulmonary disease.      Intervention Plan   Intervention Prescribe, educate and counsel regarding individualized specific dietary modifications aiming towards targeted core components such as weight, hypertension, lipid management, diabetes, heart failure and other comorbidities.;Nutrition handout(s) given to patient.    Expected Outcomes Short Term Goal: Understand basic principles of dietary content, such as calories, fat, sodium, cholesterol and nutrients.;Long Term Goal: Adherence to prescribed nutrition plan.             Nutrition Assessments:  MEDIFICTS Score Key: >=70 Need to make dietary changes  40-70 Heart Healthy Diet <= 40 Therapeutic Level Cholesterol Diet  Flowsheet Row PULMONARY REHAB CHRONIC OBSTRUCTIVE PULMONARY DISEASE from 02/22/2022 in [facility]  Picture Your Plate Total Score on Discharge 50      Picture Your Plate Scores: <16 Unhealthy dietary pattern with much room for improvement. 41-50 Dietary pattern unlikely to meet recommendations for good health and room for improvement. 51-60 More healthful dietary pattern, with some room for improvement.  >60 Healthy dietary pattern, although there may be some specific behaviors that could be improved.    Nutrition Goals Re-Evaluation:  Nutrition Goals Re-Evaluation     Row Name 03/13/23 1439 04/11/23 1129 05/09/23 1109 06/08/23 1006       Goals   Current Weight 178 lb 5.6 oz (80.9 kg) 176 lb 12.9 oz (80.2 kg) 174 lb 2.6 oz (79 kg) 173 lb 1 oz (78.5 kg)    Comment A1c 5.9; other most recent labs LDL 134, cholesterol 208 A1c  5.9; other most recent labs LDL 134, cholesterol 208 no new labs; most recent labs A1c 5.9,LDL 134, cholesterol 208 total cholesterol 222, LDL 139, A1c 5.9    Expected Outcome Goals in  progress. Weight loss goal not met. Channon has medical history of pulmonary HTN, COPD3, chronic respiratory failure. Per [facility] transplant RD documentation on 10/05/22, it is recommended that she lose weight to BMI 30/170# and ultimately BMI 27/150#. She does report history of over snacking on refined carbohydates, snacking in the middle of the night, etc. She has previously completed pulmonary rehab in February 2024; her starting weight at that time was 69.8kg/153.6#. She has maintained weight over the last ~5 months. She has previously used [employer]itness Pal app to aid with tracking calories. She has maintained her weight since starting with our program. She does acknowledge some emotional eating/binge type eating tendencies; she has started seeing behavioral health counselor. Monzerrath would continue to benefit from weight loss and decrease intake of refined carbohydrates to support pulmonary disease. Goals in progress. Weight loss goal not met; she has maintained her weight. Younique has medical history of pulmonary HTN, COPD3, chronic respiratory failure. Per [facility] transplant RD documentation on 10/05/22, it is recommended that she lose weight to BMI 30/170# and ultimately BMI 27/150#. She does report history of over snacking on refined carbohydates, snacking in the middle of the night, etc. She has previously completed pulmonary rehab in February 2024; her starting weight at that time was 69.8kg/153.6#. She has maintained weight over the last ~6 months. She has previously used [employer] app to aid with tracking calories. She has maintained her weight since starting with our program. She does acknowledge some emotional eating/binge type eating tendencies; she has started seeing behavioral health counselor. Alyssandra would continue to benefit  from weight loss and decrease intake of refined carbohydrates to support pulmonary disease. Goals in progress. Harveen has medical history of pulmonary HTN, COPD3, chronic respiratory failure. Per [facility] transplant RD documentation on 10/05/22, it is recommended that she lose weight to BMI 30/170# and ultimately BMI 27/150#. She reports following a high protein diet, tracking calories ~1400kcals/day. She has previously completed pulmonary rehab in February 2024; her starting weight at that time was 69.8kg/153.6#. She is down 3.7# since starting pulmonary rehab this time. She does acknowledge some emotional eating/binge type eating tendencies; she has started seeing behavioral health counselor. Answered patient questions about constipation, fiber ,etc. Maayan would continue to benefit from weight loss and decrease intake of refined carbohydrates to support pulmonary disease. Goals in progress. Aleanna has medical history of pulmonary HTN, COPD3, chronic respiratory failure. Per [facility] transplant RD documentation on 10/05/22, it is recommended that she lose weight to BMI 30/170# and ultimately BMI 27/150#. She reports following a high protein diet, tracking calories ~1400kcals/day. She has previously completed pulmonary rehab in February 2024; her starting weight at that time was 69.8kg/153.6#. She continues to work on weight loss by following a high protein/high fiber diet and tracking intake. She does acknowledge some emotional eating/binge type eating tendencies; she has started seeing behavioral health counselor. Answered patient questions about constipation, fiber ,etc. LDL is elevated and A1c is in a prediabetic range. Yanel would continue to benefit from weight loss and decrease intake of refined carbohydrates to support pulmonary disease.             Nutrition Goals Discharge (Final Nutrition Goals Re-Evaluation):  Nutrition Goals Re-Evaluation - 06/08/23 1006       Goals   Current Weight 173 lb 1 oz  (78.5 kg)    Comment total cholesterol 222, LDL 139, A1c 5.9    Expected Outcome Goals in progress. Kameran has medical history of pulmonary HTN, COPD3, chronic respiratory  failure. Per [facility] transplant RD documentation on 10/05/22, it is recommended that she lose weight to BMI 30/170# and ultimately BMI 27/150#. She reports following a high protein diet, tracking calories ~1400kcals/day. She has previously completed pulmonary rehab in February 2024; her starting weight at that time was 69.8kg/153.6#. She continues to work on weight loss by following a high protein/high fiber diet and tracking intake. She does acknowledge some emotional eating/binge type eating tendencies; she has started seeing behavioral health counselor. Answered patient questions about constipation, fiber ,etc. LDL is elevated and A1c is in a prediabetic range. Roman would continue to benefit from weight loss and decrease intake of refined carbohydrates to support pulmonary disease.             Psychosocial: Target Goals: Acknowledge presence or absence of significant depression and/or stress, maximize coping skills, provide positive support system. Participant is able to verbalize types and ability to use techniques and skills needed for reducing stress and depression.  Initial Review & Psychosocial Screening:   Quality of Life Scores:  Scores of 19 and below usually indicate a poorer quality of life in these areas.  A difference of  2-3 points is a clinically meaningful difference.  A difference of 2-3 points in the total score of the Quality of Life Index has been associated with significant improvement in overall quality of life, self-image, physical symptoms, and general health in studies assessing change in quality of life.  PHQ-9: Review Flowsheet  More data exists      06/07/2023 03/29/2023 03/16/2023 03/01/2023 02/15/2023  Depression screen PHQ 2/9  Decreased Interest       Down, Depressed, Hopeless       PHQ - 2  Score       Altered sleeping       Tired, decreased energy       Change in appetite       Feeling bad or failure about yourself        Trouble concentrating       Moving slowly or fidgety/restless       Suicidal thoughts       PHQ-9 Score       Difficult doing work/chores         Details       Information is confidential and restricted. Go to Review Flowsheets to unlock data.        Interpretation of Total Score  Total Score Depression Severity:  1-4 = Minimal depression, 5-9 = Mild depression, 10-14 = Moderate depression, 15-19 = Moderately severe depression, 20-27 = Severe depression   Psychosocial Evaluation and Intervention:   Psychosocial Re-Evaluation:  Psychosocial Re-Evaluation     Row Name 12/16/22 1308 01/18/23 1535 02/15/23 1034 03/15/23 1118 04/10/23 1429     Psychosocial Re-Evaluation   Current issues with Current Depression;History of Depression;Current Psychotropic Meds;Current Anxiety/Panic;Current Stress Concerns Current Depression;History of Depression;Current Psychotropic Meds;Current Anxiety/Panic;Current Stress Concerns Current Depression;History of Depression;Current Psychotropic Meds;Current Anxiety/Panic;Current Stress Concerns Current Depression;History of Depression;Current Psychotropic Meds;Current Stress Concerns Current Depression;History of Depression;Current Psychotropic Meds;Current Stress Concerns   Comments Braylinn denies any new psychosocial barriers or concerns. She has started taking psychotropic meds to help with her anxiety and depression. She is also working with a therapist to help her navigate her feelings of worry/anger. Rayden is currently struggling with making the decision to go through with an endobronchial valve replacement. She states her husband does not want her to due to the risk associated with the procedure. This decision she has  to make has increased her stress level. She is still working with her mental health therapist and  taking her psychotropic meds. She states she feels her depression is a little better. Danyel has made the decision not to go through with the endobronchial valve replacement. She felt the risk outweighed the benefits. She has peace about this decision. She is going to go through with the lung transplant evaluation which she is hopeful about. She feels her depression is stable at this time. She is compliant with taking her psychotropic meds and meeting with her mental health specialist.  No new barriers or concerns at this time. Fallon denies any new psychosocial barriers or concerns at this time. She is still working with her therapist and taking her psychotropic meds. Dreonna denies any new psychosocial barriers or concerns at this time. She is still working with her therapist and taking her psychotropic meds.   Expected Outcomes For Aronda to attend the program with less psychosocial issues or concerns. For Tali to attend the program with less psychosocial issues or concerns. For Shirrell to attend the program with less psychosocial issues or concerns. For Carlisha to attend the program with less psychosocial issues or concerns. For Elva to attend the program with less psychosocial issues or concerns.   Interventions Encouraged to attend Pulmonary Rehabilitation for the exercise;Stress management education Encouraged to attend Pulmonary Rehabilitation for the exercise;Stress management education Encouraged to attend Pulmonary Rehabilitation for the exercise Encouraged to attend Pulmonary Rehabilitation for the exercise Encouraged to attend Pulmonary Rehabilitation for the exercise   Continue Psychosocial Services  Follow up required by staff Follow up required by staff Follow up required by staff No Follow up required No Follow up required    Row Name 05/10/23 0845 06/09/23 1610           Psychosocial Re-Evaluation   Current issues with Current Depression;History of Depression;Current Psychotropic  Meds;Current Stress Concerns Current Depression;History of Depression;Current Psychotropic Meds;Current Stress Concerns      Comments Orabelle denies any new psychosocial barriers or concerns at this time. She has made some friends in class and this has seem to brighten her spirits. She also has been losing weight which has helped her mental health. She is compliant with taking her psychotropic meds and is still working with her mental health specialist. Mry is currently dealing with new stress and anxiety over recent heart issues. She is still working with her therapist and is compliant with taking her psychotropic meds. She denies any needs at this time.      Expected Outcomes For Ivelise to attend the program with less psychosocial issues or concerns. For Marleigh to attend the program with less psychosocial issues or concerns.      Interventions Encouraged to attend Pulmonary Rehabilitation for the exercise --      Continue Psychosocial Services  No Follow up required No Follow up required               Psychosocial Discharge (Final Psychosocial Re-Evaluation):  Psychosocial Re-Evaluation - 06/09/23 9604       Psychosocial Re-Evaluation   Current issues with Current Depression;History of Depression;Current Psychotropic Meds;Current Stress Concerns    Comments Reisha is currently dealing with new stress and anxiety over recent heart issues. She is still working with her therapist and is compliant with taking her psychotropic meds. She denies any needs at this time.    Expected Outcomes For Nikala to attend the program with less psychosocial issues or concerns.  Continue Psychosocial Services  No Follow up required             Education: Education Goals: Education classes will be provided on a weekly basis, covering required topics. Participant will state understanding/return demonstration of topics presented.  Learning Barriers/Preferences:   Education Topics: Know Your  Numbers Group instruction that is supported by a PowerPoint presentation. Instructor discusses importance of knowing and understanding resting, exercise, and post-exercise oxygen  saturation, heart rate, and blood pressure. Oxygen  saturation, heart rate, blood pressure, rating of perceived exertion, and dyspnea are reviewed along with a normal range for these values.  Flowsheet Row PULMONARY REHAB CHRONIC OBSTRUCTIVE PULMONARY DISEASE from 01/05/2023 in [facility]  Date 01/05/23  Educator EP  Instruction Review Code 1- Verbalizes Understanding       Exercise for the Pulmonary Patient Group instruction that is supported by a PowerPoint presentation. Instructor discusses benefits of exercise, core components of exercise, frequency, duration, and intensity of an exercise routine, importance of utilizing pulse oximetry during exercise, safety while exercising, and options of places to exercise outside of rehab.  Flowsheet Row PULMONARY REHAB CHRONIC OBSTRUCTIVE PULMONARY DISEASE from 03/30/2023 in [facility]  Date 03/30/23  Educator EP  Instruction Review Code 1- Verbalizes Understanding       MET Level  Group instruction provided by PowerPoint, verbal discussion, and written material to support subject matter. Instructor reviews what METs are and how to increase METs.  Flowsheet Row PULMONARY REHAB CHRONIC OBSTRUCTIVE PULMONARY DISEASE from 05/25/2023 in [facility]  Date 05/25/23  Educator EP  Instruction Review Code 1- Verbalizes Understanding       Pulmonary Medications Verbally interactive group education provided by instructor with focus on inhaled medications and proper administration. Flowsheet Row PULMONARY REHAB CHRONIC OBSTRUCTIVE PULMONARY DISEASE from 12/22/2022 in [facility] for  Heart, Vascular, & Lung Health  Date 12/22/22  Educator RT  Instruction Review Code 1- Verbalizes Understanding       Anatomy and Physiology of the Respiratory System Group instruction provided by PowerPoint, verbal discussion, and written material to support subject matter. Instructor reviews respiratory cycle and anatomical components of the respiratory system and their functions. Instructor also reviews differences in obstructive and restrictive respiratory diseases with examples of each.  Flowsheet Row PULMONARY REHAB CHRONIC OBSTRUCTIVE PULMONARY DISEASE from 12/15/2022 in [facility] for Heart, Vascular, & Lung Health  Date 12/15/22  Educator RT  Instruction Review Code 1- Verbalizes Understanding       Oxygen  Safety Group instruction provided by PowerPoint, verbal discussion, and written material to support subject matter. There is an overview of "What is Oxygen " and "Why do we need it".  Instructor also reviews how to create a safe environment for oxygen  use, the importance of using oxygen  as prescribed, and the risks of noncompliance. There is a brief discussion on traveling with oxygen  and resources the patient may utilize. Flowsheet Row PULMONARY REHAB CHRONIC OBSTRUCTIVE PULMONARY DISEASE from 01/27/2022 in [facility] for Heart, Vascular, & Lung Health  Date 01/27/22  Educator RN  Instruction Review Code 1- Verbalizes Understanding       Oxygen  Use Group instruction provided by PowerPoint, verbal discussion, and written material to discuss how supplemental oxygen  is prescribed and different types of oxygen  supply systems. Resources for more information are provided.  Flowsheet Row PULMONARY REHAB CHRONIC OBSTRUCTIVE PULMONARY DISEASE from 01/19/2023 in [facility] for Heart, Vascular, & Lung Health  Date 01/19/23  Educator RT  Instruction Review Code 1- Verbalizes Understanding       Breathing  Techniques Group instruction that is supported by demonstration and informational handouts. Instructor discusses the benefits of pursed lip and diaphragmatic breathing and detailed demonstration on how to perform both.  Flowsheet Row PULMONARY REHAB CHRONIC OBSTRUCTIVE PULMONARY DISEASE from 04/27/2023 in [facility] for Heart, Vascular, & Lung Health  Date 04/27/23  Educator RN  Instruction Review Code 1- Verbalizes Understanding        Risk Factor Reduction Group instruction that is supported by a PowerPoint presentation. Instructor discusses the definition of a risk factor, different risk factors for pulmonary disease, and how the heart and lungs work together. Flowsheet Row PULMONARY REHAB CHRONIC OBSTRUCTIVE PULMONARY DISEASE from 05/18/2023 in [facility] for Heart, Vascular, & Lung Health  Date 05/18/23  Educator EP  Instruction Review Code 1- Verbalizes Understanding       Pulmonary Diseases Group instruction provided by PowerPoint, verbal discussion, and written material to support subject matter. Instructor gives an overview of the different type of pulmonary diseases. There is also a discussion on risk factors and symptoms as well as ways to manage the diseases. Flowsheet Row PULMONARY REHAB CHRONIC OBSTRUCTIVE PULMONARY DISEASE from 03/23/2023 in [facility] for Heart, Vascular, & Lung Health  Date 03/23/23  Educator RT  Instruction Review Code 1- Verbalizes Understanding       Stress and Energy Conservation Group instruction provided by PowerPoint, verbal discussion, and written material to support subject matter. Instructor gives an overview of stress and the impact it can have on the body. Instructor also reviews ways to reduce stress. There is also a discussion on energy conservation and ways to conserve energy throughout the day. Flowsheet Row PULMONARY REHAB CHRONIC OBSTRUCTIVE PULMONARY DISEASE  from 05/04/2023 in [facility] for Heart, Vascular, & Lung Health  Date 05/04/23  Educator RN  Instruction Review Code 1- Verbalizes Understanding       Warning Signs and Symptoms Group instruction provided by PowerPoint, verbal discussion, and written material to support subject matter. Instructor reviews warning signs and symptoms of stroke, heart attack, cold and flu. Instructor also reviews ways to prevent the spread of infection. Flowsheet Row PULMONARY REHAB CHRONIC OBSTRUCTIVE PULMONARY DISEASE from 05/11/2023 in [facility] for Heart, Vascular, & Lung Health  Date 05/11/23  Educator RN  Instruction Review Code 1- Verbalizes Understanding       Other Education Group or individual verbal, written, or video instructions that support the educational goals of the pulmonary rehab program. Flowsheet Row PULMONARY REHAB CHRONIC OBSTRUCTIVE PULMONARY DISEASE from 03/16/2023 in [facility] for Heart, Vascular, & Lung Health  Date 03/16/23  Educator EP  Instruction Review Code 1- Verbalizes Understanding        Knowledge Questionnaire Score:   Core Components/Risk Factors/Patient Goals at Admission:   Core Components/Risk Factors/Patient Goals Review:   Goals and Risk Factor Review     Row Name 12/16/22 0928 01/18/23 1539 02/15/23 1037 03/15/23 1120 04/10/23 1430     Core Components/Risk Factors/Patient Goals Review   Personal Goals Review Weight Management/Obesity;Improve shortness of breath with ADL's;Develop more efficient breathing techniques such as purse lipped breathing and diaphragmatic breathing and practicing self-pacing with activity.;Stress Weight Management/Obesity;Improve shortness  of breath with ADL's Weight Management/Obesity;Improve shortness of breath with ADL's;Stress Weight Management/Obesity;Improve shortness of breath with ADL's;Stress Weight Management/Obesity;Improve shortness of breath  with ADL's   Review Goal progressing for weight loss. Goal progressing on improving shortness of breath with ADL's. Goal progressing on developing more efficient breathing techniques such as purse lipped breathing and diaphragmatic breathing; and practicing self-pacing with activity. Goal met on increasing knowledge of respiratory medications and ability to use respiratory devices properly. Konner has demonstrated proper use of MDI to staff.  Goal progressing on decreasing stress. Marilou is currently working with a  therapist on her mental health issues. She has also started taking psychotropic meds. Santa is requiring 2L of O2 to maintain sats >88% while exercising. We will continue to monitor Ashling's progress throughout the program. Goal progressing for weight loss. Goal progressing on improving shortness of breath with ADL's. Goal met on developing more efficient breathing techniques such as purse lipped breathing and diaphragmatic breathing; and practicing self-pacing with activity. Minnetta is able to demonstrate purse lip breathing when she gets short of breath. Marija is also able to pace herself based on her rate of perceived exertion scale. Goal progressing on decreasing stress.  Meggen is requiring 2L of O2 to maintain sats >88% while exercising. We will continue to monitor Riki's progress throughout the program. Goal progressing for weight loss. Goal progressing on improving shortness of breath with ADL's. Goal progressing on decreasing stress.  Bentlee is requiring 2L of O2 to maintain sats >88% while exercising. She is currently exercising on the recumbant elliptical and the treadmill. We will continue to monitor Travis's progress throughout the program. Goal progressing for weight loss. Vivienne is working with the staff dietician to obatain her weight loss goals. Goal progressing on improving shortness of breath with ADL's. Goal met on decreasing stress. She states that exercising has helped reduce  her stress and she feels like her stress level is under control. Stanley is requiring 2L of O2 to maintain sats >88% while exercising. She is currently exercising on the recumbant elliptical and the treadmill. We will continue to monitor Almetta's progress throughout the program. Monthly review of patient's Core Components/Risk Factors/Patient Goals are as follows: Goal progressing for weight loss. Nitika is working with the staff dietitian to obatain her weight loss goals. Goal progressing on improving shortness of breath with ADL's. Briyonna is requiring 2L of O2 to maintain sats >88% while exercising. She is currently exercising on the recumbant elliptical and the treadmill. We will continue to monitor Keairra's progress throughout the program.   Expected Outcomes For Tameia to lose weight, improve her SOB with ADLs, decrease stress, and develop more efficient breathing techniques such as purse lipped breathing and diaphragmatic breathing; and practicing self-pacing with activity For Masako to lose weight, improve her SOB with ADLs and decrease stress For Marielena to lose weight, improve her SOB with ADLs and decrease stress For Evolett to lose weight and improve her SOB with ADLs For Linsay to lose weight and improve her SOB with ADLs    Row Name 05/10/23 1610 06/09/23 0904           Core Components/Risk Factors/Patient Goals Review   Personal Goals Review Weight Management/Obesity;Improve shortness of breath with ADL's Weight Management/Obesity;Improve shortness of breath with ADL's      Review Monthly review of patient's Core Components/Risk Factors/Patient Goals are as follows: Goal progressing for weight loss. Philamena has been applying the education she has receved from the dietitician as well as  using weight watchers to help facilitate weight loss. Goal progressing on improving shortness of breath with ADL's. Albana is requiring 2L of O2 to maintain sats >88% while exercising. She is currently exercising  on the recumbant elliptical and the treadmill. We will continue to monitor Greenley's progress throughout the program. Monthly review of patient's Core Components/Risk Factors/Patient Goals are as follows: Goal progressing for weight loss. Collette has been applying the education she has received from the dietitician as well as using weight watchers to help facilitate weight loss. Goal progressing on improving shortness of breath with ADL's. Dava is requiring 2L of O2 to maintain sats >88% while exercising. She is currently exercising on the recumbant elliptical and the treadmill. We will continue to monitor Jahlia's progress throughout the program.      Expected Outcomes For Alexx to lose weight and improve her SOB with ADLs For Amayiah to lose weight and improve her SOB with ADLs               Core Components/Risk Factors/Patient Goals at Discharge (Final Review):   Goals and Risk Factor Review - 06/09/23 0904       Core Components/Risk Factors/Patient Goals Review   Personal Goals Review Weight Management/Obesity;Improve shortness of breath with ADL's    Review Monthly review of patient's Core Components/Risk Factors/Patient Goals are as follows: Goal progressing for weight loss. Deshonna has been applying the education she has received from the dietitician as well as using weight watchers to help facilitate weight loss. Goal progressing on improving shortness of breath with ADL's. Atia is requiring 2L of O2 to maintain sats >88% while exercising. She is currently exercising on the recumbant elliptical and the treadmill. We will continue to monitor Ericia's progress throughout the program.    Expected Outcomes For Mikah to lose weight and improve her SOB with ADLs             ITP Comments:Pt is making expected progress toward Pulmonary Rehab goals after completing 44 session(s). Recommend continued exercise, life style modification, education, and utilization of breathing techniques to  increase stamina and strength, while also decreasing shortness of breath with exertion.  Dr. Genetta Kenning is Medical Director for Pulmonary Rehab at [facility].

## 2023-06-15 ENCOUNTER — Encounter (HOSPITAL_COMMUNITY)
Admission: RE | Admit: 2023-06-15 | Discharge: 2023-06-15 | Disposition: A | Discharge status: Home / Self Care | Source: Ambulatory Visit | Attending: Internal Medicine | Admitting: Internal Medicine

## 2023-06-15 DIAGNOSIS — J449 Chronic obstructive pulmonary disease, unspecified: Secondary | ICD-10-CM

## 2023-06-15 NOTE — Progress Notes (Signed)
 Daily Session Note  Patient Details  Name: Jill Glenn MRN: [id number] Date of Birth: 06/26/1965 Referring Provider:   Gattis Kass Pulmonary Rehab Walk Test from 10/26/2022 in [facility] for Heart, Vascular, & Lung Health  Referring Provider Dione Franks    Encounter Date: 06/15/2023  Check In:  Session Check In - 06/15/23 1028       Check-In   Supervising physician immediately available to respond to emergencies [facility] MD immediately available    Physician(s) Slater Duncan, NP    Location [facility]    Staff Present Cindra Cree, RT;Randi Regis Captain BS, ACSM-CEP, Exercise Physiologist;Mary Arlester Ladd, RN, Shasta Deist, MS, ACSM-CEP, Exercise Physiologist;Samantha Belarus, RD, LDN    Virtual Visit No    Medication changes reported     No    Fall or balance concerns reported    No    Tobacco Cessation No Change    Warm-up and Cool-down Performed as group-led instruction    Resistance Training Performed Yes    VAD Patient? No    PAD/SET Patient? No      Pain Assessment   Currently in Pain? No/denies          Capillary Blood Glucose: No results found for this or any previous visit (from the past 24 hours).    Social History   Tobacco Use  Smoking Status Former   Current packs/day: 0.00   Average packs/day: 1 pack/day for 40.6 years (40.6 ttl pk-yrs)   Types: Cigarettes   Start date: 03/05/1981   Quit date: 10/14/2021   Years since quitting: 1.6  Smokeless Tobacco Never    Goals Met:  Proper associated with RPD/PD & O2 Sat Independence with exercise equipment Exercise tolerated well No report of concerns or symptoms today Strength training completed today  Goals Unmet:  Not Applicable  Comments: Service time is from 1004 to 1135.  Dr. Genetta Kenning is Medical Director for Pulmonary Rehab at [facility].

## 2023-06-17 ENCOUNTER — Other Ambulatory Visit: Payer: Self-pay | Admitting: Internal Medicine

## 2023-06-17 DIAGNOSIS — J301 Allergic rhinitis due to pollen: Secondary | ICD-10-CM

## 2023-06-20 ENCOUNTER — Encounter (HOSPITAL_COMMUNITY)
Admission: RE | Admit: 2023-06-20 | Discharge: 2023-06-20 | Disposition: A | Discharge status: Home / Self Care | Source: Ambulatory Visit | Attending: Internal Medicine | Admitting: Internal Medicine

## 2023-06-20 DIAGNOSIS — J449 Chronic obstructive pulmonary disease, unspecified: Secondary | ICD-10-CM

## 2023-06-20 NOTE — Progress Notes (Signed)
 Daily Session Note  Patient Details  Name: Jill Glenn MRN: [id number] Date of Birth: [birth year] Referring Provider:   Gattis Kass Pulmonary Rehab Walk Test from 10/26/2022 in [facility] for Heart, Vascular, & Lung Health  Referring Provider Dione Franks    Encounter Date: 06/20/2023  Check In:  Session Check In - 06/20/23 1124       Check-In   Supervising physician immediately available to respond to emergencies [facility] MD immediately available    Physician(s) Levin Reamer, NP    Location [facility]    Staff Present Cindra Cree, RT;Sahira Cataldi Regis Captain BS, ACSM-CEP, Exercise Physiologist;Mary Arlester Ladd, RN, Shasta Deist, MS, ACSM-CEP, Exercise Physiologist;Samantha Belarus, RD, LDN    Virtual Visit No    Medication changes reported     No    Fall or balance concerns reported    No    Tobacco Cessation No Change    Warm-up and Cool-down Performed as group-led instruction    Resistance Training Performed Yes    VAD Patient? No    PAD/SET Patient? No      Pain Assessment   Currently in Pain? No/denies    Multiple Pain Sites No          Capillary Blood Glucose: No results found for this or any previous visit (from the past 24 hours).    Social History   Tobacco Use  Smoking Status Former   Current packs/day: 0.00   Average packs/day: 1 pack/day for 40.6 years (40.6 ttl pk-yrs)   Types: Cigarettes   Start date: 03/05/1981   Quit date: 10/14/2021   Years since quitting: 1.6  Smokeless Tobacco Never    Goals Met:  Independence with exercise equipment Exercise tolerated well No report of concerns or symptoms today Strength training completed today  Goals Unmet:  Not Applicable  Comments: Service time is from 1010 to 1140.    Dr. Genetta Kenning is Medical Director for Pulmonary Rehab at [facility].

## 2023-06-22 ENCOUNTER — Ambulatory Visit (INDEPENDENT_AMBULATORY_CARE_PROVIDER_SITE_OTHER): Admitting: Licensed Clinical Social Worker

## 2023-06-22 ENCOUNTER — Encounter (HOSPITAL_COMMUNITY)
Admission: RE | Admit: 2023-06-22 | Discharge: 2023-06-22 | Disposition: A | Discharge status: Home / Self Care | Source: Ambulatory Visit | Attending: Internal Medicine | Admitting: Internal Medicine

## 2023-06-22 DIAGNOSIS — J449 Chronic obstructive pulmonary disease, unspecified: Secondary | ICD-10-CM | POA: Diagnosis not present

## 2023-06-22 DIAGNOSIS — F4310 Post-traumatic stress disorder, unspecified: Secondary | ICD-10-CM | POA: Diagnosis not present

## 2023-06-22 DIAGNOSIS — F331 Major depressive disorder, recurrent, moderate: Secondary | ICD-10-CM

## 2023-06-22 DIAGNOSIS — F411 Generalized anxiety disorder: Secondary | ICD-10-CM | POA: Diagnosis not present

## 2023-06-22 NOTE — Progress Notes (Unsigned)
 THERAPIST PROGRESS NOTE   Session Date: 06/22/2023  Session Time: 1410 - 1511  Participation Level: Active  Behavioral Response: CasualAlertAnxious and Depressed  Type of Therapy: Individual Therapy  Treatment Goals addressed:   Progressing (3) LTG: Reduce frequency, intensity, and duration of depression symptoms so that daily functioning is improved (OP Depression) LTG: Increase coping skills to manage depression and improve ability to perform daily activities (OP Depression) LTG: I just want to be happy (OP Depression)  Not Progressing (1) LTG: Work on communicating my needs better (Anxiety)  ProgressTowards Goals: Progressing  Interventions: CBT, Motivational Interviewing, and Supportive  Summary: Jill Glenn is a 58 y.o. female with past psych history of MDD, GAD, and PTSD, presenting for follow-up therapy session in efforts to improve management of depressive and anxious symptoms.   Patient actively engaged in session, presenting in mildly anxious and depressed moods, and congruent affect throughout duration of visit.  Patient openly engaged in introductory check-in, sharing of things having been better recently, with having resumed taking Buspar  medication as prescribed in addition to Celexa  and Hydroxyzine , noticing improved management of anxious sxs, finding things not to be as irritating during the day, further sharing of individual efforts to manage stress and sxs by finding self to be shifting focus onto things to not be so negative such as going outside, washing dishes, walking around the house, or watching TV by self.  Engaged in reflection of prior visit and patient's expressed interest and returning to church, however proving not to have done so due to increased respiratory concerns and difficulties breathing with greater heat and humidity, further proving to prevent patient from exploring potentially going to local neighborhood park.  Patient additionally noted awareness of  the ineffectiveness of avoidant behaviors, however attempting to justify avoidance as being an effective manner of dealing with stressors.  Patient responded well to interventions. Patient continues to meet criteria for MDD, GAD, and PTSD. Patient will continue to benefit from engagement in outpatient therapy due to being the least restrictive service to meet presenting needs.      06/22/2023    2:23 PM 06/07/2023    1:50 PM 03/29/2023   11:14 AM 03/16/2023    1:14 PM 03/01/2023    1:08 PM  Depression screen PHQ 2/9  Decreased Interest 1 1 0 1 3  Down, Depressed, Hopeless 1 1 1 1 3   PHQ - 2 Score 2 2 1 2 6   Altered sleeping 3 1 0 2 1  Tired, decreased energy 3 1 0 2 3  Change in appetite 0 0 0 2 0  Feeling bad or failure about yourself  2 1 1 1 1   Trouble concentrating 1 1 1 2  0  Moving slowly or fidgety/restless 0 0 0 0 0  Suicidal thoughts 0 0 0 0 0  PHQ-9 Score 11 6 3 11 11   Difficult doing work/chores Somewhat difficult Somewhat difficult Not difficult at all Somewhat difficult Somewhat difficult      06/22/2023    2:15 PM 06/07/2023    1:46 PM 03/29/2023   11:11 AM 03/16/2023    1:12 PM  GAD 7 : Generalized Anxiety Score  Nervous, Anxious, on Edge 1 2 0 1  Control/stop worrying 3 3 0 1  Worry too much - different things 3 3 0 1  Trouble relaxing 2 2 0 1  Restless 3 3 1 3   Easily annoyed or irritable 2 1 0 2  Afraid - awful might happen 0 0 0 0  Total GAD 7 Score 14 14 1 9   Anxiety Difficulty Somewhat difficult Extremely difficult Not difficult at all Somewhat difficult     Suicidal/Homicidal: Nowithout intent/plan  Therapist Response: Clinician utilized CBT, MI, and solution focused interventions to address pt's identified presenting problems and depressive and anxious sxs.   Actively greeted patient upon presenting for today's session, engaging patient in introductory check-in, assessing presenting moods and affect, and further eliciting patient's reflection of the events  of the past 2 weeks, and factors contributing to presenting moods.  Actively listened to patient's recounts of the events, providing support and validation of expressed thoughts and feelings, engaging patient in challenging of irrational thoughts, and reframing of perspectives.  Utilized Socratic questioning to support patient in greater critical processing of irrational thoughts and feelings surrounding presenting stresses and individual efforts at navigating such.  Actively engage patient in reassessing presenting depressive and anxious symptoms via PHQ-9 and GAD-7, further eliciting patient's thoughts and feelings in relation to maintained anxiousness and increased depressive symptoms.  Clinician reassessed severity of depressive and anxious sxs, and presence of any safety concerns. Therapist provided support and empathy to patient during session.  Plan: Return again in 2 week.  Diagnosis:  Encounter Diagnoses  Name Primary?   Generalized anxiety disorder Yes   MDD (major depressive disorder), recurrent episode, moderate (HCC)    PTSD (post-traumatic stress disorder)     Collaboration of Care: Psychiatrist AEB provider notes available in EHR.  Patient/Guardian was advised Release of Information must be obtained prior to any record release in order to collaborate their care with an outside provider. Patient/Guardian was advised if they have not already done so to contact the registration department to sign all necessary forms in order for us  to release information regarding their care.   Consent: Patient/Guardian gives verbal consent for treatment and assignment of benefits for services provided during this visit. Patient/Guardian expressed understanding and agreed to proceed.   [name], MSW, LCSW 06/22/2023,  2:28 PM

## 2023-06-22 NOTE — Progress Notes (Signed)
 Daily Session Note  Patient Details  Name: Jill Glenn MRN: [id number] Date of Birth: Apr 22, 1965 Referring Provider:   Gattis Kass Pulmonary Rehab Walk Test from 10/26/2022 in [facility] for Heart, Vascular, & Lung Health  Referring Provider Dione Franks    Encounter Date: 06/22/2023  Check In:  Session Check In - 06/22/23 1027       Check-In   Physician(s) Lawana Pray, NP    [facility]    Staff Present Cindra Cree, RT;Randi Regis Captain BS, ACSM-CEP, Exercise Physiologist;Mary Arlester Ladd, RN, Shasta Deist, MS, ACSM-CEP, Exercise Physiologist    Virtual Visit No    Medication changes reported     No    Fall or balance concerns reported    No    Tobacco Cessation No Change    Warm-up and Cool-down Performed as group-led instruction    Resistance Training Performed Yes    VAD Patient? No    PAD/SET Patient? No      Pain Assessment   Currently in Pain? No/denies    Pain Score 0-No pain    Multiple Pain Sites No          Capillary Blood Glucose: No results found for this or any previous visit (from the past 24 hours).    Social History   Tobacco Use  Smoking Status Former   Current packs/day: 0.00   Average packs/day: 1 pack/day for 40.6 years (40.6 ttl pk-yrs)   Types: Cigarettes   Start date: 03/05/1981   Quit date: 10/14/2021   Years since quitting: 1.6  Smokeless Tobacco Never    Goals Met:  Proper associated with RPD/PD & O2 Sat Independence with exercise equipment Exercise tolerated well No report of concerns or symptoms today Strength training completed today  Goals Unmet:  Not Applicable  Comments: Service time is from 1005 to 1140.    Dr. Genetta Kenning is Medical Director for Pulmonary Rehab at [facility].

## 2023-06-23 ENCOUNTER — Other Ambulatory Visit (HOSPITAL_COMMUNITY): Payer: Self-pay | Admitting: Student

## 2023-06-23 DIAGNOSIS — F411 Generalized anxiety disorder: Secondary | ICD-10-CM

## 2023-06-23 MED ORDER — BUSPIRONE HCL 15 MG PO TABS: 15.0000 mg | ORAL_TABLET | Freq: Two times a day (BID) | ORAL | 0 refills | Status: DC

## 2023-06-23 MED ORDER — CITALOPRAM HYDROBROMIDE 10 MG PO TABS: 10.0000 mg | ORAL_TABLET | Freq: Every day | ORAL | 0 refills | Status: DC
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
## 2023-06-26 ENCOUNTER — Ambulatory Visit (HOSPITAL_BASED_OUTPATIENT_CLINIC_OR_DEPARTMENT_OTHER): Admitting: Student

## 2023-06-26 DIAGNOSIS — F411 Generalized anxiety disorder: Secondary | ICD-10-CM

## 2023-06-26 MED ORDER — BUSPIRONE HCL 15 MG PO TABS: 15.0000 mg | ORAL_TABLET | Freq: Two times a day (BID) | ORAL | 1 refills | Status: DC

## 2023-06-26 MED ORDER — CITALOPRAM HYDROBROMIDE 20 MG PO TABS
20.0000 mg | ORAL_TABLET | Freq: Every day | ORAL | 1 refills | Status: DC
Start: 2023-06-26 — End: 2023-07-28

## 2023-06-26 MED ORDER — HYDROXYZINE HCL 25 MG PO TABS: 25.0000 mg | ORAL_TABLET | Freq: Every evening | ORAL | 0 refills | Status: DC | PRN

## 2023-06-27 ENCOUNTER — Encounter (HOSPITAL_COMMUNITY)
Admission: RE | Admit: 2023-06-27 | Discharge: 2023-06-27 | Disposition: A | Discharge status: Home / Self Care | Source: Ambulatory Visit | Attending: Internal Medicine | Admitting: Internal Medicine

## 2023-06-27 VITALS — Wt 170.9 lb

## 2023-06-27 DIAGNOSIS — J449 Chronic obstructive pulmonary disease, unspecified: Secondary | ICD-10-CM

## 2023-06-27 NOTE — Progress Notes (Signed)
 Daily Session Note  Patient Details  Name: Jill Glenn MRN: [id number] Date of Birth: [birth year] Referring Provider:   Conrad Ports Pulmonary Rehab Walk Test from 10/26/2022 in [facility]  Referring Provider [place]    Encounter Date: 06/27/2023  Check In:  Session Check In - 06/27/23 1034       Check-In   Supervising physician immediately available to respond to emergencies [facility] MD immediately available    Physician(s) Josefa Beauvais, NP    Location [facility]    Staff Present Augustin Sharps, RT;Randi Midge BS, ACSM-CEP, Exercise Physiologist;Mary Harvy, RN, Maud Moats, MS, ACSM-CEP, Exercise Physiologist    Virtual Visit No    Medication changes reported     No    Fall or balance concerns reported    No    Tobacco Cessation No Change    Warm-up and Cool-down Performed as group-led instruction    Resistance Training Performed Yes    VAD Patient? No    PAD/SET Patient? No      Pain Assessment   Currently in Pain? No/denies    Multiple Pain Sites No          Capillary Blood Glucose: No results found for this or any previous visit (from the past 24 hours).   Exercise Prescription Changes - 06/27/23 1200       Response to Exercise   Blood Pressure (Admit) 98/60    Blood Pressure (Exercise) 118/62    Blood Pressure (Exit) 96/58    Heart Rate (Admit) 63 bpm    Heart Rate (Exercise) 103 bpm    Heart Rate (Exit) 83 bpm    Oxygen  Saturation (Admit) 98 %    Oxygen  Saturation (Exercise) 94 %    Oxygen  Saturation (Exit) 95 %    Rating of Perceived Exertion (Exercise) 13    Perceived Dyspnea (Exercise) 2    Duration Continue with 30 min of aerobic exercise without signs/symptoms of physical distress.    Intensity THRR unchanged      Progression   Progression Continue to progress workloads to maintain intensity without signs/symptoms of physical distress.      Resistance Training    Training Prescription Yes    Weight black bands    Reps 10-15    Time 10 Minutes      Interval Training   Interval Training Yes    Equipment Treadmill    Comments 73min@2 .40mph&3.5%incline METS 4.1, 64min@2 .8mph&0%incline METS 2.6      Oxygen    Oxygen  Continuous    Liters 2      Recumbant Elliptical   Level 5    Minutes 15    METs 4.1      Oxygen    Maintain Oxygen  Saturation 88% or higher          Social History   Tobacco Use  Smoking Status Former   Current packs/day: 0.00   Average packs/day: 1 pack/day for 40.6 years (40.6 ttl pk-yrs)   Types: Cigarettes   Start date: 03/05/1981   Quit date: 10/14/2021   Years since quitting: 1.7  Smokeless Tobacco Never    Goals Met:  Proper associated with RPD/PD & O2 Sat Independence with exercise equipment Exercise tolerated well No report of concerns or symptoms today Strength training completed today  Goals Unmet:  Not Applicable  Comments: Service time is from 1007 to 1143.    Dr. Slater Staff is Medical Director for Pulmonary Rehab at [place]  [facility].

## 2023-06-27 NOTE — Addendum Note (Signed)
 Addended by: CARVIN CROCK on: 06/27/2023 08:36 AM   Modules accepted: Level of Service

## 2023-06-29 ENCOUNTER — Encounter (HOSPITAL_COMMUNITY)
Admission: RE | Admit: 2023-06-29 | Discharge: 2023-06-29 | Disposition: A | Discharge status: Home / Self Care | Source: Ambulatory Visit | Attending: Internal Medicine | Admitting: Internal Medicine

## 2023-06-29 DIAGNOSIS — J449 Chronic obstructive pulmonary disease, unspecified: Secondary | ICD-10-CM

## 2023-06-29 NOTE — Progress Notes (Signed)
 Daily Session Note  Patient Details  Name: Jill Glenn MRN: [id number] Date of Birth: 12-30-1965 Referring Provider:   Conrad Ports Pulmonary Rehab Walk Test from 10/26/2022 in [facility]  Referring Provider [place]    Encounter Date: 06/29/2023  Check In:  Session Check In - 06/29/23 1033       Check-In   Supervising physician immediately available to respond to emergencies [facility] MD immediately available    Physician(s) Carolyn Mose, NP    [facility]    Staff Present Augustin Sharps, RT;Randi Midge BS, ACSM-CEP, Exercise Physiologist;Mary Harvy, RN, Maud Moats, MS, ACSM-CEP, Exercise Physiologist    Virtual Visit No    Medication changes reported     No    Fall or balance concerns reported    No    Tobacco Cessation No Change    Warm-up and Cool-down Performed as group-led instruction    Resistance Training Performed Yes    VAD Patient? No    PAD/SET Patient? No      Pain Assessment   Currently in Pain? No/denies    Pain Score 0-No pain    Multiple Pain Sites No          Capillary Blood Glucose: No results found for this or any previous visit (from the past 24 hours).    Social History   Tobacco Use  Smoking Status Former   Current packs/day: 0.00   Average packs/day: 1 pack/day for 40.6 years (40.6 ttl pk-yrs)   Types: Cigarettes   Start date: 03/05/1981   Quit date: 10/14/2021   Years since quitting: 1.7  Smokeless Tobacco Never    Goals Met:  Proper associated with RPD/PD & O2 Sat Exercise tolerated well No report of concerns or symptoms today Strength training completed today  Goals Unmet:  Not Applicable  Comments: Service time is from 1008 to 1141.  Dr. Slater Staff is Medical Director for Pulmonary Rehab at [facility].

## 2023-06-30 ENCOUNTER — Other Ambulatory Visit: Payer: Self-pay | Admitting: Internal Medicine

## 2023-07-03 DIAGNOSIS — J432 Centrilobular emphysema: Secondary | ICD-10-CM | POA: Diagnosis not present

## 2023-07-03 DIAGNOSIS — J449 Chronic obstructive pulmonary disease, unspecified: Secondary | ICD-10-CM | POA: Diagnosis not present

## 2023-07-03 DIAGNOSIS — R0602 Shortness of breath: Secondary | ICD-10-CM | POA: Diagnosis not present

## 2023-07-04 ENCOUNTER — Telehealth (HOSPITAL_COMMUNITY): Payer: Self-pay | Admitting: *Deleted

## 2023-07-04 ENCOUNTER — Encounter (HOSPITAL_COMMUNITY)

## 2023-07-04 NOTE — Telephone Encounter (Signed)
 Pt LVM that she has a migraine today and will miss Pulmonary Rehab.  Aliene Aris BS, ACSM-CEP 07/04/2023 8:05 AM

## 2023-07-06 ENCOUNTER — Telehealth (HOSPITAL_COMMUNITY): Payer: Self-pay

## 2023-07-06 ENCOUNTER — Ambulatory Visit (INDEPENDENT_AMBULATORY_CARE_PROVIDER_SITE_OTHER): Admitting: Licensed Clinical Social Worker

## 2023-07-06 ENCOUNTER — Encounter (HOSPITAL_COMMUNITY)
Admission: RE | Admit: 2023-07-06 | Discharge: 2023-07-06 | Disposition: A | Discharge status: Home / Self Care | Source: Ambulatory Visit | Attending: Internal Medicine | Admitting: Internal Medicine

## 2023-07-06 DIAGNOSIS — F331 Major depressive disorder, recurrent, moderate: Secondary | ICD-10-CM | POA: Diagnosis not present

## 2023-07-06 DIAGNOSIS — J449 Chronic obstructive pulmonary disease, unspecified: Secondary | ICD-10-CM | POA: Diagnosis not present

## 2023-07-06 DIAGNOSIS — F411 Generalized anxiety disorder: Secondary | ICD-10-CM

## 2023-07-06 MED ORDER — HYDROXYZINE HCL 25 MG PO TABS: 25.0000 mg | ORAL_TABLET | Freq: Every evening | ORAL | 0 refills | Status: AC | PRN

## 2023-07-06 NOTE — Addendum Note (Signed)
 Addended by: LYNNETTE BARTER B on: 07/06/2023 04:18 PM   Modules accepted: Orders

## 2023-07-06 NOTE — Telephone Encounter (Signed)
 Hello,   Pt requesting for Hydroxyzine  to be sent to on file pharmacy [facility] on [address].   Next app with you is 7/25    JNL.

## 2023-07-06 NOTE — Progress Notes (Signed)
 Daily Session Note  Patient Details  Name: Jill Glenn MRN: [id number] Date of Birth: 10/15/1965 Referring Provider:   Conrad Ports Pulmonary Rehab Walk Test from 10/26/2022 in [facility]  Referring Provider Meade    Encounter Date: 07/06/2023  Check In:  Session Check In - 07/06/23 1038       Check-In   Supervising physician immediately available to respond to emergencies [facility] MD immediately available    Physician(s) Lum Louis, NP    [facility]    Staff Present Cloyd Aris BS, ACSM-CEP, Exercise Physiologist;Mary Harvy, RN, Maud Moats, MS, ACSM-CEP, Exercise Physiologist;Other;Samantha Belarus, RD, LDN    Virtual Visit No    Medication changes reported     No    Fall or balance concerns reported    No    Tobacco Cessation No Change    Warm-up and Cool-down Performed as group-led instruction    Resistance Training Performed Yes    VAD Patient? No    PAD/SET Patient? No      Pain Assessment   Currently in Pain? No/denies          Capillary Blood Glucose: No results found for this or any previous visit (from the past 24 hours).    Social History   Tobacco Use  Smoking Status Former   Current packs/day: 0.00   Average packs/day: 1 pack/day for 40.6 years (40.6 ttl pk-yrs)   Types: Cigarettes   Start date: 03/05/1981   Quit date: 10/14/2021   Years since quitting: 1.7  Smokeless Tobacco Never    Goals Met:  Proper associated with RPD/PD & O2 Sat Independence with exercise equipment Exercise tolerated well No report of concerns or symptoms today Strength training completed today  Goals Unmet:  Not Applicable  Comments: Service time is from 1006 to 1142.    Dr. Slater Staff is Medical Director for Pulmonary Rehab at [facility].

## 2023-07-06 NOTE — Progress Notes (Signed)
 THERAPIST PROGRESS NOTE   Session Date: 07/06/2023  Session Time: 1305 - 1357  Participation Level: Active  Behavioral Response: CasualAlertDepressed and Irritable  Type of Therapy: Individual Therapy  Treatment Goals addressed:   Progressing (3) LTG: Reduce frequency, intensity, and duration of depression symptoms so that daily functioning is improved (OP Depression) LTG: Increase coping skills to manage depression and improve ability to perform daily activities (OP Depression) LTG: I just want to be happy (OP Depression)  Not Progressing (1) LTG: Work on communicating my needs better (Anxiety)  ProgressTowards Goals: Not Progressing  Interventions: CBT, Motivational Interviewing, and Supportive  Summary: Jill Glenn is a 58 y.o. female with past psych history of MDD, GAD, and PTSD, presenting for follow-up therapy session in efforts to improve management of depressive and anxious symptoms.   Patient actively engaged in session, presenting in depressed moods, and congruent affect, with periods of tearfulness throughout duration of visit.  Patient openly engaged in introductory check-in, sharing of Doing okay, proving to further detail having decided against going forward with lung transplant due to anxiousness and potential fear surrounding spouses difficulties caring for pt and pt not wanting to cause greater stress and/or unhappiness for spouse.  Patient continued to detail increased stressors related to interactions with spouse, further detailing experienced thoughts and feelings, and belief of spouse experiencing increased difficulties in accepting, and managing feelings surrounding patient's presenting medical needs, proving to potentially create distance between them as a means of protecting himself.  Actively explored how patient proved to be navigating challenges herself, identifying continued avoidance of interactions, and watching TV as a means of dealing with stress.  Actively  processed thoughts, feelings, and perspectives related to approaching spouse regarding concerns and need of support moving forward.  Patient responded well to interventions. Patient continues to meet criteria for MDD, GAD, and PTSD. Patient will continue to benefit from engagement in outpatient therapy due to being the least restrictive service to meet presenting needs.      06/22/2023    2:23 PM 06/07/2023    1:50 PM 03/29/2023   11:14 AM 03/16/2023    1:14 PM 03/01/2023    1:08 PM  Depression screen PHQ 2/9  Decreased Interest 1 1 0 1 3  Down, Depressed, Hopeless 1 1 1 1 3   PHQ - 2 Score 2 2 1 2 6   Altered sleeping 3 1 0 2 1  Tired, decreased energy 3 1 0 2 3  Change in appetite 0 0 0 2 0  Feeling bad or failure about yourself  2 1 1 1 1   Trouble concentrating 1 1 1 2  0  Moving slowly or fidgety/restless 0 0 0 0 0  Suicidal thoughts 0 0 0 0 0  PHQ-9 Score 11 6 3 11 11   Difficult doing work/chores Somewhat difficult Somewhat difficult Not difficult at all Somewhat difficult Somewhat difficult      06/22/2023    2:15 PM 06/07/2023    1:46 PM 03/29/2023   11:11 AM 03/16/2023    1:12 PM  GAD 7 : Generalized Anxiety Score  Nervous, Anxious, on Edge 1 2 0 1  Control/stop worrying 3 3 0 1  Worry too much - different things 3 3 0 1  Trouble relaxing 2 2 0 1  Restless 3 3 1 3   Easily annoyed or irritable 2 1 0 2  Afraid - awful might happen 0 0 0 0  Total GAD 7 Score 14 14 1 9   Anxiety Difficulty Somewhat difficult Extremely  difficult Not difficult at all Somewhat difficult     Suicidal/Homicidal: Nowithout intent/plan  Therapist Response: Clinician utilized CBT, MI, and solution focused interventions to address pt's identified presenting problems and depressive and anxious sxs.   Actively greeted patient upon presenting for today's visit, engaging patient in introductory check-in, assessing presenting moods and affects, and prompting recounts of recent events and factors contributing  to presenting moods. Utilized open ended questions to elicit pt's thoughts, feelings, and perspectives surrounding recent stressors and impact on recent moods. Actively listened to pt's recounts of recent decisions surrounding medical procedure, and difficulties navigating decisions. Provided support and empathy for pt in processing challenging decisions and increased stress surrounding lack of support from spouse and avoidance of navigating pt's presenting medical needs. Utilized psychoed, CBT, and MI interventions to support pt in navigating stressors, emotional regulation, and approaching difficult tasks.  Clinician reassessed severity of depressive and anxious sxs, and presence of any safety concerns. Therapist provided support and empathy to patient during session.  Plan: Return again in 2 week.  Diagnosis:  Encounter Diagnoses  Name Primary?   Generalized anxiety disorder Yes   MDD (major depressive disorder), recurrent episode, moderate (HCC)      Collaboration of Care: Psychiatrist AEB provider notes available in EHR.  Patient/Guardian was advised Release of Information must be obtained prior to any record release in order to collaborate their care with an outside provider. Patient/Guardian was advised if they have not already done so to contact the registration department to sign all necessary forms in order for us  to release information regarding their care.   Consent: Patient/Guardian gives verbal consent for treatment and assignment of benefits for services provided during this visit. Patient/Guardian expressed understanding and agreed to proceed.   Jill Glenn, MSW, LCSW 07/06/2023,  1:57 PM

## 2023-07-10 ENCOUNTER — Other Ambulatory Visit (HOSPITAL_COMMUNITY): Payer: Self-pay

## 2023-07-11 ENCOUNTER — Encounter (HOSPITAL_COMMUNITY)
Admission: RE | Admit: 2023-07-11 | Discharge: 2023-07-11 | Disposition: A | Discharge status: Home / Self Care | Source: Ambulatory Visit | Attending: Internal Medicine | Admitting: Internal Medicine

## 2023-07-11 VITALS — Wt 169.3 lb

## 2023-07-11 DIAGNOSIS — J449 Chronic obstructive pulmonary disease, unspecified: Secondary | ICD-10-CM | POA: Diagnosis not present

## 2023-07-11 NOTE — Progress Notes (Signed)
 Daily Session Note  Patient Details  Name: Jill Glenn MRN: [id number] Date of Birth: 05/03/1965 Referring Provider:   Conrad Ports Pulmonary Rehab Walk Test from 10/26/2022 in [facility]  Referring Provider [place]    Encounter Date: 07/11/2023  Check In:  Session Check In - 07/11/23 1024       Check-In   Supervising physician immediately available to respond to emergencies [facility] MD immediately available    Physician(s) Damien Braver, NP    Location [facility]    Staff Present Ronal Levin, RN, Maud Moats, MS, ACSM-CEP, Exercise Physiologist;Casey Claudene, RT    Virtual Visit No    Medication changes reported     No    Fall or balance concerns reported    No    Tobacco Cessation No Change    Warm-up and Cool-down Performed as group-led instruction    Resistance Training Performed Yes    VAD Patient? No    PAD/SET Patient? No      Pain Assessment   Currently in Pain? No/denies    Multiple Pain Sites No          Capillary Blood Glucose: No results found for this or any previous visit (from the past 24 hours).   Exercise Prescription Changes - 07/11/23 1100       Response to Exercise   Blood Pressure (Admit) 96/56    Blood Pressure (Exercise) 102/60    Blood Pressure (Exit) 80/60    Heart Rate (Admit) 67 bpm    Heart Rate (Exercise) 103 bpm    Heart Rate (Exit) 80 bpm    Oxygen  Saturation (Admit) 97 %    Oxygen  Saturation (Exercise) 91 %    Oxygen  Saturation (Exit) 97 %    Rating of Perceived Exertion (Exercise) 13    Perceived Dyspnea (Exercise) 3    Duration Continue with 30 min of aerobic exercise without signs/symptoms of physical distress.    Intensity THRR unchanged      Progression   Progression Continue to progress workloads to maintain intensity without signs/symptoms of physical distress.      Resistance Training   Training Prescription Yes    Weight black bands     Reps 10-15    Time 10 Minutes      Interval Training   Interval Training Yes    Equipment Treadmill    Comments 42min@2 .43mph&3.5%incline METS 4.1, 64min@2 .45mph&0%incline METS 2.6      Oxygen    Oxygen  Continuous    Liters 2      Treadmill   MPH 2.8    Grade 3.5    Minutes 15    METs 4.1      Recumbant Elliptical   Level 5    Minutes 15    METs 3.3      Oxygen    Maintain Oxygen  Saturation 88% or higher          Social History   Tobacco Use  Smoking Status Former   Current packs/day: 0.00   Average packs/day: 1 pack/day for 40.6 years (40.6 ttl pk-yrs)   Types: Cigarettes   Start date: 03/05/1981   Quit date: 10/14/2021   Years since quitting: 1.7  Smokeless Tobacco Never    Goals Met:  Proper associated with RPD/PD & O2 Sat Independence with exercise equipment Exercise tolerated well No report of concerns or symptoms today Strength training completed today  Goals Unmet:  Not Applicable  Comments: Service  time is from 1002 to 1140.    Dr. Slater Staff is Medical Director for Pulmonary Rehab at [facility].

## 2023-07-12 NOTE — Progress Notes (Signed)
 Pulmonary Individual Treatment Plan  Patient Details  Name: ORETTA BERKLAND MRN: [id number] Date of Birth: May 15, 1965 Referring Provider:   Conrad Ports Pulmonary Rehab Walk Test from 10/26/2022 in [facility]  Referring Provider [place]    Initial Encounter Date:  Flowsheet Row Pulmonary Rehab Walk Test from 10/26/2022 in [facility]  Date 10/26/22    Visit Diagnosis: Stage 3 severe COPD by GOLD classification (HCC)  Patient's Home Medications on Admission:   Current Outpatient Medications:    acetaminophen  (TYLENOL ) 325 MG tablet, Take 2 tablets (650 mg total) by mouth every 6 (six) hours as needed for moderate pain, mild pain or headache., Disp: , Rfl:    albuterol  (PROVENTIL ) (2.5 MG/3ML) 0.083% nebulizer solution, Take 3 mLs (2.5 mg total) by nebulization every 6 (six) hours as needed for wheezing or shortness of breath., Disp: 75 mL, Rfl: 12   albuterol  (VENTOLIN  HFA) 108 (90 Base) MCG/ACT inhaler, Inhale 2 puffs into the lungs every 6 (six) hours as needed for wheezing or shortness of breath., Disp: 18 g, Rfl: 3   Ascorbic Acid (VITAMIN C GUMMIES PO), Take 500 mg by mouth daily., Disp: , Rfl:    busPIRone  (BUSPAR ) 15 MG tablet, Take 1 tablet (15 mg total) by mouth 2 (two) times daily., Disp: 60 tablet, Rfl: 1   citalopram  (CELEXA ) 20 MG tablet, Take 1 tablet (20 mg total) by mouth daily., Disp: 30 tablet, Rfl: 1   hydrOXYzine  (ATARAX ) 25 MG tablet, Take 1 tablet (25 mg total) by mouth at bedtime as needed (sleep)., Disp: 90 tablet, Rfl: 0   levocetirizine (XYZAL ) 5 MG tablet, TAKE 1 TABLET BY MOUTH ONCE DAILY IN THE EVENING, Disp: 30 tablet, Rfl: 0   montelukast  (SINGULAIR ) 10 MG tablet, TAKE 1 TABLET BY MOUTH AT BEDTIME, Disp: 30 tablet, Rfl: 0   Multiple Vitamin (MULTIVITAMIN ADULT) TABS, Take 1 tablet by mouth daily with breakfast., Disp: , Rfl:    rizatriptan   (MAXALT ) 10 MG tablet, Take 1 tablet (10 mg total) by mouth once as needed for migraine. May repeat in 2 hours if needed, Disp: 30 tablet, Rfl: 2   rosuvastatin  (CRESTOR ) 10 MG tablet, Take 1 tablet (10 mg total) by mouth daily., Disp: 90 tablet, Rfl: 1   TRELEGY ELLIPTA  200-62.5-25 MCG/ACT AEPB, INHALE 1 PUFF ONCE DAILY, Disp: 60 each, Rfl: 10   Ubrogepant  (UBRELVY ) 100 MG TABS, Take one tablet onset of Migraine May take another tablet 2 hours later, Don't exceed two tablets daily, Disp: 16 tablet, Rfl: 11   varenicline  (CHANTIX ) 1 MG tablet, Take 1 mg by mouth daily., Disp: , Rfl:   Past Medical History: Past Medical History:  Diagnosis Date   Allergy     seasonal   Anxiety    Asthma    COPD (chronic obstructive pulmonary disease) (HCC)    Depression    Emphysema of lung (HCC)    PTSD (post-traumatic stress disorder)     Tobacco Use: Social History   Tobacco Use  Smoking Status Former   Current packs/day: 0.00   Average packs/day: 1 pack/day for 40.6 years (40.6 ttl pk-yrs)   Types: Cigarettes   Start date: 03/05/1981   Quit date: 10/14/2021   Years since quitting: 1.7  Smokeless Tobacco Never    Labs: Review Flowsheet  More data exists      Latest Ref Rng & Units 03/24/2022 04/12/2022 11/16/2022 05/16/2023 05/23/2023  Labs for ITP Cardiac and Pulmonary Rehab  Cholestrol 0 - 200 mg/dL - 740  - 777  -  LDL (calc) 0 - 99 mg/dL - 839  - 860  -  HDL-C >39.00 mg/dL - 28.89  - 42.39  -  Trlycerides 0.0 - 149.0 mg/dL - 860.9  - 869.9  -  Hemoglobin A1c 4.6 - 6.5 % 6.0  - 5.9  - -  PH, Arterial 7.35 - 7.45 - - - - 7.49   PCO2 arterial 32 - 48 mmHg - - - - 32   Bicarbonate 20.0 - 28.0 mmol/L - - - - 24.4   O2 Saturation % - - - - 98.3     Capillary Blood Glucose: Lab Results  Component Value Date   GLUCAP 73 03/26/2022   GLUCAP 148 (H) 03/25/2022   GLUCAP 166 (H) 03/25/2022   GLUCAP 88 03/25/2022   GLUCAP 84 03/25/2022     Pulmonary Assessment  Scores:  UCSD: Self-administered rating of dyspnea associated with activities of daily living (ADLs) 6-point scale (0 = not at all to 5 = maximal or unable to do because of breathlessness)  Scoring Scores range from 0 to 120.  Minimally important difference is 5 units  CAT: CAT can identify the health impairment of COPD patients and is better correlated with disease progression.  CAT has a scoring range of zero to 40. The CAT score is classified into four groups of low (less than 10), medium (10 - 20), high (21-30) and very high (31-40) based on the impact level of disease on health status. A CAT score over 10 suggests significant symptoms.  A worsening CAT score could be explained by an exacerbation, poor medication adherence, poor inhaler technique, or progression of COPD or comorbid conditions.  CAT MCID is 2 points  mMRC: mMRC (Modified Medical Research Council) Dyspnea Scale is used to assess the degree of baseline functional disability in patients of respiratory disease due to dyspnea. No minimal important difference is established. A decrease in score of 1 point or greater is considered a positive change.   Pulmonary Function Assessment:   Exercise Target Goals: Exercise Program Goal: Individual exercise prescription set using results from initial 6 min walk test and THRR while considering  patient's activity barriers and safety.   Exercise Prescription Goal: Initial exercise prescription builds to 30-45 minutes a day of aerobic activity, 2-3 days per week.  Home exercise guidelines will be given to patient during program as part of exercise prescription that the participant will acknowledge.  Activity Barriers & Risk Stratification:   6 Minute Walk:   Oxygen  Initial Assessment:  Oxygen  Initial Assessment - 07/06/23 0728       Home Oxygen    Home Oxygen  Device Home Concentrator;Portable Concentrator;E-Tanks    Sleep Oxygen  Prescription Continuous    Liters per minute  2    Home Exercise Oxygen  Prescription Pulsed    Liters per minute 2    Home Resting Oxygen  Prescription None    Compliance with Home Oxygen  Use Yes      Program Oxygen  Prescription   Program Oxygen  Prescription Continuous    Liters per minute 2      Intervention   Short Term Goals To learn and exhibit compliance with exercise, home and travel O2 prescription;To learn and understand importance of monitoring SPO2 with pulse oximeter and demonstrate accurate use of the pulse oximeter.;To learn and understand importance of maintaining oxygen  saturations>88%;To learn and demonstrate proper pursed lip breathing techniques or  other breathing techniques. ;To learn and demonstrate proper use of respiratory medications    Long  Term Goals Exhibits compliance with exercise, home  and travel O2 prescription;Maintenance of O2 saturations>88%;Compliance with respiratory medication;Verbalizes importance of monitoring SPO2 with pulse oximeter and return demonstration;Exhibits proper breathing techniques, such as pursed lip breathing or other method taught during program session;Demonstrates proper use of MDI's          Oxygen  Re-Evaluation:  Oxygen  Re-Evaluation     Row Name 01/16/23 0934 02/13/23 1206 03/20/23 0906 04/12/23 1049 05/12/23 0942     Program Oxygen  Prescription   Program Oxygen  Prescription Continuous Continuous Continuous Continuous Continuous   Liters per minute 2 2 2 2 2      Home Oxygen    Home Oxygen  Device Home Concentrator;Portable Concentrator;E-Tanks Home Concentrator;Portable Concentrator;E-Tanks Home Concentrator;Portable Concentrator;E-Tanks Home Concentrator;Portable Concentrator;E-Tanks Home Concentrator;Portable Concentrator;E-Tanks   Sleep Oxygen  Prescription Continuous Continuous Continuous Continuous Continuous   Liters per minute 2 2 2 2 2    Home Exercise Oxygen  Prescription Pulsed Pulsed Pulsed Pulsed Pulsed   Liters per minute 2 2 2 2 2    Home Resting Oxygen   Prescription None None None None None   Compliance with Home Oxygen  Use Yes Yes Yes Yes Yes     Goals/Expected Outcomes   Short Term Goals To learn and exhibit compliance with exercise, home and travel O2 prescription;To learn and understand importance of monitoring SPO2 with pulse oximeter and demonstrate accurate use of the pulse oximeter.;To learn and understand importance of maintaining oxygen  saturations>88%;To learn and demonstrate proper pursed lip breathing techniques or other breathing techniques. ;To learn and demonstrate proper use of respiratory medications To learn and exhibit compliance with exercise, home and travel O2 prescription;To learn and understand importance of monitoring SPO2 with pulse oximeter and demonstrate accurate use of the pulse oximeter.;To learn and understand importance of maintaining oxygen  saturations>88%;To learn and demonstrate proper pursed lip breathing techniques or other breathing techniques. ;To learn and demonstrate proper use of respiratory medications To learn and exhibit compliance with exercise, home and travel O2 prescription;To learn and understand importance of monitoring SPO2 with pulse oximeter and demonstrate accurate use of the pulse oximeter.;To learn and understand importance of maintaining oxygen  saturations>88%;To learn and demonstrate proper pursed lip breathing techniques or other breathing techniques. ;To learn and demonstrate proper use of respiratory medications To learn and exhibit compliance with exercise, home and travel O2 prescription;To learn and understand importance of monitoring SPO2 with pulse oximeter and demonstrate accurate use of the pulse oximeter.;To learn and understand importance of maintaining oxygen  saturations>88%;To learn and demonstrate proper pursed lip breathing techniques or other breathing techniques. ;To learn and demonstrate proper use of respiratory medications To learn and exhibit compliance with exercise, home and  travel O2 prescription;To learn and understand importance of monitoring SPO2 with pulse oximeter and demonstrate accurate use of the pulse oximeter.;To learn and understand importance of maintaining oxygen  saturations>88%;To learn and demonstrate proper pursed lip breathing techniques or other breathing techniques. ;To learn and demonstrate proper use of respiratory medications   Long  Term Goals Exhibits compliance with exercise, home  and travel O2 prescription;Maintenance of O2 saturations>88%;Compliance with respiratory medication;Verbalizes importance of monitoring SPO2 with pulse oximeter and return demonstration;Exhibits proper breathing techniques, such as pursed lip breathing or other method taught during program session;Demonstrates proper use of MDI's Exhibits compliance with exercise, home  and travel O2 prescription;Maintenance of O2 saturations>88%;Compliance with respiratory medication;Verbalizes importance of monitoring SPO2 with pulse oximeter and return demonstration;Exhibits proper breathing techniques, such  as pursed lip breathing or other method taught during program session;Demonstrates proper use of MDI's Exhibits compliance with exercise, home  and travel O2 prescription;Maintenance of O2 saturations>88%;Compliance with respiratory medication;Verbalizes importance of monitoring SPO2 with pulse oximeter and return demonstration;Exhibits proper breathing techniques, such as pursed lip breathing or other method taught during program session;Demonstrates proper use of MDI's Exhibits compliance with exercise, home  and travel O2 prescription;Maintenance of O2 saturations>88%;Compliance with respiratory medication;Verbalizes importance of monitoring SPO2 with pulse oximeter and return demonstration;Exhibits proper breathing techniques, such as pursed lip breathing or other method taught during program session;Demonstrates proper use of MDI's Exhibits compliance with exercise, home  and travel O2  prescription;Maintenance of O2 saturations>88%;Compliance with respiratory medication;Verbalizes importance of monitoring SPO2 with pulse oximeter and return demonstration;Exhibits proper breathing techniques, such as pursed lip breathing or other method taught during program session;Demonstrates proper use of MDI's   Goals/Expected Outcomes Compliance and understanding of oxygen  saturations monitoring and breathing techniques to decrease shortness of breath. Compliance and understanding of oxygen  saturations monitoring and breathing techniques to decrease shortness of breath. Compliance and understanding of oxygen  saturations monitoring and breathing techniques to decrease shortness of breath. Compliance and understanding of oxygen  saturations monitoring and breathing techniques to decrease shortness of breath. Compliance and understanding of oxygen  saturations monitoring and breathing techniques to decrease shortness of breath.    Row Name 06/09/23 0926             Program Oxygen  Prescription   Program Oxygen  Prescription Continuous       Liters per minute 2         Home Oxygen    Home Oxygen  Device Home Concentrator;Portable Concentrator;E-Tanks       Sleep Oxygen  Prescription Continuous       Liters per minute 2       Home Exercise Oxygen  Prescription Pulsed       Liters per minute 2       Home Resting Oxygen  Prescription None       Compliance with Home Oxygen  Use Yes         Goals/Expected Outcomes   Short Term Goals To learn and exhibit compliance with exercise, home and travel O2 prescription;To learn and understand importance of monitoring SPO2 with pulse oximeter and demonstrate accurate use of the pulse oximeter.;To learn and understand importance of maintaining oxygen  saturations>88%;To learn and demonstrate proper pursed lip breathing techniques or other breathing techniques. ;To learn and demonstrate proper use of respiratory medications       Long  Term Goals Exhibits compliance  with exercise, home  and travel O2 prescription;Maintenance of O2 saturations>88%;Compliance with respiratory medication;Verbalizes importance of monitoring SPO2 with pulse oximeter and return demonstration;Exhibits proper breathing techniques, such as pursed lip breathing or other method taught during program session;Demonstrates proper use of MDI's       Goals/Expected Outcomes Compliance and understanding of oxygen  saturations monitoring and breathing techniques to decrease shortness of breath.          Oxygen  Discharge (Final Oxygen  Re-Evaluation):  Oxygen  Re-Evaluation - 06/09/23 0926       Program Oxygen  Prescription   Program Oxygen  Prescription Continuous    Liters per minute 2      Home Oxygen    Home Oxygen  Device Home Concentrator;Portable Concentrator;E-Tanks    Sleep Oxygen  Prescription Continuous    Liters per minute 2    Home Exercise Oxygen  Prescription Pulsed    Liters per minute 2    Home Resting Oxygen  Prescription None  Compliance with Home Oxygen  Use Yes      Goals/Expected Outcomes   Short Term Goals To learn and exhibit compliance with exercise, home and travel O2 prescription;To learn and understand importance of monitoring SPO2 with pulse oximeter and demonstrate accurate use of the pulse oximeter.;To learn and understand importance of maintaining oxygen  saturations>88%;To learn and demonstrate proper pursed lip breathing techniques or other breathing techniques. ;To learn and demonstrate proper use of respiratory medications    Long  Term Goals Exhibits compliance with exercise, home  and travel O2 prescription;Maintenance of O2 saturations>88%;Compliance with respiratory medication;Verbalizes importance of monitoring SPO2 with pulse oximeter and return demonstration;Exhibits proper breathing techniques, such as pursed lip breathing or other method taught during program session;Demonstrates proper use of MDI's    Goals/Expected Outcomes Compliance and understanding  of oxygen  saturations monitoring and breathing techniques to decrease shortness of breath.          Initial Exercise Prescription:   Perform Capillary Blood Glucose checks as needed.  Exercise Prescription Changes:   Exercise Prescription Changes     Row Name 01/24/23 1100 02/02/23 1158 02/21/23 1200 03/07/23 1100 03/21/23 1100     Response to Exercise   Blood Pressure (Admit) 100/60 104/60 100/60 98/60 96/56    Blood Pressure (Exercise) 118/64 -- 142/82 134/78 118/54   Blood Pressure (Exit) 96/58 94/68 92/62  90/64 90/56   Heart Rate (Admit) 65 bpm 75 bpm 76 bpm 67 bpm 67 bpm   Heart Rate (Exercise) 86 bpm 97 bpm 110 bpm 95 bpm 99 bpm   Heart Rate (Exit) 72 bpm 77 bpm 90 bpm 97 bpm 82 bpm   Oxygen  Saturation (Admit) 98 % 97 % 97 % 98 % 96 %   Oxygen  Saturation (Exercise) 95 % 95 % 92 % 93 % 93 %   Oxygen  Saturation (Exit) 98 % 98 % 97 % 97 % 96 %   Rating of Perceived Exertion (Exercise) 13 13 12.5 13 13    Perceived Dyspnea (Exercise) 1 2 2 2 2    Duration Continue with 30 min of aerobic exercise without signs/symptoms of physical distress. Continue with 30 min of aerobic exercise without signs/symptoms of physical distress. Continue with 30 min of aerobic exercise without signs/symptoms of physical distress. Continue with 30 min of aerobic exercise without signs/symptoms of physical distress. Continue with 30 min of aerobic exercise without signs/symptoms of physical distress.   Intensity THRR unchanged THRR unchanged THRR unchanged THRR unchanged THRR unchanged     Progression   Progression Continue to progress workloads to maintain intensity without signs/symptoms of physical distress. Continue to progress workloads to maintain intensity without signs/symptoms of physical distress. Continue to progress workloads to maintain intensity without signs/symptoms of physical distress. Continue to progress workloads to maintain intensity without signs/symptoms of physical distress. Continue  to progress workloads to maintain intensity without signs/symptoms of physical distress.     Resistance Training   Training Prescription Yes Yes Yes Yes Yes   Weight blue bands blue bands blue bands blue bands blue bands   Reps 10-15 10-15 10-15 10-15 10-15   Time 10 Minutes 10 Minutes 10 Minutes 10 Minutes 10 Minutes     Interval Training   Interval Training -- Yes Yes Yes Yes   Equipment -- Treadmill Treadmill Treadmill Treadmill   Comments -- 38min@1 .25moh&0%incline, 47min1.8mph&3%incline -- 21min@2mph &3%incline METS3.2, 63min1.8mph&0%incline, METS2.2 56min@2 .68mph&3%incline METS 3.6, 30min@1 .72mph&0%incline METS 2.2     Oxygen    Oxygen  Continuous Continuous Continuous Continuous Continuous   Liters 2 2 2 2  2  Treadmill   MPH 1.8 -- 2 2 --   Grade 0 -- 3 3 --   Minutes 15 -- 15 15 --   METs 2.2 -- 3.36 3.36 --     Recumbant Elliptical   Level 4 4 3 4 4    RPM 50 -- 58 -- --   Watts -- -- 78 -- --   Minutes 15 15 15 15 15    METs 2.9 3.5 3.6 3.8 4     Oxygen    Maintain Oxygen  Saturation 88% or higher 88% or higher 88% or higher 88% or higher 88% or higher    Row Name 04/04/23 1100 04/11/23 1451 05/02/23 1200 05/16/23 1100 05/25/23 1140     Response to Exercise   Blood Pressure (Admit) 100/62 96/58 102/58 94/64 92/68    Blood Pressure (Exercise) 110/64 -- 110/64 106/72 --   Blood Pressure (Exit) 94/58 92/60 102/68 104/60 126/73   Heart Rate (Admit) 72 bpm 69 bpm 69 bpm 68 bpm 66 bpm   Heart Rate (Exercise) 94 bpm 126 bpm 121 bpm 108 bpm 106 bpm   Heart Rate (Exit) 78 bpm 96 bpm 91 bpm 78 bpm 79 bpm   Oxygen  Saturation (Admit) 97 % 99 % 97 % 99 %  2L 95 %   Oxygen  Saturation (Exercise) 92 % 90 % 90 % 93 %  2L 92 %   Oxygen  Saturation (Exit) 95 % 97 % 96 % 96 %  2L 97 %   Rating of Perceived Exertion (Exercise) 13 13 12 13  12.5   Perceived Dyspnea (Exercise) 3 3 2 2 2    Duration Continue with 30 min of aerobic exercise without signs/symptoms of physical distress. Continue with 30  min of aerobic exercise without signs/symptoms of physical distress. Continue with 30 min of aerobic exercise without signs/symptoms of physical distress. Continue with 30 min of aerobic exercise without signs/symptoms of physical distress. Continue with 30 min of aerobic exercise without signs/symptoms of physical distress.   Intensity THRR unchanged THRR unchanged THRR unchanged THRR unchanged THRR unchanged     Progression   Progression Continue to progress workloads to maintain intensity without signs/symptoms of physical distress. Continue to progress workloads to maintain intensity without signs/symptoms of physical distress. Continue to progress workloads to maintain intensity without signs/symptoms of physical distress. Continue to progress workloads to maintain intensity without signs/symptoms of physical distress. Continue to progress workloads to maintain intensity without signs/symptoms of physical distress.   Average METs -- -- -- -- 4.1     Resistance Training   Training Prescription Yes Yes Yes Yes Yes   Weight blue bands blue bands blue bands blue bands blue bands   Reps 10-15 10-15 10-15 10-15 10-15   Time 10 Minutes 10 Minutes 10 Minutes 10 Minutes 10 Minutes     Interval Training   Interval Training Yes Yes Yes Yes Yes   Equipment Treadmill Treadmill Treadmill Treadmill Treadmill   Comments 45min@2 .85mph&3%incline METS 3.6, 57min@1 .32mph&0%incline METS 2.2 41min@2 .61mph&3%incline METS 3.6, 6min@1 .28mph&0%incline METS 2.2 40min@2 .35mph&3%incline METS 3.6, 87min@1 .56mph&0%incline METS 2.2 46min@2 .78mph&3%incline METS 3.6, 32min@1 .27mph&0%incline METS 2.2 63min@2 .3mph&3%incline METS 3.6, 31min@1 .11mph&0%incline METS 2.2     Oxygen    Oxygen  Continuous Continuous Continuous Continuous Continuous   Liters 2 2 2 2 2      Recumbant Elliptical   Level 4 5 5 5 5    RPM 56 56 58 -- --   Watts 75 75 84 -- --   Minutes 15 15 15 15 15    METs 3.5 3.3 4 4.2 4.1  Oxygen    Maintain Oxygen  Saturation  88% or higher 88% or higher 88% or higher 88% or higher 88% or higher    Row Name 06/13/23 1100 06/27/23 1200 07/11/23 1100         Response to Exercise   Blood Pressure (Admit) 90/68 98/60 96/56      Blood Pressure (Exercise) 120/70 118/62 102/60     Blood Pressure (Exit) 94/94 96/58 80/60      Heart Rate (Admit) 68 bpm 63 bpm 67 bpm     Heart Rate (Exercise) 90 bpm 103 bpm 103 bpm     Heart Rate (Exit) 70 bpm 83 bpm 80 bpm     Oxygen  Saturation (Admit) 98 % 98 % 97 %     Oxygen  Saturation (Exercise) 92 % 94 % 91 %     Oxygen  Saturation (Exit) 96 % 95 % 97 %     Rating of Perceived Exertion (Exercise) 13 13 13      Perceived Dyspnea (Exercise) 2 2 3      Duration Continue with 30 min of aerobic exercise without signs/symptoms of physical distress. Continue with 30 min of aerobic exercise without signs/symptoms of physical distress. Continue with 30 min of aerobic exercise without signs/symptoms of physical distress.     Intensity THRR unchanged THRR unchanged THRR unchanged       Progression   Progression Continue to progress workloads to maintain intensity without signs/symptoms of physical distress. Continue to progress workloads to maintain intensity without signs/symptoms of physical distress. Continue to progress workloads to maintain intensity without signs/symptoms of physical distress.       Resistance Training   Training Prescription Yes Yes Yes     Weight black bands black bands black bands     Reps 10-15 10-15 10-15     Time 10 Minutes 10 Minutes 10 Minutes       Interval Training   Interval Training Yes Yes Yes     Equipment Treadmill Treadmill Treadmill     Comments 32min@2 .27mph&3%incline METS 3.6, 31min@1 .34mph&0%incline METS 2.2 7min@2 .22mph&3.5%incline METS 4.1, 37min@2 .68mph&0%incline METS 2.6 69min@2 .66mph&3.5%incline METS 4.1, 74min@2 .77mph&0%incline METS 2.6       Oxygen    Oxygen  Continuous Continuous Continuous     Liters 2 2 2        Treadmill   MPH 2.3 -- 2.8      Grade 3 -- 3.5     Minutes 15 -- 15     METs 3.71 -- 4.1       Recumbant Elliptical   Level -- 5 5     Minutes -- 15 15     METs -- 4.1 3.3       Oxygen    Maintain Oxygen  Saturation 88% or higher 88% or higher 88% or higher        Exercise Comments:   Exercise Goals and Review:   Exercise Goals Re-Evaluation :  Exercise Goals Re-Evaluation     Row Name 01/16/23 9077 02/13/23 1202 03/20/23 0903 04/12/23 1047 05/12/23 0939     Exercise Goal Re-Evaluation   Exercise Goals Review Increase Physical Activity;Able to understand and use Dyspnea scale;Understanding of Exercise Prescription;Increase Strength and Stamina;Knowledge and understanding of Target Heart Rate Range (THRR);Able to understand and use rate of perceived exertion (RPE) scale Increase Physical Activity;Able to understand and use Dyspnea scale;Understanding of Exercise Prescription;Increase Strength and Stamina;Knowledge and understanding of Target Heart Rate Range (THRR);Able to understand and use rate of perceived exertion (RPE) scale Increase Physical Activity;Able to understand and use Dyspnea scale;Understanding of  Exercise Prescription;Increase Strength and Stamina;Knowledge and understanding of Target Heart Rate Range (THRR);Able to understand and use rate of perceived exertion (RPE) scale Increase Physical Activity;Able to understand and use Dyspnea scale;Understanding of Exercise Prescription;Increase Strength and Stamina;Knowledge and understanding of Target Heart Rate Range (THRR);Able to understand and use rate of perceived exertion (RPE) scale Increase Physical Activity;Able to understand and use Dyspnea scale;Understanding of Exercise Prescription;Increase Strength and Stamina;Knowledge and understanding of Target Heart Rate Range (THRR);Able to understand and use rate of perceived exertion (RPE) scale   Comments Lemma has completed 14 exercise sessions. She exercises for 15 min on the recumbent elliptical and  treadmill. She averages 2.7 METs at level 3 on the recumbent elliptical and 3.2 METs on the treadmill. She performs the warmup and cooldown standing without limitations. She has increased her interval training. Low speed and incline is 3 mph with 2% incline and 2 mph with 1.5 incine. Audrielle has not increased her recumbent elliptical level. Will encourage her to increase recumbent elliptical level. Will continue to monitor and progress as able. Librada has completed 20 exercise sessions. She exercises for 15 min on the recumbent elliptical and treadmill. She averages 3.5 METs at level 4 on the recumbent elliptical and 2.9 METs on the treadmill. She performs the warmup and cooldown standing without limitations. Toinette has increased her level on the recumbent elliptical. She tolerates this progression wel. Her treadmill speed has also slightly increased. I asked Clairissa if she wanted to discuss home exercise. Trinity feels she has all the resources she needs to exercise at home. Will continue to monitor and progress as able. Hilja has completed 27 exercise sessions. She exercises for 15 min on the recumbent elliptical and treadmill. She averages 3.8 METs at level 4 on the recumbent elliptical and 3.6 METs on the treadmill. She performs the warmup and cooldown standing without limitations. Izzy has increased her speed and incline on the treadmill as her intervals have changed. Sudiksha does intervals on certain days. Will continue to monitor and progress as able. Arlethia has completed 33 exercise sessions. She exercises for 15 min on the recumbent elliptical and treadmill. She averages 3.3 METs at level 5 on the recumbent elliptical and 2.2-3.6 METs on the treadmill. She performs the warmup and cooldown standing without limitations. Kyeshia has increased her level on the recumbent elliptical as METs remain relatively the same. Her speed has increased in her high interval. She continue to tolerate progressions well. Will  continue to monitor and progress as able. Milda has completed 39 exercise sessions. She exercises for 15 min on the recumbent elliptical and treadmill. She averages 3.9 METs at level 5 on the recumbent elliptical and 1.8-2.3 METs on the treadmill. She performs the warmup and cooldown standing without limitations. Marcell has increased her level on the recumbent elliptical as METs have increased. Her intervals on the treadmill have remained the relatively the same. Will continue to monitor and progress as able.   Expected Outcomes Through exercise at rehab and home, the patient will decrease shortness of breath with daily activities and feel confident in carrying out an exercise regimen at home. Through exercise at rehab and home, the patient will decrease shortness of breath with daily activities and feel confident in carrying out an exercise regimen at home. Through exercise at rehab and home, the patient will decrease shortness of breath with daily activities and feel confident in carrying out an exercise regimen at home. Through exercise at rehab and home, the patient will decrease  shortness of breath with daily activities and feel confident in carrying out an exercise regimen at home. Through exercise at rehab and home, the patient will decrease shortness of breath with daily activities and feel confident in carrying out an exercise regimen at home.    Row Name 06/09/23 9075 07/06/23 0728           Exercise Goal Re-Evaluation   Exercise Goals Review Increase Physical Activity;Able to understand and use Dyspnea scale;Understanding of Exercise Prescription;Increase Strength and Stamina;Knowledge and understanding of Target Heart Rate Range (THRR);Able to understand and use rate of perceived exertion (RPE) scale Increase Physical Activity;Able to understand and use Dyspnea scale;Understanding of Exercise Prescription;Increase Strength and Stamina;Knowledge and understanding of Target Heart Rate Range  (THRR);Able to understand and use rate of perceived exertion (RPE) scale      Comments Yarely has completed 43 exercise sessions. She exercises for 15 min on the recumbent elliptical and treadmill. She averages 4.1 METs at level 5 on the recumbent elliptical and 1.8-2.3 METs on the treadmill. She performs the warmup and cooldown standing without limitations. Neema has increased her level on the recumbent elliptical as METs have increased. She still tolerates progressions well. Crystelle has missed several sessions due to other appointments. Will continue to monitor and progress as able. Ellasyn has completed 49 exercise sessions. She exercises for 15 min on the recumbent elliptical and treadmill. She averages 4.1 METs at level 5 on the recumbent elliptical and 2.6-4.1 METs on the treadmill. She performs the warmup and cooldown standing without limitations. Harlowe has increased her speed  and incline intervals on the treadmill. She tolerates this well thus far. Will continue to monitor and progress as able.      Expected Outcomes Through exercise at rehab and home, the patient will decrease shortness of breath with daily activities and feel confident in carrying out an exercise regimen at home. Through exercise at rehab and home, the patient will decrease shortness of breath with daily activities and feel confident in carrying out an exercise regimen at home.         Discharge Exercise Prescription (Final Exercise Prescription Changes):  Exercise Prescription Changes - 07/11/23 1100       Response to Exercise   Blood Pressure (Admit) 96/56    Blood Pressure (Exercise) 102/60    Blood Pressure (Exit) 80/60    Heart Rate (Admit) 67 bpm    Heart Rate (Exercise) 103 bpm    Heart Rate (Exit) 80 bpm    Oxygen  Saturation (Admit) 97 %    Oxygen  Saturation (Exercise) 91 %    Oxygen  Saturation (Exit) 97 %    Rating of Perceived Exertion (Exercise) 13    Perceived Dyspnea (Exercise) 3    Duration Continue  with 30 min of aerobic exercise without signs/symptoms of physical distress.    Intensity THRR unchanged      Progression   Progression Continue to progress workloads to maintain intensity without signs/symptoms of physical distress.      Resistance Training   Training Prescription Yes    Weight black bands    Reps 10-15    Time 10 Minutes      Interval Training   Interval Training Yes    Equipment Treadmill    Comments 23min@2 .88mph&3.5%incline METS 4.1, 74min@2 .21mph&0%incline METS 2.6      Oxygen    Oxygen  Continuous    Liters 2      Treadmill   MPH 2.8    Grade 3.5  Minutes 15    METs 4.1      Recumbant Elliptical   Level 5    Minutes 15    METs 3.3      Oxygen    Maintain Oxygen  Saturation 88% or higher          Nutrition:  Target Goals: Understanding of nutrition guidelines, daily intake of sodium 1500mg , cholesterol 200mg , calories 30% from fat and 7% or less from saturated fats, daily to have 5 or more servings of fruits and vegetables.  Biometrics:    Nutrition Therapy Plan and Nutrition Goals:  Nutrition Therapy & Goals - 07/11/23 1033       Nutrition Therapy   Diet General Healthy Diet      Personal Nutrition Goals   Nutrition Goal Patient to improve dietary quality by using the plate method as a daily guide for meal planning to include lean protein/plant protein, fruits, vegetables, whole grains, and nonfat/low fat dairy as part of well balanced diet   goal in progress.   Personal Goal #2 Patient to identify strategies for weight loss of 0.5-2.0# per week of weight loss.   goal in progress.   Comments Goals in progress.  Latravia has medical history of pulmonary HTN, COPD3, chronic respiratory failure. Per [facility] transplant RD documentation on 10/05/22, it is recommended that she lose weight to BMI 30/170# and ultimately BMI 27/150#. She reports following a high protein diet, tracking calories ~1400kcals/day.  She continues to work on weight loss by  following a high protein/high fiber diet and tracking intake. She does acknowledge some emotional eating/binge type eating tendencies; she has started seeing behavioral health counselor. Answered patient questions about constipation, fiber ,etc. She is down 8.5 since highest weight starting pulmonary rehab at 80.7kg. LDL is elevated and A1c is in a prediabetic range. Locklyn would continue to benefit from weight loss and decrease intake of refined carbohydrates to support pulmonary disease.      Intervention Plan   Intervention Prescribe, educate and counsel regarding individualized specific dietary modifications aiming towards targeted core components such as weight, hypertension, lipid management, diabetes, heart failure and other comorbidities.;Nutrition handout(s) given to patient.    Expected Outcomes Short Term Goal: Understand basic principles of dietary content, such as calories, fat, sodium, cholesterol and nutrients.;Long Term Goal: Adherence to prescribed nutrition plan.          Nutrition Assessments:  MEDIFICTS Score Key: >=70 Need to make dietary changes  40-70 Heart Healthy Diet <= 40 Therapeutic Level Cholesterol Diet  Flowsheet Row PULMONARY REHAB CHRONIC OBSTRUCTIVE PULMONARY DISEASE from 02/22/2022 in [facility]  Picture Your Plate Total Score on Discharge 50   Picture Your Plate Scores: <59 Unhealthy dietary pattern with much room for improvement. 41-50 Dietary pattern unlikely to meet recommendations for good health and room for improvement. 51-60 More healthful dietary pattern, with some room for improvement.  >60 Healthy dietary pattern, although there may be some specific behaviors that could be improved.    Nutrition Goals Re-Evaluation:  Nutrition Goals Re-Evaluation     Row Name 03/13/23 1439 04/11/23 1129 05/09/23 1109 06/08/23 1006 07/11/23 1033     Goals   Current Weight 178 lb 5.6 oz (80.9 kg)  176 lb 12.9 oz (80.2 kg) 174 lb 2.6 oz (79 kg) 173 lb 1 oz (78.5 kg) 169 lb 5 oz (76.8 kg)   Comment A1c 5.9; other most recent labs LDL 134, cholesterol 208 A1c 5.9; other most  recent labs LDL 134, cholesterol 208 no new labs; most recent labs A1c 5.9,LDL 134, cholesterol 208 total cholesterol 222, LDL 139, A1c 5.9 total cholesterol 222, LDL 139, A1c 5.9   Expected Outcome Goals in progress. Weight loss goal not met. Krystalynn has medical history of pulmonary HTN, COPD3, chronic respiratory failure. Per [facility] transplant RD documentation on 10/05/22, it is recommended that she lose weight to BMI 30/170# and ultimately BMI 27/150#. She does report history of over snacking on refined carbohydates, snacking in the middle of the night, etc. She has previously completed pulmonary rehab in February 2024; her starting weight at that time was 69.8kg/153.6#. She has maintained weight over the last ~5 months. She has previously used [employer] Pal app to aid with tracking calories. She has maintained her weight since starting with our program. She does acknowledge some emotional eating/binge type eating tendencies; she has started seeing behavioral health counselor. Caitlan would continue to benefit from weight loss and decrease intake of refined carbohydrates to support pulmonary disease. Goals in progress. Weight loss goal not met; she has maintained her weight. Jeaninne has medical history of pulmonary HTN, COPD3, chronic respiratory failure. Per [facility] transplant RD documentation on 10/05/22, it is recommended that she lose weight to BMI 30/170# and ultimately BMI 27/150#. She does report history of over snacking on refined carbohydates, snacking in the middle of the night, etc. She has previously completed pulmonary rehab in February 2024; her starting weight at that time was 69.8kg/153.6#. She has maintained weight over the last ~6 months. She has previously used [employer] app to aid with tracking calories. She has maintained  her weight since starting with our program. She does acknowledge some emotional eating/binge type eating tendencies; she has started seeing behavioral health counselor. Robi would continue to benefit from weight loss and decrease intake of refined carbohydrates to support pulmonary disease. Goals in progress. Keyandra has medical history of pulmonary HTN, COPD3, chronic respiratory failure. Per [facility] transplant RD documentation on 10/05/22, it is recommended that she lose weight to BMI 30/170# and ultimately BMI 27/150#. She reports following a high protein diet, tracking calories ~1400kcals/day. She has previously completed pulmonary rehab in February 2024; her starting weight at that time was 69.8kg/153.6#. She is down 3.7# since starting pulmonary rehab this time. She does acknowledge some emotional eating/binge type eating tendencies; she has started seeing behavioral health counselor. Answered patient questions about constipation, fiber ,etc. Saleen would continue to benefit from weight loss and decrease intake of refined carbohydrates to support pulmonary disease. Goals in progress. Cris has medical history of pulmonary HTN, COPD3, chronic respiratory failure. Per [facility] transplant RD documentation on 10/05/22, it is recommended that she lose weight to BMI 30/170# and ultimately BMI 27/150#. She reports following a high protein diet, tracking calories ~1400kcals/day. She has previously completed pulmonary rehab in February 2024; her starting weight at that time was 69.8kg/153.6#. She continues to work on weight loss by following a high protein/high fiber diet and tracking intake. She does acknowledge some emotional eating/binge type eating tendencies; she has started seeing behavioral health counselor. Answered patient questions about constipation, fiber ,etc. LDL is elevated and A1c is in a prediabetic range. Adelie would continue to benefit from weight loss and decrease intake of refined carbohydrates to  support pulmonary disease. Goals in progress. Leeandra has medical history of pulmonary HTN, COPD3, chronic respiratory failure. Per [facility] transplant RD documentation on 10/05/22, it is recommended that she lose weight to BMI 30/170# and ultimately BMI 27/150#.  She reports following a high protein diet, tracking calories ~1400kcals/day. She continues to work on weight loss by following a high protein/high fiber diet and tracking intake. She does acknowledge some emotional eating/binge type eating tendencies; she has started seeing behavioral health counselor. Answered patient questions about constipation, fiber ,etc. She is down 8.5 since highest weight starting pulmonary rehab at 80.7kg. LDL is elevated and A1c is in a prediabetic range. Sarahann would continue to benefit from weight loss and decrease intake of refined carbohydrates to support pulmonary disease.      Nutrition Goals Discharge (Final Nutrition Goals Re-Evaluation):  Nutrition Goals Re-Evaluation - 07/11/23 1033       Goals   Current Weight 169 lb 5 oz (76.8 kg)    Comment total cholesterol 222, LDL 139, A1c 5.9    Expected Outcome Goals in progress. Rasheema has medical history of pulmonary HTN, COPD3, chronic respiratory failure. Per [facility] transplant RD documentation on 10/05/22, it is recommended that she lose weight to BMI 30/170# and ultimately BMI 27/150#. She reports following a high protein diet, tracking calories ~1400kcals/day. She continues to work on weight loss by following a high protein/high fiber diet and tracking intake. She does acknowledge some emotional eating/binge type eating tendencies; she has started seeing behavioral health counselor. Answered patient questions about constipation, fiber ,etc. She is down 8.5 since highest weight starting pulmonary rehab at 80.7kg. LDL is elevated and A1c is in a prediabetic range. Jadee would continue to benefit from weight loss and decrease intake of refined carbohydrates to support  pulmonary disease.          Psychosocial: Target Goals: Acknowledge presence or absence of significant depression and/or stress, maximize coping skills, provide positive support system. Participant is able to verbalize types and ability to use techniques and skills needed for reducing stress and depression.  Initial Review & Psychosocial Screening:   Quality of Life Scores:  Scores of 19 and below usually indicate a poorer quality of life in these areas.  A difference of  2-3 points is a clinically meaningful difference.  A difference of 2-3 points in the total score of the Quality of Life Index has been associated with significant improvement in overall quality of life, self-image, physical symptoms, and general health in studies assessing change in quality of life.  PHQ-9: Review Flowsheet  More data exists      06/22/2023 06/07/2023 03/29/2023 03/16/2023 03/01/2023  Depression screen PHQ 2/9  Decreased Interest       Down, Depressed, Hopeless       PHQ - 2 Score       Altered sleeping       Tired, decreased energy       Change in appetite       Feeling bad or failure about yourself        Trouble concentrating       Moving slowly or fidgety/restless       Suicidal thoughts       PHQ-9 Score       Difficult doing work/chores         Details       Information is confidential and restricted. Go to Review Flowsheets to unlock data.        Interpretation of Total Score  Total Score Depression Severity:  1-4 = Minimal depression, 5-9 = Mild depression, 10-14 = Moderate depression, 15-19 = Moderately severe depression, 20-27 = Severe depression   Psychosocial Evaluation and Intervention:   Psychosocial Re-Evaluation:  Psychosocial  Re-Evaluation     Row Name 01/18/23 1535 02/15/23 1034 03/15/23 1118 04/10/23 1429 05/10/23 0845     Psychosocial Re-Evaluation   Current issues with Current Depression;History of Depression;Current Psychotropic Meds;Current  Anxiety/Panic;Current Stress Concerns Current Depression;History of Depression;Current Psychotropic Meds;Current Anxiety/Panic;Current Stress Concerns Current Depression;History of Depression;Current Psychotropic Meds;Current Stress Concerns Current Depression;History of Depression;Current Psychotropic Meds;Current Stress Concerns Current Depression;History of Depression;Current Psychotropic Meds;Current Stress Concerns   Comments Kyung is currently struggling with making the decision to go through with an endobronchial valve replacement. She states her husband does not want her to due to the risk associated with the procedure. This decision she has to make has increased her stress level. She is still working with her mental health therapist and taking her psychotropic meds. She states she feels her depression is a little better. Auriella has made the decision not to go through with the endobronchial valve replacement. She felt the risk outweighed the benefits. She has peace about this decision. She is going to go through with the lung transplant evaluation which she is hopeful about. She feels her depression is stable at this time. She is compliant with taking her psychotropic meds and meeting with her mental health specialist.  No new barriers or concerns at this time. Dezhane denies any new psychosocial barriers or concerns at this time. She is still working with her therapist and taking her psychotropic meds. Etsuko denies any new psychosocial barriers or concerns at this time. She is still working with her therapist and taking her psychotropic meds. Aimee denies any new psychosocial barriers or concerns at this time. She has made some friends in class and this has seem to brighten her spirits. She also has been losing weight which has helped her mental health. She is compliant with taking her psychotropic meds and is still working with her mental health specialist.   Expected Outcomes For Lanika to attend the  program with less psychosocial issues or concerns. For Sommer to attend the program with less psychosocial issues or concerns. For Sarafina to attend the program with less psychosocial issues or concerns. For Shantaya to attend the program with less psychosocial issues or concerns. For Hadlyn to attend the program with less psychosocial issues or concerns.   Interventions Encouraged to attend Pulmonary Rehabilitation for the exercise;Stress management education Encouraged to attend Pulmonary Rehabilitation for the exercise Encouraged to attend Pulmonary Rehabilitation for the exercise Encouraged to attend Pulmonary Rehabilitation for the exercise Encouraged to attend Pulmonary Rehabilitation for the exercise   Continue Psychosocial Services  Follow up required by staff Follow up required by staff No Follow up required No Follow up required No Follow up required    Row Name 06/09/23 0826 06/29/23 0844           Psychosocial Re-Evaluation   Current issues with Current Depression;History of Depression;Current Psychotropic Meds;Current Stress Concerns Current Psychotropic Meds;History of Depression      Comments Kayelynn is currently dealing with new stress and anxiety over recent heart issues. She is still working with her therapist and is compliant with taking her psychotropic meds. She denies any needs at this time. Arma denies any new psychosocial barriers or concerns at this time. She is compliant with taking her psychotropic meds and continues to work with her therapist.      Expected Outcomes For Mindee to attend the program with less psychosocial issues or concerns. For Allanah to attend the program with less psychosocial issues or concerns.      Interventions --  Encouraged to attend Pulmonary Rehabilitation for the exercise      Continue Psychosocial Services  No Follow up required No Follow up required         Psychosocial Discharge (Final Psychosocial Re-Evaluation):  Psychosocial Re-Evaluation  - 06/29/23 0844       Psychosocial Re-Evaluation   Current issues with Current Psychotropic Meds;History of Depression    Comments Zarie denies any new psychosocial barriers or concerns at this time. She is compliant with taking her psychotropic meds and continues to work with her therapist.    Expected Outcomes For Marti to attend the program with less psychosocial issues or concerns.    Interventions Encouraged to attend Pulmonary Rehabilitation for the exercise    Continue Psychosocial Services  No Follow up required          Education: Education Goals: Education classes will be provided on a weekly basis, covering required topics. Participant will state understanding/return demonstration of topics presented.  Learning Barriers/Preferences:   Education Topics: Know Your Numbers Group instruction that is supported by a PowerPoint presentation. Instructor discusses importance of knowing and understanding resting, exercise, and post-exercise oxygen  saturation, heart rate, and blood pressure. Oxygen  saturation, heart rate, blood pressure, rating of perceived exertion, and dyspnea are reviewed along with a normal range for these values.  Flowsheet Row PULMONARY REHAB CHRONIC OBSTRUCTIVE PULMONARY DISEASE from 06/29/2023 in [facility]  Date 06/29/23  Educator EP  Instruction Review Code 1- Verbalizes Understanding    Exercise for the Pulmonary Patient Group instruction that is supported by a PowerPoint presentation. Instructor discusses benefits of exercise, core components of exercise, frequency, duration, and intensity of an exercise routine, importance of utilizing pulse oximetry during exercise, safety while exercising, and options of places to exercise outside of rehab.  Flowsheet Row PULMONARY REHAB CHRONIC OBSTRUCTIVE PULMONARY DISEASE from 06/22/2023 in [facility] for Heart, Vascular, & Lung  Health  Date 06/22/23  Educator EP  Instruction Review Code 1- Verbalizes Understanding    MET Level  Group instruction provided by PowerPoint, verbal discussion, and written material to support subject matter. Instructor reviews what METs are and how to increase METs.  Flowsheet Row PULMONARY REHAB CHRONIC OBSTRUCTIVE PULMONARY DISEASE from 05/25/2023 in [facility] for Heart, Vascular, & Lung Health  Date 05/25/23  Educator EP  Instruction Review Code 1- Verbalizes Understanding    Pulmonary Medications Verbally interactive group education provided by instructor with focus on inhaled medications and proper administration. Flowsheet Row PULMONARY REHAB CHRONIC OBSTRUCTIVE PULMONARY DISEASE from 06/15/2023 in [facility] for Heart, Vascular, & Lung Health  Date 06/15/23  Educator RT  Instruction Review Code 1- Verbalizes Understanding    Anatomy and Physiology of the Respiratory System Group instruction provided by PowerPoint, verbal discussion, and written material to support subject matter. Instructor reviews respiratory cycle and anatomical components of the respiratory system and their functions. Instructor also reviews differences in obstructive and restrictive respiratory diseases with examples of each.  Flowsheet Row PULMONARY REHAB CHRONIC OBSTRUCTIVE PULMONARY DISEASE from 12/15/2022 in [facility] for Heart, Vascular, & Lung Health  Date 12/15/22  Educator RT  Instruction Review Code 1- Verbalizes Understanding    Oxygen  Safety Group instruction provided by PowerPoint, verbal discussion, and written material to support subject matter. There is an overview of "What is Oxygen " and "Why do we need it".  Instructor also reviews how to create a  safe environment for oxygen  use, the importance of using oxygen  as prescribed, and the risks of noncompliance. There is a brief discussion on traveling with oxygen  and  resources the patient may utilize. Flowsheet Row PULMONARY REHAB CHRONIC OBSTRUCTIVE PULMONARY DISEASE from 07/06/2023 in [facility] for Heart, Vascular, & Lung Health  Date 07/06/23  Educator RN  Instruction Review Code 1- Verbalizes Understanding    Oxygen  Use Group instruction provided by PowerPoint, verbal discussion, and written material to discuss how supplemental oxygen  is prescribed and different types of oxygen  supply systems. Resources for more information are provided.  Flowsheet Row PULMONARY REHAB CHRONIC OBSTRUCTIVE PULMONARY DISEASE from 01/19/2023 in [facility] for Heart, Vascular, & Lung Health  Date 01/19/23  Educator RT  Instruction Review Code 1- Verbalizes Understanding    Breathing Techniques Group instruction that is supported by demonstration and informational handouts. Instructor discusses the benefits of pursed lip and diaphragmatic breathing and detailed demonstration on how to perform both.  Flowsheet Row PULMONARY REHAB CHRONIC OBSTRUCTIVE PULMONARY DISEASE from 04/27/2023 in [facility] for Heart, Vascular, & Lung Health  Date 04/27/23  Educator RN  Instruction Review Code 1- Verbalizes Understanding     Risk Factor Reduction Group instruction that is supported by a PowerPoint presentation. Instructor discusses the definition of a risk factor, different risk factors for pulmonary disease, and how the heart and lungs work together. Flowsheet Row PULMONARY REHAB CHRONIC OBSTRUCTIVE PULMONARY DISEASE from 05/18/2023 in [facility] for Heart, Vascular, & Lung Health  Date 05/18/23  Educator EP  Instruction Review Code 1- Verbalizes Understanding    Pulmonary Diseases Group instruction provided by PowerPoint, verbal discussion, and written material to support subject matter. Instructor gives an overview of the different type of pulmonary diseases. There is also a  discussion on risk factors and symptoms as well as ways to manage the diseases. Flowsheet Row PULMONARY REHAB CHRONIC OBSTRUCTIVE PULMONARY DISEASE from 03/23/2023 in [facility] for Heart, Vascular, & Lung Health  Date 03/23/23  Educator RT  Instruction Review Code 1- Verbalizes Understanding    Stress and Energy Conservation Group instruction provided by PowerPoint, verbal discussion, and written material to support subject matter. Instructor gives an overview of stress and the impact it can have on the body. Instructor also reviews ways to reduce stress. There is also a discussion on energy conservation and ways to conserve energy throughout the day. Flowsheet Row PULMONARY REHAB CHRONIC OBSTRUCTIVE PULMONARY DISEASE from 05/04/2023 in [facility] for Heart, Vascular, & Lung Health  Date 05/04/23  Educator RN  Instruction Review Code 1- Verbalizes Understanding    Warning Signs and Symptoms Group instruction provided by PowerPoint, verbal discussion, and written material to support subject matter. Instructor reviews warning signs and symptoms of stroke, heart attack, cold and flu. Instructor also reviews ways to prevent the spread of infection. Flowsheet Row PULMONARY REHAB CHRONIC OBSTRUCTIVE PULMONARY DISEASE from 05/11/2023 in [facility] for Heart, Vascular, & Lung Health  Date 05/11/23  Educator RN  Instruction Review Code 1- Verbalizes Understanding    Other Education Group or individual verbal, written, or video instructions that support the educational goals of the pulmonary rehab program. Flowsheet Row PULMONARY REHAB CHRONIC OBSTRUCTIVE PULMONARY DISEASE from 03/16/2023 in [facility] for Heart, Vascular, & Lung Health  Date 03/16/23  Educator EP  Instruction Review Code 1- Verbalizes Understanding  Knowledge Questionnaire Score:   Core Components/Risk Factors/Patient Goals at  Admission:   Core Components/Risk Factors/Patient Goals Review:   Goals and Risk Factor Review     Row Name 01/18/23 1539 02/15/23 1037 03/15/23 1120 04/10/23 1430 05/10/23 0848     Core Components/Risk Factors/Patient Goals Review   Personal Goals Review Weight Management/Obesity;Improve shortness of breath with ADL's Weight Management/Obesity;Improve shortness of breath with ADL's;Stress Weight Management/Obesity;Improve shortness of breath with ADL's;Stress Weight Management/Obesity;Improve shortness of breath with ADL's Weight Management/Obesity;Improve shortness of breath with ADL's   Review Goal progressing for weight loss. Goal progressing on improving shortness of breath with ADL's. Goal met on developing more efficient breathing techniques such as purse lipped breathing and diaphragmatic breathing; and practicing self-pacing with activity. Lilana is able to demonstrate purse lip breathing when she gets short of breath. Ayo is also able to pace herself based on her rate of perceived exertion scale. Goal progressing on decreasing stress.  Allanah is requiring 2L of O2 to maintain sats >88% while exercising. We will continue to monitor Hanah's progress throughout the program. Goal progressing for weight loss. Goal progressing on improving shortness of breath with ADL's. Goal progressing on decreasing stress.  Sincere is requiring 2L of O2 to maintain sats >88% while exercising. She is currently exercising on the recumbant elliptical and the treadmill. We will continue to monitor Salihah's progress throughout the program. Goal progressing for weight loss. Yazlynn is working with the staff dietician to obatain her weight loss goals. Goal progressing on improving shortness of breath with ADL's. Goal met on decreasing stress. She states that exercising has helped reduce her stress and she feels like her stress level is under control. Jarelly is requiring 2L of O2 to maintain sats >88% while exercising.  She is currently exercising on the recumbant elliptical and the treadmill. We will continue to monitor Francie's progress throughout the program. Monthly review of patient's Core Components/Risk Factors/Patient Goals are as follows: Goal progressing for weight loss. Emmaleah is working with the staff dietitian to obatain her weight loss goals. Goal progressing on improving shortness of breath with ADL's. Rosine is requiring 2L of O2 to maintain sats >88% while exercising. She is currently exercising on the recumbant elliptical and the treadmill. We will continue to monitor Chrisann's progress throughout the program. Monthly review of patient's Core Components/Risk Factors/Patient Goals are as follows: Goal progressing for weight loss. Jaxyn has been applying the education she has receved from the dietitician as well as using weight watchers to help facilitate weight loss. Goal progressing on improving shortness of breath with ADL's. Desha is requiring 2L of O2 to maintain sats >88% while exercising. She is currently exercising on the recumbant elliptical and the treadmill. We will continue to monitor Amaiyah's progress throughout the program.   Expected Outcomes For Jagger to lose weight, improve her SOB with ADLs and decrease stress For Buford to lose weight, improve her SOB with ADLs and decrease stress For Charlesia to lose weight and improve her SOB with ADLs For Onesty to lose weight and improve her SOB with ADLs For Srihitha to lose weight and improve her SOB with ADLs    Row Name 06/09/23 9095 06/29/23 0845           Core Components/Risk Factors/Patient Goals Review   Personal Goals Review Weight Management/Obesity;Improve shortness of breath with ADL's Weight Management/Obesity;Improve shortness of breath with ADL's      Review Monthly review of patient's Core Components/Risk Factors/Patient Goals are as follows: Goal  progressing for weight loss. Madoline has been applying the education she has received from  the dietitician as well as using weight watchers to help facilitate weight loss. Goal progressing on improving shortness of breath with ADL's. Aideliz is requiring 2L of O2 to maintain sats >88% while exercising. She is currently exercising on the recumbant elliptical and the treadmill. We will continue to monitor Jennel's progress throughout the program. Monthly review of patient's Core Components/Risk Factors/Patient Goals are as follows: Goal progressing for weight loss. Cassidee has been applying the education she has received from the dietitician as well as using weight watchers to help facilitate weight loss. Goal progressing on improving shortness of breath with ADL's. Alle is requiring 2L of O2 to maintain sats >88% while exercising. She is currently exercising on the recumbant elliptical and the treadmill. We will continue to monitor Maekayla's progress throughout the program.      Expected Outcomes For Jomaira to lose weight and improve her SOB with ADLs For Addison to lose weight and improve her SOB with ADLs         Core Components/Risk Factors/Patient Goals at Discharge (Final Review):   Goals and Risk Factor Review - 06/29/23 0845       Core Components/Risk Factors/Patient Goals Review   Personal Goals Review Weight Management/Obesity;Improve shortness of breath with ADL's    Review Monthly review of patient's Core Components/Risk Factors/Patient Goals are as follows: Goal progressing for weight loss. Ellah has been applying the education she has received from the dietitician as well as using weight watchers to help facilitate weight loss. Goal progressing on improving shortness of breath with ADL's. Krissie is requiring 2L of O2 to maintain sats >88% while exercising. She is currently exercising on the recumbant elliptical and the treadmill. We will continue to monitor Dariella's progress throughout the program.    Expected Outcomes For Veleka to lose weight and improve her SOB with ADLs           ITP Comments:Pt is making expected progress toward Pulmonary Rehab goals after completing 51 session(s). Recommend continued exercise, life style modification, education, and utilization of breathing techniques to increase stamina and strength, while also decreasing shortness of breath with exertion.  Dr. Slater Staff is Medical Director for Pulmonary Rehab at [facility].

## 2023-07-13 ENCOUNTER — Encounter (HOSPITAL_COMMUNITY)

## 2023-07-13 ENCOUNTER — Telehealth (HOSPITAL_COMMUNITY): Payer: Self-pay

## 2023-07-13 NOTE — Telephone Encounter (Signed)
 Patient left message calling out for 10:15 PR class, no reason given.

## 2023-07-16 ENCOUNTER — Other Ambulatory Visit: Payer: Self-pay | Admitting: Internal Medicine

## 2023-07-16 DIAGNOSIS — J301 Allergic rhinitis due to pollen: Secondary | ICD-10-CM

## 2023-07-18 ENCOUNTER — Encounter (HOSPITAL_COMMUNITY)

## 2023-07-18 ENCOUNTER — Encounter: Payer: Self-pay | Admitting: Internal Medicine

## 2023-07-18 ENCOUNTER — Ambulatory Visit: Admitting: Internal Medicine

## 2023-07-18 ENCOUNTER — Encounter (HOSPITAL_COMMUNITY): Payer: Self-pay

## 2023-07-18 ENCOUNTER — Telehealth (HOSPITAL_COMMUNITY): Payer: Self-pay

## 2023-07-18 VITALS — BP 98/58 | HR 65 | Ht 63.0 in | Wt 167.0 lb

## 2023-07-18 DIAGNOSIS — Z9981 Dependence on supplemental oxygen: Secondary | ICD-10-CM | POA: Diagnosis not present

## 2023-07-18 DIAGNOSIS — J439 Emphysema, unspecified: Secondary | ICD-10-CM

## 2023-07-18 DIAGNOSIS — R911 Solitary pulmonary nodule: Secondary | ICD-10-CM | POA: Diagnosis not present

## 2023-07-18 DIAGNOSIS — G4719 Other hypersomnia: Secondary | ICD-10-CM

## 2023-07-18 DIAGNOSIS — J309 Allergic rhinitis, unspecified: Secondary | ICD-10-CM

## 2023-07-18 DIAGNOSIS — G4710 Hypersomnia, unspecified: Secondary | ICD-10-CM | POA: Diagnosis not present

## 2023-07-18 DIAGNOSIS — R1319 Other dysphagia: Secondary | ICD-10-CM

## 2023-07-18 DIAGNOSIS — J4489 Other specified chronic obstructive pulmonary disease: Secondary | ICD-10-CM | POA: Diagnosis not present

## 2023-07-18 DIAGNOSIS — Z87891 Personal history of nicotine dependence: Secondary | ICD-10-CM

## 2023-07-18 DIAGNOSIS — J9611 Chronic respiratory failure with hypoxia: Secondary | ICD-10-CM | POA: Diagnosis not present

## 2023-07-18 NOTE — Patient Instructions (Addendum)
 It was a pleasure to see you today!  Please schedule follow up with myself in 3 months.  If my schedule is not open yet, we will contact you with a reminder closer to that time. Please call [phone] if you haven't heard from us  a month before, and always call us  sooner if issues or concerns arise. You can also send us  a message through MyChart, but but aware that this is not to be used for urgent issues and it may take up to 5-7 days to receive a reply. Please be aware that you will likely be able to view your results before I have a chance to respond to them. Please give us  5 business days to respond to any non-urgent results.    Before your next visit I would like you to have: Barium esophagram - swallow study Sleep study - on lab  You can try over the counter ibuprofen  and warm compresses for the chest wall and back pain - it seems like you have a muscle spasm.   Continue pulmonary rehab, trelegy, albuterol  as you are doing.

## 2023-07-18 NOTE — Progress Notes (Signed)
 Jill Glenn    [id number]    01-23-1965  Primary Care Physician:Jones, Debby LITTIE, MD Date of Appointment: 07/18/2023 Established Patient Visit  Chief complaint:   Chief Complaint  Patient presents with   Follow-up     HPI: Jill Glenn is a 58 y.o. woman with COPD, very severe FEV1 24% of predicted. She did all 13 weeks of pulmonary rehab, finished in feb 2024. Did really well, had dramatic improvement. She is on [facility] with POC Since May 2025 Quit smoking October 2023.   Interval Updates: Here for COPD follow up.  Feeling more tired, fatigued, and short of breath. Feels symptoms are progressing.   On 2LNC POC with rest, 3 with exertion.  Going to pulmonary rehab.   No interval exacerbations or hospitalizations for COPD.   Feeling chest pain with swallowing food and feels like food is getting stuck.   Also having left sided chest wall pain around her posterior ribs.  Has been started on medication for high cholesterol  On trelegy 1 puff once daily, albuterol  about 4 times/week, especially when she's going outside in the heat.   Allergies ok   Watching grandchildren home for the summer.   I have reviewed the patient's family social and past medical history and updated as appropriate.   Past Medical History:  Diagnosis Date   Allergy     seasonal   Anxiety    Asthma    COPD (chronic obstructive pulmonary disease) (HCC)    Depression    Emphysema of lung (HCC)    PTSD (post-traumatic stress disorder)     Past Surgical History:  Procedure Laterality Date   ABDOMINAL HYSTERECTOMY     BREAST EXCISIONAL BIOPSY     moles     mx 2 removed   TONSILLECTOMY      Family History  Problem Relation Age of Onset   Migraines Mother    COPD Mother    Lung cancer Mother    Bipolar disorder Mother    Melanoma Father    Colon polyps Father    Alcohol abuse Father    Melanoma Paternal Aunt    Colon cancer Neg Hx    Crohn's disease Neg Hx     Esophageal cancer Neg Hx    Rectal cancer Neg Hx    Stomach cancer Neg Hx    Ulcerative colitis Neg Hx     Social History   Occupational History   Occupation: Audiological scientist  Tobacco Use   Smoking status: Former    Current packs/day: 0.00    Average packs/day: 1 pack/day for 40.6 years (40.6 ttl pk-yrs)    Types: Cigarettes    Start date: 03/05/1981    Quit date: 10/14/2021    Years since quitting: 1.7   Smokeless tobacco: Never  Vaping Use   Vaping status: Never Used  Substance and Sexual Activity   Alcohol use: Yes    Comment: rarely - approx. 1 beer/month   Drug use: No   Sexual activity: Yes    Partners: Male     Physical Exam: Blood pressure (!) 98/58, pulse 65, height 5' 3 (1.6 m), weight 167 lb (75.8 kg), SpO2 96%.  Gen:   On poc, dyspneic Lungs:   diminished, no wheezes or crackles CV:      RRR no mrg   Data Reviewed: Imaging: I have personally reviewed the CT Chest March 2025 which shows stable LUL nodule, extensive bilateral upper lobe predominant  emphysema. Formal radiology interpretation pending.   ABG 05/23/23 shows pH 7.49, PCO2 32, PO2 82  PFTs:     Latest Ref Rng & Units 10/15/2021    8:50 AM  PFT Results  FVC-Pre L 1.58   FVC-Predicted Pre % 47   FVC-Post L 1.92   FVC-Predicted Post % 58   Pre FEV1/FVC % % 40   Post FEV1/FCV % % 41   FEV1-Pre L 0.64   FEV1-Predicted Pre % 24   FEV1-Post L 0.79   DLCO uncorrected ml/min/mmHg 11.71   DLCO UNC% % 58   DLCO corrected ml/min/mmHg 11.71   DLCO COR %Predicted % 58   DLVA Predicted % 67   TLC L 6.59   TLC % Predicted % 134   RV % Predicted % 253    I have personally reviewed the patient's PFTs and very severe COPD FEv1  Labs:  Immunization status: Immunization History  Administered Date(s) Administered   Hepb-cpg 11/16/2022   Influenza, Seasonal, Injecte, Preservative Fre 11/16/2022   Influenza,inj,Quad PF,6+ Mos 12/21/2020, 10/14/2021   PNEUMOCOCCAL CONJUGATE-20 12/21/2020   Tdap  03/22/2021   Zoster Recombinant(Shingrix ) 03/22/2021, 04/12/2022   test October 2024 at [facility] Patient walked 10 laps 0 partials 329 meters/1080 feet- 66% on Room Air   External Records Personally Reviewed: pulmonary, hospital, pcp  Assessment:  Very Severe COPD FEV 1 24% - transplant candidate evaluation at [facility] in process Seasonal allergic rhinitis, controlled History of tobacco use disorder - last smoked October 2023 Peripheral eosinophilia AEC 300 Chronic Respiratory Failure on 2L POC 0.5cm LUL nodule Esophageal Dysphagia Excessive daytime sleepiness  Plan/Recommendations:  Continue Trelegy  contniue xyzal  and singulair  for allergies.   Stay active as you are doing.   Continue pulmonary rehab.   Barium swallow for dysphagia  Split night study.   Continue follow up with [facility] for lung transplantation evaluation. I suspect she is more dyspneic and has had progression of clinical symptoms since last visit. I suspect her BODE score will have worsened but will await results which I assume will be performed at Transplant follow up.   Follow up scan after next visit for likely benign lung nodule   Return to Care: Return in about 3 months (around 10/18/2023).   Verdon Gore, MD Pulmonary and Critical Care Medicine [facility] Office:[phone]

## 2023-07-18 NOTE — Telephone Encounter (Signed)
 Patient c/o for 10:15 PR class due to migraine.

## 2023-07-20 ENCOUNTER — Encounter (HOSPITAL_COMMUNITY)
Admission: RE | Admit: 2023-07-20 | Discharge: 2023-07-20 | Disposition: A | Discharge status: Home / Self Care | Source: Ambulatory Visit | Attending: Internal Medicine | Admitting: Internal Medicine

## 2023-07-20 ENCOUNTER — Ambulatory Visit (INDEPENDENT_AMBULATORY_CARE_PROVIDER_SITE_OTHER): Admitting: Licensed Clinical Social Worker

## 2023-07-20 DIAGNOSIS — F331 Major depressive disorder, recurrent, moderate: Secondary | ICD-10-CM | POA: Diagnosis not present

## 2023-07-20 DIAGNOSIS — J449 Chronic obstructive pulmonary disease, unspecified: Secondary | ICD-10-CM

## 2023-07-20 DIAGNOSIS — F411 Generalized anxiety disorder: Secondary | ICD-10-CM | POA: Diagnosis not present

## 2023-07-20 DIAGNOSIS — F4310 Post-traumatic stress disorder, unspecified: Secondary | ICD-10-CM | POA: Diagnosis not present

## 2023-07-20 NOTE — Progress Notes (Unsigned)
 THERAPIST PROGRESS NOTE   Session Date: 07/20/2023  Session Time: 1310 - 1402  Participation Level: Active  Behavioral Response: CasualAlertDepressed  Type of Therapy: Individual Therapy  Treatment Goals addressed:   Progressing (3) LTG: Reduce frequency, intensity, and duration of depression symptoms so that daily functioning is improved (OP Depression) LTG: Increase coping skills to manage depression and improve ability to perform daily activities (OP Depression) LTG: I just want to be happy (OP Depression)  Not Progressing (1) LTG: Work on communicating my needs better (Anxiety)  ProgressTowards Goals: Not Progressing  Interventions: CBT, Motivational Interviewing, and Supportive  Summary: Jill Glenn is a 58 y.o. female with past psych history of MDD, GAD, and PTSD, presenting for follow-up therapy session in efforts to improve management of depressive and anxious symptoms.   Patient actively engaged in session, presenting in depressed moods, and congruent affect, with periods of tearfulness throughout duration of visit.  Patient openly engaged in introductory check-in, sharing of Doing alright, further detailing of things still being the same, continuing to not talk any further with spouse surrounding decision to not go forward with transplant, nor having revisited conflict with spouse, detailing of having had little interactions with spouse since conflict, proving to avoid husband in order to prevent further conflict. Actively engaged in processing 'Cycle of Avoidance' handout, exploring feelings and anxiousness prompting avoidance, the act of avoidance, relief experienced with avoidance, and intensification of feelings when later triggered. Reflected on hx of prolonged avoidance and the implications on relationship.  Patient responded well to interventions. Patient continues to meet criteria for MDD, GAD, and PTSD. Patient will continue to benefit from engagement in outpatient  therapy due to being the least restrictive service to meet presenting needs.      06/22/2023    2:23 PM 06/07/2023    1:50 PM 03/29/2023   11:14 AM 03/16/2023    1:14 PM 03/01/2023    1:08 PM  Depression screen PHQ 2/9  Decreased Interest 1 1 0 1 3  Down, Depressed, Hopeless 1 1 1 1 3   PHQ - 2 Score 2 2 1 2 6   Altered sleeping 3 1 0 2 1  Tired, decreased energy 3 1 0 2 3  Change in appetite 0 0 0 2 0  Feeling bad or failure about yourself  2 1 1 1 1   Trouble concentrating 1 1 1 2  0  Moving slowly or fidgety/restless 0 0 0 0 0  Suicidal thoughts 0 0 0 0 0  PHQ-9 Score 11 6 3 11 11   Difficult doing work/chores Somewhat difficult Somewhat difficult Not difficult at all Somewhat difficult Somewhat difficult      06/22/2023    2:15 PM 06/07/2023    1:46 PM 03/29/2023   11:11 AM 03/16/2023    1:12 PM  GAD 7 : Generalized Anxiety Score  Nervous, Anxious, on Edge 1 2 0 1  Control/stop worrying 3 3 0 1  Worry too much - different things 3 3 0 1  Trouble relaxing 2 2 0 1  Restless 3 3 1 3   Easily annoyed or irritable 2 1 0 2  Afraid - awful might happen 0 0 0 0  Total GAD 7 Score 14 14 1 9   Anxiety Difficulty Somewhat difficult Extremely difficult Not difficult at all Somewhat difficult     Suicidal/Homicidal: Nowithout intent/plan  Therapist Response: Clinician utilized CBT, MI, and solution focused interventions to address pt's identified presenting problems and depressive and anxious sxs.   Clinician actively greeted  patient upon presenting for today's visit, assessing presenting moods, and engaging patient in introductory check-in. Utilized open ended questions to engage pt in reflection of events of the past two weeks, and exploration of factors contributing to presenting moods. Actively listened to pt's recounts of events, details of minimal improvement in interactions with spouse, and individual means of dealing with stressors by avoiding spouse. Utilized socratic questioning to  aid pt in greater critical processing of thoughts and perspectives surrounding challenges. Provided support and validation for pt's expressed feelings, challenging irrational thoughts related to individual efforts at managing stress. Utilized CBT, MI, and solution focused interventions to support pt in processing avoidant behaviors and the ineffectiveness in navigating stressors long term.  Clinician reassessed severity of depressive and anxious sxs, and presence of any safety concerns. Therapist provided support and empathy to patient during session.  Plan: Return again in 2 week.  Diagnosis:  Encounter Diagnoses  Name Primary?   MDD (major depressive disorder), recurrent episode, moderate (HCC) Yes   Generalized anxiety disorder    PTSD (post-traumatic stress disorder)       Collaboration of Care: Psychiatrist AEB provider notes available in EHR.  Patient/Guardian was advised Release of Information must be obtained prior to any record release in order to collaborate their care with an outside provider. Patient/Guardian was advised if they have not already done so to contact the registration department to sign all necessary forms in order for us  to release information regarding their care.   Consent: Patient/Guardian gives verbal consent for treatment and assignment of benefits for services provided during this visit. Patient/Guardian expressed understanding and agreed to proceed.   [name], MSW, LCSW 07/20/2023,  1:13 PM

## 2023-07-20 NOTE — Progress Notes (Signed)
 Daily Session Note  Patient Details  Name: CEAIRA ERNSTER MRN: [id number] Date of Birth: [birth year] Referring Provider:   Conrad Ports Pulmonary Rehab Walk Test from 10/26/2022 in [facility]  Referring Provider [place]    Encounter Date: 07/20/2023  Check In:  Session Check In - 07/20/23 1032       Check-In   Supervising physician immediately available to respond to emergencies [facility] MD immediately available    Physician(s) Lum Louis, NP    Location [facility]    Staff Present Ronal Levin, RN, Maud Moats, MS, ACSM-CEP, Exercise Physiologist;Jeovany Huitron Claudene Meline Belarus, RD, LDN;Carlette Bernett, RN, BSN    Virtual Visit No    Medication changes reported     No    Fall or balance concerns reported    No    Tobacco Cessation No Change    Warm-up and Cool-down Performed as group-led Writer Performed Yes    VAD Patient? No    PAD/SET Patient? No      Pain Assessment   Currently in Pain? No/denies    Pain Score 0-No pain    Multiple Pain Sites No          Capillary Blood Glucose: No results found for this or any previous visit (from the past 24 hours).    Social History   Tobacco Use  Smoking Status Former   Current packs/day: 0.00   Average packs/day: 1 pack/day for 40.6 years (40.6 ttl pk-yrs)   Types: Cigarettes   Start date: 03/05/1981   Quit date: 10/14/2021   Years since quitting: 1.7  Smokeless Tobacco Never    Goals Met:  Proper associated with RPD/PD & O2 Sat Independence with exercise equipment Exercise tolerated well No report of concerns or symptoms today Strength training completed today  Goals Unmet:  Not Applicable  Comments: Service time is from 1002 to 1136.    Dr. Slater Staff is Medical Director for Pulmonary Rehab at [facility].

## 2023-07-25 ENCOUNTER — Encounter (HOSPITAL_COMMUNITY)
Admission: RE | Admit: 2023-07-25 | Discharge: 2023-07-25 | Disposition: A | Discharge status: Home / Self Care | Source: Ambulatory Visit | Attending: Internal Medicine | Admitting: Internal Medicine

## 2023-07-25 VITALS — Wt 168.7 lb

## 2023-07-25 DIAGNOSIS — J449 Chronic obstructive pulmonary disease, unspecified: Secondary | ICD-10-CM | POA: Diagnosis not present

## 2023-07-25 NOTE — Progress Notes (Signed)
 Daily Session Note  Patient Details  Name: Jill Glenn MRN: [id number] Date of Birth: 02/28/1965 Referring Provider:   Conrad Ports Pulmonary Rehab Walk Test from 10/26/2022 in [facility]  Referring Provider Meade    Encounter Date: 07/25/2023  Check In:  Session Check In - 07/25/23 1105       Check-In   Supervising physician immediately available to respond to emergencies [facility] MD immediately available    Physician(s) Rosaline Skains, NP    [facility]    Staff Present Ronal Levin, RN, Maud Moats, MS, ACSM-CEP, Exercise Physiologist;Edyn Popoca Claudene Meline Belarus, RD, LDN    Virtual Visit No    Medication changes reported     No    Fall or balance concerns reported    No    Tobacco Cessation No Change    Warm-up and Cool-down Performed as group-led instruction    Resistance Training Performed Yes    VAD Patient? No    PAD/SET Patient? No      Pain Assessment   Currently in Pain? No/denies    Multiple Pain Sites No          Capillary Blood Glucose: No results found for this or any previous visit (from the past 24 hours).   Exercise Prescription Changes - 07/25/23 1200       Response to Exercise   Blood Pressure (Admit) 92/60    Blood Pressure (Exercise) 110/62    Blood Pressure (Exit) 96/60    Heart Rate (Admit) 66 bpm    Heart Rate (Exercise) 96 bpm    Heart Rate (Exit) 76 bpm    Oxygen  Saturation (Admit) 99 %    Oxygen  Saturation (Exercise) 95 %    Oxygen  Saturation (Exit) 95 %    Rating of Perceived Exertion (Exercise) 13    Perceived Dyspnea (Exercise) 2    Duration Continue with 30 min of aerobic exercise without signs/symptoms of physical distress.    Intensity THRR unchanged      Progression   Progression Continue to progress workloads to maintain intensity without signs/symptoms of physical distress.      Resistance Training   Training Prescription  Yes    Weight black bands    Reps 10-15    Time 10 Minutes      Interval Training   Interval Training Yes    Equipment Treadmill    Comments 30min@2 .4mph&3.5%incline METS 4.1, 35min@2 .55mph&0%incline METS 2.6      Oxygen    Oxygen  Continuous    Liters 2-3      Recumbant Elliptical   Level 5    Minutes 15    METs 3.6      Oxygen    Maintain Oxygen  Saturation 88% or higher          Social History   Tobacco Use  Smoking Status Former   Current packs/day: 0.00   Average packs/day: 1 pack/day for 40.6 years (40.6 ttl pk-yrs)   Types: Cigarettes   Start date: 03/05/1981   Quit date: 10/14/2021   Years since quitting: 1.7  Smokeless Tobacco Never    Goals Met:  Proper associated with RPD/PD & O2 Sat Independence with exercise equipment Exercise tolerated well No report of concerns or symptoms today Strength training completed today  Goals Unmet:  Not Applicable  Comments: Service time is from 1015 to 1133.    Dr. Slater Staff is Medical Director for Pulmonary Rehab at [facility].

## 2023-07-27 ENCOUNTER — Encounter (HOSPITAL_COMMUNITY)
Admission: RE | Admit: 2023-07-27 | Discharge: 2023-07-27 | Disposition: A | Discharge status: Home / Self Care | Source: Ambulatory Visit | Attending: Internal Medicine | Admitting: Internal Medicine

## 2023-07-27 DIAGNOSIS — J449 Chronic obstructive pulmonary disease, unspecified: Secondary | ICD-10-CM

## 2023-07-27 NOTE — Progress Notes (Signed)
 Daily Session Note  Patient Details  Name: Jill Glenn MRN: [id number] Date of Birth: 26-Feb-1965 Referring Provider:   Conrad Ports Pulmonary Rehab Walk Test from 10/26/2022 in [facility]  Referring Provider Meade    Encounter Date: 07/27/2023  Check In:  Session Check In - 07/27/23 1021       Check-In   Supervising physician immediately available to respond to emergencies [facility] MD immediately available    Physician(s) Lum Louis, NP    [facility]    Staff Present Ronal Levin, RN, Maud Moats, MS, ACSM-CEP, Exercise Physiologist;Landyn Lorincz Claudene, RT    Virtual Visit No    Medication changes reported     No    Fall or balance concerns reported    No    Tobacco Cessation No Change    Warm-up and Cool-down Performed as group-led instruction    Resistance Training Performed Yes    VAD Patient? No    PAD/SET Patient? No      Pain Assessment   Currently in Pain? No/denies    Multiple Pain Sites No          Capillary Blood Glucose: No results found for this or any previous visit (from the past 24 hours).    Social History   Tobacco Use  Smoking Status Former   Current packs/day: 0.00   Average packs/day: 1 pack/day for 40.6 years (40.6 ttl pk-yrs)   Types: Cigarettes   Start date: 03/05/1981   Quit date: 10/14/2021   Years since quitting: 1.7  Smokeless Tobacco Never    Goals Met:  Proper associated with RPD/PD & O2 Sat Independence with exercise equipment Exercise tolerated well No report of concerns or symptoms today Strength training completed today  Goals Unmet:  Not Applicable  Comments: Service time is from 1004 to 1130.    Dr. Slater Staff is Medical Director for Pulmonary Rehab at [facility].

## 2023-07-28 ENCOUNTER — Ambulatory Visit (HOSPITAL_COMMUNITY): Admitting: Student

## 2023-07-28 DIAGNOSIS — F411 Generalized anxiety disorder: Secondary | ICD-10-CM

## 2023-07-28 MED ORDER — BUSPIRONE HCL 15 MG PO TABS: 15.0000 mg | ORAL_TABLET | Freq: Two times a day (BID) | ORAL | 0 refills | Status: DC

## 2023-07-28 MED ORDER — CITALOPRAM HYDROBROMIDE 20 MG PO TABS: 20.0000 mg | ORAL_TABLET | Freq: Every day | ORAL | 0 refills | Status: DC
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
## 2023-08-01 ENCOUNTER — Encounter (HOSPITAL_COMMUNITY)
Admission: RE | Admit: 2023-08-01 | Discharge: 2023-08-01 | Disposition: A | Discharge status: Home / Self Care | Source: Ambulatory Visit | Attending: Internal Medicine | Admitting: Internal Medicine

## 2023-08-01 VITALS — Wt 167.5 lb

## 2023-08-01 DIAGNOSIS — J449 Chronic obstructive pulmonary disease, unspecified: Secondary | ICD-10-CM

## 2023-08-01 NOTE — Progress Notes (Signed)
 Daily Session Note  Patient Details  Name: Jill Glenn MRN: [id number] Date of Birth: May 27, 1965 Referring Provider:   Conrad Ports Pulmonary Rehab Walk Test from 10/26/2022 in [facility]  Referring Provider [place]    Encounter Date: 08/01/2023  Check In:  Session Check In - 08/01/23 1155       Check-In   Supervising physician immediately available to respond to emergencies [facility] MD immediately available    Physician(s) Barnie Press, NP    Location [facility]    Staff Present Ronal Levin, RN, Maud Moats, MS, ACSM-CEP, Exercise Physiologist;Jency Schnieders Claudene, RT    Virtual Visit No    Medication changes reported     No    Fall or balance concerns reported    No    Tobacco Cessation No Change    Warm-up and Cool-down Performed as group-led instruction    Resistance Training Performed Yes    VAD Patient? No    PAD/SET Patient? No      Pain Assessment   Currently in Pain? No/denies    Multiple Pain Sites No          Capillary Blood Glucose: No results found for this or any previous visit (from the past 24 hours).    Social History   Tobacco Use  Smoking Status Former   Current packs/day: 0.00   Average packs/day: 1 pack/day for 40.6 years (40.6 ttl pk-yrs)   Types: Cigarettes   Start date: 03/05/1981   Quit date: 10/14/2021   Years since quitting: 1.7  Smokeless Tobacco Never    Goals Met:  Proper associated with RPD/PD & O2 Sat Independence with exercise equipment Exercise tolerated well No report of concerns or symptoms today Strength training completed today  Goals Unmet:  Not Applicable  Comments: Service time is from 1002 to 1127.    Dr. Slater Staff is Medical Director for Pulmonary Rehab at [facility].

## 2023-08-02 DIAGNOSIS — R0602 Shortness of breath: Secondary | ICD-10-CM | POA: Diagnosis not present

## 2023-08-02 DIAGNOSIS — J449 Chronic obstructive pulmonary disease, unspecified: Secondary | ICD-10-CM | POA: Diagnosis not present

## 2023-08-02 DIAGNOSIS — J432 Centrilobular emphysema: Secondary | ICD-10-CM | POA: Diagnosis not present

## 2023-08-03 ENCOUNTER — Encounter (HOSPITAL_COMMUNITY): Admission: RE | Admit: 2023-08-03 | Source: Ambulatory Visit

## 2023-08-03 ENCOUNTER — Telehealth (HOSPITAL_COMMUNITY): Payer: Self-pay

## 2023-08-03 ENCOUNTER — Ambulatory Visit (INDEPENDENT_AMBULATORY_CARE_PROVIDER_SITE_OTHER): Admitting: Licensed Clinical Social Worker

## 2023-08-03 DIAGNOSIS — Z91199 Patient's noncompliance with other medical treatment and regimen due to unspecified reason: Secondary | ICD-10-CM

## 2023-08-03 NOTE — Telephone Encounter (Signed)
 Pt left voicemail stating she will be absent from [place]. Pt taking care of father today.

## 2023-08-03 NOTE — Progress Notes (Signed)
 THERAPIST PROGRESS NOTE   Session Date: 08/03/2023  Session Time: 1300  Patient no-showed today's appointment; appointment was for follow up therapy services. Pt contacted office earlier today to inform of need to cancel visit due to having to tend to medical needs of family member.    [name], MSW, LCSW 08/03/2023,  11:59 AM

## 2023-08-08 ENCOUNTER — Telehealth (HOSPITAL_COMMUNITY): Payer: Self-pay

## 2023-08-08 ENCOUNTER — Encounter (HOSPITAL_COMMUNITY): Admission: RE | Admit: 2023-08-08 | Source: Ambulatory Visit
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
## 2023-08-09 NOTE — Progress Notes (Signed)
 Pulmonary Individual Treatment Plan  Patient Details  Name: Jill Glenn MRN: [id number] Date of Birth: 02-May-1965 Referring Provider:   Conrad Ports Pulmonary Rehab Walk Test from 10/26/2022 in [facility]  Referring Provider [place]    Initial Encounter Date:  Flowsheet Row Pulmonary Rehab Walk Test from 10/26/2022 in [facility]  Date 10/26/22    Visit Diagnosis: Stage 3 severe COPD by GOLD classification (HCC)  Patient's Home Medications on Admission:   Current Outpatient Medications:    acetaminophen  (TYLENOL ) 325 MG tablet, Take 2 tablets (650 mg total) by mouth every 6 (six) hours as needed for moderate pain, mild pain or headache., Disp: , Rfl:    albuterol  (PROVENTIL ) (2.5 MG/3ML) 0.083% nebulizer solution, Take 3 mLs (2.5 mg total) by nebulization every 6 (six) hours as needed for wheezing or shortness of breath., Disp: 75 mL, Rfl: 12   albuterol  (VENTOLIN  HFA) 108 (90 Base) MCG/ACT inhaler, Inhale 2 puffs into the lungs every 6 (six) hours as needed for wheezing or shortness of breath., Disp: 18 g, Rfl: 3   Ascorbic Acid (VITAMIN C GUMMIES PO), Take 500 mg by mouth daily., Disp: , Rfl:    busPIRone  (BUSPAR ) 15 MG tablet, Take 1 tablet (15 mg total) by mouth 2 (two) times daily., Disp: 180 tablet, Rfl: 0   citalopram  (CELEXA ) 20 MG tablet, Take 1 tablet (20 mg total) by mouth daily., Disp: 90 tablet, Rfl: 0   hydrOXYzine  (ATARAX ) 25 MG tablet, Take 1 tablet (25 mg total) by mouth at bedtime as needed (sleep)., Disp: 90 tablet, Rfl: 0   levocetirizine (XYZAL ) 5 MG tablet, TAKE 1 TABLET BY MOUTH ONCE DAILY IN THE EVENING, Disp: 30 tablet, Rfl: 0   montelukast  (SINGULAIR ) 10 MG tablet, TAKE 1 TABLET BY MOUTH AT BEDTIME, Disp: 30 tablet, Rfl: 0   Multiple Vitamin (MULTIVITAMIN ADULT) TABS, Take 1 tablet by mouth daily with breakfast., Disp: , Rfl:     rizatriptan  (MAXALT ) 10 MG tablet, Take 1 tablet (10 mg total) by mouth once as needed for migraine. May repeat in 2 hours if needed, Disp: 30 tablet, Rfl: 2   rosuvastatin  (CRESTOR ) 10 MG tablet, Take 1 tablet (10 mg total) by mouth daily., Disp: 90 tablet, Rfl: 1   TRELEGY ELLIPTA  200-62.5-25 MCG/ACT AEPB, INHALE 1 PUFF ONCE DAILY, Disp: 60 each, Rfl: 10   Ubrogepant  (UBRELVY ) 100 MG TABS, Take one tablet onset of Migraine May take another tablet 2 hours later, Don't exceed two tablets daily, Disp: 16 tablet, Rfl: 11   varenicline  (CHANTIX ) 1 MG tablet, Take 1 mg by mouth daily., Disp: , Rfl:   Past Medical History: Past Medical History:  Diagnosis Date   Allergy     seasonal   Anxiety    Asthma    COPD (chronic obstructive pulmonary disease) (HCC)    Depression    Emphysema of lung (HCC)    PTSD (post-traumatic stress disorder)     Tobacco Use: Social History   Tobacco Use  Smoking Status Former   Current packs/day: 0.00   Average packs/day: 1 pack/day for 40.6 years (40.6 ttl pk-yrs)   Types: Cigarettes   Start date: 03/05/1981   Quit date: 10/14/2021   Years since quitting: 1.8  Smokeless Tobacco Never    Labs: Review Flowsheet  More data exists      Latest Ref Rng & Units 03/24/2022 04/12/2022 11/16/2022 05/16/2023 05/23/2023  Labs for ITP Cardiac and Pulmonary Rehab  Cholestrol 0 - 200 mg/dL - 740  - 777  -  LDL (calc) 0 - 99 mg/dL - 839  - 860  -  HDL-C >39.00 mg/dL - 28.89  - 42.39  -  Trlycerides 0.0 - 149.0 mg/dL - 860.9  - 869.9  -  Hemoglobin A1c 4.6 - 6.5 % 6.0  - 5.9  - -  PH, Arterial 7.35 - 7.45 - - - - 7.49   PCO2 arterial 32 - 48 mmHg - - - - 32   Bicarbonate 20.0 - 28.0 mmol/L - - - - 24.4   O2 Saturation % - - - - 98.3     Capillary Blood Glucose: Lab Results  Component Value Date   GLUCAP 73 03/26/2022   GLUCAP 148 (H) 03/25/2022   GLUCAP 166 (H) 03/25/2022   GLUCAP 88 03/25/2022   GLUCAP 84 03/25/2022     Pulmonary Assessment Scores:   Pulmonary Assessment Scores     Row Name 07/25/23 1634         ADL UCSD   ADL Phase Exit     SOB Score total 66       CAT Score   CAT Score 20       UCSD: Self-administered rating of dyspnea associated with activities of daily living (ADLs) 6-point scale (0 = not at all to 5 = maximal or unable to do because of breathlessness)  Scoring Scores range from 0 to 120.  Minimally important difference is 5 units  CAT: CAT can identify the health impairment of COPD patients and is better correlated with disease progression.  CAT has a scoring range of zero to 40. The CAT score is classified into four groups of low (less than 10), medium (10 - 20), high (21-30) and very high (31-40) based on the impact level of disease on health status. A CAT score over 10 suggests significant symptoms.  A worsening CAT score could be explained by an exacerbation, poor medication adherence, poor inhaler technique, or progression of COPD or comorbid conditions.  CAT MCID is 2 points  mMRC: mMRC (Modified Medical Research Council) Dyspnea Scale is used to assess the degree of baseline functional disability in patients of respiratory disease due to dyspnea. No minimal important difference is established. A decrease in score of 1 point or greater is considered a positive change.   Pulmonary Function Assessment:   Exercise Target Goals: Exercise Program Goal: Individual exercise prescription set using results from initial 6 min walk test and THRR while considering  patient's activity barriers and safety.   Exercise Prescription Goal: Initial exercise prescription builds to 30-45 minutes a day of aerobic activity, 2-3 days per week.  Home exercise guidelines will be given to patient during program as part of exercise prescription that the participant will acknowledge.  Activity Barriers & Risk Stratification:   6 Minute Walk:   Oxygen  Initial Assessment:  Oxygen  Initial Assessment - 07/06/23 0728        Home Oxygen    Home Oxygen  Device Home Concentrator;Portable Concentrator;E-Tanks    Sleep Oxygen  Prescription Continuous    Liters per minute 2    Home Exercise Oxygen  Prescription Pulsed    Liters per minute 2    Home Resting Oxygen  Prescription None    Compliance with Home Oxygen  Use Yes      Program Oxygen  Prescription   Program Oxygen  Prescription Continuous    Liters per minute 2  Intervention   Short Term Goals To learn and exhibit compliance with exercise, home and travel O2 prescription;To learn and understand importance of monitoring SPO2 with pulse oximeter and demonstrate accurate use of the pulse oximeter.;To learn and understand importance of maintaining oxygen  saturations>88%;To learn and demonstrate proper pursed lip breathing techniques or other breathing techniques. ;To learn and demonstrate proper use of respiratory medications    Long  Term Goals Exhibits compliance with exercise, home  and travel O2 prescription;Maintenance of O2 saturations>88%;Compliance with respiratory medication;Verbalizes importance of monitoring SPO2 with pulse oximeter and return demonstration;Exhibits proper breathing techniques, such as pursed lip breathing or other method taught during program session;Demonstrates proper use of MDI's          Oxygen  Re-Evaluation:  Oxygen  Re-Evaluation     Row Name 02/13/23 1206 03/20/23 0906 04/12/23 1049 05/12/23 0942 06/09/23 0926     Program Oxygen  Prescription   Program Oxygen  Prescription Continuous Continuous Continuous Continuous Continuous   Liters per minute 2 2 2 2 2      Home Oxygen    Home Oxygen  Device Home Concentrator;Portable Concentrator;E-Tanks Home Concentrator;Portable Concentrator;E-Tanks Home Concentrator;Portable Concentrator;E-Tanks Home Concentrator;Portable Concentrator;E-Tanks Home Concentrator;Portable Concentrator;E-Tanks   Sleep Oxygen  Prescription Continuous Continuous Continuous Continuous Continuous   Liters  per minute 2 2 2 2 2    Home Exercise Oxygen  Prescription Pulsed Pulsed Pulsed Pulsed Pulsed   Liters per minute 2 2 2 2 2    Home Resting Oxygen  Prescription None None None None None   Compliance with Home Oxygen  Use Yes Yes Yes Yes Yes     Goals/Expected Outcomes   Short Term Goals To learn and exhibit compliance with exercise, home and travel O2 prescription;To learn and understand importance of monitoring SPO2 with pulse oximeter and demonstrate accurate use of the pulse oximeter.;To learn and understand importance of maintaining oxygen  saturations>88%;To learn and demonstrate proper pursed lip breathing techniques or other breathing techniques. ;To learn and demonstrate proper use of respiratory medications To learn and exhibit compliance with exercise, home and travel O2 prescription;To learn and understand importance of monitoring SPO2 with pulse oximeter and demonstrate accurate use of the pulse oximeter.;To learn and understand importance of maintaining oxygen  saturations>88%;To learn and demonstrate proper pursed lip breathing techniques or other breathing techniques. ;To learn and demonstrate proper use of respiratory medications To learn and exhibit compliance with exercise, home and travel O2 prescription;To learn and understand importance of monitoring SPO2 with pulse oximeter and demonstrate accurate use of the pulse oximeter.;To learn and understand importance of maintaining oxygen  saturations>88%;To learn and demonstrate proper pursed lip breathing techniques or other breathing techniques. ;To learn and demonstrate proper use of respiratory medications To learn and exhibit compliance with exercise, home and travel O2 prescription;To learn and understand importance of monitoring SPO2 with pulse oximeter and demonstrate accurate use of the pulse oximeter.;To learn and understand importance of maintaining oxygen  saturations>88%;To learn and demonstrate proper pursed lip breathing techniques or  other breathing techniques. ;To learn and demonstrate proper use of respiratory medications To learn and exhibit compliance with exercise, home and travel O2 prescription;To learn and understand importance of monitoring SPO2 with pulse oximeter and demonstrate accurate use of the pulse oximeter.;To learn and understand importance of maintaining oxygen  saturations>88%;To learn and demonstrate proper pursed lip breathing techniques or other breathing techniques. ;To learn and demonstrate proper use of respiratory medications   Long  Term Goals Exhibits compliance with exercise, home  and travel O2 prescription;Maintenance of O2 saturations>88%;Compliance with respiratory medication;Verbalizes importance of monitoring SPO2 with pulse  oximeter and return demonstration;Exhibits proper breathing techniques, such as pursed lip breathing or other method taught during program session;Demonstrates proper use of MDI's Exhibits compliance with exercise, home  and travel O2 prescription;Maintenance of O2 saturations>88%;Compliance with respiratory medication;Verbalizes importance of monitoring SPO2 with pulse oximeter and return demonstration;Exhibits proper breathing techniques, such as pursed lip breathing or other method taught during program session;Demonstrates proper use of MDI's Exhibits compliance with exercise, home  and travel O2 prescription;Maintenance of O2 saturations>88%;Compliance with respiratory medication;Verbalizes importance of monitoring SPO2 with pulse oximeter and return demonstration;Exhibits proper breathing techniques, such as pursed lip breathing or other method taught during program session;Demonstrates proper use of MDI's Exhibits compliance with exercise, home  and travel O2 prescription;Maintenance of O2 saturations>88%;Compliance with respiratory medication;Verbalizes importance of monitoring SPO2 with pulse oximeter and return demonstration;Exhibits proper breathing techniques, such as pursed  lip breathing or other method taught during program session;Demonstrates proper use of MDI's Exhibits compliance with exercise, home  and travel O2 prescription;Maintenance of O2 saturations>88%;Compliance with respiratory medication;Verbalizes importance of monitoring SPO2 with pulse oximeter and return demonstration;Exhibits proper breathing techniques, such as pursed lip breathing or other method taught during program session;Demonstrates proper use of MDI's   Goals/Expected Outcomes Compliance and understanding of oxygen  saturations monitoring and breathing techniques to decrease shortness of breath. Compliance and understanding of oxygen  saturations monitoring and breathing techniques to decrease shortness of breath. Compliance and understanding of oxygen  saturations monitoring and breathing techniques to decrease shortness of breath. Compliance and understanding of oxygen  saturations monitoring and breathing techniques to decrease shortness of breath. Compliance and understanding of oxygen  saturations monitoring and breathing techniques to decrease shortness of breath.    Row Name 08/02/23 0922             Program Oxygen  Prescription   Program Oxygen  Prescription Continuous       Liters per minute 2         Home Oxygen    Home Oxygen  Device Home Concentrator;Portable Concentrator;E-Tanks       Sleep Oxygen  Prescription Continuous       Liters per minute 2       Home Exercise Oxygen  Prescription Pulsed       Liters per minute 2       Home Resting Oxygen  Prescription None       Compliance with Home Oxygen  Use Yes         Goals/Expected Outcomes   Short Term Goals To learn and exhibit compliance with exercise, home and travel O2 prescription;To learn and understand importance of monitoring SPO2 with pulse oximeter and demonstrate accurate use of the pulse oximeter.;To learn and understand importance of maintaining oxygen  saturations>88%;To learn and demonstrate proper pursed lip breathing  techniques or other breathing techniques. ;To learn and demonstrate proper use of respiratory medications       Long  Term Goals Exhibits compliance with exercise, home  and travel O2 prescription;Maintenance of O2 saturations>88%;Compliance with respiratory medication;Verbalizes importance of monitoring SPO2 with pulse oximeter and return demonstration;Exhibits proper breathing techniques, such as pursed lip breathing or other method taught during program session;Demonstrates proper use of MDI's       Goals/Expected Outcomes Compliance and understanding of oxygen  saturations monitoring and breathing techniques to decrease shortness of breath.          Oxygen  Discharge (Final Oxygen  Re-Evaluation):  Oxygen  Re-Evaluation - 08/02/23 0922       Program Oxygen  Prescription   Program Oxygen  Prescription Continuous    Liters per minute 2  Home Oxygen    Home Oxygen  Device Home Concentrator;Portable Concentrator;E-Tanks    Sleep Oxygen  Prescription Continuous    Liters per minute 2    Home Exercise Oxygen  Prescription Pulsed    Liters per minute 2    Home Resting Oxygen  Prescription None    Compliance with Home Oxygen  Use Yes      Goals/Expected Outcomes   Short Term Goals To learn and exhibit compliance with exercise, home and travel O2 prescription;To learn and understand importance of monitoring SPO2 with pulse oximeter and demonstrate accurate use of the pulse oximeter.;To learn and understand importance of maintaining oxygen  saturations>88%;To learn and demonstrate proper pursed lip breathing techniques or other breathing techniques. ;To learn and demonstrate proper use of respiratory medications    Long  Term Goals Exhibits compliance with exercise, home  and travel O2 prescription;Maintenance of O2 saturations>88%;Compliance with respiratory medication;Verbalizes importance of monitoring SPO2 with pulse oximeter and return demonstration;Exhibits proper breathing techniques, such as pursed  lip breathing or other method taught during program session;Demonstrates proper use of MDI's    Goals/Expected Outcomes Compliance and understanding of oxygen  saturations monitoring and breathing techniques to decrease shortness of breath.          Initial Exercise Prescription:   Perform Capillary Blood Glucose checks as needed.  Exercise Prescription Changes:   Exercise Prescription Changes     Row Name 02/21/23 1200 03/07/23 1100 03/21/23 1100 04/04/23 1100 04/11/23 1451     Response to Exercise   Blood Pressure (Admit) 100/60 98/60 96/56  100/62 96/58   Blood Pressure (Exercise) 142/82 134/78 118/54 110/64 --   Blood Pressure (Exit) 92/62 90/64 90/56  94/58 92/60   Heart Rate (Admit) 76 bpm 67 bpm 67 bpm 72 bpm 69 bpm   Heart Rate (Exercise) 110 bpm 95 bpm 99 bpm 94 bpm 126 bpm   Heart Rate (Exit) 90 bpm 97 bpm 82 bpm 78 bpm 96 bpm   Oxygen  Saturation (Admit) 97 % 98 % 96 % 97 % 99 %   Oxygen  Saturation (Exercise) 92 % 93 % 93 % 92 % 90 %   Oxygen  Saturation (Exit) 97 % 97 % 96 % 95 % 97 %   Rating of Perceived Exertion (Exercise) 12.5 13 13 13 13    Perceived Dyspnea (Exercise) 2 2 2 3 3    Duration Continue with 30 min of aerobic exercise without signs/symptoms of physical distress. Continue with 30 min of aerobic exercise without signs/symptoms of physical distress. Continue with 30 min of aerobic exercise without signs/symptoms of physical distress. Continue with 30 min of aerobic exercise without signs/symptoms of physical distress. Continue with 30 min of aerobic exercise without signs/symptoms of physical distress.   Intensity THRR unchanged THRR unchanged THRR unchanged THRR unchanged THRR unchanged     Progression   Progression Continue to progress workloads to maintain intensity without signs/symptoms of physical distress. Continue to progress workloads to maintain intensity without signs/symptoms of physical distress. Continue to progress workloads to maintain intensity  without signs/symptoms of physical distress. Continue to progress workloads to maintain intensity without signs/symptoms of physical distress. Continue to progress workloads to maintain intensity without signs/symptoms of physical distress.     Resistance Training   Training Prescription Yes Yes Yes Yes Yes   Weight blue bands blue bands blue bands blue bands blue bands   Reps 10-15 10-15 10-15 10-15 10-15   Time 10 Minutes 10 Minutes 10 Minutes 10 Minutes 10 Minutes     Interval Training   Interval Training Yes  Yes Yes Yes Yes   Equipment Treadmill Treadmill Treadmill Treadmill Treadmill   Comments -- 26min@2mph &3%incline METS3.2, 3min1.8mph&0%incline, METS2.2 66min@2 .78mph&3%incline METS 3.6, 84min@1 .45mph&0%incline METS 2.2 9min@2 .65mph&3%incline METS 3.6, 64min@1 .42mph&0%incline METS 2.2 51min@2 .36mph&3%incline METS 3.6, 14min@1 .23mph&0%incline METS 2.2     Oxygen    Oxygen  Continuous Continuous Continuous Continuous Continuous   Liters 2 2 2 2 2      Treadmill   MPH 2 2 -- -- --   Grade 3 3 -- -- --   Minutes 15 15 -- -- --   METs 3.36 3.36 -- -- --     Recumbant Elliptical   Level 3 4 4 4 5    RPM 58 -- -- 56 56   Watts 78 -- -- 75 75   Minutes 15 15 15 15 15    METs 3.6 3.8 4 3.5 3.3     Oxygen    Maintain Oxygen  Saturation 88% or higher 88% or higher 88% or higher 88% or higher 88% or higher    Row Name 05/02/23 1200 05/16/23 1100 05/25/23 1140 06/13/23 1100 06/27/23 1200     Response to Exercise   Blood Pressure (Admit) 102/58 94/64 92/68  90/68 98/60   Blood Pressure (Exercise) 110/64 106/72 -- 120/70 118/62   Blood Pressure (Exit) 102/68 104/60 126/73 94/94 96/58    Heart Rate (Admit) 69 bpm 68 bpm 66 bpm 68 bpm 63 bpm   Heart Rate (Exercise) 121 bpm 108 bpm 106 bpm 90 bpm 103 bpm   Heart Rate (Exit) 91 bpm 78 bpm 79 bpm 70 bpm 83 bpm   Oxygen  Saturation (Admit) 97 % 99 %  2L 95 % 98 % 98 %   Oxygen  Saturation (Exercise) 90 % 93 %  2L 92 % 92 % 94 %   Oxygen  Saturation (Exit) 96 %  96 %  2L 97 % 96 % 95 %   Rating of Perceived Exertion (Exercise) 12 13 12.5 13 13    Perceived Dyspnea (Exercise) 2 2 2 2 2    Duration Continue with 30 min of aerobic exercise without signs/symptoms of physical distress. Continue with 30 min of aerobic exercise without signs/symptoms of physical distress. Continue with 30 min of aerobic exercise without signs/symptoms of physical distress. Continue with 30 min of aerobic exercise without signs/symptoms of physical distress. Continue with 30 min of aerobic exercise without signs/symptoms of physical distress.   Intensity THRR unchanged THRR unchanged THRR unchanged THRR unchanged THRR unchanged     Progression   Progression Continue to progress workloads to maintain intensity without signs/symptoms of physical distress. Continue to progress workloads to maintain intensity without signs/symptoms of physical distress. Continue to progress workloads to maintain intensity without signs/symptoms of physical distress. Continue to progress workloads to maintain intensity without signs/symptoms of physical distress. Continue to progress workloads to maintain intensity without signs/symptoms of physical distress.   Average METs -- -- 4.1 -- --     Resistance Training   Training Prescription Yes Yes Yes Yes Yes   Weight blue bands blue bands blue bands black bands black bands   Reps 10-15 10-15 10-15 10-15 10-15   Time 10 Minutes 10 Minutes 10 Minutes 10 Minutes 10 Minutes     Interval Training   Interval Training Yes Yes Yes Yes Yes   Equipment Treadmill Treadmill Treadmill Treadmill Treadmill   Comments 9min@2 .36mph&3%incline METS 3.6, 85min@1 .88mph&0%incline METS 2.2 56min@2 .50mph&3%incline METS 3.6, 21min@1 .47mph&0%incline METS 2.2 24min@2 .57mph&3%incline METS 3.6, 31min@1 .24mph&0%incline METS 2.2 85min@2 .65mph&3%incline METS 3.6, 59min@1 .61mph&0%incline METS 2.2 8min@2 .24mph&3.5%incline METS 4.1, 36min@2 .69mph&0%incline METS 2.6  Oxygen    Oxygen  Continuous  Continuous Continuous Continuous Continuous   Liters 2 2 2 2 2      Treadmill   MPH -- -- -- 2.3 --   Grade -- -- -- 3 --   Minutes -- -- -- 15 --   METs -- -- -- 3.71 --     Recumbant Elliptical   Level 5 5 5  -- 5   RPM 58 -- -- -- --   Watts 84 -- -- -- --   Minutes 15 15 15  -- 15   METs 4 4.2 4.1 -- 4.1     Oxygen    Maintain Oxygen  Saturation 88% or higher 88% or higher 88% or higher 88% or higher 88% or higher    Row Name 07/11/23 1100 07/25/23 1200 08/01/23 1204         Response to Exercise   Blood Pressure (Admit) 96/56 92/60 98/70      Blood Pressure (Exercise) 102/60 110/62 --     Blood Pressure (Exit) 80/60 96/60 94/60      Heart Rate (Admit) 67 bpm 66 bpm 67 bpm     Heart Rate (Exercise) 103 bpm 96 bpm 109 bpm     Heart Rate (Exit) 80 bpm 76 bpm 79 bpm     Oxygen  Saturation (Admit) 97 % 99 % 97 %     Oxygen  Saturation (Exercise) 91 % 95 % 93 %     Oxygen  Saturation (Exit) 97 % 95 % 97 %     Rating of Perceived Exertion (Exercise) 13 13 13      Perceived Dyspnea (Exercise) 3 2 2      Duration Continue with 30 min of aerobic exercise without signs/symptoms of physical distress. Continue with 30 min of aerobic exercise without signs/symptoms of physical distress. Continue with 30 min of aerobic exercise without signs/symptoms of physical distress.     Intensity THRR unchanged THRR unchanged THRR unchanged       Progression   Progression Continue to progress workloads to maintain intensity without signs/symptoms of physical distress. Continue to progress workloads to maintain intensity without signs/symptoms of physical distress. Continue to progress workloads to maintain intensity without signs/symptoms of physical distress.       Resistance Training   Training Prescription Yes Yes Yes     Weight black bands black bands black bands     Reps 10-15 10-15 10-15     Time 10 Minutes 10 Minutes 10 Minutes       Interval Training   Interval Training Yes Yes Yes      Equipment Treadmill Treadmill Treadmill     Comments 38min@2 .60mph&3.5%incline METS 4.1, 63min@2 .9mph&0%incline METS 2.6 24min@2 .4mph&3.5%incline METS 4.1, 33min@2 .71mph&0%incline METS 2.6 91min@2 .54mph&3.5%incline METS 4.1, 75min@2 .8mph&0%incline METS 2.6       Oxygen    Oxygen  Continuous Continuous Continuous     Liters 2 2-3 2-3       Treadmill   MPH 2.8 -- 2.8     Grade 3.5 -- 3.5     Minutes 15 -- 15     METs 4.1 -- 4.1       Recumbant Elliptical   Level 5 5 5      Minutes 15 15 15      METs 3.3 3.6 4.6       Oxygen    Maintain Oxygen  Saturation 88% or higher 88% or higher 88% or higher        Exercise Comments:   Exercise Goals and Review:   Exercise Goals Re-Evaluation :  Exercise Goals Re-Evaluation  Row Name 02/13/23 1202 03/20/23 0903 04/12/23 1047 05/12/23 0939 06/09/23 0924     Exercise Goal Re-Evaluation   Exercise Goals Review Increase Physical Activity;Able to understand and use Dyspnea scale;Understanding of Exercise Prescription;Increase Strength and Stamina;Knowledge and understanding of Target Heart Rate Range (THRR);Able to understand and use rate of perceived exertion (RPE) scale Increase Physical Activity;Able to understand and use Dyspnea scale;Understanding of Exercise Prescription;Increase Strength and Stamina;Knowledge and understanding of Target Heart Rate Range (THRR);Able to understand and use rate of perceived exertion (RPE) scale Increase Physical Activity;Able to understand and use Dyspnea scale;Understanding of Exercise Prescription;Increase Strength and Stamina;Knowledge and understanding of Target Heart Rate Range (THRR);Able to understand and use rate of perceived exertion (RPE) scale Increase Physical Activity;Able to understand and use Dyspnea scale;Understanding of Exercise Prescription;Increase Strength and Stamina;Knowledge and understanding of Target Heart Rate Range (THRR);Able to understand and use rate of perceived exertion (RPE) scale Increase  Physical Activity;Able to understand and use Dyspnea scale;Understanding of Exercise Prescription;Increase Strength and Stamina;Knowledge and understanding of Target Heart Rate Range (THRR);Able to understand and use rate of perceived exertion (RPE) scale   Comments Hajra has completed 20 exercise sessions. She exercises for 15 min on the recumbent elliptical and treadmill. She averages 3.5 METs at level 4 on the recumbent elliptical and 2.9 METs on the treadmill. She performs the warmup and cooldown standing without limitations. Asees has increased her level on the recumbent elliptical. She tolerates this progression wel. Her treadmill speed has also slightly increased. I asked Labresha if she wanted to discuss home exercise. Suttyn feels she has all the resources she needs to exercise at home. Will continue to monitor and progress as able. Palmyra has completed 27 exercise sessions. She exercises for 15 min on the recumbent elliptical and treadmill. She averages 3.8 METs at level 4 on the recumbent elliptical and 3.6 METs on the treadmill. She performs the warmup and cooldown standing without limitations. Rehana has increased her speed and incline on the treadmill as her intervals have changed. Sabra does intervals on certain days. Will continue to monitor and progress as able. Joslynne has completed 33 exercise sessions. She exercises for 15 min on the recumbent elliptical and treadmill. She averages 3.3 METs at level 5 on the recumbent elliptical and 2.2-3.6 METs on the treadmill. She performs the warmup and cooldown standing without limitations. Sayla has increased her level on the recumbent elliptical as METs remain relatively the same. Her speed has increased in her high interval. She continue to tolerate progressions well. Will continue to monitor and progress as able. Bonne has completed 39 exercise sessions. She exercises for 15 min on the recumbent elliptical and treadmill. She averages 3.9 METs at level  5 on the recumbent elliptical and 1.8-2.3 METs on the treadmill. She performs the warmup and cooldown standing without limitations. Lourene has increased her level on the recumbent elliptical as METs have increased. Her intervals on the treadmill have remained the relatively the same. Will continue to monitor and progress as able. Pasty has completed 43 exercise sessions. She exercises for 15 min on the recumbent elliptical and treadmill. She averages 4.1 METs at level 5 on the recumbent elliptical and 1.8-2.3 METs on the treadmill. She performs the warmup and cooldown standing without limitations. Darthy has increased her level on the recumbent elliptical as METs have increased. She still tolerates progressions well. Karolyna has missed several sessions due to other appointments. Will continue to monitor and progress as able.   Expected Outcomes Through exercise  at rehab and home, the patient will decrease shortness of breath with daily activities and feel confident in carrying out an exercise regimen at home. Through exercise at rehab and home, the patient will decrease shortness of breath with daily activities and feel confident in carrying out an exercise regimen at home. Through exercise at rehab and home, the patient will decrease shortness of breath with daily activities and feel confident in carrying out an exercise regimen at home. Through exercise at rehab and home, the patient will decrease shortness of breath with daily activities and feel confident in carrying out an exercise regimen at home. Through exercise at rehab and home, the patient will decrease shortness of breath with daily activities and feel confident in carrying out an exercise regimen at home.    Row Name 07/06/23 9271 08/02/23 0921           Exercise Goal Re-Evaluation   Exercise Goals Review Increase Physical Activity;Able to understand and use Dyspnea scale;Understanding of Exercise Prescription;Increase Strength and  Stamina;Knowledge and understanding of Target Heart Rate Range (THRR);Able to understand and use rate of perceived exertion (RPE) scale Increase Physical Activity;Able to understand and use Dyspnea scale;Understanding of Exercise Prescription;Increase Strength and Stamina;Knowledge and understanding of Target Heart Rate Range (THRR);Able to understand and use rate of perceived exertion (RPE) scale      Comments Breyanna has completed 49 exercise sessions. She exercises for 15 min on the recumbent elliptical and treadmill. She averages 4.1 METs at level 5 on the recumbent elliptical and 2.6-4.1 METs on the treadmill. She performs the warmup and cooldown standing without limitations. Isabellah has increased her speed  and incline intervals on the treadmill. She tolerates this well thus far. Will continue to monitor and progress as able. Rayven has completed 55 exercise sessions. She exercises for 15 min on the recumbent elliptical and treadmill. She averages 4.6 METs at level 5 on the recumbent elliptical and 2.6-4.1 METs on the treadmill. She performs the warmup and cooldown standing without limitations. Kalena's METs have slightly increased on the recumbent elliptical. Will continue to monitor and progress as able.      Expected Outcomes Through exercise at rehab and home, the patient will decrease shortness of breath with daily activities and feel confident in carrying out an exercise regimen at home. Through exercise at rehab and home, the patient will decrease shortness of breath with daily activities and feel confident in carrying out an exercise regimen at home.         Discharge Exercise Prescription (Final Exercise Prescription Changes):  Exercise Prescription Changes - 08/01/23 1204       Response to Exercise   Blood Pressure (Admit) 98/70    Blood Pressure (Exit) 94/60    Heart Rate (Admit) 67 bpm    Heart Rate (Exercise) 109 bpm    Heart Rate (Exit) 79 bpm    Oxygen  Saturation (Admit) 97 %     Oxygen  Saturation (Exercise) 93 %    Oxygen  Saturation (Exit) 97 %    Rating of Perceived Exertion (Exercise) 13    Perceived Dyspnea (Exercise) 2    Duration Continue with 30 min of aerobic exercise without signs/symptoms of physical distress.    Intensity THRR unchanged      Progression   Progression Continue to progress workloads to maintain intensity without signs/symptoms of physical distress.      Resistance Training   Training Prescription Yes    Weight black bands    Reps 10-15  Time 10 Minutes      Interval Training   Interval Training Yes    Equipment Treadmill    Comments 15min@2 .68mph&3.5%incline METS 4.1, 74min@2 .42mph&0%incline METS 2.6      Oxygen    Oxygen  Continuous    Liters 2-3      Treadmill   MPH 2.8    Grade 3.5    Minutes 15    METs 4.1      Recumbant Elliptical   Level 5    Minutes 15    METs 4.6      Oxygen    Maintain Oxygen  Saturation 88% or higher          Nutrition:  Target Goals: Understanding of nutrition guidelines, daily intake of sodium 1500mg , cholesterol 200mg , calories 30% from fat and 7% or less from saturated fats, daily to have 5 or more servings of fruits and vegetables.  Biometrics:    Nutrition Therapy Plan and Nutrition Goals:  Nutrition Therapy & Goals - 07/11/23 1033       Nutrition Therapy   Diet General Healthy Diet      Personal Nutrition Goals   Nutrition Goal Patient to improve dietary quality by using the plate method as a daily guide for meal planning to include lean protein/plant protein, fruits, vegetables, whole grains, and nonfat/low fat dairy as part of well balanced diet   goal in progress.   Personal Goal #2 Patient to identify strategies for weight loss of 0.5-2.0# per week of weight loss.   goal in progress.   Comments Goals in progress.  Jaidalyn has medical history of pulmonary HTN, COPD3, chronic respiratory failure. Per [facility] transplant RD documentation on 10/05/22, it is recommended that she lose  weight to BMI 30/170# and ultimately BMI 27/150#. She reports following a high protein diet, tracking calories ~1400kcals/day.  She continues to work on weight loss by following a high protein/high fiber diet and tracking intake. She does acknowledge some emotional eating/binge type eating tendencies; she has started seeing behavioral health counselor. Answered patient questions about constipation, fiber ,etc. She is down 8.5 since highest weight starting pulmonary rehab at 80.7kg. LDL is elevated and A1c is in a prediabetic range. Wynetta would continue to benefit from weight loss and decrease intake of refined carbohydrates to support pulmonary disease.      Intervention Plan   Intervention Prescribe, educate and counsel regarding individualized specific dietary modifications aiming towards targeted core components such as weight, hypertension, lipid management, diabetes, heart failure and other comorbidities.;Nutrition handout(s) given to patient.    Expected Outcomes Short Term Goal: Understand basic principles of dietary content, such as calories, fat, sodium, cholesterol and nutrients.;Long Term Goal: Adherence to prescribed nutrition plan.          Nutrition Assessments:  MEDIFICTS Score Key: >=70 Need to make dietary changes  40-70 Heart Healthy Diet <= 40 Therapeutic Level Cholesterol Diet  Flowsheet Row PULMONARY REHAB CHRONIC OBSTRUCTIVE PULMONARY DISEASE from 02/22/2022 in [facility]  Picture Your Plate Total Score on Discharge 50   Picture Your Plate Scores: <59 Unhealthy dietary pattern with much room for improvement. 41-50 Dietary pattern unlikely to meet recommendations for good health and room for improvement. 51-60 More healthful dietary pattern, with some room for improvement.  >60 Healthy dietary pattern, although there may be some specific behaviors that could be improved.    Nutrition Goals Re-Evaluation:   Nutrition Goals Re-Evaluation     Row Name 03/13/23 1439 04/11/23  1129 05/09/23 1109 06/08/23 1006 07/11/23 1033     Goals   Current Weight 178 lb 5.6 oz (80.9 kg) 176 lb 12.9 oz (80.2 kg) 174 lb 2.6 oz (79 kg) 173 lb 1 oz (78.5 kg) 169 lb 5 oz (76.8 kg)   Comment A1c 5.9; other most recent labs LDL 134, cholesterol 208 A1c 5.9; other most recent labs LDL 134, cholesterol 208 no new labs; most recent labs A1c 5.9,LDL 134, cholesterol 208 total cholesterol 222, LDL 139, A1c 5.9 total cholesterol 222, LDL 139, A1c 5.9   Expected Outcome Goals in progress. Weight loss goal not met. Susana has medical history of pulmonary HTN, COPD3, chronic respiratory failure. Per [facility] transplant RD documentation on 10/05/22, it is recommended that she lose weight to BMI 30/170# and ultimately BMI 27/150#. She does report history of over snacking on refined carbohydates, snacking in the middle of the night, etc. She has previously completed pulmonary rehab in February 2024; her starting weight at that time was 69.8kg/153.6#. She has maintained weight over the last ~5 months. She has previously used My[employer]itness Pal app to aid with tracking calories. She has maintained her weight since starting with our program. She does acknowledge some emotional eating/binge type eating tendencies; she has started seeing behavioral health counselor. Misako would continue to benefit from weight loss and decrease intake of refined carbohydrates to support pulmonary disease. Goals in progress. Weight loss goal not met; she has maintained her weight. Lekisha has medical history of pulmonary HTN, COPD3, chronic respiratory failure. Per [facility] transplant RD documentation on 10/05/22, it is recommended that she lose weight to BMI 30/170# and ultimately BMI 27/150#. She does report history of over snacking on refined carbohydates, snacking in the middle of the night, etc. She has previously completed pulmonary rehab in February 2024; her starting weight at  that time was 69.8kg/153.6#. She has maintained weight over the last ~6 months. She has previously used [employer] app to aid with tracking calories. She has maintained her weight since starting with our program. She does acknowledge some emotional eating/binge type eating tendencies; she has started seeing behavioral health counselor. Luvina would continue to benefit from weight loss and decrease intake of refined carbohydrates to support pulmonary disease. Goals in progress. Imogene has medical history of pulmonary HTN, COPD3, chronic respiratory failure. Per [facility] transplant RD documentation on 10/05/22, it is recommended that she lose weight to BMI 30/170# and ultimately BMI 27/150#. She reports following a high protein diet, tracking calories ~1400kcals/day. She has previously completed pulmonary rehab in February 2024; her starting weight at that time was 69.8kg/153.6#. She is down 3.7# since starting pulmonary rehab this time. She does acknowledge some emotional eating/binge type eating tendencies; she has started seeing behavioral health counselor. Answered patient questions about constipation, fiber ,etc. Jaleeyah would continue to benefit from weight loss and decrease intake of refined carbohydrates to support pulmonary disease. Goals in progress. Miarose has medical history of pulmonary HTN, COPD3, chronic respiratory failure. Per [facility] transplant RD documentation on 10/05/22, it is recommended that she lose weight to BMI 30/170# and ultimately BMI 27/150#. She reports following a high protein diet, tracking calories ~1400kcals/day. She has previously completed pulmonary rehab in February 2024; her starting weight at that time was 69.8kg/153.6#. She continues to work on weight loss by following a high protein/high fiber diet and tracking intake. She does acknowledge some emotional eating/binge type eating tendencies; she has started seeing behavioral health counselor. Answered patient questions about  constipation, fiber ,etc.  LDL is elevated and A1c is in a prediabetic range. Azariyah would continue to benefit from weight loss and decrease intake of refined carbohydrates to support pulmonary disease. Goals in progress. Valina has medical history of pulmonary HTN, COPD3, chronic respiratory failure. Per [facility] transplant RD documentation on 10/05/22, it is recommended that she lose weight to BMI 30/170# and ultimately BMI 27/150#. She reports following a high protein diet, tracking calories ~1400kcals/day. She continues to work on weight loss by following a high protein/high fiber diet and tracking intake. She does acknowledge some emotional eating/binge type eating tendencies; she has started seeing behavioral health counselor. Answered patient questions about constipation, fiber ,etc. She is down 8.5 since highest weight starting pulmonary rehab at 80.7kg. LDL is elevated and A1c is in a prediabetic range. Gwynn would continue to benefit from weight loss and decrease intake of refined carbohydrates to support pulmonary disease.      Nutrition Goals Discharge (Final Nutrition Goals Re-Evaluation):  Nutrition Goals Re-Evaluation - 07/11/23 1033       Goals   Current Weight 169 lb 5 oz (76.8 kg)    Comment total cholesterol 222, LDL 139, A1c 5.9    Expected Outcome Goals in progress. Iwalani has medical history of pulmonary HTN, COPD3, chronic respiratory failure. Per [facility] transplant RD documentation on 10/05/22, it is recommended that she lose weight to BMI 30/170# and ultimately BMI 27/150#. She reports following a high protein diet, tracking calories ~1400kcals/day. She continues to work on weight loss by following a high protein/high fiber diet and tracking intake. She does acknowledge some emotional eating/binge type eating tendencies; she has started seeing behavioral health counselor. Answered patient questions about constipation, fiber ,etc. She is down 8.5 since highest weight starting pulmonary  rehab at 80.7kg. LDL is elevated and A1c is in a prediabetic range. Katlin would continue to benefit from weight loss and decrease intake of refined carbohydrates to support pulmonary disease.          Psychosocial: Target Goals: Acknowledge presence or absence of significant depression and/or stress, maximize coping skills, provide positive support system. Participant is able to verbalize types and ability to use techniques and skills needed for reducing stress and depression.  Initial Review & Psychosocial Screening:   Quality of Life Scores:  Scores of 19 and below usually indicate a poorer quality of life in these areas.  A difference of  2-3 points is a clinically meaningful difference.  A difference of 2-3 points in the total score of the Quality of Life Index has been associated with significant improvement in overall quality of life, self-image, physical symptoms, and general health in studies assessing change in quality of life.  PHQ-9: Review Flowsheet  More data exists      07/25/2023 06/22/2023 06/07/2023 03/29/2023 03/16/2023  Depression screen PHQ 2/9  Decreased Interest 0      Down, Depressed, Hopeless 0      PHQ - 2 Score 0      Altered sleeping 1      Tired, decreased energy 1      Change in appetite 0      Feeling bad or failure about yourself  1      Trouble concentrating 0      Moving slowly or fidgety/restless 0      Suicidal thoughts 0      PHQ-9 Score 3      Difficult doing work/chores Not difficult at all        Details  Information is confidential and restricted. Go to Review Flowsheets to unlock data.        Interpretation of Total Score  Total Score Depression Severity:  1-4 = Minimal depression, 5-9 = Mild depression, 10-14 = Moderate depression, 15-19 = Moderately severe depression, 20-27 = Severe depression   Psychosocial Evaluation and Intervention:   Psychosocial Re-Evaluation:  Psychosocial Re-Evaluation     Row Name 02/15/23 1034  03/15/23 1118 04/10/23 1429 05/10/23 0845 06/09/23 9173     Psychosocial Re-Evaluation   Current issues with Current Depression;History of Depression;Current Psychotropic Meds;Current Anxiety/Panic;Current Stress Concerns Current Depression;History of Depression;Current Psychotropic Meds;Current Stress Concerns Current Depression;History of Depression;Current Psychotropic Meds;Current Stress Concerns Current Depression;History of Depression;Current Psychotropic Meds;Current Stress Concerns Current Depression;History of Depression;Current Psychotropic Meds;Current Stress Concerns   Comments 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 She felt the risk outweighed the benefits. She has peace about this decision. She is going to go through with the lung transplant evaluation which she is hopeful about. She feels her depression is stable at this time. She is compliant with taking her psychotropic meds and meeting with her mental health specialist.  No new barriers or concerns at this time. Mikael denies any new psychosocial barriers or concerns at this time. She is still working with her therapist and taking her psychotropic meds. Kelcee denies any new psychosocial barriers or concerns at this time. She is still working with her therapist and taking her psychotropic meds. Marquite denies any new psychosocial barriers or concerns at this time. She has made some friends in class and this has seem to brighten her spirits. She also has been losing weight which has helped her mental health. She is compliant with taking her psychotropic meds and is still working with her mental health specialist. Leda is currently dealing with new stress and anxiety over recent heart issues. She is still working with her therapist and is compliant with taking her psychotropic meds. She denies any needs at this time.   Expected Outcomes For Jearline to attend the program with less psychosocial  issues or concerns. For Rodnisha to attend the program with less psychosocial issues or concerns. For Leylanie to attend the program with less psychosocial issues or concerns. For Zayda to attend the program with less psychosocial issues or concerns. For Cinthya to attend the program with less psychosocial issues or concerns.   Interventions Encouraged to attend Pulmonary Rehabilitation for the exercise Encouraged to attend Pulmonary Rehabilitation for the exercise Encouraged to attend Pulmonary Rehabilitation for the exercise Encouraged to attend Pulmonary Rehabilitation for the exercise --   Continue Psychosocial Services  Follow up required by staff No Follow up required No Follow up required No Follow up required No Follow up required    Row Name 06/29/23 0844 08/02/23 1303           Psychosocial Re-Evaluation   Current issues with Current Psychotropic Meds;History of Depression Current Psychotropic Meds;History of Depression      Comments Marylan denies any new psychosocial barriers or concerns at this time. She is compliant with taking her psychotropic meds and continues to work with her therapist. Howard denies any new psychosocial barriers or concerns at this time. She is compliant with taking her psychotropic meds and continues to work with her therapist.      Expected Outcomes For Tida to attend the program with less psychosocial issues or concerns. For Analysse to attend the program with less psychosocial issues or concerns.  Interventions Encouraged to attend Pulmonary Rehabilitation for the exercise Encouraged to attend Pulmonary Rehabilitation for the exercise      Continue Psychosocial Services  No Follow up required No Follow up required         Psychosocial Discharge (Final Psychosocial Re-Evaluation):  Psychosocial Re-Evaluation - 08/02/23 1303       Psychosocial Re-Evaluation   Current issues with Current Psychotropic Meds;History of Depression    Comments Shalon denies any  new psychosocial barriers or concerns at this time. She is compliant with taking her psychotropic meds and continues to work with her therapist.    Expected Outcomes For Ulah to attend the program with less psychosocial issues or concerns.    Interventions Encouraged to attend Pulmonary Rehabilitation for the exercise    Continue Psychosocial Services  No Follow up required          Education: Education Goals: Education classes will be provided on a weekly basis, covering required topics. Participant will state understanding/return demonstration of topics presented.  Learning Barriers/Preferences:   Education Topics: Know Your Numbers Group instruction that is supported by a PowerPoint presentation. Instructor discusses importance of knowing and understanding resting, exercise, and post-exercise oxygen  saturation, heart rate, and blood pressure. Oxygen  saturation, heart rate, blood pressure, rating of perceived exertion, and dyspnea are reviewed along with a normal range for these values.  Flowsheet Row PULMONARY REHAB CHRONIC OBSTRUCTIVE PULMONARY DISEASE from 06/29/2023 in [facility]  Date 06/29/23  Educator EP  Instruction Review Code 1- Verbalizes Understanding    Exercise for the Pulmonary Patient Group instruction that is supported by a PowerPoint presentation. Instructor discusses benefits of exercise, core components of exercise, frequency, duration, and intensity of an exercise routine, importance of utilizing pulse oximetry during exercise, safety while exercising, and options of places to exercise outside of rehab.  Flowsheet Row PULMONARY REHAB CHRONIC OBSTRUCTIVE PULMONARY DISEASE from 06/22/2023 in [facility] for Heart, Vascular, & Lung Health  Date 06/22/23  Educator EP  Instruction Review Code 1- Verbalizes Understanding    MET Level  Group instruction provided by PowerPoint, verbal  discussion, and written material to support subject matter. Instructor reviews what METs are and how to increase METs.  Flowsheet Row PULMONARY REHAB CHRONIC OBSTRUCTIVE PULMONARY DISEASE from 05/25/2023 in [facility] for Heart, Vascular, & Lung Health  Date 05/25/23  Educator EP  Instruction Review Code 1- Verbalizes Understanding    Pulmonary Medications Verbally interactive group education provided by instructor with focus on inhaled medications and proper administration. Flowsheet Row PULMONARY REHAB CHRONIC OBSTRUCTIVE PULMONARY DISEASE from 06/15/2023 in [facility] for Heart, Vascular, & Lung Health  Date 06/15/23  Educator RT  Instruction Review Code 1- Verbalizes Understanding    Anatomy and Physiology of the Respiratory System Group instruction provided by PowerPoint, verbal discussion, and written material to support subject matter. Instructor reviews respiratory cycle and anatomical components of the respiratory system and their functions. Instructor also reviews differences in obstructive and restrictive respiratory diseases with examples of each.  Flowsheet Row PULMONARY REHAB CHRONIC OBSTRUCTIVE PULMONARY DISEASE from 12/15/2022 in [facility] for Heart, Vascular, & Lung Health  Date 12/15/22  Educator RT  Instruction Review Code 1- Verbalizes Understanding    Oxygen  Safety Group instruction provided by PowerPoint, verbal discussion, and written material to support subject matter. There is an overview of "What is Oxygen " and "Why do we need  it".  Instructor also reviews how to create a safe environment for oxygen  use, the importance of using oxygen  as prescribed, and the risks of noncompliance. There is a brief discussion on traveling with oxygen  and resources the patient may utilize. Flowsheet Row PULMONARY REHAB CHRONIC OBSTRUCTIVE PULMONARY DISEASE from 07/06/2023 in [facility] for  Heart, Vascular, & Lung Health  Date 07/06/23  Educator RN  Instruction Review Code 1- Verbalizes Understanding    Oxygen  Use Group instruction provided by PowerPoint, verbal discussion, and written material to discuss how supplemental oxygen  is prescribed and different types of oxygen  supply systems. Resources for more information are provided.  Flowsheet Row PULMONARY REHAB CHRONIC OBSTRUCTIVE PULMONARY DISEASE from 01/19/2023 in [facility] for Heart, Vascular, & Lung Health  Date 01/19/23  Educator RT  Instruction Review Code 1- Verbalizes Understanding    Breathing Techniques Group instruction that is supported by demonstration and informational handouts. Instructor discusses the benefits of pursed lip and diaphragmatic breathing and detailed demonstration on how to perform both.  Flowsheet Row PULMONARY REHAB CHRONIC OBSTRUCTIVE PULMONARY DISEASE from 07/20/2023 in [facility] for Heart, Vascular, & Lung Health  Date 07/20/23  Educator RN  Instruction Review Code 1- Verbalizes Understanding     Risk Factor Reduction Group instruction that is supported by a PowerPoint presentation. Instructor discusses the definition of a risk factor, different risk factors for pulmonary disease, and how the heart and lungs work together. Flowsheet Row PULMONARY REHAB CHRONIC OBSTRUCTIVE PULMONARY DISEASE from 05/18/2023 in [facility] for Heart, Vascular, & Lung Health  Date 05/18/23  Educator EP  Instruction Review Code 1- Verbalizes Understanding    Pulmonary Diseases Group instruction provided by PowerPoint, verbal discussion, and written material to support subject matter. Instructor gives an overview of the different type of pulmonary diseases. There is also a discussion on risk factors and symptoms as well as ways to manage the diseases. Flowsheet Row PULMONARY REHAB CHRONIC OBSTRUCTIVE PULMONARY DISEASE from 03/23/2023 in  [facility] for Heart, Vascular, & Lung Health  Date 03/23/23  Educator RT  Instruction Review Code 1- Verbalizes Understanding    Stress and Energy Conservation Group instruction provided by PowerPoint, verbal discussion, and written material to support subject matter. Instructor gives an overview of stress and the impact it can have on the body. Instructor also reviews ways to reduce stress. There is also a discussion on energy conservation and ways to conserve energy throughout the day. Flowsheet Row PULMONARY REHAB CHRONIC OBSTRUCTIVE PULMONARY DISEASE from 07/27/2023 in [facility] for Heart, Vascular, & Lung Health  Date 07/27/23  Educator RN  Instruction Review Code 1- Verbalizes Understanding    Warning Signs and Symptoms Group instruction provided by PowerPoint, verbal discussion, and written material to support subject matter. Instructor reviews warning signs and symptoms of stroke, heart attack, cold and flu. Instructor also reviews ways to prevent the spread of infection. Flowsheet Row PULMONARY REHAB CHRONIC OBSTRUCTIVE PULMONARY DISEASE from 05/11/2023 in [facility] for Heart, Vascular, & Lung Health  Date 05/11/23  Educator RN  Instruction Review Code 1- Verbalizes Understanding    Other Education Group or individual verbal, written, or video instructions that support the educational goals of the pulmonary rehab program. Flowsheet Row PULMONARY REHAB CHRONIC OBSTRUCTIVE PULMONARY DISEASE from 03/16/2023 in [facility] for Heart, Vascular, & Lung Health  Date 03/16/23  Educator EP  Instruction  Review Code 1- Verbalizes Understanding     Knowledge Questionnaire Score:  Knowledge Questionnaire Score - 07/25/23 1634       Knowledge Questionnaire Score   Post Score 18/18          Core Components/Risk Factors/Patient Goals at Admission:   Core Components/Risk  Factors/Patient Goals Review:   Goals and Risk Factor Review     Row Name 02/15/23 1037 03/15/23 1120 04/10/23 1430 05/10/23 0848 06/09/23 0904     Core Components/Risk Factors/Patient Goals Review   Personal Goals Review Weight Management/Obesity;Improve shortness of breath with ADL's;Stress Weight Management/Obesity;Improve shortness of breath with ADL's;Stress Weight Management/Obesity;Improve shortness of breath with ADL's Weight Management/Obesity;Improve shortness of breath with ADL's Weight Management/Obesity;Improve shortness of breath with ADL's   Review Goal progressing for weight loss. Goal progressing on improving shortness of breath with ADL's. Goal progressing on decreasing stress.  Latesha is requiring 2L of O2 to maintain sats >88% while exercising. She is currently exercising on the recumbant elliptical and the treadmill. We will continue to monitor Tamber's progress throughout the program. Goal progressing for weight loss. Andora is working with the staff dietician to obatain her weight loss goals. Goal progressing on improving shortness of breath with ADL's. Goal met on decreasing stress. She states that exercising has helped reduce her stress and she feels like her stress level is under control. Deshanna is requiring 2L of O2 to maintain sats >88% while exercising. She is currently exercising on the recumbant elliptical and the treadmill. We will continue to monitor Elma's progress throughout the program. Monthly review of patient's Core Components/Risk Factors/Patient Goals are as follows: Goal progressing for weight loss. Roben is working with the staff dietitian to obatain her weight loss goals. Goal progressing on improving shortness of breath with ADL's. Yalonda is requiring 2L of O2 to maintain sats >88% while exercising. She is currently exercising on the recumbant elliptical and the treadmill. We will continue to monitor Kitzia's progress throughout the program. Monthly review of  patient's Core Components/Risk Factors/Patient Goals are as follows: Goal progressing for weight loss. Jakeya has been applying the education she has receved from the dietitician as well as using weight watchers to help facilitate weight loss. Goal progressing on improving shortness of breath with ADL's. Korayma is requiring 2L of O2 to maintain sats >88% while exercising. She is currently exercising on the recumbant elliptical and the treadmill. We will continue to monitor Vineta's progress throughout the program. Monthly review of patient's Core Components/Risk Factors/Patient Goals are as follows: Goal progressing for weight loss. Jhoselin has been applying the education she has received from the dietitician as well as using weight watchers to help facilitate weight loss. Goal progressing on improving shortness of breath with ADL's. Vonetta is requiring 2L of O2 to maintain sats >88% while exercising. She is currently exercising on the recumbant elliptical and the treadmill. We will continue to monitor Azarah's progress throughout the program.   Expected Outcomes For Laiyla to lose weight, improve her SOB with ADLs and decrease stress For Eriyah to lose weight and improve her SOB with ADLs For Pristine to lose weight and improve her SOB with ADLs For Jahmiyah to lose weight and improve her SOB with ADLs For Mehak to lose weight and improve her SOB with ADLs    Row Name 06/29/23 9154 08/02/23 1303           Core Components/Risk Factors/Patient Goals Review   Personal Goals Review Weight Management/Obesity;Improve shortness of breath with ADL's Weight  Management/Obesity;Improve shortness of breath with ADL's      Review Monthly review of patient's Core Components/Risk Factors/Patient Goals are as follows: Goal progressing for weight loss. Patrena has been applying the education she has received from the dietitician as well as using weight watchers to help facilitate weight loss. Goal progressing on improving  shortness of breath with ADL's. Madalin is requiring 2L of O2 to maintain sats >88% while exercising. She is currently exercising on the recumbant elliptical and the treadmill. We will continue to monitor Anaka's progress throughout the program. Monthly review of patient's Core Components/Risk Factors/Patient Goals are as follows: Goal progressing for weight loss. Beyza has been applying the education she has received from the dietitician as well as using weight watchers to help facilitate weight loss. Goal progressing on improving shortness of breath with ADL's. Shela is requiring 2-3L of O2 to maintain sats >88% while exercising. She is currently exercising on the recumbant elliptical and the treadmill. We will continue to monitor Keosha's progress throughout the program.      Expected Outcomes For Sadye to lose weight and improve her SOB with ADLs For Lorrain to lose weight and improve her SOB with ADLs         Core Components/Risk Factors/Patient Goals at Discharge (Final Review):   Goals and Risk Factor Review - 08/02/23 1303       Core Components/Risk Factors/Patient Goals Review   Personal Goals Review Weight Management/Obesity;Improve shortness of breath with ADL's    Review Monthly review of patient's Core Components/Risk Factors/Patient Goals are as follows: Goal progressing for weight loss. Dayle has been applying the education she has received from the dietitician as well as using weight watchers to help facilitate weight loss. Goal progressing on improving shortness of breath with ADL's. Saanvi is requiring 2-3L of O2 to maintain sats >88% while exercising. She is currently exercising on the recumbant elliptical and the treadmill. We will continue to monitor Asmi's progress throughout the program.    Expected Outcomes For Naamah to lose weight and improve her SOB with ADLs          ITP Comments:Pt is making expected progress toward Pulmonary Rehab goals after completing 55  session(s). Recommend continued exercise, life style modification, education, and utilization of breathing techniques to increase stamina and strength, while also decreasing shortness of breath with exertion.  Dr. Slater Staff is Medical Director for Pulmonary Rehab at [facility].

## 2023-08-10 ENCOUNTER — Telehealth (HOSPITAL_COMMUNITY): Payer: Self-pay

## 2023-08-10 ENCOUNTER — Encounter (HOSPITAL_COMMUNITY): Admission: RE | Admit: 2023-08-10 | Source: Ambulatory Visit

## 2023-08-10 NOTE — Telephone Encounter (Signed)
 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

## 2023-08-14 ENCOUNTER — Other Ambulatory Visit: Payer: Self-pay | Admitting: Internal Medicine

## 2023-08-14 DIAGNOSIS — J301 Allergic rhinitis due to pollen: Secondary | ICD-10-CM

## 2023-08-15 ENCOUNTER — Encounter (HOSPITAL_COMMUNITY)
Admission: RE | Admit: 2023-08-15 | Discharge: 2023-08-15 | Disposition: A | Discharge status: Home / Self Care | Source: Ambulatory Visit | Attending: Internal Medicine | Admitting: Internal Medicine

## 2023-08-15 DIAGNOSIS — J449 Chronic obstructive pulmonary disease, unspecified: Secondary | ICD-10-CM | POA: Insufficient documentation

## 2023-08-15 DIAGNOSIS — R079 Chest pain, unspecified: Secondary | ICD-10-CM | POA: Insufficient documentation

## 2023-08-15 NOTE — Progress Notes (Signed)
 Daily Session Note  Patient Details  Name: Jill Glenn MRN: [id number] Date of Birth: 08-30-1965 Referring Provider:   Conrad Ports Pulmonary Rehab Walk Test from 10/26/2022 in [facility] for Heart, Vascular, & Lung Health  Referring Provider Meade    Encounter Date: 08/15/2023  Check In:  Session Check In - 08/15/23 0834       Check-In   Supervising physician immediately available to respond to emergencies [facility] MD immediately available    Physician(s) Rosabel Mose, NP    Location [facility]    Staff Present Johnnie Moats, MS, ACSM-CEP, Exercise Physiologist;Casey Claudene Unknown Candy, MS, Exercise Physiologist    Virtual Visit No    Medication changes reported     No    Fall or balance concerns reported    No    Tobacco Cessation No Change    Warm-up and Cool-down Performed as group-led instruction    Resistance Training Performed Yes    VAD Patient? No    PAD/SET Patient? No      Pain Assessment   Currently in Pain? No/denies    Pain Score 0-No pain    Multiple Pain Sites No          Capillary Blood Glucose: No results found for this or any previous visit (from the past 24 hours).    Social History   Tobacco Use  Smoking Status Former   Current packs/day: 0.00   Average packs/day: 1 pack/day for 40.6 years (40.6 ttl pk-yrs)   Types: Cigarettes   Start date: 03/05/1981   Quit date: 10/14/2021   Years since quitting: 1.8  Smokeless Tobacco Never    Goals Met:  Proper associated with RPD/PD & O2 Sat Independence with exercise equipment Exercise tolerated well No report of concerns or symptoms today Strength training completed today  Goals Unmet:  Not Applicable  Comments: Service time is from 0801 to 0910.    Dr. Slater Staff is Medical Director for Pulmonary Rehab at [facility].

## 2023-08-17 ENCOUNTER — Encounter (HOSPITAL_COMMUNITY)
Admission: RE | Admit: 2023-08-17 | Discharge: 2023-08-17 | Disposition: A | Discharge status: Home / Self Care | Source: Ambulatory Visit | Attending: Internal Medicine | Admitting: Internal Medicine

## 2023-08-17 DIAGNOSIS — J449 Chronic obstructive pulmonary disease, unspecified: Secondary | ICD-10-CM

## 2023-08-17 NOTE — Progress Notes (Signed)
 Daily Session Note  Patient Details  Name: Jill Glenn MRN: [id number] Date of Birth: 10-22-1965 Referring Provider:   Conrad Ports Pulmonary Rehab Walk Test from 10/26/2022 in [facility] for Heart, Vascular, & Lung Health  Referring Provider Meade    Encounter Date: 08/17/2023  Check In:  Session Check In - 08/17/23 0818       Check-In   Supervising physician immediately available to respond to emergencies [facility] MD immediately available    Physician(s) Damien Braver, NP    Location [facility]    Staff Present Johnnie Moats, MS, ACSM-CEP, Exercise Physiologist;Ulysse Siemen Claudene Candia Levin, RN, BSN    Virtual Visit No    Medication changes reported     No    Fall or balance concerns reported    No    Tobacco Cessation No Change    Warm-up and Cool-down Performed as group-led instruction    Resistance Training Performed Yes    VAD Patient? No    PAD/SET Patient? No      Pain Assessment   Currently in Pain? No/denies    Multiple Pain Sites No          Capillary Blood Glucose: No results found for this or any previous visit (from the past 24 hours).    Social History   Tobacco Use  Smoking Status Former   Current packs/day: 0.00   Average packs/day: 1 pack/day for 40.6 years (40.6 ttl pk-yrs)   Types: Cigarettes   Start date: 03/05/1981   Quit date: 10/14/2021   Years since quitting: 1.8  Smokeless Tobacco Never    Goals Met:  Proper associated with RPD/PD & O2 Sat Independence with exercise equipment Exercise tolerated well No report of concerns or symptoms today Strength training completed today  Goals Unmet:  Not Applicable  Comments: Service time is from 0805 to 0935.    Dr. Slater Staff is Medical Director for Pulmonary Rehab at [facility].

## 2023-08-22 ENCOUNTER — Encounter (HOSPITAL_COMMUNITY)
Admission: RE | Admit: 2023-08-22 | Discharge: 2023-08-22 | Disposition: A | Discharge status: Home / Self Care | Source: Ambulatory Visit | Attending: Internal Medicine | Admitting: Internal Medicine

## 2023-08-22 VITALS — Wt 169.1 lb

## 2023-08-22 DIAGNOSIS — J449 Chronic obstructive pulmonary disease, unspecified: Secondary | ICD-10-CM

## 2023-08-22 DIAGNOSIS — R079 Chest pain, unspecified: Secondary | ICD-10-CM

## 2023-08-22 NOTE — Progress Notes (Signed)
 Daily Session Note  Patient Details  Name: Jill Glenn MRN: [id number] Date of Birth: 06/27/1965 Referring Provider:   Conrad Ports Pulmonary Rehab Walk Test from 10/26/2022 in [facility] for Heart, Vascular, & Lung Health  Referring Provider Meade    Encounter Date: 08/22/2023  Check In:  Session Check In - 08/22/23 9188       Check-In   Supervising physician immediately available to respond to emergencies [facility] MD immediately available    Physician(s) Lum Organ, NP    Location [facility]    Staff Present Johnnie Moats, MS, ACSM-CEP, Exercise Physiologist;Casey Claudene Candia Levin, RN, BSN;Johnny Porter, MS, Exercise Physiologist    Virtual Visit No    Medication changes reported     No    Fall or balance concerns reported    No    Tobacco Cessation No Change    Warm-up and Cool-down Performed as group-led instruction    Resistance Training Performed Yes    VAD Patient? No    PAD/SET Patient? No      Pain Assessment   Currently in Pain? No/denies    Pain Score 0-No pain    Multiple Pain Sites No          Capillary Blood Glucose: No results found for this or any previous visit (from the past 24 hours).   Exercise Prescription Changes - 08/22/23 0800       Response to Exercise   Blood Pressure (Admit) 106/58    Blood Pressure (Exercise) 126/60    Blood Pressure (Exit) 100/60    Heart Rate (Admit) 63 bpm    Heart Rate (Exercise) 93 bpm    Heart Rate (Exit) 88 bpm    Oxygen  Saturation (Admit) 96 %   2L   Oxygen  Saturation (Exercise) 94 %   3L   Oxygen  Saturation (Exit) 98 %   3L   Rating of Perceived Exertion (Exercise) 13    Perceived Dyspnea (Exercise) 2    Duration Continue with 30 min of aerobic exercise without signs/symptoms of physical distress.    Intensity THRR unchanged      Progression   Progression Continue to progress workloads to maintain intensity without signs/symptoms of physical  distress.      Resistance Training   Training Prescription Yes    Weight black bands    Reps 10-15    Time 10 Minutes      Interval Training   Interval Training Yes    Equipment Treadmill    Comments 26min@2 .58mph&3.5%incline METS 4.1, 43min@2 .14mph&0%incline METS 2.6      Oxygen    Oxygen  Continuous    Liters 2-3      Treadmill   MPH 2.8    Grade 3.5    Minutes 15    METs 4.1      Recumbant Elliptical   Level 5    RPM 55    Watts 77    Minutes 15    METs 3.8      Oxygen    Maintain Oxygen  Saturation 88% or higher          Social History   Tobacco Use  Smoking Status Former   Current packs/day: 0.00   Average packs/day: 1 pack/day for 40.6 years (40.6 ttl pk-yrs)   Types: Cigarettes   Start date: 03/05/1981   Quit date: 10/14/2021   Years since quitting: 1.8  Smokeless Tobacco Never    Goals Met:  Independence with exercise equipment Exercise tolerated well  No report of concerns or symptoms today Strength training completed today  Goals Unmet:  Not Applicable  Comments: Service time is from 0759 to 0907    Dr. Slater Staff is Medical Director for Pulmonary Rehab at [facility].

## 2023-08-24 ENCOUNTER — Encounter (HOSPITAL_COMMUNITY)
Admission: RE | Admit: 2023-08-24 | Discharge: 2023-08-24 | Disposition: A | Discharge status: Home / Self Care | Source: Ambulatory Visit | Attending: Internal Medicine | Admitting: Internal Medicine

## 2023-08-24 ENCOUNTER — Ambulatory Visit (HOSPITAL_COMMUNITY): Admitting: Licensed Clinical Social Worker

## 2023-08-24 DIAGNOSIS — J449 Chronic obstructive pulmonary disease, unspecified: Secondary | ICD-10-CM | POA: Diagnosis not present

## 2023-08-24 DIAGNOSIS — R079 Chest pain, unspecified: Secondary | ICD-10-CM | POA: Diagnosis not present

## 2023-08-24 DIAGNOSIS — F411 Generalized anxiety disorder: Secondary | ICD-10-CM

## 2023-08-24 DIAGNOSIS — F331 Major depressive disorder, recurrent, moderate: Secondary | ICD-10-CM

## 2023-08-24 NOTE — Progress Notes (Signed)
 THERAPIST PROGRESS NOTE   Session Date: 08/24/2023  Session Time: 1507 - 1606  Participation Level: Active  Behavioral Response: CasualAlertDepressed and Euthymic  Type of Therapy: Individual Therapy  Treatment Goals addressed:   Progressing (4) LTG: Reduce frequency, intensity, and duration of depression symptoms so that daily functioning is improved (OP Depression) LTG: Increase coping skills to manage depression and improve ability to perform daily activities (OP Depression) LTG: I just want to be happy (OP Depression) LTG: Work on communicating my needs better (Anxiety)  Initial (1) STG: Chenise will identify cognitive patterns and beliefs that support depression and anxiety (OP Depression)  ProgressTowards Goals: Progressing  Interventions: CBT, Motivational Interviewing, and Supportive  Summary: Jill Glenn is a 58 y.o. female with past psych history of MDD, GAD, and PTSD, presenting for follow-up therapy session in efforts to improve management of depressive and anxious symptoms.   Patient actively engaged in session, presenting in depressed moods, and congruent affect, with periods of tearfulness throughout duration of visit.  Patient openly engaged in introductory check-in, sharing of Doing good, further detailing of events of the past month, having been busy with caring for father as well as having moved rehab appointments to mornings. Pt engaged in review of recent med man visit last month, having increased Celexa , and having resumed medication regimen as prescribed by taking anxiety medications in the morning. Pt additionally shared of individual efforts at improving management of stress and improvement in moods, detailing of having resumed diamond art and writing in journal to aid in managing stressors. Pt shared of having recently been caring for father and presenting medical needs, finding satisfaction and motivation in caring for father proving to give pt something to do.  Pt also noted of spouse proving to be in improved moods, finding to be getting along better at home. Engaged in reassessing presenting depressive and anxious sxs via PHQ-9 and GAD-7, noting of significant reduction in both sxs. Engaged in review of individualized tx goals outlined in tx plan and areas of progress and continued areas for work.   Patient responded well to interventions. Patient continues to meet criteria for MDD, GAD, and PTSD. Patient will continue to benefit from engagement in outpatient therapy due to being the least restrictive service to meet presenting needs.      08/24/2023    3:44 PM 07/25/2023    4:33 PM 06/22/2023    2:23 PM 06/07/2023    1:50 PM 03/29/2023   11:14 AM  Depression screen PHQ 2/9  Decreased Interest 1 0 1 1 0  Down, Depressed, Hopeless 0 0 1 1 1   PHQ - 2 Score 1 0 2 2 1   Altered sleeping 3 1 3 1  0  Tired, decreased energy 0 1 3 1  0  Change in appetite 0 0 0 0 0  Feeling bad or failure about yourself  0 1 2 1 1   Trouble concentrating 1 0 1 1 1   Moving slowly or fidgety/restless 0 0 0 0 0  Suicidal thoughts 0 0 0 0 0  PHQ-9 Score 5 3 11 6 3   Difficult doing work/chores Not difficult at all Not difficult at all Somewhat difficult Somewhat difficult Not difficult at all      08/24/2023    3:41 PM 06/22/2023    2:15 PM 06/07/2023    1:46 PM 03/29/2023   11:11 AM  GAD 7 : Generalized Anxiety Score  Nervous, Anxious, on Edge 0 1 2 0  Control/stop worrying 3 3 3  0  Worry too much - different things 3 3 3  0  Trouble relaxing 0 2 2 0  Restless 0 3 3 1   Easily annoyed or irritable 0 2 1 0  Afraid - awful might happen 0 0 0 0  Total GAD 7 Score 6 14 14 1   Anxiety Difficulty Not difficult at all Somewhat difficult Extremely difficult Not difficult at all     Suicidal/Homicidal: Nowithout intent/plan  Therapist Response: Clinician utilized CBT, MI, and solution focused interventions to address pt's identified presenting problems and depressive and anxious  sxs.   Clinician actively greeted patient upon presenting for today's visit, assessing presenting moods. Actively engaged patient in introductory check-in, utilizing open ended questions to elicit recounts of events of the past month since prior visit, actively listening to recounts of progressions, management of stressors, and factors contributing to improved moods. Provided support and validation for pt's expressed feelings.  Clinician reassessed severity of depressive and anxious sxs, and presence of any safety concerns. Therapist provided support and empathy to patient during session.  Plan: Return again in 2 week.  Diagnosis:  Encounter Diagnoses  Name Primary?   Generalized anxiety disorder Yes   MDD (major depressive disorder), recurrent episode, moderate (HCC)        Collaboration of Care: Psychiatrist AEB provider notes available in EHR.  Patient/Guardian was advised Release of Information must be obtained prior to any record release in order to collaborate their care with an outside provider. Patient/Guardian was advised if they have not already done so to contact the registration department to sign all necessary forms in order for us  to release information regarding their care.   Consent: Patient/Guardian gives verbal consent for treatment and assignment of benefits for services provided during this visit. Patient/Guardian expressed understanding and agreed to proceed.   Virtual Visit via Video Note  I connected with Jill Glenn on 08/24/23 at  3:00 PM EDT by a video enabled telemedicine application and verified that I am speaking with the correct person using two identifiers.  Location: Patient: Home Provider: Home Office   I discussed the limitations of evaluation and management by telemedicine and the availability of in person appointments. The patient expressed understanding and agreed to proceed.  I discussed the assessment and treatment plan with the  patient. The patient was provided an opportunity to ask questions and all were answered. The patient agreed with the plan and demonstrated an understanding of the instructions.   The patient was advised to call back or seek an in-person evaluation if the symptoms worsen or if the condition fails to improve as anticipated.  I provided 59 minutes of non-face-to-face time during this encounter.   [name], MSW, LCSW 08/24/2023,  3:46 PM

## 2023-08-24 NOTE — Progress Notes (Signed)
 Daily Session Note  Patient Details  Name: Jill Glenn MRN: [id number] Date of Birth: 1965/11/30 Referring Provider:   Conrad Ports Pulmonary Rehab Walk Test from 10/26/2022 in [facility]  Referring Provider Me[place]    Encounter Date: 08/24/2023  Check In:  Session Check In - 08/24/23 0825       Check-In   Supervising physician immediately available to respond to emergencies [facility] MD immediately available    Physician(s) Rosaline Skains, NP    Location [facility]    Staff Present Johnnie Moats, MS, ACSM-CEP, Exercise Physiologist;Kayton Ripp Claudene Candia Levin, RN, BSN    Virtual Visit No    Medication changes reported     No    Fall or balance concerns reported    No    Tobacco Cessation No Change    Warm-up and Cool-down Performed as group-led instruction    Resistance Training Performed Yes    VAD Patient? No    PAD/SET Patient? No      Pain Assessment   Currently in Pain? No/denies    Pain Score 0-No pain    Multiple Pain Sites No          Capillary Blood Glucose: No results found for this or any previous visit (from the past 24 hours).    Social History   Tobacco Use  Smoking Status Former   Current packs/day: 0.00   Average packs/day: 1 pack/day for 40.6 years (40.6 ttl pk-yrs)   Types: Cigarettes   Start date: 03/05/1981   Quit date: 10/14/2021   Years since quitting: 1.8  Smokeless Tobacco Never    Goals Met:  Proper associated with RPD/PD & O2 Sat Independence with exercise equipment Exercise tolerated well No report of concerns or symptoms today Strength training completed today  Goals Unmet:  Not Applicable  Comments: Service time is from 0809 to 0931.    Dr. Slater Staff is Medical Director for Pulmonary Rehab at [facility]

## 2023-08-29 ENCOUNTER — Encounter (HOSPITAL_COMMUNITY)
Admission: RE | Admit: 2023-08-29 | Discharge: 2023-08-29 | Disposition: A | Discharge status: Home / Self Care | Source: Ambulatory Visit | Attending: Internal Medicine

## 2023-08-29 VITALS — Wt 166.9 lb

## 2023-08-29 DIAGNOSIS — J449 Chronic obstructive pulmonary disease, unspecified: Secondary | ICD-10-CM

## 2023-08-29 NOTE — Progress Notes (Signed)
 Daily Session Note  Patient Details  Name: Jill Glenn MRN: [id number] Date of Birth: 06/10/1965 Referring Provider:   Conrad Ports Pulmonary Rehab Walk Test from 10/26/2022 in [facility]  Referring Provider Meade    Encounter Date: 08/29/2023  Check In:  Session Check In - 08/29/23 9188       Check-In   Supervising physician immediately available to respond to emergencies [facility] MD immediately available    Physician(s) Rosaline Skains, NP    [facility]    Staff Present Johnnie Moats, MS, ACSM-CEP, Exercise Physiologist;Casey Claudene Candia Levin, RN, BSN;Randi Reeve BS, ACSM-CEP, Exercise Physiologist    Virtual Visit No    Medication changes reported     No    Fall or balance concerns reported    No    Tobacco Cessation No Change    Warm-up and Cool-down Performed as group-led instruction    Resistance Training Performed Yes    VAD Patient? No    PAD/SET Patient? No      Pain Assessment   Currently in Pain? No/denies    Pain Score 0-No pain    Multiple Pain Sites No          Capillary Blood Glucose: No results found for this or any previous visit (from the past 24 hours).    Social History   Tobacco Use  Smoking Status Former   Current packs/day: 0.00   Average packs/day: 1 pack/day for 40.6 years (40.6 ttl pk-yrs)   Types: Cigarettes   Start date: 03/05/1981   Quit date: 10/14/2021   Years since quitting: 1.8  Smokeless Tobacco Never    Goals Met:  Proper associated with RPD/PD & O2 Sat Independence with exercise equipment Exercise tolerated well No report of concerns or symptoms today Strength training completed today  Goals Unmet:  Not Applicable  Comments: Service time is from 0803 to 0910.    Dr. Slater Staff is Medical Director for Pulmonary Rehab at [facility].

## 2023-08-31 ENCOUNTER — Ambulatory Visit (HOSPITAL_BASED_OUTPATIENT_CLINIC_OR_DEPARTMENT_OTHER): Attending: Internal Medicine | Admitting: Internal Medicine

## 2023-08-31 ENCOUNTER — Telehealth (HOSPITAL_COMMUNITY): Payer: Self-pay

## 2023-08-31 ENCOUNTER — Encounter (HOSPITAL_COMMUNITY): Admission: RE | Admit: 2023-08-31 | Source: Ambulatory Visit

## 2023-08-31 DIAGNOSIS — G4719 Other hypersomnia: Secondary | ICD-10-CM | POA: Diagnosis not present

## 2023-08-31 NOTE — Telephone Encounter (Signed)
 Patient left message calling out sick for 8:15 PR class.

## 2023-09-01 ENCOUNTER — Telehealth (HOSPITAL_COMMUNITY): Admitting: Student

## 2023-09-01 DIAGNOSIS — F331 Major depressive disorder, recurrent, moderate: Secondary | ICD-10-CM

## 2023-09-01 DIAGNOSIS — F411 Generalized anxiety disorder: Secondary | ICD-10-CM

## 2023-09-01 DIAGNOSIS — G4700 Insomnia, unspecified: Secondary | ICD-10-CM | POA: Diagnosis not present

## 2023-09-01 DIAGNOSIS — F4310 Post-traumatic stress disorder, unspecified: Secondary | ICD-10-CM | POA: Diagnosis not present

## 2023-09-01 MED ORDER — TRAZODONE HCL 50 MG PO TABS: 25.0000 mg | ORAL_TABLET | Freq: Every evening | ORAL | 0 refills | Status: DC | PRN
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
## 2023-09-02 DIAGNOSIS — J432 Centrilobular emphysema: Secondary | ICD-10-CM | POA: Diagnosis not present

## 2023-09-02 DIAGNOSIS — J449 Chronic obstructive pulmonary disease, unspecified: Secondary | ICD-10-CM | POA: Diagnosis not present

## 2023-09-02 DIAGNOSIS — R0602 Shortness of breath: Secondary | ICD-10-CM | POA: Diagnosis not present

## 2023-09-05 ENCOUNTER — Telehealth (HOSPITAL_COMMUNITY): Payer: Self-pay

## 2023-09-05 ENCOUNTER — Encounter (HOSPITAL_COMMUNITY)

## 2023-09-05 NOTE — Telephone Encounter (Signed)
 Received VM that pt is going out of town. Pt will be absent from [place] today.

## 2023-09-06 ENCOUNTER — Encounter: Payer: Self-pay | Admitting: Internal Medicine

## 2023-09-06 NOTE — Progress Notes (Signed)
 Pulmonary Individual Treatment Plan  Patient Details  Name: Jill Glenn MRN: [id number] Date of Birth: 12-11-1965 Referring Provider:   Conrad Ports Pulmonary Rehab Walk Test from 10/26/2022 in [facility]  Referring Provider [place]    Initial Encounter Date:  Flowsheet Row Pulmonary Rehab Walk Test from 10/26/2022 in [facility]  Date 10/26/22    Visit Diagnosis: Stage 3 severe COPD by GOLD classification (HCC)  Patient's Home Medications on Admission:   Current Outpatient Medications:    acetaminophen  (TYLENOL ) 325 MG tablet, Take 2 tablets (650 mg total) by mouth every 6 (six) hours as needed for moderate pain, mild pain or headache., Disp: , Rfl:    albuterol  (PROVENTIL ) (2.5 MG/3ML) 0.083% nebulizer solution, Take 3 mLs (2.5 mg total) by nebulization every 6 (six) hours as needed for wheezing or shortness of breath., Disp: 75 mL, Rfl: 12   albuterol  (VENTOLIN  HFA) 108 (90 Base) MCG/ACT inhaler, Inhale 2 puffs into the lungs every 6 (six) hours as needed for wheezing or shortness of breath., Disp: 18 g, Rfl: 3   busPIRone  (BUSPAR ) 15 MG tablet, Take 1 tablet (15 mg total) by mouth 2 (two) times daily., Disp: 180 tablet, Rfl: 0   citalopram  (CELEXA ) 20 MG tablet, Take 1 tablet (20 mg total) by mouth daily., Disp: 90 tablet, Rfl: 0   hydrOXYzine  (ATARAX ) 25 MG tablet, Take 1 tablet (25 mg total) by mouth at bedtime as needed (sleep)., Disp: 90 tablet, Rfl: 0   levocetirizine (XYZAL ) 5 MG tablet, TAKE 1 TABLET BY MOUTH ONCE DAILY IN THE EVENING, Disp: 30 tablet, Rfl: 10   montelukast  (SINGULAIR ) 10 MG tablet, TAKE 1 TABLET BY MOUTH AT BEDTIME, Disp: 30 tablet, Rfl: 10   Multiple Vitamin (MULTIVITAMIN ADULT) TABS, Take 1 tablet by mouth daily with breakfast., Disp: , Rfl:    rizatriptan  (MAXALT ) 10 MG tablet, Take 1 tablet (10 mg total) by mouth once as needed for  migraine. May repeat in 2 hours if needed, Disp: 30 tablet, Rfl: 2   rosuvastatin  (CRESTOR ) 10 MG tablet, Take 1 tablet (10 mg total) by mouth daily., Disp: 90 tablet, Rfl: 1   traZODone  (DESYREL ) 50 MG tablet, Take 0.5-1 tablets (25-50 mg total) by mouth at bedtime as needed for sleep., Disp: 60 tablet, Rfl: 0   TRELEGY ELLIPTA  200-62.5-25 MCG/ACT AEPB, INHALE 1 PUFF ONCE DAILY, Disp: 60 each, Rfl: 10   Ubrogepant  (UBRELVY ) 100 MG TABS, Take one tablet onset of Migraine May take another tablet 2 hours later, Don't exceed two tablets daily, Disp: 16 tablet, Rfl: 11   varenicline  (CHANTIX ) 1 MG tablet, Take 1 mg by mouth daily., Disp: , Rfl:   Past Medical History: Past Medical History:  Diagnosis Date   Allergy     seasonal   Anxiety    Asthma    COPD (chronic obstructive pulmonary disease) (HCC)    Depression    Emphysema of lung (HCC)    PTSD (post-traumatic stress disorder)     Tobacco Use: Social History   Tobacco Use  Smoking Status Former   Current packs/day: 0.00   Average packs/day: 1 pack/day for 40.6 years (40.6 ttl pk-yrs)   Types: Cigarettes   Start date: 03/05/1981   Quit date: 10/14/2021   Years since quitting: 1.8  Smokeless Tobacco Never    Labs: Review Flowsheet  More data exists      Latest Ref Rng &  Units 03/24/2022 04/12/2022 11/16/2022 05/16/2023 05/23/2023  Labs for ITP Cardiac and Pulmonary Rehab  Cholestrol 0 - 200 mg/dL - 740  - 777  -  LDL (calc) 0 - 99 mg/dL - 839  - 860  -  HDL-C >39.00 mg/dL - 28.89  - 42.39  -  Trlycerides 0.0 - 149.0 mg/dL - 860.9  - 869.9  -  Hemoglobin A1c 4.6 - 6.5 % 6.0  - 5.9  - -  PH, Arterial 7.35 - 7.45 - - - - 7.49   PCO2 arterial 32 - 48 mmHg - - - - 32   Bicarbonate 20.0 - 28.0 mmol/L - - - - 24.4   O2 Saturation % - - - - 98.3     Capillary Blood Glucose: Lab Results  Component Value Date   GLUCAP 73 03/26/2022   GLUCAP 148 (H) 03/25/2022   GLUCAP 166 (H) 03/25/2022   GLUCAP 88 03/25/2022   GLUCAP 84  03/25/2022     Pulmonary Assessment Scores:  Pulmonary Assessment Scores     Row Name 07/25/23 1634         ADL UCSD   ADL Phase Exit     SOB Score total 66       CAT Score   CAT Score 20       UCSD: Self-administered rating of dyspnea associated with activities of daily living (ADLs) 6-point scale (0 = not at all to 5 = maximal or unable to do because of breathlessness)  Scoring Scores range from 0 to 120.  Minimally important difference is 5 units  CAT: CAT can identify the health impairment of COPD patients and is better correlated with disease progression.  CAT has a scoring range of zero to 40. The CAT score is classified into four groups of low (less than 10), medium (10 - 20), high (21-30) and very high (31-40) based on the impact level of disease on health status. A CAT score over 10 suggests significant symptoms.  A worsening CAT score could be explained by an exacerbation, poor medication adherence, poor inhaler technique, or progression of COPD or comorbid conditions.  CAT MCID is 2 points  mMRC: mMRC (Modified Medical Research Council) Dyspnea Scale is used to assess the degree of baseline functional disability in patients of respiratory disease due to dyspnea. No minimal important difference is established. A decrease in score of 1 point or greater is considered a positive change.   Pulmonary Function Assessment:   Exercise Target Goals: Exercise Program Goal: Individual exercise prescription set using results from initial 6 min walk test and THRR while considering  patient's activity barriers and safety.   Exercise Prescription Goal: Initial exercise prescription builds to 30-45 minutes a day of aerobic activity, 2-3 days per week.  Home exercise guidelines will be given to patient during program as part of exercise prescription that the participant will acknowledge.  Activity Barriers & Risk Stratification:   6 Minute Walk:   Oxygen  Initial  Assessment:  Oxygen  Initial Assessment - 07/06/23 0728       Home Oxygen    Home Oxygen  Device Home Concentrator;Portable Concentrator;E-Tanks    Sleep Oxygen  Prescription Continuous    Liters per minute 2    Home Exercise Oxygen  Prescription Pulsed    Liters per minute 2    Home Resting Oxygen  Prescription None    Compliance with Home Oxygen  Use Yes      Program Oxygen  Prescription   Program Oxygen  Prescription Continuous    Liters per  minute 2      Intervention   Short Term Goals To learn and exhibit compliance with exercise, home and travel O2 prescription;To learn and understand importance of monitoring SPO2 with pulse oximeter and demonstrate accurate use of the pulse oximeter.;To learn and understand importance of maintaining oxygen  saturations>88%;To learn and demonstrate proper pursed lip breathing techniques or other breathing techniques. ;To learn and demonstrate proper use of respiratory medications    Long  Term Goals Exhibits compliance with exercise, home  and travel O2 prescription;Maintenance of O2 saturations>88%;Compliance with respiratory medication;Verbalizes importance of monitoring SPO2 with pulse oximeter and return demonstration;Exhibits proper breathing techniques, such as pursed lip breathing or other method taught during program session;Demonstrates proper use of MDI's          Oxygen  Re-Evaluation:  Oxygen  Re-Evaluation     Row Name 03/20/23 0906 04/12/23 1049 05/12/23 0942 06/09/23 0926 08/02/23 0922     Program Oxygen  Prescription   Program Oxygen  Prescription Continuous Continuous Continuous Continuous Continuous   Liters per minute 2 2 2 2 2      Home Oxygen    Home Oxygen  Device Home Concentrator;Portable Concentrator;E-Tanks Home Concentrator;Portable Concentrator;E-Tanks Home Concentrator;Portable Concentrator;E-Tanks Home Concentrator;Portable Concentrator;E-Tanks Home Concentrator;Portable Concentrator;E-Tanks   Sleep Oxygen  Prescription  Continuous Continuous Continuous Continuous Continuous   Liters per minute 2 2 2 2 2    Home Exercise Oxygen  Prescription Pulsed Pulsed Pulsed Pulsed Pulsed   Liters per minute 2 2 2 2 2    Home Resting Oxygen  Prescription None None None None None   Compliance with Home Oxygen  Use Yes Yes Yes Yes Yes     Goals/Expected Outcomes   Short Term Goals To learn and exhibit compliance with exercise, home and travel O2 prescription;To learn and understand importance of monitoring SPO2 with pulse oximeter and demonstrate accurate use of the pulse oximeter.;To learn and understand importance of maintaining oxygen  saturations>88%;To learn and demonstrate proper pursed lip breathing techniques or other breathing techniques. ;To learn and demonstrate proper use of respiratory medications To learn and exhibit compliance with exercise, home and travel O2 prescription;To learn and understand importance of monitoring SPO2 with pulse oximeter and demonstrate accurate use of the pulse oximeter.;To learn and understand importance of maintaining oxygen  saturations>88%;To learn and demonstrate proper pursed lip breathing techniques or other breathing techniques. ;To learn and demonstrate proper use of respiratory medications To learn and exhibit compliance with exercise, home and travel O2 prescription;To learn and understand importance of monitoring SPO2 with pulse oximeter and demonstrate accurate use of the pulse oximeter.;To learn and understand importance of maintaining oxygen  saturations>88%;To learn and demonstrate proper pursed lip breathing techniques or other breathing techniques. ;To learn and demonstrate proper use of respiratory medications To learn and exhibit compliance with exercise, home and travel O2 prescription;To learn and understand importance of monitoring SPO2 with pulse oximeter and demonstrate accurate use of the pulse oximeter.;To learn and understand importance of maintaining oxygen  saturations>88%;To  learn and demonstrate proper pursed lip breathing techniques or other breathing techniques. ;To learn and demonstrate proper use of respiratory medications To learn and exhibit compliance with exercise, home and travel O2 prescription;To learn and understand importance of monitoring SPO2 with pulse oximeter and demonstrate accurate use of the pulse oximeter.;To learn and understand importance of maintaining oxygen  saturations>88%;To learn and demonstrate proper pursed lip breathing techniques or other breathing techniques. ;To learn and demonstrate proper use of respiratory medications   Long  Term Goals Exhibits compliance with exercise, home  and travel O2 prescription;Maintenance of O2 saturations>88%;Compliance with respiratory  medication;Verbalizes importance of monitoring SPO2 with pulse oximeter and return demonstration;Exhibits proper breathing techniques, such as pursed lip breathing or other method taught during program session;Demonstrates proper use of MDI's Exhibits compliance with exercise, home  and travel O2 prescription;Maintenance of O2 saturations>88%;Compliance with respiratory medication;Verbalizes importance of monitoring SPO2 with pulse oximeter and return demonstration;Exhibits proper breathing techniques, such as pursed lip breathing or other method taught during program session;Demonstrates proper use of MDI's Exhibits compliance with exercise, home  and travel O2 prescription;Maintenance of O2 saturations>88%;Compliance with respiratory medication;Verbalizes importance of monitoring SPO2 with pulse oximeter and return demonstration;Exhibits proper breathing techniques, such as pursed lip breathing or other method taught during program session;Demonstrates proper use of MDI's Exhibits compliance with exercise, home  and travel O2 prescription;Maintenance of O2 saturations>88%;Compliance with respiratory medication;Verbalizes importance of monitoring SPO2 with pulse oximeter and return  demonstration;Exhibits proper breathing techniques, such as pursed lip breathing or other method taught during program session;Demonstrates proper use of MDI's Exhibits compliance with exercise, home  and travel O2 prescription;Maintenance of O2 saturations>88%;Compliance with respiratory medication;Verbalizes importance of monitoring SPO2 with pulse oximeter and return demonstration;Exhibits proper breathing techniques, such as pursed lip breathing or other method taught during program session;Demonstrates proper use of MDI's   Goals/Expected Outcomes Compliance and understanding of oxygen  saturations monitoring and breathing techniques to decrease shortness of breath. Compliance and understanding of oxygen  saturations monitoring and breathing techniques to decrease shortness of breath. Compliance and understanding of oxygen  saturations monitoring and breathing techniques to decrease shortness of breath. Compliance and understanding of oxygen  saturations monitoring and breathing techniques to decrease shortness of breath. Compliance and understanding of oxygen  saturations monitoring and breathing techniques to decrease shortness of breath.    Row Name 08/29/23 1625             Program Oxygen  Prescription   Program Oxygen  Prescription Continuous       Liters per minute 2         Home Oxygen    Home Oxygen  Device Home Concentrator;Portable Concentrator;E-Tanks       Sleep Oxygen  Prescription Continuous       Liters per minute 2       Home Exercise Oxygen  Prescription Pulsed       Liters per minute 2       Home Resting Oxygen  Prescription None       Compliance with Home Oxygen  Use Yes         Goals/Expected Outcomes   Short Term Goals To learn and exhibit compliance with exercise, home and travel O2 prescription;To learn and understand importance of monitoring SPO2 with pulse oximeter and demonstrate accurate use of the pulse oximeter.;To learn and understand importance of maintaining oxygen   saturations>88%;To learn and demonstrate proper pursed lip breathing techniques or other breathing techniques. ;To learn and demonstrate proper use of respiratory medications       Long  Term Goals Exhibits compliance with exercise, home  and travel O2 prescription;Maintenance of O2 saturations>88%;Compliance with respiratory medication;Verbalizes importance of monitoring SPO2 with pulse oximeter and return demonstration;Exhibits proper breathing techniques, such as pursed lip breathing or other method taught during program session;Demonstrates proper use of MDI's       Goals/Expected Outcomes Compliance and understanding of oxygen  saturations monitoring and breathing techniques to decrease shortness of breath.          Oxygen  Discharge (Final Oxygen  Re-Evaluation):  Oxygen  Re-Evaluation - 08/29/23 1625       Program Oxygen  Prescription   Program Oxygen  Prescription Continuous  Liters per minute 2      Home Oxygen    Home Oxygen  Device Home Concentrator;Portable Concentrator;E-Tanks    Sleep Oxygen  Prescription Continuous    Liters per minute 2    Home Exercise Oxygen  Prescription Pulsed    Liters per minute 2    Home Resting Oxygen  Prescription None    Compliance with Home Oxygen  Use Yes      Goals/Expected Outcomes   Short Term Goals To learn and exhibit compliance with exercise, home and travel O2 prescription;To learn and understand importance of monitoring SPO2 with pulse oximeter and demonstrate accurate use of the pulse oximeter.;To learn and understand importance of maintaining oxygen  saturations>88%;To learn and demonstrate proper pursed lip breathing techniques or other breathing techniques. ;To learn and demonstrate proper use of respiratory medications    Long  Term Goals Exhibits compliance with exercise, home  and travel O2 prescription;Maintenance of O2 saturations>88%;Compliance with respiratory medication;Verbalizes importance of monitoring SPO2 with pulse oximeter and  return demonstration;Exhibits proper breathing techniques, such as pursed lip breathing or other method taught during program session;Demonstrates proper use of MDI's    Goals/Expected Outcomes Compliance and understanding of oxygen  saturations monitoring and breathing techniques to decrease shortness of breath.          Initial Exercise Prescription:   Perform Capillary Blood Glucose checks as needed.  Exercise Prescription Changes:   Exercise Prescription Changes     Row Name 03/21/23 1100 04/04/23 1100 04/11/23 1451 05/02/23 1200 05/16/23 1100     Response to Exercise   Blood Pressure (Admit) 96/56 100/62 96/58 102/58 94/64   Blood Pressure (Exercise) 118/54 110/64 -- 110/64 106/72   Blood Pressure (Exit) 90/56 94/58 92/60  102/68 104/60   Heart Rate (Admit) 67 bpm 72 bpm 69 bpm 69 bpm 68 bpm   Heart Rate (Exercise) 99 bpm 94 bpm 126 bpm 121 bpm 108 bpm   Heart Rate (Exit) 82 bpm 78 bpm 96 bpm 91 bpm 78 bpm   Oxygen  Saturation (Admit) 96 % 97 % 99 % 97 % 99 %  2L   Oxygen  Saturation (Exercise) 93 % 92 % 90 % 90 % 93 %  2L   Oxygen  Saturation (Exit) 96 % 95 % 97 % 96 % 96 %  2L   Rating of Perceived Exertion (Exercise) 13 13 13 12 13    Perceived Dyspnea (Exercise) 2 3 3 2 2    Duration Continue with 30 min of aerobic exercise without signs/symptoms of physical distress. Continue with 30 min of aerobic exercise without signs/symptoms of physical distress. Continue with 30 min of aerobic exercise without signs/symptoms of physical distress. Continue with 30 min of aerobic exercise without signs/symptoms of physical distress. Continue with 30 min of aerobic exercise without signs/symptoms of physical distress.   Intensity THRR unchanged THRR unchanged THRR unchanged THRR unchanged THRR unchanged     Progression   Progression Continue to progress workloads to maintain intensity without signs/symptoms of physical distress. Continue to progress workloads to maintain intensity without  signs/symptoms of physical distress. Continue to progress workloads to maintain intensity without signs/symptoms of physical distress. Continue to progress workloads to maintain intensity without signs/symptoms of physical distress. Continue to progress workloads to maintain intensity without signs/symptoms of physical distress.     Resistance Training   Training Prescription Yes Yes Yes Yes Yes   Weight blue bands blue bands blue bands blue bands blue bands   Reps 10-15 10-15 10-15 10-15 10-15   Time 10 Minutes 10 Minutes 10 Minutes  10 Minutes 10 Minutes     Interval Training   Interval Training Yes Yes Yes Yes Yes   Equipment Treadmill Treadmill Treadmill Treadmill Treadmill   Comments 68min@2 .51mph&3%incline METS 3.6, 53min@1 .67mph&0%incline METS 2.2 36min@2 .49mph&3%incline METS 3.6, 67min@1 .69mph&0%incline METS 2.2 67min@2 .30mph&3%incline METS 3.6, 40min@1 .14mph&0%incline METS 2.2 67min@2 .67mph&3%incline METS 3.6, 63min@1 .40mph&0%incline METS 2.2 65min@2 .62mph&3%incline METS 3.6, 49min@1 .35mph&0%incline METS 2.2     Oxygen    Oxygen  Continuous Continuous Continuous Continuous Continuous   Liters 2 2 2 2 2      Recumbant Elliptical   Level 4 4 5 5 5    RPM -- 56 56 58 --   Watts -- 75 75 84 --   Minutes 15 15 15 15 15    METs 4 3.5 3.3 4 4.2     Oxygen    Maintain Oxygen  Saturation 88% or higher 88% or higher 88% or higher 88% or higher 88% or higher    Row Name 05/25/23 1140 06/13/23 1100 06/27/23 1200 07/11/23 1100 07/25/23 1200     Response to Exercise   Blood Pressure (Admit) 92/68 90/68 98/60  96/56 92/60   Blood Pressure (Exercise) -- 120/70 118/62 102/60 110/62   Blood Pressure (Exit) 126/73 94/94 96/58  80/60 96/60   Heart Rate (Admit) 66 bpm 68 bpm 63 bpm 67 bpm 66 bpm   Heart Rate (Exercise) 106 bpm 90 bpm 103 bpm 103 bpm 96 bpm   Heart Rate (Exit) 79 bpm 70 bpm 83 bpm 80 bpm 76 bpm   Oxygen  Saturation (Admit) 95 % 98 % 98 % 97 % 99 %   Oxygen  Saturation (Exercise) 92 % 92 % 94 % 91 % 95 %    Oxygen  Saturation (Exit) 97 % 96 % 95 % 97 % 95 %   Rating of Perceived Exertion (Exercise) 12.5 13 13 13 13    Perceived Dyspnea (Exercise) 2 2 2 3 2    Duration Continue with 30 min of aerobic exercise without signs/symptoms of physical distress. Continue with 30 min of aerobic exercise without signs/symptoms of physical distress. Continue with 30 min of aerobic exercise without signs/symptoms of physical distress. Continue with 30 min of aerobic exercise without signs/symptoms of physical distress. Continue with 30 min of aerobic exercise without signs/symptoms of physical distress.   Intensity THRR unchanged THRR unchanged THRR unchanged THRR unchanged THRR unchanged     Progression   Progression Continue to progress workloads to maintain intensity without signs/symptoms of physical distress. Continue to progress workloads to maintain intensity without signs/symptoms of physical distress. Continue to progress workloads to maintain intensity without signs/symptoms of physical distress. Continue to progress workloads to maintain intensity without signs/symptoms of physical distress. Continue to progress workloads to maintain intensity without signs/symptoms of physical distress.   Average METs 4.1 -- -- -- --     Resistance Training   Training Prescription Yes Yes Yes Yes Yes   Weight blue bands black bands black bands black bands black bands   Reps 10-15 10-15 10-15 10-15 10-15   Time 10 Minutes 10 Minutes 10 Minutes 10 Minutes 10 Minutes     Interval Training   Interval Training Yes Yes Yes Yes Yes   Equipment Treadmill Treadmill Treadmill Treadmill Treadmill   Comments 53min@2 .64mph&3%incline METS 3.6, 66min@1 .12mph&0%incline METS 2.2 69min@2 .29mph&3%incline METS 3.6, 42min@1 .12mph&0%incline METS 2.2 7min@2 .21mph&3.5%incline METS 4.1, 52min@2 .44mph&0%incline METS 2.6 71min@2 .36mph&3.5%incline METS 4.1, 23min@2 .50mph&0%incline METS 2.6 84min@2 .35mph&3.5%incline METS 4.1, 28min@2 .65mph&0%incline METS 2.6      Oxygen    Oxygen  Continuous Continuous Continuous Continuous Continuous   Liters 2 2 2 2  2-3  Treadmill   MPH -- 2.3 -- 2.8 --   Grade -- 3 -- 3.5 --   Minutes -- 15 -- 15 --   METs -- 3.71 -- 4.1 --     Recumbant Elliptical   Level 5 -- 5 5 5    Minutes 15 -- 15 15 15    METs 4.1 -- 4.1 3.3 3.6     Oxygen    Maintain Oxygen  Saturation 88% or higher 88% or higher 88% or higher 88% or higher 88% or higher    Row Name 08/01/23 1204 08/22/23 0800 08/29/23 0905         Response to Exercise   Blood Pressure (Admit) 98/70 106/58 94/60     Blood Pressure (Exercise) -- 126/60 --     Blood Pressure (Exit) 94/60 100/60 100/58     Heart Rate (Admit) 67 bpm 63 bpm 67 bpm     Heart Rate (Exercise) 109 bpm 93 bpm 95 bpm     Heart Rate (Exit) 79 bpm 88 bpm 86 bpm     Oxygen  Saturation (Admit) 97 % 96 %  2L 95 %     Oxygen  Saturation (Exercise) 93 % 94 %  3L 94 %     Oxygen  Saturation (Exit) 97 % 98 %  3L 97 %     Rating of Perceived Exertion (Exercise) 13 13 13      Perceived Dyspnea (Exercise) 2 2 2      Duration Continue with 30 min of aerobic exercise without signs/symptoms of physical distress. Continue with 30 min of aerobic exercise without signs/symptoms of physical distress. Continue with 30 min of aerobic exercise without signs/symptoms of physical distress.     Intensity THRR unchanged THRR unchanged THRR unchanged       Progression   Progression Continue to progress workloads to maintain intensity without signs/symptoms of physical distress. Continue to progress workloads to maintain intensity without signs/symptoms of physical distress. Continue to progress workloads to maintain intensity without signs/symptoms of physical distress.       Resistance Training   Training Prescription Yes Yes Yes     Weight black bands black bands black bands     Reps 10-15 10-15 10-15     Time 10 Minutes 10 Minutes 10 Minutes       Interval Training   Interval Training Yes Yes Yes      Equipment Treadmill Treadmill Treadmill     Comments 29min@2 .62mph&3.5%incline METS 4.1, 50min@2 .36mph&0%incline METS 2.6 70min@2 .90mph&3.5%incline METS 4.1, 68min@2 .15mph&0%incline METS 2.6 98min@2 .77mph&3.5%incline METS 4.1, 80min@2 .31mph&0%incline METS 2.6       Oxygen    Oxygen  Continuous Continuous Continuous     Liters 2-3 2-3 2-3       Treadmill   MPH 2.8 2.8 --     Grade 3.5 3.5 --     Minutes 15 15 --     METs 4.1 4.1 --       Recumbant Elliptical   Level 5 5 5      RPM -- 55 --     Watts -- 77 --     Minutes 15 15 15      METs 4.6 3.8 4       Oxygen    Maintain Oxygen  Saturation 88% or higher 88% or higher 88% or higher        Exercise Comments:   Exercise Goals and Review:   Exercise Goals Re-Evaluation :  Exercise Goals Re-Evaluation     Row Name 03/20/23 [phone] 04/12/23 1047 05/12/23 9060 06/09/23 9075 07/06/23 9271  Exercise Goal Re-Evaluation   Exercise Goals Review Increase Physical Activity;Able to understand and use Dyspnea scale;Understanding of Exercise Prescription;Increase Strength and Stamina;Knowledge and understanding of Target Heart Rate Range (THRR);Able to understand and use rate of perceived exertion (RPE) scale Increase Physical Activity;Able to understand and use Dyspnea scale;Understanding of Exercise Prescription;Increase Strength and Stamina;Knowledge and understanding of Target Heart Rate Range (THRR);Able to understand and use rate of perceived exertion (RPE) scale Increase Physical Activity;Able to understand and use Dyspnea scale;Understanding of Exercise Prescription;Increase Strength and Stamina;Knowledge and understanding of Target Heart Rate Range (THRR);Able to understand and use rate of perceived exertion (RPE) scale Increase Physical Activity;Able to understand and use Dyspnea scale;Understanding of Exercise Prescription;Increase Strength and Stamina;Knowledge and understanding of Target Heart Rate Range (THRR);Able to understand and use rate of  perceived exertion (RPE) scale Increase Physical Activity;Able to understand and use Dyspnea scale;Understanding of Exercise Prescription;Increase Strength and Stamina;Knowledge and understanding of Target Heart Rate Range (THRR);Able to understand and use rate of perceived exertion (RPE) scale   Comments Jill Glenn has completed 27 exercise sessions. She exercises for 15 min on the recumbent elliptical and treadmill. She averages 3.8 METs at level 4 on the recumbent elliptical and 3.6 METs on the treadmill. She performs the warmup and cooldown standing without limitations. Jill Glenn has increased her speed and incline on the treadmill as her intervals have changed. Jill Glenn does intervals on certain days. Will continue to monitor and progress as able. Jill Glenn has completed 33 exercise sessions. She exercises for 15 min on the recumbent elliptical and treadmill. She averages 3.3 METs at level 5 on the recumbent elliptical and 2.2-3.6 METs on the treadmill. She performs the warmup and cooldown standing without limitations. Jill Glenn has increased her level on the recumbent elliptical as METs remain relatively the same. Her speed has increased in her high interval. She continue to tolerate progressions well. Will continue to monitor and progress as able. Jill Glenn has completed 39 exercise sessions. She exercises for 15 min on the recumbent elliptical and treadmill. She averages 3.9 METs at level 5 on the recumbent elliptical and 1.8-2.3 METs on the treadmill. She performs the warmup and cooldown standing without limitations. Jill Glenn has increased her level on the recumbent elliptical as METs have increased. Her intervals on the treadmill have remained the relatively the same. Will continue to monitor and progress as able. Jill Glenn has completed 43 exercise sessions. She exercises for 15 min on the recumbent elliptical and treadmill. She averages 4.1 METs at level 5 on the recumbent elliptical and 1.8-2.3 METs on the treadmill. She  performs the warmup and cooldown standing without limitations. Jill Glenn has increased her level on the recumbent elliptical as METs have increased. She still tolerates progressions well. Jill Glenn has missed several sessions due to other appointments. Will continue to monitor and progress as able. Jill Glenn has completed 49 exercise sessions. She exercises for 15 min on the recumbent elliptical and treadmill. She averages 4.1 METs at level 5 on the recumbent elliptical and 2.6-4.1 METs on the treadmill. She performs the warmup and cooldown standing without limitations. Jill Glenn has increased her speed  and incline intervals on the treadmill. She tolerates this well thus far. Will continue to monitor and progress as able.   Expected Outcomes Through exercise at rehab and home, the patient will decrease shortness of breath with daily activities and feel confident in carrying out an exercise regimen at home. Through exercise at rehab and home, the patient will decrease shortness of breath with daily activities and  feel confident in carrying out an exercise regimen at home. Through exercise at rehab and home, the patient will decrease shortness of breath with daily activities and feel confident in carrying out an exercise regimen at home. Through exercise at rehab and home, the patient will decrease shortness of breath with daily activities and feel confident in carrying out an exercise regimen at home. Through exercise at rehab and home, the patient will decrease shortness of breath with daily activities and feel confident in carrying out an exercise regimen at home.    Row Name 08/02/23 9078 08/29/23 1624           Exercise Goal Re-Evaluation   Exercise Goals Review Increase Physical Activity;Able to understand and use Dyspnea scale;Understanding of Exercise Prescription;Increase Strength and Stamina;Knowledge and understanding of Target Heart Rate Range (THRR);Able to understand and use rate of perceived exertion  (RPE) scale Increase Physical Activity;Able to understand and use Dyspnea scale;Understanding of Exercise Prescription;Increase Strength and Stamina;Knowledge and understanding of Target Heart Rate Range (THRR);Able to understand and use rate of perceived exertion (RPE) scale      Comments Jill Glenn has completed 55 exercise sessions. She exercises for 15 min on the recumbent elliptical and treadmill. She averages 4.6 METs at level 5 on the recumbent elliptical and 2.6-4.1 METs on the treadmill. She performs the warmup and cooldown standing without limitations. Jill Glenn's METs have slightly increased on the recumbent elliptical. Will continue to monitor and progress as able. Jill Glenn has completed 60 exercise sessions. She exercises for 15 min on the recumbent elliptical and treadmill. She averages 4.0 METs at level 5 on the recumbent elliptical and 2.6-4.1 METs on the treadmill. She performs the warmup and cooldown standing without limitations. Jill Glenn is graduating [employer] soon and plans to exercise at home.      Expected Outcomes Through exercise at rehab and home, the patient will decrease shortness of breath with daily activities and feel confident in carrying out an exercise regimen at home. Through exercise at rehab and home, the patient will decrease shortness of breath with daily activities and feel confident in carrying out an exercise regimen at home.         Discharge Exercise Prescription (Final Exercise Prescription Changes):  Exercise Prescription Changes - 08/29/23 0905       Response to Exercise   Blood Pressure (Admit) 94/60    Blood Pressure (Exit) 100/58    Heart Rate (Admit) 67 bpm    Heart Rate (Exercise) 95 bpm    Heart Rate (Exit) 86 bpm    Oxygen  Saturation (Admit) 95 %    Oxygen  Saturation (Exercise) 94 %    Oxygen  Saturation (Exit) 97 %    Rating of Perceived Exertion (Exercise) 13    Perceived Dyspnea (Exercise) 2    Duration Continue with 30 min of aerobic exercise without  signs/symptoms of physical distress.    Intensity THRR unchanged      Progression   Progression Continue to progress workloads to maintain intensity without signs/symptoms of physical distress.      Resistance Training   Training Prescription Yes    Weight black bands    Reps 10-15    Time 10 Minutes      Interval Training   Interval Training Yes    Equipment Treadmill    Comments 34min@2 .44mph&3.5%incline METS 4.1, 59min@2 .58mph&0%incline METS 2.6      Oxygen    Oxygen  Continuous    Liters 2-3      Recumbant Elliptical   Level 5  Minutes 15    METs 4      Oxygen    Maintain Oxygen  Saturation 88% or higher          Nutrition:  Target Goals: Understanding of nutrition guidelines, daily intake of sodium 1500mg , cholesterol 200mg , calories 30% from fat and 7% or less from saturated fats, daily to have 5 or more servings of fruits and vegetables.  Biometrics:    Nutrition Therapy Plan and Nutrition Goals:  Nutrition Therapy & Goals - 08/15/23 1202       Nutrition Therapy   Diet General Healthy Diet      Personal Nutrition Goals   Nutrition Goal Patient to improve dietary quality by using the plate method as a daily guide for meal planning to include lean protein/plant protein, fruits, vegetables, whole grains, and nonfat/low fat dairy as part of well balanced diet   goal in progress.   Personal Goal #2 Patient to identify strategies for weight loss of 0.5-2.0# per week of weight loss.   goal in progress.   Comments Goals in progress.  Ailee has medical history of pulmonary HTN, COPD3, chronic respiratory failure. Per [facility] transplant RD documentation on 10/05/22, it is recommended that she lose weight to BMI 30/170# and ultimately BMI 27/150#. She reports following a high protein diet, tracking calories ~1400kcals/day.  She continues to work on weight loss by following a high protein/high fiber diet and tracking intake. She does acknowledge some emotional eating/binge type  eating tendencies; she has started seeing behavioral health counselor. Answered patient questions about constipation, fiber ,etc. She is down 10# since highest weight starting pulmonary rehab at 80.7kg. LDL is elevated and A1c is in a prediabetic range. Javen would continue to benefit from weight loss and decrease intake of refined carbohydrates to support pulmonary disease.      Intervention Plan   Intervention Prescribe, educate and counsel regarding individualized specific dietary modifications aiming towards targeted core components such as weight, hypertension, lipid management, diabetes, heart failure and other comorbidities.;Nutrition handout(s) given to patient.    Expected Outcomes Short Term Goal: Understand basic principles of dietary content, such as calories, fat, sodium, cholesterol and nutrients.;Long Term Goal: Adherence to prescribed nutrition plan.          Nutrition Assessments:  MEDIFICTS Score Key: >=70 Need to make dietary changes  40-70 Heart Healthy Diet <= 40 Therapeutic Level Cholesterol Diet  Flowsheet Row PULMONARY REHAB CHRONIC OBSTRUCTIVE PULMONARY DISEASE from 02/22/2022 in [facility]  Picture Your Plate Total Score on Discharge 50   Picture Your Plate Scores: <59 Unhealthy dietary pattern with much room for improvement. 41-50 Dietary pattern unlikely to meet recommendations for good health and room for improvement. 51-60 More healthful dietary pattern, with some room for improvement.  >60 Healthy dietary pattern, although there may be some specific behaviors that could be improved.    Nutrition Goals Re-Evaluation:  Nutrition Goals Re-Evaluation     Row Name 03/13/23 1439 04/11/23 1129 05/09/23 1109 06/08/23 1006 07/11/23 1033     Goals   Current Weight 178 lb 5.6 oz (80.9 kg) 176 lb 12.9 oz (80.2 kg) 174 lb 2.6 oz (79 kg) 173 lb 1 oz (78.5 kg) 169 lb 5 oz (76.8 kg)   Comment A1c 5.9; other  most recent labs LDL 134, cholesterol 208 A1c 5.9; other most recent labs LDL 134, cholesterol 208 no new labs; most recent labs A1c 5.9,LDL 134, cholesterol 208 total cholesterol 222, LDL  139, A1c 5.9 total cholesterol 222, LDL 139, A1c 5.9   Expected Outcome Goals in progress. Weight loss goal not met. Jill Glenn has medical history of pulmonary HTN, COPD3, chronic respiratory failure. Per [facility] transplant RD documentation on 10/05/22, it is recommended that she lose weight to BMI 30/170# and ultimately BMI 27/150#. She does report history of over snacking on refined carbohydates, snacking in the middle of the night, etc. She has previously completed pulmonary rehab in February 2024; her starting weight at that time was 69.8kg/153.6#. She has maintained weight over the last ~5 months. She has previously used [employer] app to aid with tracking calories. She has maintained her weight since starting with our program. She does acknowledge some emotional eating/binge type eating tendencies; she has started seeing behavioral health counselor. Anntionette would continue to benefit from weight loss and decrease intake of refined carbohydrates to support pulmonary disease. Goals in progress. Weight loss goal not met; she has maintained her weight. Estie has medical history of pulmonary HTN, COPD3, chronic respiratory failure. Per [facility] transplant RD documentation on 10/05/22, it is recommended that she lose weight to BMI 30/170# and ultimately BMI 27/150#. She does report history of over snacking on refined carbohydates, snacking in the middle of the night, etc. She has previously completed pulmonary rehab in February 2024; her starting weight at that time was 69.8kg/153.6#. She has maintained weight over the last ~6 months. She has previously used [employer] app to aid with tracking calories. She has maintained her weight since starting with our program. She does acknowledge some emotional eating/binge type eating tendencies;  she has started seeing behavioral health counselor. Tsuruko would continue to benefit from weight loss and decrease intake of refined carbohydrates to support pulmonary disease. Goals in progress. Lesleyann has medical history of pulmonary HTN, COPD3, chronic respiratory failure. Per [facility] transplant RD documentation on 10/05/22, it is recommended that she lose weight to BMI 30/170# and ultimately BMI 27/150#. She reports following a high protein diet, tracking calories ~1400kcals/day. She has previously completed pulmonary rehab in February 2024; her starting weight at that time was 69.8kg/153.6#. She is down 3.7# since starting pulmonary rehab this time. She does acknowledge some emotional eating/binge type eating tendencies; she has started seeing behavioral health counselor. Answered patient questions about constipation, fiber ,etc. Baila would continue to benefit from weight loss and decrease intake of refined carbohydrates to support pulmonary disease. Goals in progress. Azucena has medical history of pulmonary HTN, COPD3, chronic respiratory failure. Per [facility] transplant RD documentation on 10/05/22, it is recommended that she lose weight to BMI 30/170# and ultimately BMI 27/150#. She reports following a high protein diet, tracking calories ~1400kcals/day. She has previously completed pulmonary rehab in February 2024; her starting weight at that time was 69.8kg/153.6#. She continues to work on weight loss by following a high protein/high fiber diet and tracking intake. She does acknowledge some emotional eating/binge type eating tendencies; she has started seeing behavioral health counselor. Answered patient questions about constipation, fiber ,etc. LDL is elevated and A1c is in a prediabetic range. Tiffanye would continue to benefit from weight loss and decrease intake of refined carbohydrates to support pulmonary disease. Goals in progress. Erine has medical history of pulmonary HTN, COPD3, chronic respiratory  failure. Per [facility] transplant RD documentation on 10/05/22, it is recommended that she lose weight to BMI 30/170# and ultimately BMI 27/150#. She reports following a high protein diet, tracking calories ~1400kcals/day. She continues to work on weight loss by following a high  protein/high fiber diet and tracking intake. She does acknowledge some emotional eating/binge type eating tendencies; she has started seeing behavioral health counselor. Answered patient questions about constipation, fiber ,etc. She is down 8.5 since highest weight starting pulmonary rehab at 80.7kg. LDL is elevated and A1c is in a prediabetic range. Deshia would continue to benefit from weight loss and decrease intake of refined carbohydrates to support pulmonary disease.    Row Name 08/15/23 1202             Goals   Current Weight 167 lb 15.9 oz (76.2 kg)       Comment total cholesterol 222, LDL 139, A1c 5.9       Expected Outcome Goals in progress. Jaydeen has medical history of pulmonary HTN, COPD3, chronic respiratory failure. Per [facility] transplant RD documentation on 10/05/22, it is recommended that she lose weight to BMI 30/170# and ultimately BMI 27/150#. She reports following a high protein diet, tracking calories ~1400kcals/day. She continues to work on weight loss by following a high protein/high fiber diet and tracking intake. She does acknowledge some emotional eating/binge type eating tendencies; she has started seeing behavioral health counselor. Answered patient questions about constipation, fiber ,etc. She is down 10# since highest weight starting pulmonary rehab at 80.7kg. LDL is elevated and A1c is in a prediabetic range. Tinika would continue to benefit from weight loss and decrease intake of refined carbohydrates to support pulmonary disease.          Nutrition Goals Discharge (Final Nutrition Goals Re-Evaluation):  Nutrition Goals Re-Evaluation - 08/15/23 1202       Goals   Current Weight 167 lb 15.9 oz (76.2  kg)    Comment total cholesterol 222, LDL 139, A1c 5.9    Expected Outcome Goals in progress. Oksana has medical history of pulmonary HTN, COPD3, chronic respiratory failure. Per [facility] transplant RD documentation on 10/05/22, it is recommended that she lose weight to BMI 30/170# and ultimately BMI 27/150#. She reports following a high protein diet, tracking calories ~1400kcals/day. She continues to work on weight loss by following a high protein/high fiber diet and tracking intake. She does acknowledge some emotional eating/binge type eating tendencies; she has started seeing behavioral health counselor. Answered patient questions about constipation, fiber ,etc. She is down 10# since highest weight starting pulmonary rehab at 80.7kg. LDL is elevated and A1c is in a prediabetic range. Makeba would continue to benefit from weight loss and decrease intake of refined carbohydrates to support pulmonary disease.          Psychosocial: Target Goals: Acknowledge presence or absence of significant depression and/or stress, maximize coping skills, provide positive support system. Participant is able to verbalize types and ability to use techniques and skills needed for reducing stress and depression.  Initial Review & Psychosocial Screening:   Quality of Life Scores:  Scores of 19 and below usually indicate a poorer quality of life in these areas.  A difference of  2-3 points is a clinically meaningful difference.  A difference of 2-3 points in the total score of the Quality of Life Index has been associated with significant improvement in overall quality of life, self-image, physical symptoms, and general health in studies assessing change in quality of life.  PHQ-9: Review Flowsheet  More data exists      08/24/2023 07/25/2023 06/22/2023 06/07/2023 03/29/2023  Depression screen PHQ 2/9  Decreased Interest  0     Down, Depressed, Hopeless  0     PHQ - 2  Score  0     Altered sleeping  1     Tired,  decreased energy  1     Change in appetite  0     Feeling bad or failure about yourself   1     Trouble concentrating  0     Moving slowly or fidgety/restless  0     Suicidal thoughts  0     PHQ-9 Score  3     Difficult doing work/chores  Not difficult at all       Details       Information is confidential and restricted. Go to Review Flowsheets to unlock data.        Interpretation of Total Score  Total Score Depression Severity:  1-4 = Minimal depression, 5-9 = Mild depression, 10-14 = Moderate depression, 15-19 = Moderately severe depression, 20-27 = Severe depression   Psychosocial Evaluation and Intervention:   Psychosocial Re-Evaluation:  Psychosocial Re-Evaluation     Row Name 03/15/23 1118 04/10/23 1429 05/10/23 0845 06/09/23 9173 06/29/23 9155     Psychosocial Re-Evaluation   Current issues with Current Depression;History of Depression;Current Psychotropic Meds;Current Stress Concerns Current Depression;History of Depression;Current Psychotropic Meds;Current Stress Concerns Current Depression;History of Depression;Current Psychotropic Meds;Current Stress Concerns Current Depression;History of Depression;Current Psychotropic Meds;Current Stress Concerns Current Psychotropic Meds;History of Depression   Comments Philip denies any new psychosocial barriers or concerns at this time. She is still working with her therapist and taking her psychotropic meds. Rei denies any new psychosocial barriers or concerns at this time. She is still working with her therapist and taking her psychotropic meds. Joley denies any new psychosocial barriers or concerns at this time. She has made some friends in class and this has seem to brighten her spirits. She also has been losing weight which has helped her mental health. She is compliant with taking her psychotropic meds and is still working with her mental health specialist. Yomira is currently dealing with new stress and anxiety over recent  heart issues. She is still working with her therapist and is compliant with taking her psychotropic meds. She denies any needs at this time. Dinora denies any new psychosocial barriers or concerns at this time. She is compliant with taking her psychotropic meds and continues to work with her therapist.   Expected Outcomes For Nastassja to attend the program with less psychosocial issues or concerns. For Zira to attend the program with less psychosocial issues or concerns. For Kelty to attend the program with less psychosocial issues or concerns. For Marciana to attend the program with less psychosocial issues or concerns. For Krystal to attend the program with less psychosocial issues or concerns.   Interventions Encouraged to attend Pulmonary Rehabilitation for the exercise Encouraged to attend Pulmonary Rehabilitation for the exercise Encouraged to attend Pulmonary Rehabilitation for the exercise -- Encouraged to attend Pulmonary Rehabilitation for the exercise   Continue Psychosocial Services  No Follow up required No Follow up required No Follow up required No Follow up required No Follow up required    Row Name 08/02/23 1303 08/29/23 1546           Psychosocial Re-Evaluation   Current issues with Current Psychotropic Meds;History of Depression Current Psychotropic Meds;History of Depression      Comments Tyrihanna denies any new psychosocial barriers or concerns at this time. She is compliant with taking her psychotropic meds and continues to work with her therapist. Monthly psychosocial re-evaluation as follows: Lenell denies any new psychosocial barriers  or concerns at this time. She is compliant with taking her psychotropic meds and continues to work with her therapist.      Expected Outcomes For Halsey to attend the program with less psychosocial issues or concerns. For Lezly to attend the program with less psychosocial issues or concerns.      Interventions Encouraged to attend Pulmonary  Rehabilitation for the exercise Encouraged to attend Pulmonary Rehabilitation for the exercise      Continue Psychosocial Services  No Follow up required No Follow up required         Psychosocial Discharge (Final Psychosocial Re-Evaluation):  Psychosocial Re-Evaluation - 08/29/23 1546       Psychosocial Re-Evaluation   Current issues with Current Psychotropic Meds;History of Depression    Comments Monthly psychosocial re-evaluation as follows: Mazell denies any new psychosocial barriers or concerns at this time. She is compliant with taking her psychotropic meds and continues to work with her therapist.    Expected Outcomes For Kristian to attend the program with less psychosocial issues or concerns.    Interventions Encouraged to attend Pulmonary Rehabilitation for the exercise    Continue Psychosocial Services  No Follow up required          Education: Education Goals: Education classes will be provided on a weekly basis, covering required topics. Participant will state understanding/return demonstration of topics presented.  Learning Barriers/Preferences:   Education Topics: [employer] instruction that is supported by a PowerPoint presentation. Instructor discusses importance of knowing and understanding resting, exercise, and post-exercise oxygen  saturation, heart rate, and blood pressure. Oxygen  saturation, heart rate, blood pressure, rating of perceived exertion, and dyspnea are reviewed along with a normal range for these values.  Flowsheet Row PULMONARY REHAB CHRONIC OBSTRUCTIVE PULMONARY DISEASE from [facility]29/[facility] in [facility] [place] [facility]  Date 06/29/23  Educator EP  Instruction Review Code 1- Verbalizes Understanding    Exercise for the Pulmonary Patient Group instruction that is supported by a PowerPoint presentation. Instructor discusses benefits of exercise, core components of exercise, frequency, duration, and  intensity of an exercise routine, importance of utilizing pulse oximetry during exercise, safety while exercising, and options of places to exercise outside of rehab.  Flowsheet Row PULMONARY REHAB CHRONIC OBSTRUCTIVE PULMONARY DISEASE from 06/22/2023 in [facility] for [facility], V[facility], & Lung Health  Date 06/22/23  Educator EP  Instruction Review Code 1- Verbalizes Understanding    MET Level  Group instruction provided by PowerPoint, verbal discussion, and written material to support subject matter. Instructor reviews what METs are and how to increase METs.  Flowsheet Row PULMONARY REHAB CHRONIC OBSTRUCTIVE PULMONARY DISEASE from 08/17/2023 in [facility] for [facility], Vascular, & Lung Health  Date 08/17/23  Educator EP  Instruction Review Code 1- Verbalizes Understanding    Pulmonary Medications Verbally interactive group education provided by instructor with focus on inhaled medications and proper administration. Flowsheet Row PULMONARY REHAB CHRONIC OBSTRUCTIVE PULMONARY DISEASE from 06/15/2023 in [facility], [facility]  Date 06/15/23  Educator RT  Instruction Review Code 1- Verbalizes Understanding    Anatomy and Physiology of the Respiratory System Group instruction provided by PowerPoint, verbal discussion, and written material to support subject matter. Instructor reviews respiratory cycle and anatomical components of the respiratory system and their functions. Instructor also reviews differences in obstructive and restrictive respiratory diseases with examples of each.  Flowsheet Row PULMONARY REHAB CHRONIC OBSTRUCTIVE  PULMONARY DISEASE from 12/15/2022 in [facility] for [facility], Vascular, & Lung Health  Date 12/15/22  Educator RT  Instruction Review Code 1- Verbalizes Understanding    Oxygen  Safety Group instruction provided by PowerPoint, verbal discussion, and  written material to support subject matter. There is an overview of "What is Oxygen " and "Why do we need it".  Instructor also reviews how to create a safe environment for oxygen  use, the importance of using oxygen  as prescribed, and the risks of noncompliance. There is a brief discussion on traveling with oxygen  and resources the patient may utilize. Flowsheet Row PULMONARY REHAB CHRONIC OBSTRUCTIVE PULMONARY DISEASE from 07/06/2023 in [facility]  Date 07/06/23  Educator RN  Instruction Review Code 1- Verbalizes Understanding    Oxygen  Use Group instruction provided by PowerPoint, verbal discussion, and written material to discuss how supplemental oxygen  is prescribed and different types of oxygen  supply systems. Resources for more information are provided.  Flowsheet Row PULMONARY REHAB CHRONIC OBSTRUCTIVE PULMONARY DISEASE from 01/19/2023 in [facility] for Heart, Vascular, & Lung Health  Date 01/19/23  Educator RT  Instruction Review Code 1- Verbalizes Understanding    Breathing Techniques Group instruction that is supported by demonstration and informational handouts. Instructor discusses the benefits of pursed lip and diaphragmatic breathing and detailed demonstration on how to perform both.  Flowsheet Row PULMONARY REHAB CHRONIC OBSTRUCTIVE PULMONARY DISEASE from 07/20/2023 in [facility] for [facility], Vascular, & Lung Health  Date 07/20/23  Educator RN  Instruction Review Code 1- Verbalizes Understanding     Risk Factor Reduction Group instruction that is supported by a PowerPoint presentation. Instructor discusses the definition of a risk factor, different risk factors for pulmonary disease, and how the heart and lungs work together. Flowsheet Row PULMONARY REHAB CHRONIC OBSTRUCTIVE PULMONARY DISEASE from 05/18/2023 in [facility] for Heart, Vascular, & Lung  Health  Date 05/18/23  Educator EP  Instruction Review Code 1- Verbalizes Understanding    Pulmonary Diseases Group instruction provided by PowerPoint, verbal discussion, and written material to support subject matter. Instructor gives an overview of the different type of pulmonary diseases. There is also a discussion on risk factors and symptoms as well as ways to manage the diseases. Flowsheet Row PULMONARY REHAB CHRONIC OBSTRUCTIVE PULMONARY DISEASE from 08/24/2023 in [facility] for Heart, Vascular, & Lung Health  Date 08/24/23  Educator RT  Instruction Review Code 1- Verbalizes Understanding    Stress and Energy Conservation Group instruction provided by PowerPoint, verbal discussion, and written material to support subject matter. Instructor gives an overview of stress and the impact it can have on the body. Instructor also reviews ways to reduce stress. There is also a discussion on energy conservation and ways to conserve energy throughout the day. Flowsheet Row PULMONARY REHAB CHRONIC OBSTRUCTIVE PULMONARY DISEASE from 07/27/2023 in [facility] for Heart, Vascular, & Lung Health  Date 07/27/23  Educator RN  Instruction Review Code 1- Verbalizes Understanding    Warning Signs and Symptoms Group instruction provided by PowerPoint, verbal discussion, and written material to support subject matter. Instructor reviews warning signs and symptoms of stroke, heart attack, cold and flu. Instructor also reviews ways to prevent the spread of infection. Flowsheet Row PULMONARY REHAB CHRONIC OBSTRUCTIVE PULMONARY DISEASE from 05/11/2023 in [facility] for Heart, Vascular, & Lung Health  Date 05/11/23  Educator  RN  Instruction Review Code 1- Verbalizes Understanding    Other Education Group or individual verbal, written, or video instructions that support the educational goals of the pulmonary rehab program. Flowsheet Row  PULMONARY REHAB CHRONIC OBSTRUCTIVE PULMONARY DISEASE from 03/16/2023 in [facility] for Heart, Vascular, & Lung Health  Date 03/16/23  Educator EP  Instruction Review Code 1- Verbalizes Understanding     Knowledge Questionnaire Score:  Knowledge Questionnaire Score - 07/25/23 1634       Knowledge Questionnaire Score   Post Score 18/18          Core Components/Risk Factors/Patient Goals at Admission:   Core Components/Risk Factors/Patient Goals Review:   Goals and Risk Factor Review     Row Name 03/15/23 1120 04/10/23 1430 05/10/23 0848 06/09/23 0904 06/29/23 0845     Core Components/Risk Factors/Patient Goals Review   Personal Goals Review Weight Management/Obesity;Improve shortness of breath with ADL's;Stress Weight Management/Obesity;Improve shortness of breath with ADL's Weight Management/Obesity;Improve shortness of breath with ADL's Weight Management/Obesity;Improve shortness of breath with ADL's Weight Management/Obesity;Improve shortness of breath with ADL's   Review Goal progressing for weight loss. Skylarr is working with the staff dietician to obatain her weight loss goals. Goal progressing on improving shortness of breath with ADL's. Goal met on decreasing stress. She states that exercising has helped reduce her stress and she feels like her stress level is under control. Luana is requiring 2L of O2 to maintain sats >88% while exercising. She is currently exercising on the recumbant elliptical and the treadmill. We will continue to monitor Jill Glenn's progress throughout the program. Monthly review of patient's Core Components/Risk Factors/Patient Goals are as follows: Goal progressing for weight loss. Jill Glenn is working with the staff dietitian to obatain her weight loss goals. Goal progressing on improving shortness of breath with ADL's. Jill Glenn is requiring 2L of O2 to maintain sats >88% while exercising. She is currently exercising on the recumbant  elliptical and the treadmill. We will continue to monitor Jill Glenn's progress throughout the program. Monthly review of patient's Core Components/Risk Factors/Patient Goals are as follows: Goal progressing for weight loss. Jill Glenn has been applying the education she has receved from the dietitician as well as using weight watchers to help facilitate weight loss. Goal progressing on improving shortness of breath with ADL's. Jill Glenn is requiring 2L of O2 to maintain sats >88% while exercising. She is currently exercising on the recumbant elliptical and the treadmill. We will continue to monitor Jill Glenn's progress throughout the program. Monthly review of patient's Core Components/Risk Factors/Patient Goals are as follows: Goal progressing for weight loss. Zoejane has been applying the education she has received from the dietitician as well as using weight watchers to help facilitate weight loss. Goal progressing on improving shortness of breath with ADL's. Jill Glenn is requiring 2L of O2 to maintain sats >88% while exercising. She is currently exercising on the recumbant elliptical and the treadmill. We will continue to monitor Jill Glenn's progress throughout the program. Monthly review of patient's Core Components/Risk Factors/Patient Goals are as follows: Goal progressing for weight loss. Jill Glenn has been applying the education she has received from the dietitician as well as using weight watchers to help facilitate weight loss. Goal progressing on improving shortness of breath with ADL's. Jill Glenn is requiring 2L of O2 to maintain sats >88% while exercising. She is currently exercising on the recumbant elliptical and the treadmill. We will continue to monitor Jill Glenn's progress throughout the program.   Expected Outcomes For Nyeema to lose weight  and improve her SOB with ADLs For Cheetara to lose weight and improve her SOB with ADLs For Kadeja to lose weight and improve her SOB with ADLs For Isel to lose weight and improve her  SOB with ADLs For Myrl to lose weight and improve her SOB with ADLs    Row Name 08/02/23 8696 08/29/23 1546           Core Components/Risk Factors/Patient Goals Review   Personal Goals Review Weight Management/Obesity;Improve shortness of breath with ADL's Weight Management/Obesity;Improve shortness of breath with ADL's      Review Monthly review of patient's Core Components/Risk Factors/Patient Goals are as follows: Goal progressing for weight loss. Inara has been applying the education she has received from the dietitician as well as using weight watchers to help facilitate weight loss. Goal progressing on improving shortness of breath with ADL's. Zia is requiring 2-3L of O2 to maintain sats >88% while exercising. She is currently exercising on the recumbant elliptical and the treadmill. We will continue to monitor Lillianna's progress throughout the program. Monthly review of patient's Core Components/Risk Factors/Patient Goals are as follows: Goal progressing for weight loss. Merve has been applying the education she has received from the dietitician as well as using weight watchers to help facilitate weight loss. She is down ~11# since starting the program last October. Goal progressing on improving shortness of breath with ADL's. Markasia is requiring 2-3L of O2 to maintain sats >88% while exercising. She is currently exercising on the recumbant elliptical and the treadmill. We will continue to monitor Birda's progress throughout the program.      Expected Outcomes For Ida to lose weight and improve her SOB with ADLs For Jourden to lose weight and improve her SOB with ADLs         Core Components/Risk Factors/Patient Goals at Discharge (Final Review):   Goals and Risk Factor Review - 08/29/23 1546       Core Components/Risk Factors/Patient Goals Review   Personal Goals Review Weight Management/Obesity;Improve shortness of breath with ADL's    Review Monthly review of patient's Core  Components/Risk Factors/Patient Goals are as follows: Goal progressing for weight loss. Gem has been applying the education she has received from the dietitician as well as using weight watchers to help facilitate weight loss. She is down ~11# since starting the program last October. Goal progressing on improving shortness of breath with ADL's. Kachina is requiring 2-3L of O2 to maintain sats >88% while exercising. She is currently exercising on the recumbant elliptical and the treadmill. We will continue to monitor Ilhan's progress throughout the program.    Expected Outcomes For Shamell to lose weight and improve her SOB with ADLs          ITP Comments: Pt is making expected progress toward Pulmonary Rehab goals after completing 60 session(s). Recommend continued exercise, life style modification, education, and utilization of breathing techniques to increase stamina and strength, while also decreasing shortness of breath with exertion.  Dr. Slater Staff is Medical Director for Pulmonary Rehab at [facility].

## 2023-09-06 NOTE — Telephone Encounter (Signed)
**Note De-identified  [place] Obfuscation** Please advise 

## 2023-09-07 ENCOUNTER — Ambulatory Visit (HOSPITAL_COMMUNITY): Admitting: Licensed Clinical Social Worker

## 2023-09-07 ENCOUNTER — Encounter (HOSPITAL_COMMUNITY)

## 2023-09-07 NOTE — Progress Notes (Signed)
 THERAPIST PROGRESS NOTE   Session Date: 09/07/2023  Session Time: 1100  Patient no-showed today's appointment; appointment was for bi-weekly follow up therapy, provider notified for review of record.   [name], MSW, LCSW 09/07/2023,  12:12 PM

## 2023-09-10 NOTE — Procedures (Addendum)
  Indications for Polysomnography The patient is a 58 year-old Female who is 5' 3 and weighs 167.0 lbs. Her BMI equals 29.6.  A full night polysomnogram was performed to evaluate for Obstructive Sleep Apnea.  MedicationNo Data. Polysomnogram Data A full night polysomnogram recorded the standard physiologic parameters including EEG, EOG, EMG, EKG, nasal and oral airflow.  Respiratory parameters of chest and abdominal movements were recorded with Respiratory Inductance Plethysmography belts.   Oxygen  saturation was recorded by pulse oximetry.  Sleep Architecture The total recording time of the polysomnogram was 396.4 minutes.  The total sleep time was 304.0 minutes.  The patient spent 3.8% of total sleep time in Stage N1, 74.0% in Stage N2, 3.6% in Stages N3, and 18.6% in REM.  Sleep latency was 32.5 minutes.   REM latency was 156.0 minutes.  Sleep Efficiency was 76.7%.  Wake after Sleep Onset time was 60.0 minutes.  Respiratory Events The polysomnogram revealed a presence of - obstructive, 0 central, and 0 mixed apneas resulting in an Apnea index of 0 events per hour.  There were 1 hypopneas (GreaterEqual to3% desaturation and/or arousal) resulting in an Apnea\Hypopnea Index (AHI  GreaterEqual to3% desaturation and/or arousal) of 0.2 events per hour.  There were 0 hypopneas (GreaterEqual to4% desaturation) resulting in an Apnea\Hypopnea Index (AHI GreaterEqual to4% desaturation) of 0 events per hour.  There were 0 Respiratory  Effort Related Arousals resulting in a RERA index of 0 events per hour. The Respiratory Disturbance Index is 0.2 events per hour.  The snore index was 35.9 events per hour.  Mean oxygen  saturation was 97.9%.  The lowest oxygen  saturation during sleep was 95.0%.  Time spent LessEqual to88% oxygen  saturation was 0 minutes.  Limb Activity There were 21 total limb movements recorded, of this total, 0 were classified as PLMs.  PLM index was 0 per hour and PLM associated with  Arousals index was 0 per hour.  Cardiac Summary The average pulse rate was 56.6 bpm.  The minimum pulse rate was 49.0 bpm while the maximum pulse rate was 78.0 bpm.  Cardiac rhythm was Normal.  Diagnosis: Normal Sleep Study  Recommendations: 1. Normal study with no significant sleep disordered breathing. 2. Healthy sleep recommendations include: adequate nightly sleep (normal 7-9 hrs/night), avoidance of caffeine after noon and alcohol near bedtime, and maintaining a sleep environment that is cool, dark and quiet. 3. Weight loss for overweight patients is recommended. 4. Snoring recommendations include: weight loss where appropriate, side sleeping, and avoidance of alcohol before bed. 5. Operation of motor vehicle or dangerous equipment must be avoided when feeling drowsy, excessively sleepy, or mentally fatigued. 6. An ENT consultation which may be useful for specific causes of and possible treatment of bothersome snoring . 7. Weight loss may be of benefit in reducing the severity of snoring   This study was personally reviewed and electronically signed by: Wilbert Bihari MD Accredited Board Certified in Sleep Medicine Date/Time: 09/10/2023 4:35PM

## 2023-09-11 ENCOUNTER — Ambulatory Visit: Payer: Self-pay | Admitting: Internal Medicine

## 2023-09-11 ENCOUNTER — Encounter: Payer: Self-pay | Admitting: Internal Medicine

## 2023-09-11 ENCOUNTER — Telehealth: Payer: Self-pay | Admitting: *Deleted

## 2023-09-11 NOTE — Telephone Encounter (Signed)
-----   Message from Wilbert Bihari sent at 09/10/2023  4:59 PM EDT ----- Please let patient know that sleep study showed no significant sleep apnea.

## 2023-09-11 NOTE — Telephone Encounter (Signed)
The patient has been notified of the result. Left detailed message on voicemail and informed patient to call back..Syniyah Bourne Green, CMA   

## 2023-09-12 ENCOUNTER — Encounter: Payer: Self-pay | Admitting: Internal Medicine

## 2023-09-12 ENCOUNTER — Encounter (HOSPITAL_COMMUNITY)
Admission: RE | Admit: 2023-09-12 | Discharge: 2023-09-12 | Disposition: A | Discharge status: Home / Self Care | Source: Ambulatory Visit | Attending: Internal Medicine | Admitting: Internal Medicine

## 2023-09-12 ENCOUNTER — Telehealth (HOSPITAL_COMMUNITY): Payer: Self-pay

## 2023-09-12 DIAGNOSIS — J449 Chronic obstructive pulmonary disease, unspecified: Secondary | ICD-10-CM | POA: Insufficient documentation

## 2023-09-12 DIAGNOSIS — R079 Chest pain, unspecified: Secondary | ICD-10-CM | POA: Insufficient documentation

## 2023-09-12 NOTE — Telephone Encounter (Signed)
 Patient left message at 7:32am calling out for 8:15am [facility] class, no reason given. She would like Kaylee to call her.

## 2023-09-12 NOTE — Telephone Encounter (Signed)
 Returned call from [place]. Kaedyn wants to be discharged from [facility]. She has become increasingly busy. Pt understands how to get new referral. Will discharge pt from [facility].

## 2023-09-14 ENCOUNTER — Ambulatory Visit (HOSPITAL_COMMUNITY): Admitting: Licensed Clinical Social Worker

## 2023-09-14 ENCOUNTER — Encounter (HOSPITAL_COMMUNITY)

## 2023-09-14 NOTE — Progress Notes (Signed)
 Discharge Progress Report  Patient Details  Name: Jill Glenn MRN: [id number] Date of Birth: 05-03-1965 Referring Provider:   [facility] Test from 10/26/2022 in [facility]  Referring Provider Desai     Number of Visits: 23  Reason for Discharge:  Patient has met program and personal goals.  Smoking History:  Social History   Tobacco Use  Smoking Status Former   Current packs/day: 0.00   Average packs/day: 1 pack/day for 40.6 years (40.6 ttl pk-yrs)   Types: Cigarettes   Start date: 03/05/1981   Quit date: 10/14/2021   Years since quitting: 1.9  Smokeless Tobacco Never    Diagnosis:  Stage 3 severe COPD by GOLD classification (HCC)  ADL [facility]:  Pulmonary Assessment Scores     Row Name 07/25/23 1634         ADL [facility]   ADL Phase Exit     SOB Score total 66       CAT Score   CAT Score 20        Initial Exercise Prescription:   Discharge Exercise Prescription (Final Exercise Prescription Changes):  Exercise Prescription Changes - 08/29/23 0905       Response to Exercise   Blood Pressure (Admit) 94/60    Blood Pressure (Exit) 100/58    Heart Rate (Admit) 67 bpm    Heart Rate (Exercise) 95 bpm    Heart Rate (Exit) 86 bpm    Oxygen  Saturation (Admit) 95 %    Oxygen  Saturation (Exercise) 94 %    Oxygen  Saturation (Exit) 97 %    Rating of Perceived Exertion (Exercise) 13    Perceived Dyspnea (Exercise) 2    Duration Continue with 30 min of aerobic exercise without signs/symptoms of physical distress.    Intensity THRR unchanged      Progression   Progression Continue to progress workloads to maintain intensity without signs/symptoms of physical distress.      Resistance Training   Training Prescription Yes    Weight black bands    Reps 10-15    Time 10 Minutes      Interval Training   Interval Training Yes    Equipment Treadmill    Comments  40min@2 .81mph&3.5%incline METS 4.1, 75min@2 .23mph&0%incline METS 2.6      Oxygen    Oxygen  Continuous    Liters 2-3      Recumbant Elliptical   Level 5    Minutes 15    METs 4      Oxygen    Maintain Oxygen  Saturation 88% or higher          Functional Capacity:   Psychological, QOL, Others - Outcomes: PHQ 2/9:    08/24/2023    3:44 PM 07/25/2023    4:33 PM 06/22/2023    2:23 PM 06/07/2023    1:50 PM 03/29/2023   11:14 AM  Depression screen PHQ 2/9  Decreased Interest  0     Down, Depressed, Hopeless  0     PHQ - 2 Score  0     Altered sleeping  1     Tired, decreased energy  1     Change in appetite  0     Feeling bad or failure about yourself   1     Trouble concentrating  0     Moving slowly or fidgety/restless  0     Suicidal thoughts  0     PHQ-9 Score  3  Difficult doing work/chores  Not difficult at all        Information is confidential and restricted. Go to Review Flowsheets to unlock data.    Quality of Life:   Personal Goals: Goals established at orientation with interventions provided to work toward goal.    Personal Goals Discharge:  Goals and Risk Factor Review     Row Name 04/10/23 1430 05/10/23 0848 06/09/23 0904 06/29/23 0845 08/02/23 1303     Core Components/Risk Factors/Patient Goals Review   Personal Goals Review Weight Management/Obesity;Improve shortness of breath with ADL's Weight Management/Obesity;Improve shortness of breath with ADL's Weight Management/Obesity;Improve shortness of breath with ADL's Weight Management/Obesity;Improve shortness of breath with ADL's Weight Management/Obesity;Improve shortness of breath with ADL's   Review Monthly review of patient's Core Components/Risk Factors/Patient Goals are as follows: Goal progressing for weight loss. Adaijah is working with the staff dietitian to obatain her weight loss goals. Goal progressing on improving shortness of breath with ADL's. Aurilla is requiring 2L of O2 to maintain sats >88%  while exercising. She is currently exercising on the recumbant elliptical and the treadmill. We will continue to monitor Sierria's progress throughout the program. Monthly review of patient's Core Components/Risk Factors/Patient Goals are as follows: Goal progressing for weight loss. Odesser has been applying the education she has receved from the dietitician as well as using weight watchers to help facilitate weight loss. Goal progressing on improving shortness of breath with ADL's. Ginnifer is requiring 2L of O2 to maintain sats >88% while exercising. She is currently exercising on the recumbant elliptical and the treadmill. We will continue to monitor Edra's progress throughout the program. Monthly review of patient's Core Components/Risk Factors/Patient Goals are as follows: Goal progressing for weight loss. Kendrah has been applying the education she has received from the dietitician as well as using weight watchers to help facilitate weight loss. Goal progressing on improving shortness of breath with ADL's. Franchon is requiring 2L of O2 to maintain sats >88% while exercising. She is currently exercising on the recumbant elliptical and the treadmill. We will continue to monitor Shakeena's progress throughout the program. Monthly review of patient's Core Components/Risk Factors/Patient Goals are as follows: Goal progressing for weight loss. Crestina has been applying the education she has received from the dietitician as well as using weight watchers to help facilitate weight loss. Goal progressing on improving shortness of breath with ADL's. Nandi is requiring 2L of O2 to maintain sats >88% while exercising. She is currently exercising on the recumbant elliptical and the treadmill. We will continue to monitor Elleanna's progress throughout the program. Monthly review of patient's Core Components/Risk Factors/Patient Goals are as follows: Goal progressing for weight loss. Tanairi has been applying the education she has  received from the dietitician as well as using weight watchers to help facilitate weight loss. Goal progressing on improving shortness of breath with ADL's. Shawnette is requiring 2-3L of O2 to maintain sats >88% while exercising. She is currently exercising on the recumbant elliptical and the treadmill. We will continue to monitor Dalayna's progress throughout the program.   Expected Outcomes For Tamre to lose weight and improve her SOB with ADLs For Lavette to lose weight and improve her SOB with ADLs For Jeree to lose weight and improve her SOB with ADLs For Hilari to lose weight and improve her SOB with ADLs For Niaomi to lose weight and improve her SOB with ADLs    Row Name 08/29/23 8453 09/14/23 9141  Core Components/Risk Factors/Patient Goals Review   Personal Goals Review Weight Management/Obesity;Improve shortness of breath with ADL's Weight Management/Obesity;Improve shortness of breath with ADL's      Review Monthly review of patient's Core Components/Risk Factors/Patient Goals are as follows: Goal progressing for weight loss. Jaleiah has been applying the education she has received from the dietitician as well as using weight watchers to help facilitate weight loss. She is down ~11# since starting the program last October. Goal progressing on improving shortness of breath with ADL's. Sukaina is requiring 2-3L of O2 to maintain sats >88% while exercising. She is currently exercising on the recumbant elliptical and the treadmill. We will continue to monitor Nohelia's progress throughout the program. Mao was discharged from the [facility] program on 09/13/23. She met her goal for weight loss. She is down 11#'s at the time of discharge. Goal for improving shortness of breath with ADL's met. Eliannah did great during the program. We wish her the best.      Expected Outcomes For Jeanenne to lose weight and improve her SOB with ADLs To continue to exercise and modify her nutrition and lifestyle post  graduation         Exercise Goals and Review:   Exercise Goals Re-Evaluation:  Exercise Goals Re-Evaluation     Row Name 03/20/23 0903 04/12/23 1047 05/12/23 0939 06/09/23 0924 07/06/23 0728     Exercise Goal Re-Evaluation   Exercise Goals Review Increase Physical Activity;Able to understand and use Dyspnea scale;Understanding of Exercise Prescription;Increase Strength and Stamina;Knowledge and understanding of Target Heart Rate Range (THRR);Able to understand and use rate of perceived exertion (RPE) scale Increase Physical Activity;Able to understand and use Dyspnea scale;Understanding of Exercise Prescription;Increase Strength and Stamina;Knowledge and understanding of Target Heart Rate Range (THRR);Able to understand and use rate of perceived exertion (RPE) scale Increase Physical Activity;Able to understand and use Dyspnea scale;Understanding of Exercise Prescription;Increase Strength and Stamina;Knowledge and understanding of Target Heart Rate Range (THRR);Able to understand and use rate of perceived exertion (RPE) scale Increase Physical Activity;Able to understand and use Dyspnea scale;Understanding of Exercise Prescription;Increase Strength and Stamina;Knowledge and understanding of Target Heart Rate Range (THRR);Able to understand and use rate of perceived exertion (RPE) scale Increase Physical Activity;Able to understand and use Dyspnea scale;Understanding of Exercise Prescription;Increase Strength and Stamina;Knowledge and understanding of Target Heart Rate Range (THRR);Able to understand and use rate of perceived exertion (RPE) scale   Comments Icess has completed 27 exercise sessions. She exercises for 15 min on the recumbent elliptical and treadmill. She averages 3.8 METs at level 4 on the recumbent elliptical and 3.6 METs on the treadmill. She performs the warmup and cooldown standing without limitations. Marisol has increased her speed and incline on the treadmill as her intervals have  changed. Kahlee does intervals on certain days. Will continue to monitor and progress as able. Shaquetta has completed 33 exercise sessions. She exercises for 15 min on the recumbent elliptical and treadmill. She averages 3.3 METs at level 5 on the recumbent elliptical and 2.2-3.6 METs on the treadmill. She performs the warmup and cooldown standing without limitations. Gabrille has increased her level on the recumbent elliptical as METs remain relatively the same. Her speed has increased in her high interval. She continue to tolerate progressions well. Will continue to monitor and progress as able. Bonne has completed 39 exercise sessions. She exercises for 15 min on the recumbent elliptical and treadmill. She averages 3.9 METs at level 5 on the recumbent elliptical and 1.8-2.3 METs on the  treadmill. She performs the warmup and cooldown standing without limitations. Vielka has increased her level on the recumbent elliptical as METs have increased. Her intervals on the treadmill have remained the relatively the same. Will continue to monitor and progress as able. Shavette has completed 43 exercise sessions. She exercises for 15 min on the recumbent elliptical and treadmill. She averages 4.1 METs at level 5 on the recumbent elliptical and 1.8-2.3 METs on the treadmill. She performs the warmup and cooldown standing without limitations. Nayzeth has increased her level on the recumbent elliptical as METs have increased. She still tolerates progressions well. Siham has missed several sessions due to other appointments. Will continue to monitor and progress as able. Nathaly has completed 49 exercise sessions. She exercises for 15 min on the recumbent elliptical and treadmill. She averages 4.1 METs at level 5 on the recumbent elliptical and 2.6-4.1 METs on the treadmill. She performs the warmup and cooldown standing without limitations. Laneta has increased her speed  and incline intervals on the treadmill. She tolerates this well  thus far. Will continue to monitor and progress as able.   Expected Outcomes Through exercise at rehab and home, the patient will decrease shortness of breath with daily activities and feel confident in carrying out an exercise regimen at home. Through exercise at rehab and home, the patient will decrease shortness of breath with daily activities and feel confident in carrying out an exercise regimen at home. Through exercise at rehab and home, the patient will decrease shortness of breath with daily activities and feel confident in carrying out an exercise regimen at home. Through exercise at rehab and home, the patient will decrease shortness of breath with daily activities and feel confident in carrying out an exercise regimen at home. Through exercise at rehab and home, the patient will decrease shortness of breath with daily activities and feel confident in carrying out an exercise regimen at home.    Row Name 08/02/23 9078 08/29/23 1624 09/13/23 0840         Exercise Goal Re-Evaluation   Exercise Goals Review Increase Physical Activity;Able to understand and use Dyspnea scale;Understanding of Exercise Prescription;Increase Strength and Stamina;Knowledge and understanding of Target Heart Rate Range (THRR);Able to understand and use rate of perceived exertion (RPE) scale Increase Physical Activity;Able to understand and use Dyspnea scale;Understanding of Exercise Prescription;Increase Strength and Stamina;Knowledge and understanding of Target Heart Rate Range (THRR);Able to understand and use rate of perceived exertion (RPE) scale Increase Physical Activity;Able to understand and use Dyspnea scale;Understanding of Exercise Prescription;Increase Strength and Stamina;Knowledge and understanding of Target Heart Rate Range (THRR);Able to understand and use rate of perceived exertion (RPE) scale     Comments Armonie has completed 55 exercise sessions. She exercises for 15 min on the recumbent elliptical and  treadmill. She averages 4.6 METs at level 5 on the recumbent elliptical and 2.6-4.1 METs on the treadmill. She performs the warmup and cooldown standing without limitations. Kamaryn's METs have slightly increased on the recumbent elliptical. Will continue to monitor and progress as able. Lechelle has completed 60 exercise sessions. She exercises for 15 min on the recumbent elliptical and treadmill. She averages 4.0 METs at level 5 on the recumbent elliptical and 2.6-4.1 METs on the treadmill. She performs the warmup and cooldown standing without limitations. Cyriah is graduating [employer] soon and plans to exercise at home. Katharina has completed 60 exercise sessions. Peak METs were 4.0 on the Octane and 4.1 on the treadmill. Pt plans to return to PR in  the future.     Expected Outcomes Through exercise at rehab and home, the patient will decrease shortness of breath with daily activities and feel confident in carrying out an exercise regimen at home. Through exercise at rehab and home, the patient will decrease shortness of breath with daily activities and feel confident in carrying out an exercise regimen at home. Through exercise at rehab and home, the patient will decrease shortness of breath with daily activities and feel confident in carrying out an exercise regimen at home.        Nutrition & Weight - Outcomes:    Nutrition:  Nutrition Therapy & Goals - 08/15/23 1202       Nutrition Therapy   Diet General Healthy Diet      Personal Nutrition Goals   Nutrition Goal Patient to improve dietary quality by using the plate method as a daily guide for meal planning to include lean protein/plant protein, fruits, vegetables, whole grains, and nonfat/low fat dairy as part of well balanced diet   goal in progress.   Personal Goal #2 Patient to identify strategies for weight loss of 0.5-2.0# per week of weight loss.   goal in progress.   Comments Goals in progress.  Lavanda has medical history of pulmonary HTN,  COPD3, chronic respiratory failure. Per [facility] transplant RD documentation on 10/05/22, it is recommended that she lose weight to BMI 30/170# and ultimately BMI 27/150#. She reports following a high protein diet, tracking calories ~1400kcals/day.  She continues to work on weight loss by following a high protein/high fiber diet and tracking intake. She does acknowledge some emotional eating/binge type eating tendencies; she has started seeing behavioral health counselor. Answered patient questions about constipation, fiber ,etc. She is down 10# since highest weight starting pulmonary rehab at 80.7kg. LDL is elevated and A1c is in a prediabetic range. Geovana would continue to benefit from weight loss and decrease intake of refined carbohydrates to support pulmonary disease.      Intervention Plan   Intervention Prescribe, educate and counsel regarding individualized specific dietary modifications aiming towards targeted core components such as weight, hypertension, lipid management, diabetes, heart failure and other comorbidities.;Nutrition handout(s) given to patient.    Expected Outcomes Short Term Goal: Understand basic principles of dietary content, such as calories, fat, sodium, cholesterol and nutrients.;Long Term Goal: Adherence to prescribed nutrition plan.          Nutrition Discharge:   Education Questionnaire Score:  Knowledge Questionnaire Score - 07/25/23 1634       Knowledge Questionnaire Score   Post Score 18/18          Goals reviewed with patient; copy given to patient.

## 2023-09-14 NOTE — Telephone Encounter (Signed)
**Note De-identified  [place] Obfuscation** Please advise 

## 2023-09-19 ENCOUNTER — Encounter (HOSPITAL_COMMUNITY)

## 2023-09-21 ENCOUNTER — Encounter (HOSPITAL_COMMUNITY): Payer: Self-pay

## 2023-09-21 ENCOUNTER — Ambulatory Visit (INDEPENDENT_AMBULATORY_CARE_PROVIDER_SITE_OTHER): Admitting: Licensed Clinical Social Worker

## 2023-09-21 ENCOUNTER — Encounter (HOSPITAL_COMMUNITY)

## 2023-09-21 DIAGNOSIS — Z91199 Patient's noncompliance with other medical treatment and regimen due to unspecified reason: Secondary | ICD-10-CM

## 2023-09-21 NOTE — Progress Notes (Signed)
 THERAPIST PROGRESS NOTE   Session Date: 09/21/2023  Session Time: 1100  Daphine L Starace    Clinician attempted to connect with patient for scheduled appointment via [facility] video, sending text request x3 with no response.     Attempt 1: Text: 1107   Attempt 2: Text: 1108    Attempt 3: Text: 1110   Disconnected video visit at:  1111     Per [place] policy, after multiple attempts to reach patient unsuccessfully at appointed time, visit will be coded as a no show.  [name], MSW, LCSW 09/21/2023,  11:11 AM

## 2023-09-26 ENCOUNTER — Encounter (HOSPITAL_COMMUNITY)

## 2023-09-28 ENCOUNTER — Encounter (HOSPITAL_COMMUNITY)

## 2023-10-03 ENCOUNTER — Encounter (HOSPITAL_COMMUNITY)

## 2023-10-03 DIAGNOSIS — R0602 Shortness of breath: Secondary | ICD-10-CM | POA: Diagnosis not present

## 2023-10-03 DIAGNOSIS — J449 Chronic obstructive pulmonary disease, unspecified: Secondary | ICD-10-CM | POA: Diagnosis not present

## 2023-10-03 DIAGNOSIS — J432 Centrilobular emphysema: Secondary | ICD-10-CM | POA: Diagnosis not present

## 2023-10-05 ENCOUNTER — Encounter (HOSPITAL_COMMUNITY)

## 2023-10-05 ENCOUNTER — Ambulatory Visit (HOSPITAL_COMMUNITY): Admitting: Licensed Clinical Social Worker

## 2023-10-05 DIAGNOSIS — F411 Generalized anxiety disorder: Secondary | ICD-10-CM

## 2023-10-05 DIAGNOSIS — F331 Major depressive disorder, recurrent, moderate: Secondary | ICD-10-CM | POA: Diagnosis not present
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
## 2023-10-06 ENCOUNTER — Telehealth (INDEPENDENT_AMBULATORY_CARE_PROVIDER_SITE_OTHER): Admitting: Student

## 2023-10-06 DIAGNOSIS — F329 Major depressive disorder, single episode, unspecified: Secondary | ICD-10-CM | POA: Diagnosis not present

## 2023-10-06 DIAGNOSIS — F4310 Post-traumatic stress disorder, unspecified: Secondary | ICD-10-CM | POA: Diagnosis not present

## 2023-10-06 DIAGNOSIS — F411 Generalized anxiety disorder: Secondary | ICD-10-CM

## 2023-10-06 DIAGNOSIS — G4709 Other insomnia: Secondary | ICD-10-CM

## 2023-10-06 MED ORDER — CITALOPRAM HYDROBROMIDE 20 MG PO TABS
20.0000 mg | ORAL_TABLET | Freq: Every day | ORAL | 1 refills | Status: AC
Start: 2023-10-06 — End: ?

## 2023-10-06 MED ORDER — TRAZODONE HCL 50 MG PO TABS: 25.0000 mg | ORAL_TABLET | Freq: Every evening | ORAL | 1 refills | Status: AC | PRN

## 2023-10-06 MED ORDER — BUSPIRONE HCL 15 MG PO TABS: 15.0000 mg | ORAL_TABLET | Freq: Two times a day (BID) | ORAL | 1 refills | Status: AC
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
## 2023-10-10 ENCOUNTER — Encounter (HOSPITAL_COMMUNITY)

## 2023-10-12 ENCOUNTER — Encounter (HOSPITAL_COMMUNITY)

## 2023-10-16 ENCOUNTER — Encounter: Payer: Self-pay | Admitting: Internal Medicine

## 2023-10-16 ENCOUNTER — Ambulatory Visit: Admitting: Internal Medicine

## 2023-10-16 VITALS — BP 94/76 | HR 71 | Temp 98.0°F | Ht 63.0 in | Wt 163.0 lb

## 2023-10-16 DIAGNOSIS — J4489 Other specified chronic obstructive pulmonary disease: Secondary | ICD-10-CM

## 2023-10-16 DIAGNOSIS — J301 Allergic rhinitis due to pollen: Secondary | ICD-10-CM | POA: Diagnosis not present

## 2023-10-16 DIAGNOSIS — K219 Gastro-esophageal reflux disease without esophagitis: Secondary | ICD-10-CM | POA: Diagnosis not present

## 2023-10-16 DIAGNOSIS — J9611 Chronic respiratory failure with hypoxia: Secondary | ICD-10-CM

## 2023-10-16 MED ORDER — MISC. DEVICES KIT: PACK | 0 refills | Status: DC

## 2023-10-16 MED ORDER — ALBUTEROL SULFATE HFA 108 (90 BASE) MCG/ACT IN AERS: 2.0000 | INHALATION_SPRAY | Freq: Four times a day (QID) | RESPIRATORY_TRACT | 3 refills | Status: DC | PRN

## 2023-10-16 MED ORDER — ALBUTEROL SULFATE (2.5 MG/3ML) 0.083% IN NEBU: 2.5000 mg | INHALATION_SOLUTION | Freq: Four times a day (QID) | RESPIRATORY_TRACT | 12 refills | Status: AC | PRN

## 2023-10-16 NOTE — Progress Notes (Signed)
 Jill Glenn    [id number]    Nov 01, 1965  Primary Care Physician:Jones, Debby LITTIE, MD Date of Appointment: 10/16/2023 Established Patient Visit  Chief complaint:   Chief Complaint  Patient presents with   COPD    Breathing has not improved      HPI: Jill Glenn is a 58 y.o. woman with COPD, very severe FEV1 24% of predicted. She did all 13 weeks of pulmonary rehab, finished in feb 2024. Did really well, had dramatic improvement. She is on [facility] with POC Since May 2025 Quit smoking October 2023.   Interval Updates: Here for COPD follow up.    On 2LNC POC with rest, 3 with exertion.  Going to pulmonary rehab but had to stop due to her father being in the hospital. Doing chair yoga. Interested in remote pulmonary rehab.   No interval exacerbations or hospitalizations for COPD.   Previous dysphagia has improved since she started eliminating spicy foods. ?GERD?   On trelegy 1 puff once daily, albuterol  about 4 times/week, especially when she's going outside in the heat.   Allergies doing   Ended up not going on vacation which was canceled for unrelated reasons.   I have reviewed the patient's family social and past medical history and updated as appropriate.   Past Medical History:  Diagnosis Date   Allergy     seasonal   Anxiety    Asthma    COPD (chronic obstructive pulmonary disease) (HCC)    Depression    Emphysema of lung (HCC)    PTSD (post-traumatic stress disorder)     Past Surgical History:  Procedure Laterality Date   ABDOMINAL HYSTERECTOMY     BREAST EXCISIONAL BIOPSY     moles     mx 2 removed   TONSILLECTOMY      Family History  Problem Relation Age of Onset   Migraines Mother    COPD Mother    Lung cancer Mother    Bipolar disorder Mother    Melanoma Father    Colon polyps Father    Alcohol abuse Father    Melanoma Paternal Aunt    Colon cancer Neg Hx    Crohn's disease Neg Hx    Esophageal cancer Neg Hx     Rectal cancer Neg Hx    Stomach cancer Neg Hx    Ulcerative colitis Neg Hx     Social History   Occupational History   Occupation: Audiological scientist  Tobacco Use   Smoking status: Former    Current packs/day: 0.00    Average packs/day: 1 pack/day for 40.6 years (40.6 ttl pk-yrs)    Types: Cigarettes    Start date: 03/05/1981    Quit date: 10/14/2021    Years since quitting: 2.0   Smokeless tobacco: Never  Vaping Use   Vaping status: Never Used  Substance and Sexual Activity   Alcohol use: Yes    Comment: rarely - approx. 1 beer/month   Drug use: No   Sexual activity: Yes    Partners: Male     Physical Exam: Blood pressure 94/76, pulse 71, temperature 98 F (36.7 C), temperature source Oral, height 5' 3 (1.6 m), weight 163 lb (73.9 kg), SpO2 98%.  Gen:   On nasal cannula with POC, well appearing, no respiratory distress Lungs:   diminished, no wheezes or crackles CV:      RRR no mrg   Data Reviewed: Imaging: I have personally reviewed the CT  Chest March 2025 which shows stable LUL nodule, extensive bilateral upper lobe predominant emphysema. Formal radiology interpretation pending.   ABG 05/23/23 shows pH 7.49, PCO2 32, PO2 82  PFTs:     Latest Ref Rng & Units 10/15/2021    8:50 AM  PFT Results  FVC-Pre L 1.58   FVC-Predicted Pre % 47   FVC-Post L 1.92   FVC-Predicted Post % 58   Pre FEV1/FVC % % 40   Post FEV1/FCV % % 41   FEV1-Pre L 0.64   FEV1-Predicted Pre % 24   FEV1-Post L 0.79   DLCO uncorrected ml/min/mmHg 11.71   DLCO UNC% % 58   DLCO corrected ml/min/mmHg 11.71   DLCO COR %Predicted % 58   DLVA Predicted % 67   TLC L 6.59   TLC % Predicted % 134   RV % Predicted % 253    I have personally reviewed the patient's PFTs and very severe COPD FEv1  Labs:  Immunization status: Immunization History  Administered Date(s) Administered   Hepb-cpg 11/16/2022   Influenza, Seasonal, Injecte, Preservative Fre 11/16/2022   Influenza,inj,Quad PF,6+ Mos  12/21/2020, 10/14/2021   PNEUMOCOCCAL CONJUGATE-20 12/21/2020   Tdap 03/22/2021   Zoster Recombinant(Shingrix ) 03/22/2021, 04/12/2022   test October 2024 at [facility] Patient walked 10 laps 0 partials 329 meters/1080 feet- 66% on Room Air   External Records Personally Reviewed: pulmonary, hospital, pcp  Assessment:  Very Severe COPD FEV 1 24% - transplant candidate evaluation at [facility] in process Seasonal allergic rhinitis, controlled History of tobacco use disorder - last smoked October 2023 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   Stay active as you are doing.   Continue pulmonary rehab.  I have referred to [facility] remote pulmonary rehab  Continue the acid reflux precautions. We will hold off on barium swallow stuyd.   Split night study was normal - no OSA.   Continue follow up with [facility] for lung transplantation evaluation.   Return to Care: Return in about 3 months (around 01/16/2024).   Verdon Gore, MD Pulmonary and Critical Care Medicine [facility] Office:[phone]

## 2023-10-16 NOTE — Patient Instructions (Addendum)
 It was a pleasure to see you today!  Please schedule follow up with Dr Adrien in 3 months.  If my schedule is not open yet, we will contact you with a reminder closer to that time. Please call [phone] if you haven't heard from us  a month before, and always call us  sooner if issues or concerns arise. You can also send us  a message through MyChart, but but aware that this is not to be used for urgent issues and it may take up to 5-7 days to receive a reply. Please be aware that you will likely be able to view your results before I have a chance to respond to them. Please give us  5 business days to respond to any non-urgent results.   continue Trelegy  continue xyzal  and singulair  for allergies.   Stay active as you are doing.   Continue pulmonary rehab.  I have referred to [facility] remote pulmonary rehab  Continue the acid reflux precautions. We will hold off on barium swallow stuyd.   Split night study was normal - no OSA.   Continue follow up with [facility] for lung transplantation evaluation.

## 2023-10-17 ENCOUNTER — Encounter (HOSPITAL_COMMUNITY)

## 2023-10-17 DIAGNOSIS — Z008 Encounter for other general examination: Secondary | ICD-10-CM | POA: Diagnosis not present

## 2023-10-17 DIAGNOSIS — Z7682 Awaiting organ transplant status: Secondary | ICD-10-CM | POA: Diagnosis not present

## 2023-10-17 DIAGNOSIS — F064 Anxiety disorder due to known physiological condition: Secondary | ICD-10-CM | POA: Diagnosis not present

## 2023-10-18 ENCOUNTER — Ambulatory Visit: Admitting: Internal Medicine

## 2023-10-18 DIAGNOSIS — Z7682 Awaiting organ transplant status: Secondary | ICD-10-CM | POA: Diagnosis not present

## 2023-10-18 DIAGNOSIS — J984 Other disorders of lung: Secondary | ICD-10-CM | POA: Diagnosis not present

## 2023-10-18 DIAGNOSIS — J439 Emphysema, unspecified: Secondary | ICD-10-CM | POA: Diagnosis not present

## 2023-10-18 DIAGNOSIS — R918 Other nonspecific abnormal finding of lung field: Secondary | ICD-10-CM | POA: Diagnosis not present

## 2023-10-19 ENCOUNTER — Encounter (HOSPITAL_COMMUNITY)

## 2023-10-20 ENCOUNTER — Ambulatory Visit (INDEPENDENT_AMBULATORY_CARE_PROVIDER_SITE_OTHER): Admitting: Licensed Clinical Social Worker

## 2023-10-20 DIAGNOSIS — F331 Major depressive disorder, recurrent, moderate: Secondary | ICD-10-CM

## 2023-10-20 DIAGNOSIS — F411 Generalized anxiety disorder: Secondary | ICD-10-CM | POA: Diagnosis not present
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
## 2023-10-24 ENCOUNTER — Encounter (HOSPITAL_COMMUNITY)

## 2023-11-02 DIAGNOSIS — J449 Chronic obstructive pulmonary disease, unspecified: Secondary | ICD-10-CM | POA: Diagnosis not present

## 2023-11-02 DIAGNOSIS — R0602 Shortness of breath: Secondary | ICD-10-CM | POA: Diagnosis not present

## 2023-11-02 DIAGNOSIS — J432 Centrilobular emphysema: Secondary | ICD-10-CM | POA: Diagnosis not present

## 2023-11-07 DIAGNOSIS — H35341 Macular cyst, hole, or pseudohole, right eye: Secondary | ICD-10-CM | POA: Diagnosis not present

## 2023-11-08 ENCOUNTER — Telehealth: Admitting: Physician Assistant

## 2023-11-08 DIAGNOSIS — R3989 Other symptoms and signs involving the genitourinary system: Secondary | ICD-10-CM | POA: Diagnosis not present

## 2023-11-08 MED ORDER — NITROFURANTOIN MONOHYD MACRO 100 MG PO CAPS: 100.0000 mg | ORAL_CAPSULE | Freq: Two times a day (BID) | ORAL | 0 refills | Status: DC

## 2023-11-08 NOTE — Progress Notes (Signed)

## 2023-11-13 ENCOUNTER — Ambulatory Visit: Admitting: Internal Medicine

## 2023-11-13 DIAGNOSIS — H2512 Age-related nuclear cataract, left eye: Secondary | ICD-10-CM | POA: Diagnosis not present

## 2023-11-13 DIAGNOSIS — H35341 Macular cyst, hole, or pseudohole, right eye: Secondary | ICD-10-CM | POA: Diagnosis not present

## 2023-11-13 DIAGNOSIS — H2511 Age-related nuclear cataract, right eye: Secondary | ICD-10-CM | POA: Diagnosis not present

## 2023-11-13 DIAGNOSIS — Z01818 Encounter for other preprocedural examination: Secondary | ICD-10-CM | POA: Diagnosis not present

## 2023-11-14 ENCOUNTER — Telehealth: Payer: Self-pay | Admitting: Internal Medicine

## 2023-11-14 NOTE — Telephone Encounter (Signed)
 Fax received from Dr. Carliss Player with [facility] to perform a cataract extraction under general anesthesia on patient.  Patient needs surgery clearance. Surgery is 11/22/23 and 12/05/23. Patient was seen on10/13/25 . Office protocol is a risk assessment can be sent to surgeon if patient has been seen in 60 days or less.   Sending to Dr Meade for risk assessment or recommendations if patient needs to be seen in office prior to surgical procedure.

## 2023-11-15 NOTE — Telephone Encounter (Signed)
 This is a preoperative pulmonary evaluation for Jill Glenn for cataract surgery under general anesthesia.  ASSESSMENT: Alanny Rivers Zaucha has an intermediate risk of post-operative pulmonary complications by ARISCAT Index.  The absolute assessment of risk/benefit of the procedure is deferred to the primary team's evaluation.  RECOMMENDATIONS:  In order to minimize the risk of complications and optimize pulmonary status, we recommend the following: - Encourage aggressive incentive spirometry hourly both peri-operatively and post-operatively as tolerated  - Early ambulation and physical therapy as tolerated post-operatively - Adequate pain control especially in the setting of abdominal and thoracic surgery - Bronchodilators as needed for wheezing or shortness of breath - Oral steroids only if the patient appears to have component of COPD exacerbation, otherwise no routine utilization needed - Ideally recommend smoking cessation for >4 weeks before any intervention - Intraoperatively keep OR time to the shortest as possible - Post operatively may benefit from Nocturnal Bipap  Preoperative Risk Calculation: Postoperative respiratory failure (PRF) is considered as failure to wean from mechanical ventilation within 48 hours of surgery or unplanned intubation/reintubation postoperatively. The validated risk calculator provides a risk estimate of PRF and is anticipated to aid in surgical decision-making and informed patient consent.  However risk can be accepted given the potential benefit of this intervention and it is not prohibitive.  The features of this patient's history that contribute to the pulmonary risk assessment include:  Age, COPD, General anesthesia  0 to 25 points: Low risk: 1.6% pulmonary complication rate  26 to 44 points: Intermediate risk: 13.3% pulmonary complication rate  45 to 123 points: High risk: 42.1% pulmonary complication rate   ARISCAT: Mazo et al.  Anesthesiology 2014; [id number]  Verdon Gore, MD Pulmonary and Critical Care Medicine [facility] 11/15/2023 4:40 PM Pager: see AMION  If no response to pager, please call critical care on call (see AMION) until 7pm After 7:00 pm call Elink

## 2023-11-16 ENCOUNTER — Ambulatory Visit (INDEPENDENT_AMBULATORY_CARE_PROVIDER_SITE_OTHER): Admitting: Internal Medicine

## 2023-11-16 VITALS — BP 118/86 | HR 60 | Temp 98.1°F | Resp 20 | Ht 63.0 in | Wt 164.0 lb

## 2023-11-16 DIAGNOSIS — J418 Mixed simple and mucopurulent chronic bronchitis: Secondary | ICD-10-CM

## 2023-11-16 DIAGNOSIS — R300 Dysuria: Secondary | ICD-10-CM

## 2023-11-16 DIAGNOSIS — J431 Panlobular emphysema: Secondary | ICD-10-CM

## 2023-11-16 LAB — URINALYSIS, ROUTINE W REFLEX MICROSCOPIC
Bilirubin Urine: NEGATIVE
Hgb urine dipstick: NEGATIVE
Ketones, ur: NEGATIVE
Nitrite: NEGATIVE
RBC / HPF: NONE SEEN (ref 0–?)
Specific Gravity, Urine: <=1.005 — AB (ref 1.000–1.030)
Total Protein, Urine: NEGATIVE
Urine Glucose: NEGATIVE
Urobilinogen, UA: 0.2 (ref 0.0–1.0)
pH: 6 (ref 5.0–8.0)

## 2023-11-16 NOTE — Progress Notes (Signed)
 Subjective:  Patient ID: Jill Glenn, female    DOB: 1965/12/02  Age: 58 y.o. MRN: [id number]  CC: Medical Management of Chronic Issues (6 month follow up )   HPI Jill Glenn presents for f/up ----  Discussed the use of AI scribe software for clinical note transcription with the patient, who gave verbal consent to proceed.  History of Present Illness [name] is a 58 year old female who presents with persistent urinary symptoms following a recent UTI.  She recently experienced a urinary tract infection and sought virtual care through [facility] due to difficulty securing an in-person appointment. She was prescribed antibiotics, which she completed, but continues to experience symptoms such as pressure and cloudy urine. She has a stinging sensation during urination and a mild ache in her back. No fever, chills, or significant flank pain.  She has a history of lung transplantation and her follow-up for this is going well, with the next appointment scheduled in six months. She is currently on prescribed medication for her lung condition and feels good overall. No coughing, wheezing, chest pain, or shortness of breath.  She is preparing for upcoming eye surgery due to a hole in the back of her eye. Her appetite is good, and she has not noticed any swelling in her legs or feet.     Outpatient Medications Prior to Visit  Medication Sig Dispense Refill   acetaminophen  (TYLENOL ) 325 MG tablet Take 2 tablets (650 mg total) by mouth every 6 (six) hours as needed for moderate pain, mild pain or headache.     albuterol  (PROVENTIL ) (2.5 MG/3ML) 0.083% nebulizer solution Take 3 mLs (2.5 mg total) by nebulization every 6 (six) hours as needed for wheezing or shortness of breath. 75 mL 12   albuterol  (VENTOLIN  HFA) 108 (90 Base) MCG/ACT inhaler Inhale 2 puffs into the lungs every 6 (six) hours as needed for wheezing or shortness of breath. 18 g 3   busPIRone  (BUSPAR ) 15 MG  tablet Take 1 tablet (15 mg total) by mouth 2 (two) times daily. 180 tablet 1   citalopram  (CELEXA ) 20 MG tablet Take 1 tablet (20 mg total) by mouth daily. 90 tablet 1   hydrOXYzine  (ATARAX ) 25 MG tablet Take 1 tablet (25 mg total) by mouth at bedtime as needed (sleep). 90 tablet 0   levocetirizine (XYZAL ) 5 MG tablet TAKE 1 TABLET BY MOUTH ONCE DAILY IN THE EVENING 30 tablet 10   montelukast  (SINGULAIR ) 10 MG tablet TAKE 1 TABLET BY MOUTH AT BEDTIME 30 tablet 10   nitrofurantoin, macrocrystal-monohydrate, (MACROBID) 100 MG capsule Take 1 capsule (100 mg total) by mouth 2 (two) times daily. 10 capsule 0   rosuvastatin  (CRESTOR ) 10 MG tablet Take 1 tablet (10 mg total) by mouth daily. 90 tablet 1   traZODone  (DESYREL ) 50 MG tablet Take 0.5-1 tablets (25-50 mg total) by mouth at bedtime as needed for sleep. 90 tablet 1   TRELEGY ELLIPTA  200-62.5-25 MCG/ACT AEPB INHALE 1 PUFF ONCE DAILY 60 each 10   Ubrogepant  (UBRELVY ) 100 MG TABS Take one tablet onset of Migraine May take another tablet 2 hours later, Don't exceed two tablets daily 16 tablet 11   Multiple Vitamin (MULTIVITAMIN ADULT) TABS Take 1 tablet by mouth daily with breakfast. (Patient not taking: Reported on 10/16/2023)     No facility-administered medications prior to visit.    ROS Review of Systems  Constitutional: Negative.  Negative for appetite change, chills, diaphoresis, fatigue and fever.  HENT:  Negative.    Eyes: Negative.   Respiratory:  Positive for shortness of breath. Negative for cough, chest tightness and wheezing.   Cardiovascular:  Negative for chest pain, palpitations and leg swelling.  Gastrointestinal:  Negative for abdominal pain, constipation, diarrhea, nausea and vomiting.  Endocrine: Negative.   Genitourinary:  Positive for dysuria. Negative for decreased urine volume, difficulty urinating, flank pain, hematuria and urgency.  Musculoskeletal:  Positive for back pain.  Neurological: Negative.  Negative for  dizziness and weakness.  Hematological:  Negative for adenopathy. Does not bruise/bleed easily.  Psychiatric/Behavioral: Negative.      Objective:  BP 118/86 (BP Location: Right Arm, Patient Position: Sitting, Cuff Size: Normal)   Pulse 60   Temp 98.1 F (36.7 C) (Oral)   Resp 20   Ht 5' 3 (1.6 m)   Wt 164 lb (74.4 kg)   SpO2 97%   BMI 29.05 kg/m   BP Readings from Last 3 Encounters:  11/16/23 118/86  10/16/23 94/76  07/18/23 (!) 98/58    Wt Readings from Last 3 Encounters:  11/16/23 164 lb (74.4 kg)  10/16/23 163 lb (73.9 kg)  08/29/23 166 lb 14.2 oz (75.7 kg)    Physical Exam Vitals reviewed.  Constitutional:      General: She is not in acute distress.    Appearance: She is ill-appearing. She is not toxic-appearing or diaphoretic.  HENT:     Nose: Nose normal.     Mouth/Throat:     Mouth: Mucous membranes are moist.  Eyes:     General: No scleral icterus.    Conjunctiva/sclera: Conjunctivae normal.  Cardiovascular:     Rate and Rhythm: Normal rate and regular rhythm.     Heart sounds: No murmur heard.    No friction rub. No gallop.  Pulmonary:     Breath sounds: No stridor. Examination of the right-lower field reveals rales. Examination of the left-lower field reveals rales. Rales present. No decreased breath sounds, wheezing or rhonchi.  Abdominal:     General: Abdomen is flat.     Palpations: There is no mass.     Tenderness: There is no abdominal tenderness. There is no guarding.     Hernia: No hernia is present.  Musculoskeletal:        General: Normal range of motion.     Cervical back: Neck supple.     Right lower leg: No edema.     Left lower leg: No edema.  Lymphadenopathy:     Cervical: No cervical adenopathy.  Skin:    General: Skin is warm and dry.  Neurological:     General: No focal deficit present.     Mental Status: She is alert. Mental status is at baseline.  Psychiatric:        Mood and Affect: Mood normal.        Behavior:  Behavior normal.     Lab Results  Component Value Date   WBC 9.3 05/16/2023   HGB 13.5 05/16/2023   HCT 41.1 05/16/2023   PLT 379.0 05/16/2023   GLUCOSE 94 05/16/2023   CHOL 222 (H) 05/16/2023   TRIG 130.0 05/16/2023   HDL 57.60 05/16/2023   LDLCALC 139 (H) 05/16/2023   ALT 15 05/16/2023   AST 16 05/16/2023   NA 138 05/16/2023   K 3.8 05/16/2023   CL 101 05/16/2023   CREATININE 0.84 05/16/2023   BUN 17 05/16/2023   CO2 28 05/16/2023   TSH 1.06 05/16/2023   INR 1.0 03/23/2022  HGBA1C 5.9 11/16/2022    No results found.  Assessment & Plan:   Dysuria- There are WBC's in the urine but the culture was negative. -     Urinalysis, Routine w reflex microscopic; Future -     CULTURE, URINE COMPREHENSIVE; Future  Mixed simple and mucopurulent chronic bronchitis (HCC) -     COVID-19 mRNA Vac-TriS(Pfizer); Inject 0.3 mLs into the muscle once for 1 dose.  Dispense: 0.3 mL; Refill: 0  Panlobular emphysema (HCC) -     COVID-19 mRNA Vac-TriS(Pfizer); Inject 0.3 mLs into the muscle once for 1 dose.  Dispense: 0.3 mL; Refill: 0     Follow-up: Return in about 3 months (around 02/16/2024).  Debby Molt, MD

## 2023-11-16 NOTE — Patient Instructions (Signed)

## 2023-11-17 ENCOUNTER — Telehealth: Payer: Self-pay | Admitting: *Deleted

## 2023-11-17 ENCOUNTER — Ambulatory Visit (HOSPITAL_COMMUNITY): Admitting: Licensed Clinical Social Worker

## 2023-11-17 DIAGNOSIS — F331 Major depressive disorder, recurrent, moderate: Secondary | ICD-10-CM | POA: Diagnosis not present

## 2023-11-17 DIAGNOSIS — F411 Generalized anxiety disorder: Secondary | ICD-10-CM

## 2023-11-17 NOTE — Telephone Encounter (Signed)
 Copied from CRM [id number]. Topic: General - Other >> Nov 14, 2023  8:35 AM Corean SAUNDERS wrote: Reason for CRM: Nurse Practitioner with [facility] states she is re faxing a surgical clearance request as she sent it yesterday but wants to make sure  Pulmonary has it as the patients surgery is scheduled for 11/19

## 2023-11-17 NOTE — Progress Notes (Signed)
 THERAPIST PROGRESS NOTE   Session Date: 11/17/2023  Session Time: 1010 - 1102  Participation Level: Active  Behavioral Response: CasualAlertEuthymic  Type of Therapy: Individual Therapy  Treatment Goals addressed:   Progressing (4) LTG: Reduce frequency, intensity, and duration of depression symptoms so that daily functioning is improved (OP Depression) LTG: Increase coping skills to manage depression and improve ability to perform daily activities (OP Depression) LTG: I just want to be happy (OP Depression) LTG: Work on communicating my needs better (Anxiety) STG: Myria will identify cognitive patterns and beliefs that support depression and anxiety (OP Depression)  Progress Towards Goals: Progressing  Interventions: CBT, Motivational Interviewing, and Supportive  Summary: Jill Glenn is a 58 y.o. female with past psych history of MDD, GAD, and PTSD, presenting for follow-up therapy session in efforts to improve management of depressive and anxious symptoms.  Patient actively engaged in session, presenting in pleasant moods, and congruent affect throughout duration of visit. Patient openly engaged in introductory check-in, sharing of Things have been good overall, sharing of things having been a little stressful lately, learning of having to have 2 surgeries, one for cataracts removal and another for repairing a hole in the back of pt's eye. Acknowledged finding self experiencing increased anxiousness surrounding challenges, dealing with such by making light of the situation and praying. Shared of added stress surrounding medical insurance ending at the end of the year and needing to secure alternate coverage.   Engaged in reassessing presenting depressive and anxious sxs via PHQ-9 and GAD-7, exploring mild variances in ratings, processing relation to presenting stressors. Processed ways pt has been dealing with stressors, noting of inability to continue with [facility] [employer] due to vision  issues, continuing Chair Yoga, and having been approved to resume Pulmonary Rehab next month after eye surgery.  Pt shared of having good news, detailing of husband is getting new job, going to [employer], considering making him a partner in business, looking at a good salary increase in comparison to current role, proving to reassure financial stress.   Pt noted of continuing to engage in completing daily to-do lists and plans to resume walking again with temperatures warming in coming days to aid in improving moods.  Engaged in exploration of 'Sphere of Control', processing things that are within ones control, within ones sphere of influence, within ones sphere of concern, and things that are outside of control, processing ways in which technique can be applied in a general sense towards daily life as well as specific factors and/or stressors.     11/17/2023   10:26 AM 11/16/2023    9:44 AM 10/20/2023   10:18 AM 10/05/2023   10:57 AM 08/24/2023    3:44 PM  Depression screen PHQ 2/9  Decreased Interest 0 0 0 0 1  Down, Depressed, Hopeless 1 0 0 0 0  PHQ - 2 Score 1 0 0 0 1  Altered sleeping 2 2 1 1 3   Tired, decreased energy 2 1 0 0 0  Change in appetite 1 0 0 0 0  Feeling bad or failure about yourself  0 0 0 0 0  Trouble concentrating 0 0 0 0 1  Moving slowly or fidgety/restless 0 0 0 0 0  Suicidal thoughts 0 0 0 0 0  PHQ-9 Score 6 3 1  1  5    Difficult doing work/chores Not difficult at all Not difficult at all Not difficult at all Not difficult at all Not difficult at all     Data  saved with a previous flowsheet row definition      11/17/2023   10:24 AM 11/16/2023    9:44 AM 10/20/2023   10:21 AM 10/05/2023   10:54 AM  GAD 7 : Generalized Anxiety Score  Nervous, Anxious, on Edge 2 0 1 0  Control/stop worrying 2 2 1 1   Worry too much - different things 1 2 0 0  Trouble relaxing 1 1 0 0  Restless 0 0 0 0  Easily annoyed or irritable 0 0 0 1  Afraid - awful might happen 1  0 0 0  Total GAD 7 Score 7 5 2 2   Anxiety Difficulty Somewhat difficult Not difficult at all Not difficult at all Not difficult at all     Suicidal/Homicidal: Nowithout intent/plan  Therapist Response:  Clinician actively greeted patient upon presenting for today's visit, assessing presenting moods and affect, engaging in brief review of presenting moods.  Actively engaged patient in check-in, utilizing open ended questions in eliciting recounts of events of the past month, exploring newly experienced and/or observed stressors, recurrent challenges, observed implications on moods, and individual management of sxs.  Utilized active listening techniques in supporting pt's recounts of events, processing thoughts, feelings, and perspectives surrounding new and ongoing challenges, and individual efforts at managing stress, and progressions. Engaged in reassessing of presenting depressive and anxious sxs via PHQ-9 and GAD-7, exploring variances in sxs in relation to presenting stressors. Clinician utilized CBT, psycho-ed, and solution focused interventions to support pt in navigating identified challenges.  [x]  Cognitive Challenging []  Cognitive Refocusing [x]  Cognitive Reframing  []  Communication Skills []  Compliance Issues []  DBT [x]  Exploration of Coping Patterns [x]  Exploration of Emotions []  Exploration of Relationship Patterns []  Guided Imagery []  Interactive Feedback []  Interpersonal Resolutions []  Mindfulness Training []  Preventative Services [x]  Psycho-Education  []  Relaxation/Deep Breathing []  Review of Treatment Plan/Progress []  Role-Play/Behavioral Rehearsal  [x]  Structured Problem Solving [x]  Supportive Reflection []  Symptom Management  []  Other  Patient responded well to interventions. Patient continues to meet criteria for MDD, GAD, and PTSD. Patient will continue to benefit from engagement in outpatient therapy due to being the least restrictive service to meet presenting needs. Pt  proves to maintain moderate progress towards identified goals.  Plan: Return again in 2 week.  Diagnosis:  Encounter Diagnoses  Name Primary?   Generalized anxiety disorder Yes   MDD (major depressive disorder), recurrent episode, moderate (HCC)     Collaboration of Care: Psychiatrist AEB provider notes available in EHR.  Patient/Guardian was advised Release of Information must be obtained prior to any record release in order to collaborate their care with an outside provider. Patient/Guardian was advised if they have not already done so to contact the registration department to sign all necessary forms in order for us  to release information regarding their care.   Consent: Patient/Guardian gives verbal consent for treatment and assignment of benefits for services provided during this visit. Patient/Guardian expressed understanding and agreed to proceed.   Virtual Visit via Video Note  I connected with Abbegayle L Swager on 11/17/23 at 10:00 AM EST by a video enabled telemedicine application and verified that I am speaking with the correct person using two identifiers.  Location: Patient: Home Provider: Home Office   I discussed the limitations of evaluation and management by telemedicine and the availability of in person appointments. The patient expressed understanding and agreed to proceed.  I discussed the assessment and treatment plan with the patient. The patient was provided an opportunity to  ask questions and all were answered. The patient agreed with the plan and demonstrated an understanding of the instructions.   The patient was advised to call back or seek an in-person evaluation if the symptoms worsen or if the condition fails to improve as anticipated.  I provided 52 minutes of non-face-to-face time during this encounter.   [name], MSW, LCSW 11/17/2023,  10:28 AM

## 2023-11-17 NOTE — Telephone Encounter (Signed)
 Done- see 11/14/23 TE

## 2023-11-17 NOTE — Telephone Encounter (Signed)
 Copy of this encounter faxed to [facility]

## 2023-11-18 LAB — CULTURE, URINE COMPREHENSIVE: RESULT:: NO GROWTH

## 2023-11-20 ENCOUNTER — Ambulatory Visit: Payer: Self-pay | Admitting: Internal Medicine

## 2023-11-20 MED ORDER — COVID-19 MRNA VAC-TRIS(PFIZER) 30 MCG/0.3ML IM SUSY: 0.3000 mL | PREFILLED_SYRINGE | Freq: Once | INTRAMUSCULAR | 0 refills | Status: AC

## 2023-11-21 ENCOUNTER — Other Ambulatory Visit: Payer: Self-pay | Admitting: Internal Medicine

## 2023-11-21 DIAGNOSIS — E785 Hyperlipidemia, unspecified: Secondary | ICD-10-CM

## 2023-11-21 DIAGNOSIS — I2510 Atherosclerotic heart disease of native coronary artery without angina pectoris: Secondary | ICD-10-CM

## 2023-11-22 ENCOUNTER — Ambulatory Visit: Admitting: Nurse Practitioner

## 2023-11-22 DIAGNOSIS — H25811 Combined forms of age-related cataract, right eye: Secondary | ICD-10-CM | POA: Diagnosis not present

## 2023-11-22 DIAGNOSIS — Z955 Presence of coronary angioplasty implant and graft: Secondary | ICD-10-CM | POA: Diagnosis not present

## 2023-11-22 DIAGNOSIS — H2511 Age-related nuclear cataract, right eye: Secondary | ICD-10-CM | POA: Diagnosis not present

## 2023-11-27 NOTE — Telephone Encounter (Signed)
 Please advise

## 2023-12-03 DIAGNOSIS — R0602 Shortness of breath: Secondary | ICD-10-CM | POA: Diagnosis not present

## 2023-12-03 DIAGNOSIS — J449 Chronic obstructive pulmonary disease, unspecified: Secondary | ICD-10-CM | POA: Diagnosis not present

## 2023-12-03 DIAGNOSIS — J432 Centrilobular emphysema: Secondary | ICD-10-CM | POA: Diagnosis not present

## 2023-12-05 DIAGNOSIS — I10 Essential (primary) hypertension: Secondary | ICD-10-CM | POA: Diagnosis not present

## 2023-12-05 DIAGNOSIS — H35341 Macular cyst, hole, or pseudohole, right eye: Secondary | ICD-10-CM | POA: Diagnosis not present

## 2023-12-07 ENCOUNTER — Ambulatory Visit (HOSPITAL_COMMUNITY): Admitting: Licensed Clinical Social Worker

## 2023-12-07 ENCOUNTER — Encounter (HOSPITAL_COMMUNITY): Payer: Self-pay

## 2023-12-07 NOTE — Progress Notes (Signed)
 THERAPIST PROGRESS NOTE   Session Date: 12/07/2023  Session Time: 8593-8588  Virtual Visit via Video Note  I connected with Jill Glenn on 12/07/23 at  1:00 PM EST by a video enabled telemedicine application and verified that I am speaking with the correct person using two identifiers.  Location: Patient: Home Provider: [facility] [place] Office   I discussed the limitations of evaluation and management by telemedicine and the availability of in person appointments. The patient expressed understanding and agreed to proceed.   I discussed the assessment and treatment plan with the patient. The patient was provided an opportunity to ask questions and all were answered. The patient agreed with the plan and demonstrated an understanding of the instructions.   The patient was advised to call back or seek an in-person evaluation if the symptoms worsen or if the condition fails to improve as anticipated.  I provided 5 minutes of non-face-to-face time during this encounter.   Clinician connected with pt for today's scheduled visit, observing pt to be physically fatigued and recovering from eye surgery on 12/2. Pt expressed surgery to have been significantly more invasive than previously believed. Clinician explored pt's interest in rescheduling today's visit to allow pt to focus further on recover, which pt proved agreeable to, sharing of having not proven to sleep over recent days due to being required to remain seated upright, causing significant discomfort.  Pt's next scheduled appt is 12/19, confirming availability. Clinician offered the opportunity to reschedule sooner if appointment time opens.  Jill Glenn, MSW, LCSW 12/07/2023,  1:31 PM

## 2023-12-08 ENCOUNTER — Telehealth (INDEPENDENT_AMBULATORY_CARE_PROVIDER_SITE_OTHER): Admitting: Student

## 2023-12-08 DIAGNOSIS — F4310 Post-traumatic stress disorder, unspecified: Secondary | ICD-10-CM | POA: Diagnosis not present

## 2023-12-08 DIAGNOSIS — F329 Major depressive disorder, single episode, unspecified: Secondary | ICD-10-CM | POA: Diagnosis not present

## 2023-12-08 DIAGNOSIS — F411 Generalized anxiety disorder: Secondary | ICD-10-CM

## 2023-12-08 DIAGNOSIS — G4709 Other insomnia: Secondary | ICD-10-CM | POA: Diagnosis not present
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
## 2023-12-13 DIAGNOSIS — H35341 Macular cyst, hole, or pseudohole, right eye: Secondary | ICD-10-CM | POA: Diagnosis not present

## 2023-12-22 ENCOUNTER — Ambulatory Visit (INDEPENDENT_AMBULATORY_CARE_PROVIDER_SITE_OTHER): Payer: Self-pay | Admitting: Licensed Clinical Social Worker

## 2023-12-22 ENCOUNTER — Encounter (HOSPITAL_COMMUNITY): Payer: Self-pay

## 2023-12-22 DIAGNOSIS — Z91199 Patient's noncompliance with other medical treatment and regimen due to unspecified reason: Secondary | ICD-10-CM

## 2023-12-22 NOTE — Progress Notes (Signed)
 THERAPIST PROGRESS NOTE   Session Date: 12/22/2023  Session Time: 1000  Jill Glenn    Clinician attempted to connect with patient for scheduled appointment via Caregility video, sending text request x4 with no response.     Attempt 1: Text: 1007   Attempt 2: Text: 1009    Attempt 3: Text: 1010  Attempt 4: Text: 1012   Disconnected video visit at:  1013     Per [facility] policy, after multiple attempts to reach patient unsuccessfully at appointed time, visit will be coded as a no show.  Jill Glenn, MSW, LCSW 12/22/2023,  10:13 AM

## 2023-12-25 ENCOUNTER — Ambulatory Visit: Admitting: Internal Medicine

## 2023-12-25 ENCOUNTER — Ambulatory Visit: Payer: Self-pay | Admitting: Internal Medicine

## 2023-12-25 ENCOUNTER — Encounter: Payer: Self-pay | Admitting: Internal Medicine

## 2023-12-25 ENCOUNTER — Ambulatory Visit: Payer: 59 | Admitting: Adult Health

## 2023-12-25 VITALS — BP 119/72 | HR 83 | Temp 98.5°F | Ht 63.0 in | Wt 164.0 lb

## 2023-12-25 DIAGNOSIS — J441 Chronic obstructive pulmonary disease with (acute) exacerbation: Secondary | ICD-10-CM | POA: Diagnosis not present

## 2023-12-25 DIAGNOSIS — K219 Gastro-esophageal reflux disease without esophagitis: Secondary | ICD-10-CM

## 2023-12-25 DIAGNOSIS — Z23 Encounter for immunization: Secondary | ICD-10-CM

## 2023-12-25 DIAGNOSIS — J9611 Chronic respiratory failure with hypoxia: Secondary | ICD-10-CM | POA: Diagnosis not present

## 2023-12-25 DIAGNOSIS — J301 Allergic rhinitis due to pollen: Secondary | ICD-10-CM | POA: Diagnosis not present

## 2023-12-25 DIAGNOSIS — J069 Acute upper respiratory infection, unspecified: Secondary | ICD-10-CM

## 2023-12-25 DIAGNOSIS — Z87891 Personal history of nicotine dependence: Secondary | ICD-10-CM

## 2023-12-25 DIAGNOSIS — G4710 Hypersomnia, unspecified: Secondary | ICD-10-CM

## 2023-12-25 LAB — POCT INFLUENZA A/B
Influenza A, POC: NEGATIVE
Influenza B, POC: NEGATIVE

## 2023-12-25 MED ORDER — PREDNISONE 10 MG PO TABS: ORAL_TABLET | ORAL | 0 refills | Status: AC

## 2023-12-25 MED ORDER — METHYLPREDNISOLONE ACETATE 80 MG/ML IJ SUSP
80.0000 mg | Freq: Once | INTRAMUSCULAR | Status: AC
  Administered 2023-12-25: 80 mg via INTRAMUSCULAR

## 2023-12-25 MED ORDER — AZITHROMYCIN 250 MG PO TABS: 250.0000 mg | ORAL_TABLET | Freq: Every day | ORAL | 0 refills | Status: AC

## 2023-12-25 NOTE — Addendum Note (Signed)
 Addended by: MOODY RANCHER on: 12/25/2023 10:46 AM   Modules accepted: Orders

## 2023-12-25 NOTE — Patient Instructions (Addendum)
 It was a pleasure to see you today!  Keep your appointment with Dr. Adrien in January. Please call sooner [phone] if issues or concerns arise. You can also send us  a message through MyChart, but but aware that this is not to be used for urgent issues and it may take up to 5-7 days to receive a reply. Please be aware that you will likely be able to view your results before I have a chance to respond to them. Please give us  5 business days to respond to any non-urgent results.    Sorry to hear you aren't feeling well!  We did a rapid flu test today which was negative.  Antibiotic sent to pharmacy.   Steroid injection in the office followed by prednisone  taper, sent to pharmacy  Continue Trelegy. Continue albuterol  nebs and/or inhaler up to 6 times/day as needed.  Go to the hospital for increasing oxygen  requirements and unable to stay over 90% with your POC, unable to catch your breath, albuterol  no longer helping.   Continue xyzal  and singulair  for allergies.   Keep up acid reflux medication.   Keep your appointment with Dr. Adrien in January.   Hope you feel better soon!

## 2023-12-25 NOTE — Telephone Encounter (Signed)
 Pt had ov today with Dr. Desai

## 2023-12-25 NOTE — Telephone Encounter (Signed)
" °  FYI Only or Action Required?: FYI only for provider: appointment scheduled on 12.22.25.  Patient is followed in Pulmonology for COPD, last seen on 10/16/2023 by Meade Verdon RAMAN, MD.  Called Nurse Triage reporting Cough.  Symptoms began a week ago.  Interventions attempted: Rescue inhaler, Nebulizer treatments, and Home oxygen  use.  Symptoms are: gradually worsening.  Triage Disposition: See HCP Within 4 Hours (Or PCP Triage)  Patient/caregiver understands and will follow disposition?: Yes  Copied from CRM [id number]. Topic: Clinical - Red Word Triage >> Dec 25, 2023  8:06 AM Benton O wrote: [facility] that prompted transfer to Nurse Triage: having hard time with breathing coughing up a lot of mucus and my chest sore Reason for Disposition  Wheezing is present  Answer Assessment - Initial Assessment Questions 1. ONSET: When did the cough begin?       X over one week  2. SEVERITY: How bad is the cough today?       Worsening   3. SPUTUM: Describe the color of your sputum (e.g., none, dry cough; clear, white, yellow, green)      Green/yellow  4. HEMOPTYSIS: Are you coughing up any blood? If Yes, ask: How much? (e.g., flecks, streaks, tablespoons, etc.)      Denies  5. DIFFICULTY BREATHING: Are you having difficulty breathing? If Yes, ask: How bad is it? (e.g., mild, moderate, severe)       C/o SOB  6. FEVER: Do you have a fever? If Yes, ask: What is your temperature, how was it measured, and when did it start?      Denies  7. CARDIAC HISTORY: Do you have any history of heart disease? (e.g., heart attack, congestive heart failure)      Pt does not have cardiac hx  8. LUNG HISTORY: Do you have any history of lung disease?  (e.g., pulmonary embolus, asthma, emphysema)     COPD  9. PE RISK FACTORS: Do you have a history of blood clots? (or: recent major surgery, recent prolonged travel, bedridden)     Pt does not have PE risk factors  10. OTHER  SYMPTOMS:  Pt denies wheezing  Pt reports chest pain across the front when resting and runny nose           Pt reports Cough Pt is taking inhaler and nebulizer and 2L of oxygen  when sitting Pt scheduled for a visit on 12.22.25  for further evaluation with pt's pulmonologist. Pt agrees with plan of care, will call back for any worsening symptoms  Protocols used: Cough - Acute Productive-A-AH  "

## 2023-12-25 NOTE — Progress Notes (Signed)
 "              Jill Glenn    [id number]    [birth year]  Primary Care Physician:Jones, Debby LITTIE, MD Date of Appointment: 12/25/2023 Established Patient Visit  Chief complaint:   Chief Complaint  Patient presents with   COPD    Fatigued and chest soreness.    Cough    Coughing up lots of phlegm/mucus.Last Wednesday      HPI: Jill Glenn is a 58 y.o. woman with COPD, very severe FEV1 24% of predicted. She did all 13 weeks of pulmonary rehab, finished in feb 2024. Did really well, had dramatic improvement. She is on 2-3LNC with POC Since May 2025 Quit smoking October 2023.   Interval Updates: Here for acute visit for COPD exacerbation.   5 days of shortness of breath, cough, wheezing. Her grandson had Flu A in the last week. Her granddaughter and grandson had strep throat. She has not taken any home tests.   No fevers Decreased appetite.   Baseline oxygen  2LNC with POC. She is still on this but occasionally bumps up to 3.   Current regimen Trelegy 1 puff once daily Albuterol  use is 2-4 times/day and nebulizer machine at least daily. This is up from her baseline.  She is jittery and dyspneic.  I have reviewed the patient's family social and past medical history and updated as appropriate.   Past Medical History:  Diagnosis Date   Allergy     seasonal   Anxiety    Asthma    COPD (chronic obstructive pulmonary disease) (HCC)    Depression    Emphysema of lung (HCC)    PTSD (post-traumatic stress disorder)     Past Surgical History:  Procedure Laterality Date   ABDOMINAL HYSTERECTOMY     BREAST EXCISIONAL BIOPSY     moles     mx 2 removed   TONSILLECTOMY      Family History  Problem Relation Age of Onset   Migraines Mother    COPD Mother    Lung cancer Mother    Bipolar disorder Mother    Melanoma Father    Colon polyps Father    Alcohol abuse Father    Melanoma Paternal Aunt    Colon cancer Neg Hx    Crohn's disease Neg Hx    Esophageal  cancer Neg Hx    Rectal cancer Neg Hx    Stomach cancer Neg Hx    Ulcerative colitis Neg Hx     Social History   Occupational History   Occupation: audiological scientist  Tobacco Use   Smoking status: Former    Current packs/day: 0.00    Average packs/day: 1 pack/day for 40.6 years (40.6 ttl pk-yrs)    Types: Cigarettes    Start date: 03/05/1981    Quit date: 10/14/2021    Years since quitting: 2.1   Smokeless tobacco: Never  Vaping Use   Vaping status: Never Used  Substance and Sexual Activity   Alcohol use: Yes    Comment: rarely - approx. 1 beer/month   Drug use: No   Sexual activity: Yes    Partners: Male     Physical Exam: Blood pressure 119/72, pulse 83, temperature 98.5 F (36.9 C), temperature source Oral, height 5' 3 (1.6 m), weight 164 lb (74.4 kg), SpO2 98%.  Gen:   On nasal cannula poc, appears acutely ill, short of breath Lungs:   tachypnic, decreased air entry bilaterally, no wheeze CV:  RRR no mrg Able to speak in full sentences   Data Reviewed: Imaging: I have personally reviewed the CT Chest March 2025 which shows stable LUL nodule, extensive bilateral upper lobe predominant emphysema. Formal radiology interpretation pending.   ABG 05/23/23 shows pH 7.49, PCO2 32, PO2 82  PFTs:     Latest Ref Rng & Units 10/15/2021    8:50 AM  PFT Results  FVC-Pre L 1.58   FVC-Predicted Pre % 47   FVC-Post L 1.92   FVC-Predicted Post % 58   Pre FEV1/FVC % % 40   Post FEV1/FCV % % 41   FEV1-Pre L 0.64   FEV1-Predicted Pre % 24   FEV1-Post L 0.79   DLCO uncorrected ml/min/mmHg 11.71   DLCO UNC% % 58   DLCO corrected ml/min/mmHg 11.71   DLCO COR %Predicted % 58   DLVA Predicted % 67   TLC L 6.59   TLC % Predicted % 134   RV % Predicted % 253    I have personally reviewed the patient's PFTs and very severe COPD FEv1  Labs:  Lab Results  Component Value Date   NA 138 05/16/2023   K 3.8 05/16/2023   CO2 28 05/16/2023   GLUCOSE 94 05/16/2023   BUN 17  05/16/2023   CREATININE 0.84 05/16/2023   CALCIUM  9.7 05/16/2023   GFR 77.26 05/16/2023   GFRNONAA >60 11/08/2022   Lab Results  Component Value Date   WBC 9.3 05/16/2023   HGB 13.5 05/16/2023   HCT 41.1 05/16/2023   MCV 86.4 05/16/2023   PLT 379.0 05/16/2023    Immunization status: Immunization History  Administered Date(s) Administered   Hepb-cpg 11/16/2022   INFLUENZA, HIGH DOSE SEASONAL PF 10/18/2023   Influenza, Seasonal, Injecte, Preservative Fre 11/16/2022   Influenza,inj,Quad PF,6+ Mos 12/21/2020, 10/14/2021   PNEUMOCOCCAL CONJUGATE-20 12/21/2020   Tdap 03/22/2021   Zoster Recombinant(Shingrix ) 03/22/2021, 04/12/2022   test October 2024 at [facility] Patient walked 10 laps 0 partials 329 meters/1080 feet- 66% on Room Air   External Records Personally Reviewed: pulmonary, hospital, pcp  Assessment:  Very Severe COPD FEV 1 24% - transplant candidate evaluation at [facility] in process Seasonal allergic rhinitis, controlled History of tobacco use disorder - last smoked October 2023 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  We did a rapid flu test today which was negative.  Antibiotic sent to pharmacy.   Steroid injection in the office followed by prednisone  taper, sent to pharmacy  Continue Trelegy. Continue albuterol  nebs and/or inhaler up to 6 times/day as needed.  Go to the hospital for increasing oxygen  requirements and unable to stay over 90% with your POC, unable to catch your breath, albuterol  no longer helping.   Continue xyzal  and singulair  for allergies.   Keep up acid reflux medication.   Hope you feel better soon!   Return to Care: Keep your appointment with Dr. Adrien in January.    Jill Gore, MD Pulmonary and Critical Care Medicine [facility] Office:[phone]      "

## 2023-12-25 NOTE — Addendum Note (Signed)
 Addended by: MOODY RANCHER on: 12/25/2023 10:53 AM   Modules accepted: Orders

## 2024-01-05 ENCOUNTER — Ambulatory Visit (INDEPENDENT_AMBULATORY_CARE_PROVIDER_SITE_OTHER): Admitting: Licensed Clinical Social Worker

## 2024-01-05 DIAGNOSIS — F411 Generalized anxiety disorder: Secondary | ICD-10-CM

## 2024-01-05 DIAGNOSIS — F331 Major depressive disorder, recurrent, moderate: Secondary | ICD-10-CM

## 2024-01-05 NOTE — Progress Notes (Signed)
 THERAPIST PROGRESS NOTE   Session Date: 01/05/2024  Session Time: 1013 - 1055  Participation Level: Active  Behavioral Response: CasualAlertEuthymic  Type of Therapy: Individual Therapy  Treatment Goals addressed:  Outpatient (Current Episode) Show Details  Report  Completed/Met: 1 of 5  Progressing (4) LTG: Increase coping skills to manage depression and improve ability to perform daily activities (OP Depression) LTG: I just want to be happy (OP Depression) LTG: Work on communicating my needs better (Anxiety) STG: Arletha will identify cognitive patterns and beliefs that support depression and anxiety (OP Depression)  Completed/Met (1) LTG: Reduce frequency, intensity, and duration of depression symptoms so that daily functioning is improved (OP Depression) Progress Towards Goals: Progressing  Interventions: CBT, Motivational Interviewing, and Supportive  Summary: Jill Glenn is a 59 y.o. female with past psych history of MDD, GAD, and PTSD, presenting for follow-up therapy session in efforts to improve management of depressive and anxious symptoms.  Patient actively engaged in session, presenting in pleasant moods, and congruent affect throughout duration of visit. Patient openly engaged in introductory check-in, sharing of Doing good, further sharing of December having been a rough month due to having had eye surgeries and and experiencing acute exacerbation related to COPD, sharing of feeling better today, having taken the past two weeks to recover physically, planning on resuming pulmonary rehab next week.  Reassessed presenting depressive and anxious sxs via PHQ-9 and GAD-7, noting of mild reduction in sxs, processing observed maintained stability in emotional health. Pt shared of individual efforts towards maintained emotional health by engaging in tasks she hasn't been able to do, having resumed painting, and making lists of tasks that she wants to do, and also noted of  continuing to take medication as prescribed. Actively engaged in annual update/revision to tx plan, extensively processing progressions made throughout course of tx thus far, and exploring continued areas for work.     01/05/2024   10:23 AM 11/17/2023   10:26 AM 11/16/2023    9:44 AM 10/20/2023   10:18 AM 10/05/2023   10:57 AM  Depression screen PHQ 2/9  Decreased Interest 0 0 0 0 0  Down, Depressed, Hopeless 1 1 0 0 0  PHQ - 2 Score 1 1 0 0 0  Altered sleeping 1 2 2 1 1   Tired, decreased energy 2 2 1  0 0  Change in appetite 0 1 0 0 0  Feeling bad or failure about yourself  1 0 0 0 0  Trouble concentrating 0 0 0 0 0  Moving slowly or fidgety/restless 0 0 0 0 0  Suicidal thoughts 0 0 0 0 0  PHQ-9 Score 5 6 3 1  1    Difficult doing work/chores Somewhat difficult Not difficult at all Not difficult at all Not difficult at all Not difficult at all     Data saved with a previous flowsheet row definition      01/05/2024   10:22 AM 11/17/2023   10:24 AM 11/16/2023    9:44 AM 10/20/2023   10:21 AM  GAD 7 : Generalized Anxiety Score  Nervous, Anxious, on Edge 1 2 0 1  Control/stop worrying 2 2 2 1   Worry too much - different things 1 1 2  0  Trouble relaxing 1 1 1  0  Restless 0 0 0 0  Easily annoyed or irritable 0 0 0 0  Afraid - awful might happen 1 1 0 0  Total GAD 7 Score 6 7 5 2   Anxiety Difficulty Somewhat difficult Somewhat difficult  Not difficult at all Not difficult at all     Suicidal/Homicidal: Nowithout intent/plan  Therapist Response:  Clinician actively greeted patient upon presenting for today's visit, assessing presenting moods and affect, engaging in introductory check-in. Utilized open ended questions in eliciting recounts of events of the past month, exploring presenting stressors and challenges surrounding physical health complications, utilizing active listening in supporting pt's recounts of challenges. Engaged in reassessing of presenting depressive and anxious  sxs via PHQ-9 and GAD-7, exploring variances in sxs in relation to presenting stressors. Engaged in annual updated/revision of tx plan. Clinician utilized CBT, psycho-ed, and solution focused interventions to support pt in navigating identified challenges.  [x]  Cognitive Challenging []  Cognitive Refocusing [x]  Cognitive Reframing  []  Communication Skills []  Compliance Issues []  DBT [x]  Exploration of Coping Patterns [x]  Exploration of Emotions []  Exploration of Relationship Patterns []  Guided Imagery []  Interactive Feedback []  Interpersonal Resolutions []  Mindfulness Training []  Preventative Services [x]  Psycho-Education  []  Relaxation/Deep Breathing [x]  Review of Treatment Plan/Progress []  Role-Play/Behavioral Rehearsal  []  Structured Problem Solving [x]  Supportive Reflection []  Symptom Management  []  Other  Patient responded well to interventions. Patient continues to meet criteria for MDD, GAD, and PTSD. Patient will continue to benefit from engagement in outpatient therapy due to being the least restrictive service to meet presenting needs. Pt proves to maintain moderate progress towards identified goals.  Plan: Return again in 3 week.  Diagnosis:  Encounter Diagnoses  Name Primary?   Generalized anxiety disorder Yes   MDD (major depressive disorder), recurrent episode, moderate (HCC)      Collaboration of Care: Psychiatrist AEB provider notes available in EHR.  Patient/Guardian was advised Release of Information must be obtained prior to any record release in order to collaborate their care with an outside provider. Patient/Guardian was advised if they have not already done so to contact the registration department to sign all necessary forms in order for us  to release information regarding their care.   Consent: Patient/Guardian gives verbal consent for treatment and assignment of benefits for services provided during this visit. Patient/Guardian expressed understanding and agreed  to proceed.   Virtual Visit via Video Note  I connected with Jocie L Ascher on 01/05/2024 at 10:00 AM EST by a video enabled telemedicine application and verified that I am speaking with the correct person using two identifiers.  Location: Patient: Home Provider: Home Office   I discussed the limitations of evaluation and management by telemedicine and the availability of in person appointments. The patient expressed understanding and agreed to proceed.  I discussed the assessment and treatment plan with the patient. The patient was provided an opportunity to ask questions and all were answered. The patient agreed with the plan and demonstrated an understanding of the instructions.   The patient was advised to call back or seek an in-person evaluation if the symptoms worsen or if the condition fails to improve as anticipated.  I provided 42 minutes of non-face-to-face time during this encounter.   [name], MSW, LCSW 01/05/2024,  10:25 AM

## 2024-01-14 NOTE — Progress Notes (Incomplete)
 "              Jill Glenn    [id number]    04-01-1965  Primary Care Physician:Jones, Debby LITTIE, MD Date of Appointment: 01/14/2024 Established Patient Visit  Chief complaint:   No chief complaint on file.    HPI: Jill Glenn is a 59 y.o. woman with COPD, very severe FEV1 24% of predicted, on trelegy. She did all 13 weeks of pulmonary rehab, finished in feb 2024. Did really well, had dramatic improvement. She is on 2-3LNC with POC Since May 2025. Quit smoking October 2023.   Interval Updates: Here for acute visit for COPD exacerbation.   5 days of shortness of breath, cough, wheezing. Her grandson had Flu A in the last week. Her granddaughter and grandson had strep throat. She has not taken any home tests.   No fevers Decreased appetite.   Baseline oxygen  2LNC with POC. She is still on this but occasionally bumps up to 3.   Current regimen Trelegy 1 puff once daily Albuterol  use is 2-4 times/day and nebulizer machine at least daily. This is up from her baseline.  She is jittery and dyspneic.  I have reviewed the patient's family social and past medical history and updated as appropriate.   11/15/23  Past Medical History:  Diagnosis Date   Allergy     seasonal   Anxiety    Asthma    COPD (chronic obstructive pulmonary disease) (HCC)    Depression    Emphysema of lung (HCC)    PTSD (post-traumatic stress disorder)     Past Surgical History:  Procedure Laterality Date   ABDOMINAL HYSTERECTOMY     BREAST EXCISIONAL BIOPSY     moles     mx 2 removed   TONSILLECTOMY      Family History  Problem Relation Age of Onset   Migraines Mother    COPD Mother    Lung cancer Mother    Bipolar disorder Mother    Melanoma Father    Colon polyps Father    Alcohol abuse Father    Melanoma Paternal Aunt    Colon cancer Neg Hx    Crohn's disease Neg Hx    Esophageal cancer Neg Hx    Rectal cancer Neg Hx    Stomach cancer Neg Hx    Ulcerative colitis Neg Hx      Social History   Occupational History   Occupation: audiological scientist  Tobacco Use   Smoking status: Former    Current packs/day: 0.00    Average packs/day: 1 pack/day for 40.6 years (40.6 ttl pk-yrs)    Types: Cigarettes    Start date: 03/05/1981    Quit date: 10/14/2021    Years since quitting: 2.2   Smokeless tobacco: Never  Vaping Use   Vaping status: Never Used  Substance and Sexual Activity   Alcohol use: Yes    Comment: rarely - approx. 1 beer/month   Drug use: No   Sexual activity: Yes    Partners: Male     Physical Exam: There were no vitals taken for this visit.  Gen:   On nasal cannula poc, appears acutely ill, short of breath Lungs:   tachypnic, decreased air entry bilaterally, no wheeze CV:      RRR no mrg Able to speak in full sentences   Data Reviewed: Imaging: I have personally reviewed the CT Chest March 2025 which shows stable LUL nodule, extensive bilateral upper lobe predominant emphysema. Formal radiology interpretation  pending.   ABG 05/23/23 shows pH 7.49, PCO2 32, PO2 82  PFTs:     Latest Ref Rng & Units 10/15/2021    8:50 AM  PFT Results  FVC-Pre L 1.58   FVC-Predicted Pre % 47   FVC-Post L 1.92   FVC-Predicted Post % 58   Pre FEV1/FVC % % 40   Post FEV1/FCV % % 41   FEV1-Pre L 0.64   FEV1-Predicted Pre % 24   FEV1-Post L 0.79   DLCO uncorrected ml/min/mmHg 11.71   DLCO UNC% % 58   DLCO corrected ml/min/mmHg 11.71   DLCO COR %Predicted % 58   DLVA Predicted % 67   TLC L 6.59   TLC % Predicted % 134   RV % Predicted % 253    I have personally reviewed the patient's PFTs and very severe COPD FEv1  Labs:  Lab Results  Component Value Date   NA 138 05/16/2023   K 3.8 05/16/2023   CO2 28 05/16/2023   GLUCOSE 94 05/16/2023   BUN 17 05/16/2023   CREATININE 0.84 05/16/2023   CALCIUM  9.7 05/16/2023   GFR 77.26 05/16/2023   GFRNONAA >60 11/08/2022   Lab Results  Component Value Date   WBC 9.3 05/16/2023   HGB 13.5 05/16/2023    HCT 41.1 05/16/2023   MCV 86.4 05/16/2023   PLT 379.0 05/16/2023    Immunization status: Immunization History  Administered Date(s) Administered   Hepb-cpg 11/16/2022   INFLUENZA, HIGH DOSE SEASONAL PF 10/18/2023   Influenza, Seasonal, Injecte, Preservative Fre 11/16/2022   Influenza,inj,Quad PF,6+ Mos 12/21/2020, 10/14/2021   PNEUMOCOCCAL CONJUGATE-20 12/21/2020   Tdap 03/22/2021   Zoster Recombinant(Shingrix ) 03/22/2021, 04/12/2022   test October 2024 at [facility] Patient walked 10 laps 0 partials 329 meters/1080 feet- 66% on Room Air     Assessment:  Very Severe COPD FEV 1 24% - transplant candidate evaluation at [facility] in process Seasonal allergic rhinitis, controlled History of tobacco use disorder - last smoked October 2023 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  We did a rapid flu test today which was negative.  Antibiotic sent to pharmacy.   Steroid injection in the office followed by prednisone  taper, sent to pharmacy  Continue Trelegy. Continue albuterol  nebs and/or inhaler up to 6 times/day as needed.  Go to the hospital for increasing oxygen  requirements and unable to stay over 90% with your POC, unable to catch your breath, albuterol  no longer helping.   Continue xyzal  and singulair  for allergies.   Keep up acid reflux medication.    Nebulizers??    Return to Care: Keep your appointment with Dr. Adrien in January.    Verdon Gore, MD Pulmonary and Critical Care Medicine [facility] [phone]      "

## 2024-01-15 ENCOUNTER — Ambulatory Visit

## 2024-01-15 VITALS — BP 116/81 | HR 66 | Temp 97.7°F | Ht 63.0 in | Wt 168.4 lb

## 2024-01-15 DIAGNOSIS — D7219 Other eosinophilia: Secondary | ICD-10-CM | POA: Diagnosis not present

## 2024-01-15 DIAGNOSIS — J449 Chronic obstructive pulmonary disease, unspecified: Secondary | ICD-10-CM | POA: Diagnosis not present

## 2024-01-15 DIAGNOSIS — J9611 Chronic respiratory failure with hypoxia: Secondary | ICD-10-CM

## 2024-01-15 DIAGNOSIS — Z87891 Personal history of nicotine dependence: Secondary | ICD-10-CM

## 2024-01-15 DIAGNOSIS — J4489 Other specified chronic obstructive pulmonary disease: Secondary | ICD-10-CM

## 2024-01-15 DIAGNOSIS — R911 Solitary pulmonary nodule: Secondary | ICD-10-CM | POA: Diagnosis not present

## 2024-01-15 DIAGNOSIS — R918 Other nonspecific abnormal finding of lung field: Secondary | ICD-10-CM

## 2024-01-15 DIAGNOSIS — J441 Chronic obstructive pulmonary disease with (acute) exacerbation: Secondary | ICD-10-CM

## 2024-01-15 DIAGNOSIS — J3089 Other allergic rhinitis: Secondary | ICD-10-CM

## 2024-01-15 MED ORDER — ALBUTEROL SULFATE HFA 108 (90 BASE) MCG/ACT IN AERS: 2.0000 | INHALATION_SPRAY | Freq: Four times a day (QID) | RESPIRATORY_TRACT | 10 refills | Status: AC | PRN

## 2024-01-15 MED ORDER — TRELEGY ELLIPTA 200-62.5-25 MCG/ACT IN AEPB: 1.0000 | INHALATION_SPRAY | Freq: Every day | RESPIRATORY_TRACT | 12 refills | Status: AC

## 2024-01-15 NOTE — Patient Instructions (Addendum)
 Dear Ms. Samuella;  I am glad your are recovering from a flare.  I refilled the inhalers and nebulizer medications. I will repeat a CT scan in February to follow up nodules. I will see you in 3 months.

## 2024-01-19 ENCOUNTER — Encounter (HOSPITAL_COMMUNITY): Payer: Self-pay

## 2024-01-22 ENCOUNTER — Ambulatory Visit (HOSPITAL_BASED_OUTPATIENT_CLINIC_OR_DEPARTMENT_OTHER)
Admission: RE | Admit: 2024-01-22 | Discharge: 2024-01-22 | Disposition: A | Discharge status: Home / Self Care | Source: Ambulatory Visit

## 2024-01-22 DIAGNOSIS — R918 Other nonspecific abnormal finding of lung field: Secondary | ICD-10-CM | POA: Insufficient documentation

## 2024-01-24 ENCOUNTER — Encounter (HOSPITAL_COMMUNITY): Payer: Self-pay

## 2024-01-24 ENCOUNTER — Ambulatory Visit (INDEPENDENT_AMBULATORY_CARE_PROVIDER_SITE_OTHER): Admitting: Licensed Clinical Social Worker

## 2024-01-24 DIAGNOSIS — Z91199 Patient's noncompliance with other medical treatment and regimen due to unspecified reason: Secondary | ICD-10-CM

## 2024-01-24 NOTE — Progress Notes (Signed)
 THERAPIST PROGRESS NOTE   Session Date: 01/24/2024  Session Time: 1300  Jill Glenn    Clinician attempted to connect with patient for scheduled appointment via [facility] video, sending text request x3 with no response.     Attempt 1: Text: 1310   Attempt 2: Text: 1312    Attempt 3: Text: 1314   Disconnected video visit at:  1316     Per [facility] policy, after multiple attempts to reach patient unsuccessfully at appointed time, visit will be coded as a no show.  [name], MSW, LCSW 01/24/2024,  1:20 PM

## 2024-01-26 ENCOUNTER — Encounter: Payer: Self-pay | Admitting: Adult Health

## 2024-01-26 ENCOUNTER — Ambulatory Visit: Admitting: Adult Health

## 2024-01-26 VITALS — BP 108/71 | HR 61 | Ht 63.0 in | Wt 169.0 lb

## 2024-01-26 DIAGNOSIS — G43009 Migraine without aura, not intractable, without status migrainosus: Secondary | ICD-10-CM | POA: Diagnosis not present

## 2024-01-26 DIAGNOSIS — R519 Headache, unspecified: Secondary | ICD-10-CM
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
## 2024-01-31 ENCOUNTER — Ambulatory Visit: Payer: Self-pay

## 2024-01-31 ENCOUNTER — Telehealth: Payer: Self-pay | Admitting: Adult Health

## 2024-01-31 DIAGNOSIS — R918 Other nonspecific abnormal finding of lung field: Secondary | ICD-10-CM

## 2024-01-31 NOTE — Telephone Encounter (Signed)
 MRI order sent to [facility], they obtain [facility]. [phone]

## 2024-01-31 NOTE — Telephone Encounter (Unsigned)
 Copied from CRM [id number]. Topic: Clinical - Lab/Test Results >> Jan 30, 2024 10:32 AM Ismael A wrote: Reason for CRM: Glade from [facility] called in to provide results for a CT scan - she is requesting a call back to go over the results -  ph# [phone]   Dr Adrien has already reviewed results and d/w pt via mychart.

## 2024-01-31 NOTE — Telephone Encounter (Signed)
 I am ordering a CT chest in 3 months for multiple nodules f/u.

## 2024-02-05 ENCOUNTER — Telehealth: Payer: Self-pay | Admitting: *Deleted

## 2024-02-13 ENCOUNTER — Ambulatory Visit (HOSPITAL_COMMUNITY): Admitting: Licensed Clinical Social Worker

## 2024-02-19 ENCOUNTER — Ambulatory Visit: Admitting: Internal Medicine

## 2024-02-24 ENCOUNTER — Other Ambulatory Visit

## 2024-03-08 ENCOUNTER — Ambulatory Visit (HOSPITAL_COMMUNITY): Admitting: Licensed Clinical Social Worker

## 2024-03-15 ENCOUNTER — Telehealth (HOSPITAL_COMMUNITY): Payer: Self-pay | Admitting: Student

## 2024-04-22 ENCOUNTER — Ambulatory Visit
